# Patient Record
Sex: Male | Born: 1950 | Race: Black or African American | Hispanic: No | Marital: Married | Smoking: Never smoker
Health system: Southern US, Community
[De-identification: ages and names within clinical notes are randomized; demographics above are authoritative.]

## PROBLEM LIST (undated history)

## (undated) DIAGNOSIS — K219 Gastro-esophageal reflux disease without esophagitis: Secondary | ICD-10-CM

## (undated) DIAGNOSIS — D649 Anemia, unspecified: Secondary | ICD-10-CM

## (undated) DIAGNOSIS — I471 Supraventricular tachycardia, unspecified: Secondary | ICD-10-CM

## (undated) DIAGNOSIS — R7303 Prediabetes: Secondary | ICD-10-CM

## (undated) DIAGNOSIS — K311 Adult hypertrophic pyloric stenosis: Secondary | ICD-10-CM

## (undated) DIAGNOSIS — C801 Malignant (primary) neoplasm, unspecified: Secondary | ICD-10-CM

## (undated) HISTORY — PX: COLONOSCOPY: SHX174

---

## 1980-11-14 HISTORY — PX: OTHER SURGICAL HISTORY: SHX169

## 2002-01-12 ENCOUNTER — Encounter: Payer: Self-pay | Admitting: Emergency Medicine

## 2002-01-12 ENCOUNTER — Emergency Department (HOSPITAL_COMMUNITY): Admission: EM | Admit: 2002-01-12 | Discharge: 2002-01-12 | Payer: Self-pay | Admitting: Emergency Medicine

## 2002-06-05 ENCOUNTER — Ambulatory Visit (HOSPITAL_COMMUNITY): Admission: RE | Admit: 2002-06-05 | Discharge: 2002-06-05 | Payer: Self-pay | Admitting: General Surgery

## 2006-10-04 ENCOUNTER — Emergency Department (HOSPITAL_COMMUNITY): Admission: EM | Admit: 2006-10-04 | Discharge: 2006-10-04 | Payer: Self-pay | Admitting: Emergency Medicine

## 2007-04-16 ENCOUNTER — Ambulatory Visit (HOSPITAL_COMMUNITY): Admission: RE | Admit: 2007-04-16 | Discharge: 2007-04-16 | Payer: Self-pay | Admitting: Family Medicine

## 2011-04-01 NOTE — H&P (Signed)
[facility]  Patient:    [name], [name] Visit Number: [id number] MRN: [id number]          Service Type: Attending:  Elpidio Anis, M.D. Dictated by:   Elpidio Anis, M.D.                           History and Physical  HISTORY OF PRESENT ILLNESS:  A 60 year old male returning for screening colonoscopy.  He had no GI symptoms except nocturnal indigestion.  There is no nausea or vomiting.  He has not had any constipation.  PAST MEDICAL HISTORY:  Negative except for BPH.  REVIEW OF SYSTEMS:  Completely normal except for obesity.  FAMILY HISTORY:  Positive for prostate cancer.  CHRONIC MEDICATIONS:  None.  ALLERGIES:  None.  PHYSICAL EXAMINATION:  GENERAL:  He is an obese male in no acute distress.  VITAL SIGNS:  Blood pressure 128/84, pulse 80, respirations 18.  Weight 315 pounds.  HEENT:  Unremarkable.  NECK:  Supple without JVD or bruit.  CHEST:  Clear to auscultation.  BREASTS:  Mild bilateral gynecomastia.  ABDOMEN:  Obese, soft, nontender, no masses.  RECTAL:  Normal.  Stool guaiac negative.  EXTREMITIES:  No cyanosis, clubbing, or edema.  NEUROLOGIC:  No motor, sensory, or cerebellar deficits.  IMPRESSION:  Need for screening colonoscopy. Dictated by:   Elpidio Anis, M.D. Attending:  Elpidio Anis, M.D. DD:  06/04/02 TD:  06/04/02 Job: [id number]

## 2011-09-06 ENCOUNTER — Other Ambulatory Visit: Payer: Self-pay

## 2011-09-06 ENCOUNTER — Telehealth: Payer: Self-pay

## 2011-09-06 DIAGNOSIS — Z139 Encounter for screening, unspecified: Secondary | ICD-10-CM

## 2011-09-06 NOTE — Telephone Encounter (Signed)
Gastroenterology Pre-Procedure Form  Request Date: 09/06/2011      Requesting Physician: Dr. Mirna Mires      PATIENT INFORMATION:  [name] is a 60 y.o., male (DOB=July 28, 1951).  PROCEDURE: Procedure(s) requested: colonoscopy Procedure Reason: screening for colon cancer  PATIENT REVIEW QUESTIONS: The patient reports the following:   1. Diabetes Melitis: no 2. Joint replacements in the past 12 months: no 3. Major health problems in the past 3 months: no 4. Has an artificial valve or MVP:no 5. Has been advised in past to take antibiotics in advance of a procedure like teeth cleaning: no}    MEDICATIONS & ALLERGIES:    Patient reports the following regarding taking any blood thinners:   Plavix? no Aspirin?yes  Coumadin?  no  Patient confirms/reports the following medications:  Current Outpatient Prescriptions  Medication Sig Dispense Refill  . acetaminophen (TYLENOL) 325 MG tablet Take 650 mg by mouth every 6 (six) hours as needed. As needed, usually not over once a week for joint pain       . aspirin 81 MG tablet Take 81 mg by mouth daily.        [name] Kitchen ibuprofen (ADVIL,MOTRIN) 200 MG tablet Take 200 mg by mouth every 6 (six) hours as needed. As needed, usually not over once a week for joint pain         Patient confirms/reports the following allergies:  No Known Allergies  Patient is appropriate to schedule for requested procedure(s): yes  AUTHORIZATION INFORMATION Primary Insurance:   ID #:   Group #:  Pre-Cert / Auth required:  Pre-Cert / Auth #:   Secondary Insurance:   ID #:  Group #:  Pre-Cert / Auth required: Pre-Cert / Auth #:   No orders of the defined types were placed in this encounter.    SCHEDULE INFORMATION: Procedure has been scheduled as follows:  Date: 10/11/2011      Time: 10:30 AM  Location: [facility] Short Stay  This Gastroenterology Pre-Precedure Form is being routed to the following provider(s) for review: R. Roetta Sessions,  MD

## 2011-09-06 NOTE — Telephone Encounter (Signed)
Ok for colonoscopy.  ?

## 2011-09-07 NOTE — Telephone Encounter (Signed)
Rx and instructions mailed to pt.  

## 2011-09-26 ENCOUNTER — Telehealth: Payer: Self-pay

## 2011-09-26 NOTE — Telephone Encounter (Signed)
Tried to call pt to update triage prior to colonoscopy on 10/11/2011. Tried the [phone] and the [phone]. Neither numbers are working. Mailing a letter to call and update triage.

## 2011-10-03 ENCOUNTER — Telehealth: Payer: Self-pay

## 2011-10-03 NOTE — Telephone Encounter (Signed)
OK to proceed with colonoscopy.

## 2011-10-03 NOTE — Telephone Encounter (Signed)
Called pt to update triage. No new medical problems, and no new medications. He had not received the Rx and instructions in the mail. Faxed Rx and prescriptions to [facility].

## 2011-10-10 ENCOUNTER — Encounter (HOSPITAL_COMMUNITY): Payer: Self-pay | Admitting: Pharmacy Technician

## 2011-10-10 MED ORDER — SODIUM CHLORIDE 0.45 % IV SOLN
Freq: Once | INTRAVENOUS | Status: AC
  Administered 2011-10-11: 10:00:00 via INTRAVENOUS

## 2011-10-11 ENCOUNTER — Ambulatory Visit (HOSPITAL_COMMUNITY)
Admission: RE | Admit: 2011-10-11 | Discharge: 2011-10-11 | Disposition: A | Discharge status: Home / Self Care | Payer: BC Managed Care – PPO | Source: Ambulatory Visit | Attending: Internal Medicine | Admitting: Internal Medicine

## 2011-10-11 ENCOUNTER — Other Ambulatory Visit: Payer: Self-pay | Admitting: Internal Medicine

## 2011-10-11 ENCOUNTER — Encounter (HOSPITAL_COMMUNITY): Payer: Self-pay | Admitting: *Deleted

## 2011-10-11 ENCOUNTER — Encounter (HOSPITAL_COMMUNITY)
Admission: RE | Disposition: A | Discharge status: Home / Self Care | Payer: Self-pay | Source: Ambulatory Visit | Attending: Internal Medicine

## 2011-10-11 DIAGNOSIS — D126 Benign neoplasm of colon, unspecified: Secondary | ICD-10-CM

## 2011-10-11 DIAGNOSIS — K5730 Diverticulosis of large intestine without perforation or abscess without bleeding: Secondary | ICD-10-CM

## 2011-10-11 DIAGNOSIS — Z7982 Long term (current) use of aspirin: Secondary | ICD-10-CM | POA: Insufficient documentation

## 2011-10-11 DIAGNOSIS — Z1211 Encounter for screening for malignant neoplasm of colon: Secondary | ICD-10-CM | POA: Insufficient documentation

## 2011-10-11 DIAGNOSIS — Z139 Encounter for screening, unspecified: Secondary | ICD-10-CM

## 2011-10-11 HISTORY — PX: COLONOSCOPY: SHX5424

## 2011-10-11 SURGERY — COLONOSCOPY
Anesthesia: Moderate Sedation

## 2011-10-11 MED ORDER — MIDAZOLAM HCL 5 MG/5ML IJ SOLN
INTRAMUSCULAR | Status: DC | PRN
  Administered 2011-10-11 (×2): 2 mg via INTRAVENOUS
  Administered 2011-10-11: 1 mg via INTRAVENOUS

## 2011-10-11 MED ORDER — MEPERIDINE HCL 100 MG/ML IJ SOLN
INTRAMUSCULAR | Status: AC
  Filled 2011-10-11: qty 2

## 2011-10-11 MED ORDER — MEPERIDINE HCL 100 MG/ML IJ SOLN
INTRAMUSCULAR | Status: DC | PRN
  Administered 2011-10-11: 25 mg via INTRAVENOUS
  Administered 2011-10-11: 50 mg via INTRAVENOUS

## 2011-10-11 MED ORDER — STERILE WATER FOR IRRIGATION IR SOLN
Status: DC | PRN
  Administered 2011-10-11: 11:00:00

## 2011-10-11 MED ORDER — MIDAZOLAM HCL 5 MG/5ML IJ SOLN
INTRAMUSCULAR | Status: AC
  Filled 2011-10-11: qty 10

## 2011-10-11 NOTE — H&P (Signed)
  Primary Care Physician:  No primary provider on file. Primary Gastroenterologist:  Dr.   Pre-Procedure History & Physical: HPI:  [name] is a 60 y.o. male is here for a screening colonoscopy. Reported negative colonoscopy 10 years ago. No family history of colon cancer or colon polyps. No lower GI tract symptoms.  History reviewed. No pertinent past medical history.  Past Surgical History  Procedure Date  . Testicle removed 1982  . Colonoscopy     Prior to Admission medications   Medication Sig Start Date End Date Taking? Authorizing Provider  acetaminophen (TYLENOL) 325 MG tablet Take 650 mg by mouth every 6 (six) hours as needed. As needed, usually not over once a week for joint pain    Yes Historical Provider, MD  aspirin EC 81 MG tablet Take 81 mg by mouth daily.     Yes Historical Provider, MD  ibuprofen (ADVIL,MOTRIN) 200 MG tablet Take 200 mg by mouth every 6 (six) hours as needed. As needed, usually not over once a week for joint pain    Yes Historical Provider, MD  Multiple Vitamins-Minerals (MULTIVITAMINS THER. W/MINERALS) TABS Take 1 tablet by mouth daily.     Yes Historical Provider, MD    Allergies as of 09/06/2011  . (No Known Allergies)    Family History  Problem Relation Age of Onset  . Colon cancer Neg Hx     History   Social History  . Marital Status: Married    Spouse Name: N/A    Number of Children: N/A  . Years of Education: N/A   Occupational History  . Not on file.   Social History Main Topics  . Smoking status: Never Smoker   . Smokeless tobacco: Not on file  . Alcohol Use: 2.4 oz/week    2 Glasses of wine, 2 Cans of beer per week  . Drug Use: No  . Sexually Active:    Other Topics Concern  . Not on file   Social History Narrative  . No narrative on file    Review of Systems: See HPI, otherwise negative ROS  Physical Exam: BP 135/75  Pulse 76  Temp(Src) 98.2 F (36.8 C) (Oral)  Resp 18  Ht 6' (1.829 m)  Wt 300 lb  (136.079 kg)  BMI 40.69 kg/m2  SpO2 98% General:   Alert,  Well-developed, well-nourished, pleasant and cooperative in NAD Mouth:  No deformity or lesions, dentition normal. Neck:  Supple; no masses or thyromegaly. Lungs:  Clear throughout to auscultation.   No wheezes, crackles, or rhonchi. No acute distress. Heart:  Regular rate and rhythm; no murmurs, clicks, rubs,  or gallops. Abdomen:  Obese. Positive bowel sounds. Soft and nontender without appreciable mass or organomegaly. Impression/Plan: [name] is now here to undergo a screening colonoscopy.   Average risk examination.  Risks, benefits, limitations, imponderables and alternatives regarding colonoscopy have been reviewed with the patient. Questions have been answered. All parties agreeable.

## 2011-10-20 ENCOUNTER — Encounter (HOSPITAL_COMMUNITY): Payer: Self-pay | Admitting: Internal Medicine

## 2016-03-01 ENCOUNTER — Ambulatory Visit (HOSPITAL_COMMUNITY)
Admission: RE | Admit: 2016-03-01 | Discharge: 2016-03-01 | Disposition: A | Discharge status: Home / Self Care | Payer: BLUE CROSS/BLUE SHIELD | Source: Ambulatory Visit | Attending: Family Medicine | Admitting: Family Medicine

## 2016-03-01 ENCOUNTER — Other Ambulatory Visit (HOSPITAL_COMMUNITY): Payer: Self-pay | Admitting: Family Medicine

## 2016-03-01 DIAGNOSIS — R059 Cough, unspecified: Secondary | ICD-10-CM

## 2016-03-01 DIAGNOSIS — J188 Other pneumonia, unspecified organism: Secondary | ICD-10-CM

## 2016-03-01 DIAGNOSIS — Z029 Encounter for administrative examinations, unspecified: Secondary | ICD-10-CM | POA: Insufficient documentation

## 2016-03-01 DIAGNOSIS — R05 Cough: Secondary | ICD-10-CM

## 2016-12-19 ENCOUNTER — Ambulatory Visit: Payer: BLUE CROSS/BLUE SHIELD | Admitting: Gastroenterology

## 2016-12-26 ENCOUNTER — Other Ambulatory Visit: Payer: Self-pay | Admitting: Gastroenterology

## 2016-12-26 ENCOUNTER — Ambulatory Visit
Admission: RE | Admit: 2016-12-26 | Discharge: 2016-12-26 | Disposition: A | Discharge status: Home / Self Care | Payer: BLUE CROSS/BLUE SHIELD | Source: Ambulatory Visit | Attending: Gastroenterology | Admitting: Gastroenterology

## 2016-12-26 DIAGNOSIS — R1013 Epigastric pain: Secondary | ICD-10-CM

## 2016-12-26 DIAGNOSIS — R634 Abnormal weight loss: Secondary | ICD-10-CM

## 2016-12-26 MED ORDER — IOPAMIDOL (ISOVUE-300) INJECTION 61%
125.0000 mL | Freq: Once | INTRAVENOUS | Status: AC | PRN
  Administered 2016-12-26: 125 mL via INTRAVENOUS

## 2016-12-27 ENCOUNTER — Other Ambulatory Visit: Payer: Self-pay | Admitting: Gastroenterology

## 2016-12-28 ENCOUNTER — Encounter (HOSPITAL_COMMUNITY): Payer: Self-pay | Admitting: *Deleted

## 2016-12-30 ENCOUNTER — Ambulatory Visit (HOSPITAL_COMMUNITY)
Admission: RE | Admit: 2016-12-30 | Discharge: 2016-12-30 | Disposition: A | Discharge status: Home / Self Care | Payer: BLUE CROSS/BLUE SHIELD | Source: Ambulatory Visit | Attending: Gastroenterology | Admitting: Gastroenterology

## 2016-12-30 ENCOUNTER — Encounter (HOSPITAL_COMMUNITY): Payer: Self-pay

## 2016-12-30 ENCOUNTER — Encounter (HOSPITAL_COMMUNITY)
Admission: RE | Disposition: A | Discharge status: Home / Self Care | Payer: Self-pay | Source: Ambulatory Visit | Attending: Gastroenterology

## 2016-12-30 ENCOUNTER — Ambulatory Visit (HOSPITAL_COMMUNITY): Payer: BLUE CROSS/BLUE SHIELD | Admitting: Anesthesiology

## 2016-12-30 DIAGNOSIS — Z6839 Body mass index (BMI) 39.0-39.9, adult: Secondary | ICD-10-CM | POA: Diagnosis not present

## 2016-12-30 DIAGNOSIS — E119 Type 2 diabetes mellitus without complications: Secondary | ICD-10-CM | POA: Diagnosis not present

## 2016-12-30 DIAGNOSIS — C164 Malignant neoplasm of pylorus: Secondary | ICD-10-CM | POA: Diagnosis not present

## 2016-12-30 DIAGNOSIS — Z79899 Other long term (current) drug therapy: Secondary | ICD-10-CM | POA: Insufficient documentation

## 2016-12-30 DIAGNOSIS — Z7982 Long term (current) use of aspirin: Secondary | ICD-10-CM | POA: Insufficient documentation

## 2016-12-30 DIAGNOSIS — K219 Gastro-esophageal reflux disease without esophagitis: Secondary | ICD-10-CM | POA: Diagnosis not present

## 2016-12-30 DIAGNOSIS — E669 Obesity, unspecified: Secondary | ICD-10-CM | POA: Insufficient documentation

## 2016-12-30 DIAGNOSIS — K311 Adult hypertrophic pyloric stenosis: Secondary | ICD-10-CM | POA: Insufficient documentation

## 2016-12-30 DIAGNOSIS — K259 Gastric ulcer, unspecified as acute or chronic, without hemorrhage or perforation: Secondary | ICD-10-CM | POA: Insufficient documentation

## 2016-12-30 HISTORY — PX: ESOPHAGOGASTRODUODENOSCOPY: SHX5428

## 2016-12-30 LAB — GLUCOSE, CAPILLARY: Glucose-Capillary: 85 mg/dL (ref 65–99)

## 2016-12-30 SURGERY — EGD (ESOPHAGOGASTRODUODENOSCOPY)
Anesthesia: General

## 2016-12-30 MED ORDER — FENTANYL CITRATE (PF) 100 MCG/2ML IJ SOLN
INTRAMUSCULAR | Status: DC | PRN
  Administered 2016-12-30 (×2): 50 ug via INTRAVENOUS

## 2016-12-30 MED ORDER — FENTANYL CITRATE (PF) 100 MCG/2ML IJ SOLN
INTRAMUSCULAR | Status: AC
  Filled 2016-12-30: qty 2

## 2016-12-30 MED ORDER — ONDANSETRON HCL 4 MG/2ML IJ SOLN
INTRAMUSCULAR | Status: DC | PRN
  Administered 2016-12-30: 4 mg via INTRAVENOUS

## 2016-12-30 MED ORDER — PROPOFOL 10 MG/ML IV BOLUS
INTRAVENOUS | Status: DC | PRN
  Administered 2016-12-30: 180 mg via INTRAVENOUS

## 2016-12-30 MED ORDER — PROPOFOL 10 MG/ML IV BOLUS
INTRAVENOUS | Status: AC
  Filled 2016-12-30: qty 20

## 2016-12-30 MED ORDER — SUCCINYLCHOLINE CHLORIDE 200 MG/10ML IV SOSY
PREFILLED_SYRINGE | INTRAVENOUS | Status: DC | PRN
  Administered 2016-12-30: 160 mg via INTRAVENOUS

## 2016-12-30 MED ORDER — LACTATED RINGERS IV SOLN
INTRAVENOUS | Status: DC | PRN
  Administered 2016-12-30: 12:00:00 via INTRAVENOUS

## 2016-12-30 MED ORDER — MIDAZOLAM HCL 2 MG/2ML IJ SOLN
INTRAMUSCULAR | Status: AC
  Filled 2016-12-30: qty 2

## 2016-12-30 MED ORDER — LIDOCAINE 2% (20 MG/ML) 5 ML SYRINGE
INTRAMUSCULAR | Status: DC | PRN
  Administered 2016-12-30: 50 mg via INTRAVENOUS

## 2016-12-30 MED ORDER — MIDAZOLAM HCL 5 MG/5ML IJ SOLN
INTRAMUSCULAR | Status: DC | PRN
  Administered 2016-12-30: 2 mg via INTRAVENOUS

## 2016-12-30 NOTE — Discharge Instructions (Signed)

## 2016-12-30 NOTE — Anesthesia Preprocedure Evaluation (Signed)
Anesthesia Evaluation  Patient identified by MRN, date of birth, ID band Patient awake    Reviewed: Allergy & Precautions, NPO status , Patient's Chart, lab work & pertinent test results  Airway Mallampati: II  TM Distance: >3 FB Neck ROM: Full    Dental  (+) Teeth Intact, Dental Advisory Given   Pulmonary neg pulmonary ROS,    Pulmonary exam normal breath sounds clear to auscultation       Cardiovascular Exercise Tolerance: Good negative cardio ROS Normal cardiovascular exam Rhythm:Regular Rate:Normal     Neuro/Psych negative neurological ROS  negative psych ROS   GI/Hepatic Neg liver ROS, PUD, GERD  Medicated,Gastric outlet obstruction   Endo/Other  diabetes, Type 2Obesity   Renal/GU negative Renal ROS     Musculoskeletal negative musculoskeletal ROS (+)   Abdominal   Peds  Hematology negative hematology ROS (+)   Anesthesia Other Findings Day of surgery medications reviewed with the patient.  Reproductive/Obstetrics                             Anesthesia Physical Anesthesia Plan  ASA: II  Anesthesia Plan: General   Post-op Pain Management:    Induction: Intravenous  Airway Management Planned: Oral ETT  Additional Equipment:   Intra-op Plan:   Post-operative Plan: Extubation in OR  Informed Consent: I have reviewed the patients History and Physical, chart, labs and discussed the procedure including the risks, benefits and alternatives for the proposed anesthesia with the patient or authorized representative who has indicated his/her understanding and acceptance.   Dental advisory given  Plan Discussed with: CRNA and Anesthesiologist  Anesthesia Plan Comments: (Risks/benefits of general anesthesia discussed with patient including risk of damage to teeth, lips, gum, and tongue, nausea/vomiting, allergic reactions to medications, and the possibility of heart attack, stroke  and death.  All patient questions answered.  Patient wishes to proceed.)        Anesthesia Quick Evaluation

## 2016-12-30 NOTE — H&P (Signed)
  [name] HPI: This is a 66 year old male with who has a gastric outlet obstruction.  An EGD was performed earlier this week in the [place] setting and a full stomach was encountered.  A CT scan confirmed the suspicion of a GOO, but the etiology of the GOO was unknown, i.e., malignant versus benign.  History reviewed. No pertinent past medical history.  Past Surgical History:  Procedure Laterality Date  . COLONOSCOPY    . COLONOSCOPY  10/11/2011   Procedure: COLONOSCOPY;  Surgeon: Daneil Dolin, MD;  Location: [facility];  Service: Endoscopy;  Laterality: N/A;  10:30 AM  . testicle removed  1982    Family History  Problem Relation Age of Onset  . Colon cancer Neg Hx     Social History:  reports that he has never smoked. He has never used smokeless tobacco. He reports that he drinks about 2.4 oz of alcohol per week . He reports that he does not use drugs.  Allergies: No Known Allergies  Medications: Scheduled: Continuous:  Results for orders placed or performed during the hospital encounter of 12/30/16 (from the past 24 hour(s))  Glucose, capillary     Status: None   Collection Time: 12/30/16 11:51 AM  Result Value Ref Range   Glucose-Capillary 85 65 - 99 mg/dL     No results found.  ROS:  As stated above in the HPI otherwise negative.  Blood pressure (!) 151/69, pulse 81, temperature 97.5 F (36.4 C), temperature source Oral, resp. rate 16, height 6\' 1"  (1.854 m), weight 136.1 kg (300 lb), SpO2 100 %.    PE: Gen: NAD, Alert and Oriented HEENT:  [place]/AT, EOMI Neck: Supple, no LAD Lungs: CTA Bilaterally CV: RRR without M/G/R ABM: Soft, NTND, +BS Ext: No C/C/E  Assessment/Plan: 1) GOO - EGD with General anesthesia.  Aiman Noe D 12/30/2016, 12:30 PM

## 2016-12-30 NOTE — Anesthesia Procedure Notes (Signed)
Procedure Name: Intubation Date/Time: 12/30/2016 12:30 PM Performed by: Anne Fu Pre-anesthesia Checklist: Patient identified, Emergency Drugs available, Suction available, Patient being monitored and Timeout performed Patient Re-evaluated:Patient Re-evaluated prior to inductionOxygen Delivery Method: Circle system utilized Preoxygenation: Pre-oxygenation with 100% oxygen Intubation Type: Cricoid Pressure applied, Rapid sequence and IV induction Laryngoscope Size: Mac and 4 Grade View: Grade II Tube type: Oral Tube size: 7.5 mm Number of attempts: 1 Airway Equipment and Method: Bougie stylet Placement Confirmation: ETT inserted through vocal cords under direct vision,  positive ETCO2,  CO2 detector and breath sounds checked- equal and bilateral Secured at: 21 cm Tube secured with: Tape Dental Injury: Teeth and Oropharynx as per pre-operative assessment

## 2016-12-30 NOTE — Op Note (Signed)
[facility] Patient Name: [name] Procedure Date: 12/30/2016 MRN: [id number] Attending MD: Carol Ada , MD Date of Birth: 07-23-1951 CSN: [id number] Age: 66 Admit Type: Outpatient Procedure:                Upper GI endoscopy Indications:              Pyloric stenosis Providers:                Carol Ada, MD, Laverta Baltimore RN, RN, Hilma Favors, RN, Elspeth Cho Tech., Technician Referring MD:              Medicines:                Propofol per Anesthesia Complications:            No immediate complications. Estimated Blood Loss:     Estimated blood loss was minimal. Procedure:                Pre-Anesthesia Assessment:                           - Prior to the procedure, a History and Physical                            was performed, and patient medications and                            allergies were reviewed. The patient's tolerance of                            previous anesthesia was also reviewed. The risks                            and benefits of the procedure and the sedation                            options and risks were discussed with the patient.                            All questions were answered, and informed consent                            was obtained. Prior Anticoagulants: The patient has                            taken no previous anticoagulant or antiplatelet                            agents. ASA Grade Assessment: II - A patient with                            mild systemic disease. After reviewing the risks  and benefits, the patient was deemed in                            satisfactory condition to undergo the procedure.                           - Sedation was administered by an anesthesia                            professional. Deep sedation was attained.                           After obtaining informed consent, the endoscope was                            passed  under direct vision. Throughout the                            procedure, the patient's blood pressure, pulse, and                            oxygen saturations were monitored continuously. The                            was introduced through the mouth, and advanced to                            the duodenal bulb. The upper GI endoscopy was                            technically difficult and complex. The patient                            tolerated the procedure well. Scope In: Scope Out: Findings:      The esophagus was normal.      A large amount of food (residue) was found in the gastric body.      A malignant-appearing, intrinsic severe stenosis was found at the       pylorus. This was traversed. Biopsies were taken with a cold forceps for       histology.      The examined duodenum was normal.      At the pylorus there is a high suspicion of a malignant mass. It can       still be severe ulcer disease as there was ulceration noted in the       pyloric channel. The pyloric channel was only traversed with the       pediatric endoscope. The stenosis was severe, but passage of the       endoscope did open up the pylorus. Impression:               - Normal esophagus.                           - A large amount of food (residue) in the stomach.                           -  Gastric stenosis was found at the pylorus.                            Biopsied.                           - Normal examined duodenum. Moderate Sedation:      N/A- Per Anesthesia Care Recommendation:           - Patient has a contact number available for                            emergencies. The signs and symptoms of potential                            delayed complications were discussed with the                            patient. Return to normal activities tomorrow.                            Written discharge instructions were provided to the                            patient.                           -  Clear liquid diet.                           - Continue present medications.                           - Await pathology results.                           - I will discuss with the patient about admission                            to the hospital with an inpatient surgical                            consultation. Procedure Code(s):        --- Professional ---                           [phone], Esophagogastroduodenoscopy, flexible,                            transoral; with biopsy, single or multiple Diagnosis Code(s):        --- Professional ---                           K31.1, Adult hypertrophic pyloric stenosis CPT copyright 2016 [employer]. All rights reserved. The codes documented in this report are preliminary and upon coder review may  be revised to meet current compliance requirements. Carol Ada, MD Carol Ada, MD 12/30/2016 12:54:30  PM This report has been signed electronically. Number of Addenda: 0

## 2016-12-30 NOTE — Transfer of Care (Signed)
Immediate Anesthesia Transfer of Care Note  Patient: [name]  Procedure(s) Performed: Procedure(s): ESOPHAGOGASTRODUODENOSCOPY (EGD) (N/A)  Patient Location: PACU  Anesthesia Type:General  Level of Consciousness:  sedated, patient cooperative and responds to stimulation  Airway & Oxygen Therapy:Patient Spontanous Breathing and Patient connected to face mask oxgen  Post-op Assessment:  Report given to PACU RN and Post -op Vital signs reviewed and stable  Post vital signs:  Reviewed and stable  Last Vitals:  Vitals:   12/30/16 1202  BP: (!) 151/69  Pulse: 81  Resp: 16  Temp: [id number]    Complications: No apparent anesthesia complications

## 2017-01-01 NOTE — Anesthesia Postprocedure Evaluation (Signed)
Anesthesia Post Note  Patient: [name]  Procedure(s) Performed: Procedure(s) (LRB): ESOPHAGOGASTRODUODENOSCOPY (EGD) (N/A)  Patient location during evaluation: Endoscopy Anesthesia Type: General Level of consciousness: awake and alert Pain management: pain level controlled Vital Signs Assessment: post-procedure vital signs reviewed and stable Respiratory status: spontaneous breathing, nonlabored ventilation, respiratory function stable and patient connected to nasal cannula oxygen Cardiovascular status: blood pressure returned to baseline and stable Postop Assessment: no signs of nausea or vomiting Anesthetic complications: no       Last Vitals:  Vitals:   12/30/16 1202 12/30/16 1302  BP: (!) 151/69 (!) 133/57  Pulse: 81 (!) 58  Resp: 16 10  Temp: 36.4 C 36.6 C    Last Pain:  Vitals:   12/30/16 1340  TempSrc: Oral                 Catalina Gravel

## 2017-01-02 ENCOUNTER — Encounter (HOSPITAL_COMMUNITY): Payer: Self-pay | Admitting: Gastroenterology

## 2017-01-04 ENCOUNTER — Other Ambulatory Visit: Payer: Self-pay | Admitting: General Surgery

## 2017-01-06 ENCOUNTER — Telehealth: Payer: Self-pay | Admitting: Hematology

## 2017-01-06 NOTE — Telephone Encounter (Signed)
Appt has been scheduled for the pt to see Dr. Irene Limbo on 2/26 at 830am. Pt aware to arrive 30 minutes early. Demographics verified. Pt agreed to the appt date and time.

## 2017-01-09 ENCOUNTER — Encounter (HOSPITAL_COMMUNITY)
Admission: RE | Admit: 2017-01-09 | Discharge: 2017-01-09 | Disposition: A | Discharge status: Home / Self Care | Payer: BLUE CROSS/BLUE SHIELD | Source: Ambulatory Visit | Attending: General Surgery | Admitting: General Surgery

## 2017-01-09 ENCOUNTER — Ambulatory Visit (HOSPITAL_BASED_OUTPATIENT_CLINIC_OR_DEPARTMENT_OTHER): Payer: BLUE CROSS/BLUE SHIELD | Admitting: Hematology

## 2017-01-09 ENCOUNTER — Encounter: Payer: Self-pay | Admitting: Hematology

## 2017-01-09 ENCOUNTER — Encounter (HOSPITAL_COMMUNITY): Payer: Self-pay

## 2017-01-09 VITALS — BP 131/60 | HR 79 | Temp 98.8°F | Resp 18 | Ht 70.0 in | Wt 270.0 lb

## 2017-01-09 DIAGNOSIS — E440 Moderate protein-calorie malnutrition: Secondary | ICD-10-CM

## 2017-01-09 DIAGNOSIS — C169 Malignant neoplasm of stomach, unspecified: Secondary | ICD-10-CM | POA: Diagnosis not present

## 2017-01-09 DIAGNOSIS — E669 Obesity, unspecified: Secondary | ICD-10-CM

## 2017-01-09 HISTORY — DX: Anemia, unspecified: D64.9

## 2017-01-09 HISTORY — DX: Gastro-esophageal reflux disease without esophagitis: K21.9

## 2017-01-09 HISTORY — DX: Prediabetes: R73.03

## 2017-01-09 LAB — CBC WITH DIFFERENTIAL/PLATELET
Basophils Absolute: 0 10*3/uL (ref 0.0–0.1)
Basophils Relative: 0 %
Eosinophils Absolute: 0.1 10*3/uL (ref 0.0–0.7)
Eosinophils Relative: 1 %
HCT: 37.5 % — ABNORMAL LOW (ref 39.0–52.0)
Hemoglobin: 11.9 g/dL — ABNORMAL LOW (ref 13.0–17.0)
Lymphocytes Relative: 21 %
Lymphs Abs: 1.3 10*3/uL (ref 0.7–4.0)
MCH: 25.5 pg — ABNORMAL LOW (ref 26.0–34.0)
MCHC: 31.7 g/dL (ref 30.0–36.0)
MCV: 80.3 fL (ref 78.0–100.0)
Monocytes Absolute: 0.5 10*3/uL (ref 0.1–1.0)
Monocytes Relative: 8 %
Neutro Abs: 4.3 10*3/uL (ref 1.7–7.7)
Neutrophils Relative %: 70 %
Platelets: 221 10*3/uL (ref 150–400)
RBC: 4.67 MIL/uL (ref 4.22–5.81)
RDW: 15.4 % (ref 11.5–15.5)
WBC: 6.1 10*3/uL (ref 4.0–10.5)

## 2017-01-09 LAB — URINALYSIS, ROUTINE W REFLEX MICROSCOPIC
Bacteria, UA: NONE SEEN
Glucose, UA: NEGATIVE mg/dL
Hgb urine dipstick: NEGATIVE
Ketones, ur: 80 mg/dL — AB
Leukocytes, UA: NEGATIVE
Nitrite: NEGATIVE
Protein, ur: 30 mg/dL — AB
Specific Gravity, Urine: 1.035 — ABNORMAL HIGH (ref 1.005–1.030)
pH: 5 (ref 5.0–8.0)

## 2017-01-09 LAB — COMPREHENSIVE METABOLIC PANEL
ALT: 34 U/L (ref 17–63)
AST: 23 U/L (ref 15–41)
Albumin: 3.8 g/dL (ref 3.5–5.0)
Alkaline Phosphatase: 52 U/L (ref 38–126)
Anion gap: 11 (ref 5–15)
BUN: 12 mg/dL (ref 6–20)
CO2: 24 mmol/L (ref 22–32)
Calcium: 10 mg/dL (ref 8.9–10.3)
Chloride: 102 mmol/L (ref 101–111)
Creatinine, Ser: 1.11 mg/dL (ref 0.61–1.24)
GFR calc Af Amer: >60 mL/min (ref >60)
GFR calc non Af Amer: >60 mL/min (ref >60)
Glucose, Bld: 111 mg/dL — ABNORMAL HIGH (ref 65–99)
Potassium: 3.8 mmol/L (ref 3.5–5.1)
Sodium: 137 mmol/L (ref 135–145)
Total Bilirubin: 1 mg/dL (ref 0.3–1.2)
Total Protein: 7.4 g/dL (ref 6.5–8.1)

## 2017-01-09 LAB — TYPE AND SCREEN
ABO/RH(D): A POS
Antibody Screen: NEGATIVE

## 2017-01-09 LAB — GLUCOSE, CAPILLARY: Glucose-Capillary: 106 mg/dL — ABNORMAL HIGH (ref 65–99)

## 2017-01-09 LAB — PROTIME-INR
INR: 1.16
Prothrombin Time: 14.9 seconds (ref 11.4–15.2)

## 2017-01-09 MED ORDER — DEXTROSE 5 % IV SOLN
3.0000 g | INTRAVENOUS | Status: AC
  Administered 2017-01-10: 3 g via INTRAVENOUS
  Filled 2017-01-09: qty 3000

## 2017-01-09 NOTE — Progress Notes (Signed)
[name] Kitchen    HEMATOLOGY/ONCOLOGY CONSULTATION NOTE  Date of Service: 01/09/2017  Patient Care Team: [name], MD as PCP - General (Internal Medicine) Stark Klein (Surgery) Elisha Headland (Gastroenterology)  CHIEF COMPLAINTS/PURPOSE OF CONSULTATION:  Newly diagnosed gastric adenocarcinoma with partial gastric outlet obstruction.  HISTORY OF PRESENTING ILLNESS:   [name] is a wonderful 66 y.o. male who has been referred to [name] by Dr .[name], MD  for evaluation and management of newly diagnosed gastric adenocarcinoma.  Patient is a history of obesity, shingles, prediabetes but no other significant chronic medical issues who presented with early satiety initially noted around Thanksgiving in 2017. He notes that he developed progressive dyspeptic symptoms, acid reflux and abdominal fullness after meals. He was initially treated with Zantac without any benefits and then treated empirically for Helicobacter pylori infection by his primary care physician. He lost about 60 pounds over the last 2-3 months. Since his symptoms persisted he was eventually referred to gastroenterology and had a CT of the chest abdomen pelvis on 12/26/2016 which showed prominent distention of the stomach with food debris. Mild annular wall thickening in the gastric antrum. No significant perigastric lymphadenopathy noted.  Patient had an EGD on 12/30/2016 which showed malignant appearing intrinsic severe stenosis at the gastric pylorus. This was transversed with a pediatric endoscope. Stenosis was noted to be severe. Biopsy was taken of the ulceration at the pylorus. Biopsy was consistent with adenocarcinoma of the stomach along with marked acute and chronic inflammation. Warthin Starry stain was negative for Helicobacter pylori.  Patient has been scheduled for a distal gastrectomy with feeding tube placement by Dr. Jeris Penta tomorrow on 01/10/2017.  Notes that he is continuing to lose weight at this  time. Abdominal distention was somewhat better after the procedure for a day or 2 but is, in fact again it at has intermittent hiccups.  MEDICAL HISTORY:   1) Obesity .Body mass index is 38.74 kg/m. 2) h/o Shingles 3 yrs ago on chest 3) Borderline diabetes - was previously on metformin   SURGICAL HISTORY: Past Surgical History:  Procedure Laterality Date  . COLONOSCOPY    . COLONOSCOPY  10/11/2011   Procedure: COLONOSCOPY;  Surgeon: Daneil Dolin, MD;  [facility];  Service: Endoscopy;  Laterality: N/A;  10:30 AM  . ESOPHAGOGASTRODUODENOSCOPY N/A 12/30/2016   Procedure: ESOPHAGOGASTRODUODENOSCOPY (EGD);  Surgeon: Carol Ada, MD;  Location: [facility];  Service: Endoscopy;  Laterality: N/A;  . testicle removed  1982    SOCIAL HISTORY: Social History   Social History  . Marital status: Married    Spouse name: N/A  . Number of children: N/A  . Years of education: N/A   Occupational History  . Not on file.   Social History Main Topics  . Smoking status: Never Smoker  . Smokeless tobacco: Never Used  . Alcohol use 2.4 oz/week    2 Glasses of wine, 2 Cans of beer per week  . Drug use: No  . Sexual activity: Not on file   Other Topics Concern  . Not on file   Social History Narrative  . 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 No radiation exposure Previously has lived in [place], [place] and [place].  FAMILY HISTORY: Family History  Problem Relation Age of Onset  . Colon cancer Neg Hx   No family history of blood disorders. Father had prostate cancer and died at  age 32 years.  ALLERGIES:  has No Known Allergies.  MEDICATIONS:  Current Outpatient Prescriptions  Medication Sig Dispense Refill  . ferrous sulfate 325 (65 FE) MG tablet Take 325 mg by mouth daily with breakfast.    . Multiple Vitamins-Minerals (MULTIVITAMINS THER.  W/MINERALS) TABS Take 1 tablet by mouth daily.      . ranitidine (ZANTAC) 150 MG tablet Take 150 mg by mouth daily as needed for heartburn.     No current facility-administered medications for this visit.     REVIEW OF SYSTEMS:    10 Point review of Systems was done is negative except as noted above.  PHYSICAL EXAMINATION: ECOG PERFORMANCE STATUS: 1 - Symptomatic but completely ambulatory  . Vitals:   01/09/17 0854  BP: 131/60  Pulse: 79  Resp: 18  Temp: 98.8 F (37.1 C)   Filed Weights   01/09/17 0854  Weight: 270 lb (122.5 kg)   .Body mass index is 38.74 kg/m.  GENERAL:alert, in no acute distress and comfortable SKIN: skin color, texture, turgor are normal, no rashes or significant lesions EYES: normal, conjunctiva are pink and non-injected, sclera clear OROPHARYNX:no exudate, no erythema and lips, buccal mucosa, and tongue normal  NECK: supple, no JVD, thyroid normal size, non-tender, without nodularity LYMPH:  no palpable lymphadenopathy in the cervical, axillary or inguinal LUNGS: clear to auscultation with normal respiratory effort HEART: regular rate & rhythm,  no murmurs and no lower extremity edema ABDOMEN: abdomen obese, not overtly distended, non-tender, normoactive bowel sounds  Musculoskeletal: no cyanosis of digits and no clubbing  PSYCH: alert & oriented x 3 with fluent speech NEURO: no focal motor/sensory deficits  LABORATORY DATA:  I have reviewed the data as listed    EGD: 12/30/2016 - Normal esophagus. - A large amount of food (residue) in the stomach. - Gastric stenosis was found at the pylorus. Biopsied. - Normal examined duodenum.  RADIOGRAPHIC STUDIES: I have personally reviewed the radiological images as listed and agreed with the findings in the report. Ct Abdomen Pelvis W Contrast  Result Date: 12/26/2016 CLINICAL DATA:  66 year old male with epigastric abdominal pain and abnormal weight loss. Reported incomplete colonoscopy earlier  today. Reported upper endoscopy 11/25/2016. EXAM: CT ABDOMEN AND PELVIS WITH CONTRAST TECHNIQUE: Multidetector CT imaging of the abdomen and pelvis was performed using the standard protocol following bolus administration of intravenous contrast. CONTRAST:  178mL ISOVUE-300 IOPAMIDOL (ISOVUE-300) INJECTION 61% COMPARISON:  None. FINDINGS: Lower chest: No significant pulmonary nodules or acute consolidative airspace disease. Coronary atherosclerosis. Hepatobiliary: Normal liver with no liver mass. Normal gallbladder with no radiopaque cholelithiasis. No biliary ductal dilatation. Pancreas: Normal, with no mass or duct dilation. Spleen: Normal size. No mass. Adrenals/Urinary Tract: Normal adrenals. No hydronephrosis. Simple appearing 1.0 cm renal cyst in the anterior interpolar right kidney. Normal bladder. Stomach/Bowel: There is prominent distention of the stomach with food debris. There is mild annular wall thickening in the gastric antrum (series 3/ image 39). Normal caliber small bowel with no small bowel wall thickening. Normal appendix. Minimal sigmoid diverticulosis. Fluid levels throughout the nondistended large bowel. No large bowel wall thickening or pericolonic fat stranding. Vascular/Lymphatic: Normal caliber abdominal aorta. Patent portal, splenic, hepatic and renal veins. No pathologically enlarged lymph nodes in the abdomen or pelvis. Reproductive: Mildly enlarged prostate. Other: No pneumoperitoneum, ascites or focal fluid collection. Partially visualized postsurgical changes from right orchiectomy. Musculoskeletal: No aggressive appearing focal osseous lesions. Moderate thoracolumbar spondylosis. IMPRESSION: 1. Prominent distention of the stomach with food debris. Mild annular wall  thickening in the gastric antrum, which is highly nonspecific and could be artifactual due to gastric peristalsis, with gastritis or gastric neoplasm not excluded. Recommend correlation with the reported recent upper  endoscopy. 2. Nonspecific fluid levels throughout the nondistended large bowel, potentially related to the reported colonoscopy performed earlier today. No free air. No findings to suggest small or large bowel obstruction or acute small or large bowel inflammation. 3. Coronary atherosclerosis. 4. Mildly enlarged prostate. Electronically Signed   By: Ilona Sorrel M.D.   On: 12/26/2016 15:01    ASSESSMENT & PLAN:   65 year old African-American male with  1) Newly diagnosed Gastric Adenocarcinoma  Presented with severe pyloric stenosis with ulceration. 60 pound weight loss. Patient still has symptomatic pyloric stenosis related to his cancer. Initial CT abdomen and pelvis do not show any local regional lymphadenopathy. No endoscopic ultrasound staging done at this point.  #2 moderate protein calorie malnutrition patient has lost about 60 pounds in the last 2-3 months. Plan -Given the patient's symptomatic pyloric stenosis he has been scheduled for urgent surgery with Dr. Jeris Penta on 01/10/2017 . Likely distal gastrectomy with feeding tube placement. Surgery might also involve local lymph node staging. -Dietitian referral for nutritional optimization after surgery. -Baseline labs including CBC, CMP, ferritin, iron profile, B12 have been requested. Patient prefers to have his preop labs tomorrow and not have his baseline labs in clinic today. -Will need a CT of the chest to complete staging. -Further recommendations for adjuvant chemotherapy plus or minus radiation based on pathology results after surgery/surgical staging.  #3 pre-diabetes previously was on metformin. Blood sugar stable after having lost 60 pounds of weight.  #4 suspected sleep apnea. Has not had a sleep study. Wife notes significant snoring which is important with weight loss.  Will have to be careful with excessive sedation.  #5 obesity - has lost about 60 pounds since his symptoms started in November 2017.  Return to  care with Dr. Irene Limbo with labs in 2-3 weeks after surgery to discuss role for adjuvant treatments.  All of the patients questions were answered with apparent satisfaction. The patient knows to call the clinic with any problems, questions or concerns.  I spent 50 minutes counseling the patient face to face. The total time spent in the appointment was 60 minutes and more than 50% was on counseling and direct patient cares.    Sullivan Lone MD [place] AAHIVMS [facility] Hematology/Oncology Physician [facility]  (Office):       [phone] (Work cell):  [phone] (Fax):           [phone]  01/09/2017 9:06 AM

## 2017-01-09 NOTE — Progress Notes (Signed)
EKG requested from Dr. [name] Osei-Bonsu  Pt.'s PCP

## 2017-01-09 NOTE — Pre-Procedure Instructions (Signed)
[name]  01/09/2017      [id number] - [place], [place] [id number] [place] [address] Phone: [phone] Fax: [phone]    Your procedure is scheduled on 01-10-2017   Tuesday   Report to [facility] Admitting at 5:30 A.M.   Call this number if you have problems the morning of surgery:  [phone]   Remember:  Do not eat food or drink liquids after midnight.   Take these medicines the morning of surgery with A SIP OF WATER ranitidine(Zantac ) if needed         DRINK 1 BOOST DRINK 2 HOURS PRIOR TO ARRIVAL                 STOP ASPIRIN,ANTIINFLAMATORIES (IBUPROFEN,ALEVE,MOTRIN,ADVIL,GOODY'S POWDERS),HERBAL SUPPLEMENTS,FISH OIL,AND VITAMINS 5-7 DAYS PRIOR TO SURGERY   Do not wear jewelry,   Do not wear lotions, powders, or perfumes, or deoderant.  Do not shave 48 hours prior to surgery.  Men may shave face and neck.   Do not bring valuables to the hospital.  [facility] is not responsible for any belongings or valuables.  Contacts, dentures or bridgework may not be worn into surgery.  Leave your suitcase in the car.  After surgery it may be brought to your room.  For patients admitted to the hospital, discharge time will be determined by your treatment team.  Patients discharged the day of surgery will not be allowed to drive home.        Special Instructions: [name] - Preparing for Surgery  Before surgery, you can play an important role.  Because skin is not sterile, your skin needs to be as free of germs as possible.  You can reduce the number of germs on you skin by washing with CHG (chlorahexidine gluconate) soap before surgery.  CHG is an antiseptic cleaner which kills germs and bonds with the skin to continue killing germs even after washing.  Please DO NOT use if you have an allergy to CHG or antibacterial soaps.  If your skin becomes reddened/irritated stop using the CHG and inform  your nurse when you arrive at Short Stay.  Do not shave (including legs and underarms) for at least 48 hours prior to the first CHG shower.  You may shave your face.  Please follow these instructions carefully:   1.  Shower with CHG Soap the night before surgery and the   morning of Surgery.  2.  If you choose to wash your hair, wash your hair first as usual with your normal shampoo.  3.  After you shampoo, rinse your hair and body thoroughly to remove the  Shampoo.  4.  Use CHG as you would any other liquid soap.  You can apply chg directly  to the skin and wash gently with scrungie or a clean washcloth.  5.  Apply the CHG Soap to your body ONLY FROM THE NECK DOWN.   Do not use on open wounds or open sores.  Avoid contact with your eyes,  ears, mouth and genitals (private parts).  Wash genitals (private parts) with your normal soap.  6.  Wash thoroughly, paying special attention to the area where your surgery will be performed.  7.  Thoroughly rinse your body with warm water from the neck down.  8.  DO NOT shower/wash with your normal soap after using and rinsing o  the CHG Soap.  9.  Pat yourself dry  with a clean towel.            10.  Wear clean pajamas.            11.  Place clean sheets on your bed the night of your first shower and do not sleep with pets.  Day of Surgery  Do not apply any lotions/deodorants the morning of surgery.  Please wear clean clothes to the hospital/surgery center.      [name] Kitchen

## 2017-01-10 ENCOUNTER — Encounter (HOSPITAL_COMMUNITY): Payer: Self-pay | Admitting: *Deleted

## 2017-01-10 ENCOUNTER — Inpatient Hospital Stay (HOSPITAL_COMMUNITY): Payer: BLUE CROSS/BLUE SHIELD | Admitting: Certified Registered Nurse Anesthetist

## 2017-01-10 ENCOUNTER — Inpatient Hospital Stay (HOSPITAL_COMMUNITY)
Admission: RE | Admit: 2017-01-10 | Discharge: 2017-01-27 | DRG: 326 | Disposition: A | Discharge status: Home / Self Care | Payer: BLUE CROSS/BLUE SHIELD | Source: Ambulatory Visit | Attending: General Surgery | Admitting: General Surgery

## 2017-01-10 ENCOUNTER — Encounter (HOSPITAL_COMMUNITY)
Admission: RE | Disposition: A | Discharge status: Home / Self Care | Payer: Self-pay | Source: Ambulatory Visit | Attending: General Surgery

## 2017-01-10 DIAGNOSIS — R7303 Prediabetes: Secondary | ICD-10-CM | POA: Diagnosis present

## 2017-01-10 DIAGNOSIS — C163 Malignant neoplasm of pyloric antrum: Secondary | ICD-10-CM | POA: Diagnosis not present

## 2017-01-10 DIAGNOSIS — G8918 Other acute postprocedural pain: Secondary | ICD-10-CM | POA: Diagnosis not present

## 2017-01-10 DIAGNOSIS — R066 Hiccough: Secondary | ICD-10-CM | POA: Diagnosis not present

## 2017-01-10 DIAGNOSIS — Z681 Body mass index (BMI) 19 or less, adult: Secondary | ICD-10-CM | POA: Diagnosis not present

## 2017-01-10 DIAGNOSIS — K311 Adult hypertrophic pyloric stenosis: Secondary | ICD-10-CM | POA: Diagnosis not present

## 2017-01-10 DIAGNOSIS — K219 Gastro-esophageal reflux disease without esophagitis: Secondary | ICD-10-CM | POA: Diagnosis not present

## 2017-01-10 DIAGNOSIS — D630 Anemia in neoplastic disease: Secondary | ICD-10-CM | POA: Diagnosis not present

## 2017-01-10 DIAGNOSIS — Z8042 Family history of malignant neoplasm of prostate: Secondary | ICD-10-CM | POA: Diagnosis not present

## 2017-01-10 DIAGNOSIS — K8010 Calculus of gallbladder with chronic cholecystitis without obstruction: Secondary | ICD-10-CM | POA: Diagnosis not present

## 2017-01-10 DIAGNOSIS — Z4659 Encounter for fitting and adjustment of other gastrointestinal appliance and device: Secondary | ICD-10-CM

## 2017-01-10 DIAGNOSIS — C772 Secondary and unspecified malignant neoplasm of intra-abdominal lymph nodes: Secondary | ICD-10-CM | POA: Diagnosis present

## 2017-01-10 DIAGNOSIS — R9431 Abnormal electrocardiogram [ECG] [EKG]: Secondary | ICD-10-CM | POA: Diagnosis not present

## 2017-01-10 DIAGNOSIS — R778 Other specified abnormalities of plasma proteins: Secondary | ICD-10-CM

## 2017-01-10 DIAGNOSIS — I472 Ventricular tachycardia: Secondary | ICD-10-CM | POA: Diagnosis not present

## 2017-01-10 DIAGNOSIS — R7989 Other specified abnormal findings of blood chemistry: Secondary | ICD-10-CM

## 2017-01-10 DIAGNOSIS — E669 Obesity, unspecified: Secondary | ICD-10-CM | POA: Diagnosis not present

## 2017-01-10 DIAGNOSIS — Z23 Encounter for immunization: Secondary | ICD-10-CM

## 2017-01-10 DIAGNOSIS — Z789 Other specified health status: Secondary | ICD-10-CM

## 2017-01-10 DIAGNOSIS — D62 Acute posthemorrhagic anemia: Secondary | ICD-10-CM | POA: Diagnosis not present

## 2017-01-10 DIAGNOSIS — I4729 Other ventricular tachycardia: Secondary | ICD-10-CM

## 2017-01-10 DIAGNOSIS — E440 Moderate protein-calorie malnutrition: Secondary | ICD-10-CM | POA: Diagnosis not present

## 2017-01-10 DIAGNOSIS — C160 Malignant neoplasm of cardia: Secondary | ICD-10-CM | POA: Diagnosis present

## 2017-01-10 DIAGNOSIS — C7889 Secondary malignant neoplasm of other digestive organs: Secondary | ICD-10-CM | POA: Diagnosis not present

## 2017-01-10 DIAGNOSIS — E43 Unspecified severe protein-calorie malnutrition: Secondary | ICD-10-CM | POA: Insufficient documentation

## 2017-01-10 DIAGNOSIS — R748 Abnormal levels of other serum enzymes: Secondary | ICD-10-CM | POA: Diagnosis not present

## 2017-01-10 DIAGNOSIS — R112 Nausea with vomiting, unspecified: Secondary | ICD-10-CM

## 2017-01-10 HISTORY — PX: GASTRECTOMY: SHX58

## 2017-01-10 HISTORY — PX: CHOLECYSTECTOMY: SHX55

## 2017-01-10 LAB — MRSA PCR SCREENING: MRSA by PCR: NEGATIVE

## 2017-01-10 LAB — ABO/RH
ABO/RH(D): A POS
Weak D: POSITIVE

## 2017-01-10 SURGERY — GASTRECTOMY, TOTAL
Anesthesia: General | Site: Abdomen

## 2017-01-10 MED ORDER — NALBUPHINE HCL 10 MG/ML IJ SOLN
5.0000 mg | INTRAMUSCULAR | Status: DC | PRN
  Filled 2017-01-10: qty 0.5

## 2017-01-10 MED ORDER — DIPHENHYDRAMINE HCL 25 MG PO CAPS
25.0000 mg | ORAL_CAPSULE | ORAL | Status: DC | PRN
  Filled 2017-01-10: qty 1

## 2017-01-10 MED ORDER — HYDRALAZINE HCL 20 MG/ML IJ SOLN: 10.0000 mg | INTRAMUSCULAR | Status: DC | PRN

## 2017-01-10 MED ORDER — MIDAZOLAM HCL 2 MG/2ML IJ SOLN
INTRAMUSCULAR | Status: AC
  Filled 2017-01-10: qty 2

## 2017-01-10 MED ORDER — LACTATED RINGERS IV SOLN
INTRAVENOUS | Status: DC | PRN
  Administered 2017-01-10 (×2): via INTRAVENOUS

## 2017-01-10 MED ORDER — ONDANSETRON 4 MG PO TBDP
4.0000 mg | ORAL_TABLET | Freq: Four times a day (QID) | ORAL | Status: DC | PRN
  Filled 2017-01-10: qty 1

## 2017-01-10 MED ORDER — DIPHENHYDRAMINE HCL 50 MG/ML IJ SOLN
12.5000 mg | INTRAMUSCULAR | Status: DC | PRN
  Filled 2017-01-10: qty 0.25

## 2017-01-10 MED ORDER — FENTANYL CITRATE (PF) 100 MCG/2ML IJ SOLN: 25.0000 ug | INTRAMUSCULAR | Status: DC | PRN

## 2017-01-10 MED ORDER — KETOROLAC TROMETHAMINE 30 MG/ML IJ SOLN
30.0000 mg | Freq: Four times a day (QID) | INTRAMUSCULAR | Status: AC | PRN
  Filled 2017-01-10: qty 1

## 2017-01-10 MED ORDER — KCL IN DEXTROSE-NACL 20-5-0.45 MEQ/L-%-% IV SOLN
INTRAVENOUS | Status: DC
  Administered 2017-01-10 – 2017-01-20 (×15): via INTRAVENOUS
  Filled 2017-01-10 (×25): qty 1000

## 2017-01-10 MED ORDER — ROCURONIUM BROMIDE 100 MG/10ML IV SOLN
INTRAVENOUS | Status: DC | PRN
  Administered 2017-01-10: 10 mg via INTRAVENOUS
  Administered 2017-01-10: 50 mg via INTRAVENOUS
  Administered 2017-01-10: 20 mg via INTRAVENOUS

## 2017-01-10 MED ORDER — NALOXONE HCL 2 MG/2ML IJ SOSY
1.0000 ug/kg/h | PREFILLED_SYRINGE | INTRAVENOUS | Status: DC | PRN
  Filled 2017-01-10: qty 2

## 2017-01-10 MED ORDER — OXYCODONE HCL 5 MG PO TABS: 5.0000 mg | ORAL_TABLET | Freq: Once | ORAL | Status: DC | PRN

## 2017-01-10 MED ORDER — CHLORHEXIDINE GLUCONATE 0.12 % MT SOLN
15.0000 mL | Freq: Two times a day (BID) | OROMUCOSAL | Status: DC
  Administered 2017-01-10 – 2017-01-23 (×26): 15 mL via OROMUCOSAL
  Filled 2017-01-10 (×27): qty 15

## 2017-01-10 MED ORDER — FENTANYL CITRATE (PF) 100 MCG/2ML IJ SOLN
INTRAMUSCULAR | Status: DC | PRN
  Administered 2017-01-10: 100 ug via INTRAVENOUS
  Administered 2017-01-10 (×2): 50 ug via INTRAVENOUS

## 2017-01-10 MED ORDER — PANTOPRAZOLE SODIUM 40 MG IV SOLR
40.0000 mg | Freq: Every day | INTRAVENOUS | Status: DC
  Administered 2017-01-10 – 2017-01-22 (×13): 40 mg via INTRAVENOUS
  Filled 2017-01-10 (×13): qty 40

## 2017-01-10 MED ORDER — CHLORHEXIDINE GLUCONATE CLOTH 2 % EX PADS: 6.0000 | MEDICATED_PAD | Freq: Once | CUTANEOUS | Status: DC

## 2017-01-10 MED ORDER — HYDROMORPHONE 1 MG/ML IV SOLN
INTRAVENOUS | Status: DC
  Administered 2017-01-10: 11:00:00 via INTRAVENOUS
  Administered 2017-01-10: 3 mg via INTRAVENOUS
  Administered 2017-01-10: 5.7 mg via INTRAVENOUS
  Administered 2017-01-11: 1.8 mg via INTRAVENOUS
  Administered 2017-01-11: 2.1 mg via INTRAVENOUS
  Administered 2017-01-11 (×3): 0.9 mg via INTRAVENOUS
  Administered 2017-01-11: 2.1 mg via INTRAVENOUS
  Administered 2017-01-12 (×2): 0.6 mg via INTRAVENOUS
  Administered 2017-01-12: 0.3 mg via INTRAVENOUS
  Administered 2017-01-12: 0 mg via INTRAVENOUS
  Administered 2017-01-12: 1.2 mg via INTRAVENOUS
  Administered 2017-01-13: 0.3 mg via INTRAVENOUS
  Administered 2017-01-13 (×2): 0 mg via INTRAVENOUS
  Administered 2017-01-13: 0.3 mg via INTRAVENOUS
  Administered 2017-01-13 (×2): 0.6 mg via INTRAVENOUS
  Administered 2017-01-14: 0.9 mg via INTRAVENOUS
  Administered 2017-01-14: 0 mg via INTRAVENOUS
  Administered 2017-01-14 (×2): 0.6 mg via INTRAVENOUS
  Administered 2017-01-14: 0 mg via INTRAVENOUS
  Administered 2017-01-14: 0.6 mg via INTRAVENOUS
  Administered 2017-01-15 (×2): 0.3 mg via INTRAVENOUS
  Administered 2017-01-15: 0.6 mg via INTRAVENOUS
  Administered 2017-01-15 – 2017-01-16 (×2): 0 mg via INTRAVENOUS
  Administered 2017-01-16: 0.9 mg via INTRAVENOUS
  Administered 2017-01-16 – 2017-01-17 (×5): 0.3 mg via INTRAVENOUS
  Administered 2017-01-17: 0.6 mg via INTRAVENOUS
  Administered 2017-01-17: 0 mg via INTRAVENOUS
  Administered 2017-01-18: 0.3 mg via INTRAVENOUS
  Administered 2017-01-18: 0 mg via INTRAVENOUS
  Administered 2017-01-18 – 2017-01-19 (×3): 0.3 mg via INTRAVENOUS
  Administered 2017-01-19: 0 mg via INTRAVENOUS
  Administered 2017-01-19: 0.3 mg via INTRAVENOUS
  Filled 2017-01-10: qty 25

## 2017-01-10 MED ORDER — SUGAMMADEX SODIUM 200 MG/2ML IV SOLN
INTRAVENOUS | Status: DC | PRN
  Administered 2017-01-10: 200 mg via INTRAVENOUS

## 2017-01-10 MED ORDER — ROPIVACAINE HCL 2 MG/ML IJ SOLN
10.0000 mL/h | INTRAMUSCULAR | Status: AC
  Administered 2017-01-10 – 2017-01-12 (×3): 10 mL/h via EPIDURAL
  Filled 2017-01-10 (×4): qty 200

## 2017-01-10 MED ORDER — LIDOCAINE HCL (CARDIAC) 20 MG/ML IV SOLN
INTRAVENOUS | Status: DC | PRN
  Administered 2017-01-10: 60 mg via INTRAVENOUS

## 2017-01-10 MED ORDER — SCOPOLAMINE 1 MG/3DAYS TD PT72
1.0000 | MEDICATED_PATCH | Freq: Once | TRANSDERMAL | Status: AC
  Administered 2017-01-18: 1.5 mg via TRANSDERMAL
  Filled 2017-01-10 (×2): qty 1

## 2017-01-10 MED ORDER — SODIUM CHLORIDE 0.9% FLUSH: 9.0000 mL | INTRAVENOUS | Status: DC | PRN

## 2017-01-10 MED ORDER — HYDROMORPHONE HCL 1 MG/ML IJ SOLN: 0.5000 mg | INTRAMUSCULAR | Status: DC | PRN

## 2017-01-10 MED ORDER — ONDANSETRON HCL 4 MG/2ML IJ SOLN: 4.0000 mg | Freq: Once | INTRAMUSCULAR | Status: DC | PRN

## 2017-01-10 MED ORDER — ROCURONIUM BROMIDE 50 MG/5ML IV SOSY
PREFILLED_SYRINGE | INTRAVENOUS | Status: AC
  Filled 2017-01-10: qty 10

## 2017-01-10 MED ORDER — 0.9 % SODIUM CHLORIDE (POUR BTL) OPTIME
TOPICAL | Status: DC | PRN
  Administered 2017-01-10: 2000 mL

## 2017-01-10 MED ORDER — FENTANYL CITRATE (PF) 100 MCG/2ML IJ SOLN
25.0000 ug | INTRAMUSCULAR | Status: DC | PRN
  Administered 2017-01-10 (×2): 50 ug via INTRAVENOUS

## 2017-01-10 MED ORDER — STERILE WATER FOR IRRIGATION IR SOLN
Status: DC | PRN
  Administered 2017-01-10: 1000 mL

## 2017-01-10 MED ORDER — SUCCINYLCHOLINE CHLORIDE 200 MG/10ML IV SOSY
PREFILLED_SYRINGE | INTRAVENOUS | Status: AC
  Filled 2017-01-10: qty 10

## 2017-01-10 MED ORDER — OXYCODONE HCL 5 MG/5ML PO SOLN: 5.0000 mg | Freq: Once | ORAL | Status: DC | PRN

## 2017-01-10 MED ORDER — CEFAZOLIN SODIUM-DEXTROSE 2-4 GM/100ML-% IV SOLN
2.0000 g | Freq: Three times a day (TID) | INTRAVENOUS | Status: AC
  Administered 2017-01-10: 2 g via INTRAVENOUS
  Filled 2017-01-10: qty 100

## 2017-01-10 MED ORDER — PHENYLEPHRINE 40 MCG/ML (10ML) SYRINGE FOR IV PUSH (FOR BLOOD PRESSURE SUPPORT)
PREFILLED_SYRINGE | INTRAVENOUS | Status: AC
  Filled 2017-01-10: qty 10

## 2017-01-10 MED ORDER — ONDANSETRON HCL 4 MG/2ML IJ SOLN
4.0000 mg | Freq: Four times a day (QID) | INTRAMUSCULAR | Status: DC | PRN
  Filled 2017-01-10 (×2): qty 2

## 2017-01-10 MED ORDER — DIPHENHYDRAMINE HCL 50 MG/ML IJ SOLN: 12.5000 mg | Freq: Four times a day (QID) | INTRAMUSCULAR | Status: DC | PRN

## 2017-01-10 MED ORDER — SODIUM CHLORIDE 0.9% FLUSH
3.0000 mL | INTRAVENOUS | Status: DC | PRN
Start: 2017-01-10 — End: 2017-01-27

## 2017-01-10 MED ORDER — ONDANSETRON HCL 4 MG/2ML IJ SOLN
INTRAMUSCULAR | Status: AC
  Filled 2017-01-10: qty 2

## 2017-01-10 MED ORDER — NALBUPHINE HCL 10 MG/ML IJ SOLN: 5.0000 mg | Freq: Once | INTRAMUSCULAR | Status: DC | PRN

## 2017-01-10 MED ORDER — SUCCINYLCHOLINE CHLORIDE 20 MG/ML IJ SOLN
INTRAMUSCULAR | Status: DC | PRN
  Administered 2017-01-10: 160 mg via INTRAVENOUS

## 2017-01-10 MED ORDER — FENTANYL CITRATE (PF) 100 MCG/2ML IJ SOLN
INTRAMUSCULAR | Status: AC
  Filled 2017-01-10: qty 2

## 2017-01-10 MED ORDER — ROCURONIUM BROMIDE 50 MG/5ML IV SOSY
PREFILLED_SYRINGE | INTRAVENOUS | Status: AC
  Filled 2017-01-10: qty 5

## 2017-01-10 MED ORDER — DIPHENHYDRAMINE HCL 12.5 MG/5ML PO ELIX: 12.5000 mg | ORAL_SOLUTION | Freq: Four times a day (QID) | ORAL | Status: DC | PRN

## 2017-01-10 MED ORDER — LIDOCAINE 2% (20 MG/ML) 5 ML SYRINGE
INTRAMUSCULAR | Status: AC
  Filled 2017-01-10: qty 5

## 2017-01-10 MED ORDER — HYDROMORPHONE 1 MG/ML IV SOLN
INTRAVENOUS | Status: AC
  Filled 2017-01-10: qty 25

## 2017-01-10 MED ORDER — EVICEL 2 ML EX KIT
PACK | CUTANEOUS | Status: DC | PRN
  Administered 2017-01-10: 2 mL

## 2017-01-10 MED ORDER — NALOXONE HCL 0.4 MG/ML IJ SOLN
0.4000 mg | INTRAMUSCULAR | Status: DC | PRN
  Filled 2017-01-10: qty 1

## 2017-01-10 MED ORDER — PHENYLEPHRINE HCL 10 MG/ML IJ SOLN
INTRAMUSCULAR | Status: DC | PRN
  Administered 2017-01-10 (×2): 80 ug via INTRAVENOUS

## 2017-01-10 MED ORDER — CEFAZOLIN SODIUM-DEXTROSE 2-4 GM/100ML-% IV SOLN
INTRAVENOUS | Status: AC
  Filled 2017-01-10: qty 100

## 2017-01-10 MED ORDER — ORAL CARE MOUTH RINSE
15.0000 mL | Freq: Two times a day (BID) | OROMUCOSAL | Status: DC
  Administered 2017-01-11 – 2017-01-22 (×12): 15 mL via OROMUCOSAL

## 2017-01-10 MED ORDER — BUPIVACAINE HCL (PF) 0.25 % IJ SOLN
INTRAMUSCULAR | Status: DC | PRN
  Administered 2017-01-10: 30 mL

## 2017-01-10 MED ORDER — KCL IN DEXTROSE-NACL 20-5-0.45 MEQ/L-%-% IV SOLN
INTRAVENOUS | Status: AC
  Filled 2017-01-10: qty 1000

## 2017-01-10 MED ORDER — FENTANYL CITRATE (PF) 100 MCG/2ML IJ SOLN
INTRAMUSCULAR | Status: AC
  Filled 2017-01-10: qty 4

## 2017-01-10 MED ORDER — PROPOFOL 10 MG/ML IV BOLUS
INTRAVENOUS | Status: DC | PRN
  Administered 2017-01-10: 180 mg via INTRAVENOUS

## 2017-01-10 MED ORDER — ONDANSETRON HCL 4 MG/2ML IJ SOLN
4.0000 mg | Freq: Four times a day (QID) | INTRAMUSCULAR | Status: DC | PRN
  Administered 2017-01-12 – 2017-01-26 (×4): 4 mg via INTRAVENOUS
  Filled 2017-01-10 (×3): qty 2

## 2017-01-10 MED ORDER — EVICEL 2 ML EX KIT
PACK | CUTANEOUS | Status: AC
  Filled 2017-01-10: qty 1

## 2017-01-10 MED ORDER — ONDANSETRON HCL 4 MG/2ML IJ SOLN
4.0000 mg | Freq: Three times a day (TID) | INTRAMUSCULAR | Status: DC | PRN
  Filled 2017-01-10: qty 2

## 2017-01-10 MED ORDER — BUPIVACAINE HCL (PF) 0.25 % IJ SOLN
INTRAMUSCULAR | Status: AC
  Filled 2017-01-10: qty 30

## 2017-01-10 MED ORDER — EVICEL 5 ML EX KIT
PACK | CUTANEOUS | Status: AC
  Filled 2017-01-10: qty 1

## 2017-01-10 MED ORDER — ONDANSETRON HCL 4 MG/2ML IJ SOLN
INTRAMUSCULAR | Status: DC | PRN
  Administered 2017-01-10: 4 mg via INTRAVENOUS

## 2017-01-10 MED ORDER — SUGAMMADEX SODIUM 200 MG/2ML IV SOLN
INTRAVENOUS | Status: AC
  Filled 2017-01-10: qty 2

## 2017-01-10 MED ORDER — PROPOFOL 10 MG/ML IV BOLUS
INTRAVENOUS | Status: AC
  Filled 2017-01-10: qty 40

## 2017-01-10 MED ORDER — NALOXONE HCL 0.4 MG/ML IJ SOLN: 0.4000 mg | INTRAMUSCULAR | Status: DC | PRN

## 2017-01-10 MED ORDER — MEPERIDINE HCL 25 MG/ML IJ SOLN: 6.2500 mg | INTRAMUSCULAR | Status: DC | PRN

## 2017-01-10 SURGICAL SUPPLY — 68 items
BIOPATCH RED 1 DISK 7.0 (GAUZE/BANDAGES/DRESSINGS) ×2 IMPLANT
BLADE CLIPPER SURG (BLADE) IMPLANT
CANISTER SUCT 3000ML PPV (MISCELLANEOUS) ×4 IMPLANT
CATH ROBINSON RED A/P 20FR (CATHETERS) ×2 IMPLANT
CHLORAPREP W/TINT 26ML (MISCELLANEOUS) ×2 IMPLANT
CLIP LIGATING HEM O LOK PURPLE (MISCELLANEOUS) ×2 IMPLANT
CLIP LIGATING HEMO O LOK GREEN (MISCELLANEOUS) ×2 IMPLANT
CLIP LIGATING HEMOLOK MED (MISCELLANEOUS) ×2 IMPLANT
CLIP TI LARGE 6 (CLIP) ×2 IMPLANT
CLIP TI MEDIUM 24 (CLIP) ×2 IMPLANT
COVER SURGICAL LIGHT HANDLE (MISCELLANEOUS) ×2 IMPLANT
DRAIN CHANNEL 19F RND (DRAIN) ×2 IMPLANT
DRAIN PENROSE 1/2X36 STERILE (WOUND CARE) IMPLANT
DRAPE LAPAROSCOPIC ABDOMINAL (DRAPES) ×2 IMPLANT
DRAPE UTILITY XL STRL (DRAPES) ×4 IMPLANT
DRAPE WARM FLUID 44X44 (DRAPE) ×2 IMPLANT
DRSG COVADERM 4X10 (GAUZE/BANDAGES/DRESSINGS) ×2 IMPLANT
DRSG COVADERM 4X8 (GAUZE/BANDAGES/DRESSINGS) IMPLANT
DRSG TEGADERM 2-3/8X2-3/4 SM (GAUZE/BANDAGES/DRESSINGS) ×2 IMPLANT
ELECT BLADE 4.0 EZ CLEAN MEGAD (MISCELLANEOUS) ×2
ELECT BLADE 6.5 EXT (BLADE) ×2 IMPLANT
ELECT CAUTERY BLADE 6.4 (BLADE) ×2 IMPLANT
ELECT REM PT RETURN 9FT ADLT (ELECTROSURGICAL) ×2
ELECTRODE BLDE 4.0 EZ CLN MEGD (MISCELLANEOUS) ×1 IMPLANT
ELECTRODE REM PT RTRN 9FT ADLT (ELECTROSURGICAL) ×1 IMPLANT
GAUZE SPONGE 4X4 12PLY STRL (GAUZE/BANDAGES/DRESSINGS) ×2 IMPLANT
GLOVE BIO SURGEON STRL SZ 6 (GLOVE) ×2 IMPLANT
GLOVE BIOGEL PI IND STRL 6.5 (GLOVE) ×1 IMPLANT
GLOVE BIOGEL PI IND STRL 8.5 (GLOVE) ×1 IMPLANT
GLOVE BIOGEL PI INDICATOR 6.5 (GLOVE) ×1
GLOVE BIOGEL PI INDICATOR 8.5 (GLOVE) ×1
GLOVE INDICATOR 6.5 STRL GRN (GLOVE) ×2 IMPLANT
GLOVE INDICATOR 7.0 STRL GRN (GLOVE) ×2 IMPLANT
GLOVE SURG SS PI 6.5 STRL IVOR (GLOVE) ×2 IMPLANT
GLOVE SURG SS PI 7.0 STRL IVOR (GLOVE) ×2 IMPLANT
GOWN STRL REUS W/ TWL LRG LVL3 (GOWN DISPOSABLE) ×2 IMPLANT
GOWN STRL REUS W/TWL 2XL LVL3 (GOWN DISPOSABLE) ×4 IMPLANT
GOWN STRL REUS W/TWL LRG LVL3 (GOWN DISPOSABLE) ×2
KIT BASIN OR (CUSTOM PROCEDURE TRAY) ×2 IMPLANT
KIT ROOM TURNOVER OR (KITS) ×2 IMPLANT
NEEDLE 25GX 5/8IN NON SAFETY (NEEDLE) ×2 IMPLANT
NS IRRIG 1000ML POUR BTL (IV SOLUTION) ×4 IMPLANT
PACK GENERAL/GYN (CUSTOM PROCEDURE TRAY) ×2 IMPLANT
PAD ARMBOARD 7.5X6 YLW CONV (MISCELLANEOUS) ×4 IMPLANT
RELOAD PROXIMATE 75MM GREEN (ENDOMECHANICALS) ×6 IMPLANT
SHEARS FOC LG CVD HARMONIC 17C (MISCELLANEOUS) IMPLANT
SPECIMEN JAR X LARGE (MISCELLANEOUS) ×2 IMPLANT
SPONGE GAUZE 4X4 12PLY STER LF (GAUZE/BANDAGES/DRESSINGS) ×2 IMPLANT
SPONGE LAP 18X18 X RAY DECT (DISPOSABLE) IMPLANT
STAPLER ECHELON FLEX (STAPLE) IMPLANT
STAPLER GUN LINEAR PROX 60 (STAPLE) ×2 IMPLANT
STAPLER PROXIMATE 75MM BLUE (STAPLE) ×2 IMPLANT
STAPLER VISISTAT 35W (STAPLE) ×4 IMPLANT
SUT ETHILON 2 0 FS 18 (SUTURE) ×6 IMPLANT
SUT PDS AB 1 TP1 96 (SUTURE) ×4 IMPLANT
SUT PDS AB 3-0 SH 27 (SUTURE) ×6 IMPLANT
SUT PDS II 0 TP-1 LOOPED 60 (SUTURE) IMPLANT
SUT SILK 2 0 SH CR/8 (SUTURE) ×4 IMPLANT
SUT SILK 2 0 TIES 10X30 (SUTURE) ×2 IMPLANT
SUT SILK 3 0 SH CR/8 (SUTURE) ×2 IMPLANT
SUT SILK 3 0 TIES 10X30 (SUTURE) ×2 IMPLANT
SYRINGE 10CC LL (SYRINGE) ×4 IMPLANT
TOWEL OR 17X24 6PK STRL BLUE (TOWEL DISPOSABLE) ×2 IMPLANT
TOWEL OR 17X26 10 PK STRL BLUE (TOWEL DISPOSABLE) ×2 IMPLANT
TRAY FOLEY CATH 14FRSI W/METER (CATHETERS) ×2 IMPLANT
TUNNELER SHEATH ON-Q 16GX12 DP (PAIN MANAGEMENT) ×2 IMPLANT
WATER STERILE IRR 1000ML POUR (IV SOLUTION) IMPLANT
YANKAUER SUCT BULB TIP NO VENT (SUCTIONS) ×2 IMPLANT

## 2017-01-10 NOTE — Anesthesia Postprocedure Evaluation (Signed)
Anesthesia Post Note  Patient: [name]  Procedure(s) Performed: Procedure(s) (LRB): DISTAL GASTRECTOMY, JEJUNUM  FEEDING TUBE PLACEMENT (N/A) CHOLECYSTECTOMY (N/A)  Patient location during evaluation: PACU Anesthesia Type: General Level of consciousness: awake, awake and alert and oriented Pain management: pain level controlled Vital Signs Assessment: post-procedure vital signs reviewed and stable Respiratory status: spontaneous breathing, nonlabored ventilation, respiratory function stable and patient connected to nasal cannula oxygen Cardiovascular status: blood pressure returned to baseline Anesthetic complications: no       Last Vitals:  Vitals:   01/10/17 1307 01/10/17 1337  BP: (!) 143/70 (!) 141/70  Pulse: 83 82  Resp: 13 12  Temp:      Last Pain:  Vitals:   01/10/17 1307  TempSrc:   PainSc: Asleep                 Stephenia Vogan COKER

## 2017-01-10 NOTE — Anesthesia Procedure Notes (Signed)
Epidural Patient location during procedure: OR Start time: 01/10/2017 10:20 AM End time: 01/10/2017 10:25 AM  Staffing Anesthesiologist: Linna Caprice, Sua Spadafora Performed: anesthesiologist   Preanesthetic Checklist Completed: patient identified, site marked, surgical consent, pre-op evaluation, timeout performed, IV checked, risks and benefits discussed and monitors and equipment checked  Epidural Patient position: left lateral decubitus Prep: ChloraPrep Patient monitoring: heart rate, cardiac monitor, continuous pulse ox and blood pressure Approach: midline Location: L2-L3 Injection technique: LOR air  Needle:  Needle type: Tuohy  Needle gauge: 18 G Needle length: 9 cm Needle insertion depth: 7 cm Catheter at skin depth: 13 cm  Additional Notes Loss of resistance with air. Catheter threaded 5 cm into epidural space.Reason for block:at surgeon's request and post-op pain management

## 2017-01-10 NOTE — Transfer of Care (Cosign Needed)
Immediate Anesthesia Transfer of Care Note  Patient: [name]  Procedure(s) Performed: Procedure(s): DISTAL GASTRECTOMY, JEJUNUM  FEEDING TUBE PLACEMENT (N/A) CHOLECYSTECTOMY (N/A)  Patient Location: PACU  Anesthesia Type:GA combined with regional for post-op pain  Level of Consciousness: awake and alert   Airway & Oxygen Therapy: Patient Spontanous Breathing and Patient connected to face mask oxygen  Post-op Assessment: Report given to RN and Post -op Vital signs reviewed and stable  Post vital signs: Reviewed and stable  Last Vitals:  Vitals:   01/10/17 0631 01/10/17 1038  BP: 137/81 (!) 141/66  Pulse: 77 71  Resp: 18 14  Temp: 36.9 C     Last Pain:  Vitals:   01/10/17 0631  TempSrc: Oral  PainSc:       Patients Stated Pain Goal: 1 ([id number])  Complications: No apparent anesthesia complications

## 2017-01-10 NOTE — Anesthesia Procedure Notes (Signed)
Procedure Name: Intubation Date/Time: 01/10/2017 7:45 AM Performed by: Linna Caprice, DAVID Pre-anesthesia Checklist: Patient identified, Emergency Drugs available, Suction available, Patient being monitored and Timeout performed Patient Re-evaluated:Patient Re-evaluated prior to inductionOxygen Delivery Method: Circle system utilized Preoxygenation: Pre-oxygenation with 100% oxygen Intubation Type: IV induction and Cricoid Pressure applied Laryngoscope Size: Mac and 4 Grade View: Grade I Tube type: Oral Number of attempts: 1 Airway Equipment and Method: Stylet Placement Confirmation: ETT inserted through vocal cords under direct vision,  positive ETCO2 and breath sounds checked- equal and bilateral Secured at: 24 cm Tube secured with: Tape Dental Injury: Teeth and Oropharynx as per pre-operative assessment  Difficulty Due To: Difficulty was unanticipated Comments: Intubation performed by Carver Fila, SRNA

## 2017-01-10 NOTE — H&P (Signed)
[name] is an 66 y.o. male.   Chief Complaint: gastric cancer HPI: Pt is a 66 yo M who presented to the hospital with gastric outlet obstruction and anemia.  EGD was performed and he has a near obstructing antral cancer.  He presents for surgery.    Past Medical History:  Diagnosis Date  . Anemia   . GERD (gastroesophageal reflux disease)   . Pre-diabetes     Past Surgical History:  Procedure Laterality Date  . COLONOSCOPY    . COLONOSCOPY  10/11/2011   Procedure: COLONOSCOPY;  Surgeon: Daneil Dolin, MD;  [facility];  Service: Endoscopy;  Laterality: N/A;  10:30 AM  . ESOPHAGOGASTRODUODENOSCOPY N/A 12/30/2016   Procedure: ESOPHAGOGASTRODUODENOSCOPY (EGD);  Surgeon: Steven Ada, MD;  Location: [facility];  Service: Endoscopy;  Laterality: N/A;  . testicle removed  1982    Family History  Problem Relation Age of Onset  . Colon cancer Neg Hx    Social History:  reports that he has never smoked. He has never used smokeless tobacco. He reports that he drinks about 2.4 oz of alcohol per week . He reports that he does not use drugs.  Allergies:  Allergies  Allergen Reactions  . No Known Allergies     Medications Prior to Admission  Medication Sig Dispense Refill  . ferrous sulfate 325 (65 FE) MG tablet Take 325 mg by mouth daily with breakfast.    . Multiple Vitamins-Minerals (MULTIVITAMINS THER. W/MINERALS) TABS Take 1 tablet by mouth daily.      . ranitidine (ZANTAC) 150 MG tablet Take 150 mg by mouth daily as needed for heartburn.      Results for orders placed or performed during the hospital encounter of 01/09/17 (from the past 48 hour(s))  ABO/Rh     Status: None   Collection Time: 01/09/17  1:05 PM  Result Value Ref Range   ABO/RH(D) A POS   Glucose, capillary     Status: Abnormal   Collection Time: 01/09/17  1:08 PM  Result Value Ref Range   Glucose-Capillary 106 (H) 65 - 99 mg/dL   Comment 1 Notify RN    Comment 2 Document in Chart   CBC  WITH DIFFERENTIAL     Status: Abnormal   Collection Time: 01/09/17  1:56 PM  Result Value Ref Range   WBC 6.1 4.0 - 10.5 K/uL   RBC 4.67 4.22 - 5.81 MIL/uL   Hemoglobin 11.9 (L) 13.0 - 17.0 g/dL   HCT 37.5 (L) 39.0 - 52.0 %   MCV 80.3 78.0 - 100.0 fL   MCH 25.5 (L) 26.0 - 34.0 pg   MCHC 31.7 30.0 - 36.0 g/dL   RDW 15.4 11.5 - 15.5 %   Platelets 221 150 - 400 K/uL   Neutrophils Relative % 70 %   Neutro Abs 4.3 1.7 - 7.7 K/uL   Lymphocytes Relative 21 %   Lymphs Abs 1.3 0.7 - 4.0 K/uL   Monocytes Relative 8 %   Monocytes Absolute 0.5 0.1 - 1.0 K/uL   Eosinophils Relative 1 %   Eosinophils Absolute 0.1 0.0 - 0.7 K/uL   Basophils Relative 0 %   Basophils Absolute 0.0 0.0 - 0.1 K/uL  Comprehensive metabolic panel     Status: Abnormal   Collection Time: 01/09/17  1:56 PM  Result Value Ref Range   Sodium 137 135 - 145 mmol/L   Potassium 3.8 3.5 - 5.1 mmol/L   Chloride 102 101 - 111 mmol/L  CO2 24 22 - 32 mmol/L   Glucose, Bld 111 (H) 65 - 99 mg/dL   BUN 12 6 - 20 mg/dL   Creatinine, Ser 1.11 0.61 - 1.24 mg/dL   Calcium 10.0 8.9 - 10.3 mg/dL   Total Protein 7.4 6.5 - 8.1 g/dL   Albumin 3.8 3.5 - 5.0 g/dL   AST 23 15 - 41 U/L   ALT 34 17 - 63 U/L   Alkaline Phosphatase 52 38 - 126 U/L   Total Bilirubin 1.0 0.3 - 1.2 mg/dL   GFR calc non Af Amer >60 >60 mL/min   GFR calc Af Amer >60 >60 mL/min    Comment: (NOTE) The eGFR has been calculated using the CKD EPI equation. This calculation has not been validated in all clinical situations. eGFR's persistently <60 mL/min signify possible Chronic Kidney Disease.    Anion gap 11 5 - 15  Protime-INR     Status: None   Collection Time: 01/09/17  1:56 PM  Result Value Ref Range   Prothrombin Time 14.9 11.4 - 15.2 seconds   INR 1.16   Type and screen     Status: None   Collection Time: 01/09/17  1:56 PM  Result Value Ref Range   ABO/RH(D) A POS    Antibody Screen NEG    Sample Expiration 01/23/2017    Extend sample reason NO  TRANSFUSIONS OR PREGNANCY IN THE PAST 3 MONTHS   Urinalysis, Routine w reflex microscopic     Status: Abnormal   Collection Time: 01/09/17  1:57 PM  Result Value Ref Range   Color, Urine AMBER (A) YELLOW    Comment: BIOCHEMICALS MAY BE AFFECTED BY COLOR   APPearance HAZY (A) CLEAR   Specific Gravity, Urine 1.035 (H) 1.005 - 1.030   pH 5.0 5.0 - 8.0   Glucose, UA NEGATIVE NEGATIVE mg/dL   Hgb urine dipstick NEGATIVE NEGATIVE   Bilirubin Urine SMALL (A) NEGATIVE   Ketones, ur 80 (A) NEGATIVE mg/dL   Protein, ur 30 (A) NEGATIVE mg/dL   Nitrite NEGATIVE NEGATIVE   Leukocytes, UA NEGATIVE NEGATIVE   RBC / HPF 0-5 0 - 5 RBC/hpf   WBC, UA 0-5 0 - 5 WBC/hpf   Bacteria, UA NONE SEEN NONE SEEN   Squamous Epithelial / LPF 0-5 (A) NONE SEEN   Mucous PRESENT    Hyaline Casts, UA PRESENT    No results found.  Review of Systems  All other systems reviewed and are negative.   Blood pressure 137/81, pulse 77, temperature 98.5 F (36.9 C), temperature source Oral, resp. rate 18, weight 121.6 kg (268 lb), SpO2 99 %. Physical Exam  Constitutional: He is oriented to person, place, and time. He appears well-developed and well-nourished. No distress.  HENT:  Head: Normocephalic and atraumatic.  Eyes: Conjunctivae are normal. Pupils are equal, round, and reactive to light. No scleral icterus.  Cardiovascular: Normal rate and regular rhythm.   Respiratory: Effort normal. No respiratory distress.  GI: Soft. He exhibits no distension. There is no tenderness.  Musculoskeletal: Normal range of motion.  Neurological: He is alert and oriented to person, place, and time.  Skin: Skin is warm and dry. No rash noted. He is not diaphoretic. No erythema. No pallor.  Psychiatric: He has a normal mood and affect. His behavior is normal. Judgment and thought content normal.     Assessment/Plan Distal gastric cancer.   Plan open distal gastrectomy vs bypass with feeding tube.  He has only been tolerating clear  liquids, so will need bypass even if metastatic disease is found.    Discussed risks/benefits extensively in clinic last week. Pt and family do not have additional questions.    Stark Klein, MD 01/10/2017, 7:35 AM

## 2017-01-10 NOTE — Anesthesia Preprocedure Evaluation (Signed)
Anesthesia Evaluation  Patient identified by MRN, date of birth, ID band Patient awake    Reviewed: Allergy & Precautions, NPO status , Patient's Chart, lab work & pertinent test results  Airway Mallampati: II  TM Distance: >3 FB Neck ROM: Full    Dental  (+) Teeth Intact, Dental Advisory Given   Pulmonary    breath sounds clear to auscultation       Cardiovascular  Rhythm:Regular Rate:Normal     Neuro/Psych    GI/Hepatic   Endo/Other    Renal/GU      Musculoskeletal   Abdominal (+) + obese,   Peds  Hematology   Anesthesia Other Findings   Reproductive/Obstetrics                             Anesthesia Physical Anesthesia Plan  ASA: III  Anesthesia Plan: General   Post-op Pain Management:    Induction: Intravenous  Airway Management Planned: Oral ETT  Additional Equipment:   Intra-op Plan:   Post-operative Plan: Extubation in OR  Informed Consent: I have reviewed the patients History and Physical, chart, labs and discussed the procedure including the risks, benefits and alternatives for the proposed anesthesia with the patient or authorized representative who has indicated his/her understanding and acceptance.   Dental advisory given  Plan Discussed with: CRNA and Anesthesiologist  Anesthesia Plan Comments:         Anesthesia Quick Evaluation  

## 2017-01-10 NOTE — Progress Notes (Signed)
NG tube placement verified with Dr. Barry Dienes and no x-ray is needed.

## 2017-01-10 NOTE — Op Note (Signed)
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    LOCAL MEDICATIONS USED:  BUPIVICAINE  and LIDOCAINE   SPECIMEN:  Source of Specimen:  Distal stomach, lesser sac nodule, gallbladder  DISPOSITION OF SPECIMEN:  PATHOLOGY  COUNTS:  YES  DICTATION: .Dragon Dictation  PLAN OF CARE: Admit to inpatient   PATIENT DISPOSITION:  PACU - hemodynamically stable.  FINDINGS:  No evidence of carcinomatosis, mass in distal stomach, lesser curve, several enlarged LN.    EBL: 50 mL  PROCEDURE:   The patient was identified in the holding area and was taken to the OR where he was placed supine on the operating room table.  General anesthesia was induced.  Foley catheter was placed.  Abdomen was prepped and draped in sterile fashion.  Timeout was performed according to the surgical safety checklist.  When all was correct, we continued.    A midline incision was made from the xiphoid to just below the umbilicus with a [id number] blade.  The bovie was used to divide the subcutaneous tissues.  The fascia was entered sharply and opened up the length of the incision.  There was no overt evidence of carcinomatosis.  The tumor in the antrum was clearly invading through the wall of the stomach.  The colon was taken down from the stomach.  The distal stomach was slightly fixed posteriorly.  The gallbladder was full of stones.       The Harmonic was used to take down the gastroepiploics and the edge of the lesser curve and greater curve were exposed.  Two green loads of the GIA 75 mm stapler were used to divide the stomach.  The harmonic was then used to dissect  superiorly and inferiorly.  The duodenal bulb was very involved with tumor.  The proximal duodenum was dissected with the harmonic scalpel as well.  The bulb was adherent to the anterior pancreas.  This was freed up with the harmonic.  The duodenal bulb was divided with the TA 60.   The staple line of the stomach was oversewn with 3-0 PDS suture.  A few small nodules were taken with the bovie as well.  The gallbladder was taken down with the cautery.  The overholdt was used to assist with dissection. The cystic duct was clipped with the locking purple clip.  The cystic artery was divided with the harmonic.    The gastrojejunostomy was then performed.  The jejunum was identified around 20 cm from the ligament of treitz.  A site for the [place] was identified.  This was antecolic and retrogastric.  The jejunum was secured to the posterior wall of the stomach with two 3-0 silk sutures.  The stomach and jejunum were opened, and the 75 mm stapler was used to create a stapled linear anastamosis. The NGT was advanced into the efferent limb of the jejunum, and the defect was closed with two 3-0 PDS sutures in connell fashion.  The NGT was secured.  Additional silk sutures were used as anti-tension sutures.    An appropriate site was identified for the J tube.  A pursestring suture was placed with a 2-0 silk.  The J tube was pulled through the abdominal  wall.  This was secured with a nylon suture.  The tube was then witzeled in the jejunum and pexed to the abdominal wall.    The abdomen was then irrigated.  A sponge count was performed.  The anterior pancreas did appear a little beat up. Several 2-0 silks were placed and evicel was used to reinforce.  A 19 fr blake drain was placed near here and omentum was placed over the top.    The fascia was then closed with two #1 looped PDS sutures.  The skin was irrigated and closed with staples.    The abdomen was then cleaned, dried, and dressed with dry sterile dressings.     Needle, sponge, and instrument counts were correct x 2.  The patient was allowed to emerge from anesthesia and taken to the PACU in stable condition.

## 2017-01-11 ENCOUNTER — Encounter (HOSPITAL_COMMUNITY): Payer: Self-pay | Admitting: General Surgery

## 2017-01-11 LAB — COMPREHENSIVE METABOLIC PANEL
ALT: 37 U/L (ref 17–63)
AST: 32 U/L (ref 15–41)
Albumin: 3 g/dL — ABNORMAL LOW (ref 3.5–5.0)
Alkaline Phosphatase: 45 U/L (ref 38–126)
Anion gap: 6 (ref 5–15)
BUN: 8 mg/dL (ref 6–20)
CO2: 29 mmol/L (ref 22–32)
Calcium: 8.5 mg/dL — ABNORMAL LOW (ref 8.9–10.3)
Chloride: 102 mmol/L (ref 101–111)
Creatinine, Ser: 0.99 mg/dL (ref 0.61–1.24)
GFR calc Af Amer: >60 mL/min (ref >60)
GFR calc non Af Amer: >60 mL/min (ref >60)
Glucose, Bld: 169 mg/dL — ABNORMAL HIGH (ref 65–99)
Potassium: 4.3 mmol/L (ref 3.5–5.1)
Sodium: 137 mmol/L (ref 135–145)
Total Bilirubin: 1 mg/dL (ref 0.3–1.2)
Total Protein: 6 g/dL — ABNORMAL LOW (ref 6.5–8.1)

## 2017-01-11 LAB — CBC
HCT: 35.1 % — ABNORMAL LOW (ref 39.0–52.0)
Hemoglobin: 11.1 g/dL — ABNORMAL LOW (ref 13.0–17.0)
MCH: 25.5 pg — ABNORMAL LOW (ref 26.0–34.0)
MCHC: 31.6 g/dL (ref 30.0–36.0)
MCV: 80.5 fL (ref 78.0–100.0)
Platelets: 177 10*3/uL (ref 150–400)
RBC: 4.36 MIL/uL (ref 4.22–5.81)
RDW: 15.4 % (ref 11.5–15.5)
WBC: 10.4 10*3/uL (ref 4.0–10.5)

## 2017-01-11 LAB — PROTIME-INR
INR: 1.28
Prothrombin Time: 16.1 seconds — ABNORMAL HIGH (ref 11.4–15.2)

## 2017-01-11 LAB — PHOSPHORUS: Phosphorus: 3.6 mg/dL (ref 2.5–4.6)

## 2017-01-11 LAB — MAGNESIUM: Magnesium: 1.7 mg/dL (ref 1.7–2.4)

## 2017-01-11 MED ORDER — PHENOL 1.4 % MT LIQD
1.0000 | OROMUCOSAL | Status: DC | PRN
  Filled 2017-01-11: qty 177

## 2017-01-11 MED ORDER — PNEUMOCOCCAL VAC POLYVALENT 25 MCG/0.5ML IJ INJ
0.5000 mL | INJECTION | INTRAMUSCULAR | Status: AC
  Administered 2017-01-14: 0.5 mL via INTRAMUSCULAR
  Filled 2017-01-11: qty 0.5

## 2017-01-11 NOTE — Care Management Note (Addendum)
Case Management Note  Patient Details  Name: [name] MRN: [id number] Date of Birth: 1951/06/16  Subjective/Objective:  S/p   Distal gastrectomy, jejunum feeding tube placement, cholecystectomy. NCM spoke with wife and patient at the bedside, [name] [id number]. Wife states they have two residences one in [place] and one in [place], the patient will be going to [place] at [employer] with her.  He has a 3 n1 and a rolling walker at home, and they will get a recliner from a family member.  Per pt eval no pt f/u needed. Wife states if he does need any HH services they would like [place] which is now [facility].  He has medication coverage and pcp is Dr. Emilee Hero- Bonsu.  NCM will cont to follow for dc needs.                    Action/Plan:   Expected Discharge Date:                  Expected Discharge Plan:     In-House Referral:     Discharge planning Services  CM Consult  Post Acute Care Choice:    Choice offered to:     DME Arranged:    DME Agency:     HH Arranged:    HH Agency:     Status of Service:  In process, will continue to follow  If discussed at Long Length of Stay Meetings, dates discussed:    Additional Comments:  Zenon Mayo, RN 01/11/2017, 7:36 AM

## 2017-01-11 NOTE — Progress Notes (Signed)
1 Day Post-Op  Subjective: Denies pain.  States that he knows epidural is working well because the bottle was out for around 30 min or so and he definitely felt the difference.  He denies n/v.    Objective: Vital signs in last 24 hours: Temp:  [97.8 F (36.6 C)-98.9 F (37.2 C)] 98.9 F (37.2 C) (02/28 2003) Pulse Rate:  [83-99] 99 (02/28 2003) Resp:  [12-19] 19 (02/28 2003) BP: (138-144)/(75-83) 142/77 (02/28 2003) SpO2:  [96 %-100 %] 96 % (02/28 2003)    Intake/Output from previous day: 02/27 0701 - 02/28 0700 In: 2330.3 [I.V.:2155; NG/GT:20] Out: 2075 [Urine:760; Emesis/NG output:100; Drains:165; Blood:150] Intake/Output this shift: Total I/O In: 20 [NG/GT:20] Out: 210 [Urine:150; Emesis/NG output:50; Drains:10]  General appearance: alert, cooperative and no distress Resp: breathing comfortably Cardio: regular rate and rhythm GI: soft, approp tender.  drain serosang.  J tube in place.   Extremities: extremities normal, atraumatic, no cyanosis or edema  Lab Results:   Recent Labs  01/09/17 1356 01/11/17 0240  WBC 6.1 10.4  HGB 11.9* 11.1*  HCT 37.5* 35.1*  PLT 221 177   BMET  Recent Labs  01/09/17 1356 01/11/17 0240  NA 137 137  K 3.8 4.3  CL 102 102  CO2 24 29  GLUCOSE 111* 169*  BUN 12 8  CREATININE 1.11 0.99  CALCIUM 10.0 8.5*   PT/INR  Recent Labs  01/09/17 1356 01/11/17 0240  LABPROT 14.9 16.1*  INR 1.16 1.28   ABG No results for input(s): PHART, HCO3 in the last 72 hours.  Invalid input(s): PCO2, PO2  Studies/Results: No results found.  Anti-infectives: Anti-infectives    Start     Dose/Rate Route Frequency Ordered Stop   01/10/17 1431  ceFAZolin (ANCEF) 2-4 GM/100ML-% IVPB    Comments:  Moring, Taylor   : cabinet override      01/10/17 1431 01/11/17 0244   01/10/17 1430  ceFAZolin (ANCEF) IVPB 2g/100 mL premix     2 g 200 mL/hr over 30 Minutes Intravenous Every 8 hours 01/10/17 1425 01/10/17 1502   01/10/17 0700  ceFAZolin  (ANCEF) 3 g in dextrose 5 % 50 mL IVPB     3 g 130 mL/hr over 30 Minutes Intravenous On call to O.R. 01/09/17 1210 01/10/17 0810      Assessment/Plan: s/p Procedure(s): DISTAL GASTRECTOMY, JEJUNUM  FEEDING TUBE PLACEMENT (N/A) CHOLECYSTECTOMY (N/A) PAS Continue foley due to epidural in place NPO/NGT  Nutrition consult Start trophic feeds.     LOS: 1 day    [facility] 01/11/2017

## 2017-01-11 NOTE — Progress Notes (Signed)
Anesthesiology Follow-up:  Awake and alert, good pain relief with epidural ropivacaine 0.2% at 10 cc/hr. Needed to use PCA when epidural bag ran out earlier this morning.  Denies LE numbness.  VS: T- 36.8 BP- 139/75 RR- 12 HR- 84 (SR) O2 sat 100% on 2 L [place].  Pain control satisfactory with epidural ropivacaine at 10 cc/hr. Will continue present management today.  Nursing staff urged to use caution and to assist him with weight bearing.  [name]

## 2017-01-11 NOTE — Addendum Note (Signed)
Addendum  created 01/11/17 [id number] by Roberts Gaudy, MD   Sign clinical note

## 2017-01-12 ENCOUNTER — Encounter (HOSPITAL_COMMUNITY): Payer: Self-pay | Admitting: Anesthesiology

## 2017-01-12 DIAGNOSIS — K311 Adult hypertrophic pyloric stenosis: Secondary | ICD-10-CM

## 2017-01-12 DIAGNOSIS — E43 Unspecified severe protein-calorie malnutrition: Secondary | ICD-10-CM | POA: Insufficient documentation

## 2017-01-12 HISTORY — DX: Adult hypertrophic pyloric stenosis: K31.1

## 2017-01-12 LAB — COMPREHENSIVE METABOLIC PANEL
ALT: 27 U/L (ref 17–63)
AST: 25 U/L (ref 15–41)
Albumin: 2.7 g/dL — ABNORMAL LOW (ref 3.5–5.0)
Alkaline Phosphatase: 60 U/L (ref 38–126)
Anion gap: 5 (ref 5–15)
BUN: 5 mg/dL — ABNORMAL LOW (ref 6–20)
CO2: 28 mmol/L (ref 22–32)
Calcium: 8.2 mg/dL — ABNORMAL LOW (ref 8.9–10.3)
Chloride: 104 mmol/L (ref 101–111)
Creatinine, Ser: 0.98 mg/dL (ref 0.61–1.24)
GFR calc Af Amer: >60 mL/min (ref >60)
GFR calc non Af Amer: >60 mL/min (ref >60)
Glucose, Bld: 153 mg/dL — ABNORMAL HIGH (ref 65–99)
Potassium: 4.5 mmol/L (ref 3.5–5.1)
Sodium: 137 mmol/L (ref 135–145)
Total Bilirubin: 1.1 mg/dL (ref 0.3–1.2)
Total Protein: 5.8 g/dL — ABNORMAL LOW (ref 6.5–8.1)

## 2017-01-12 LAB — CBC
HCT: 34.3 % — ABNORMAL LOW (ref 39.0–52.0)
Hemoglobin: 10.9 g/dL — ABNORMAL LOW (ref 13.0–17.0)
MCH: 25.6 pg — ABNORMAL LOW (ref 26.0–34.0)
MCHC: 31.8 g/dL (ref 30.0–36.0)
MCV: 80.5 fL (ref 78.0–100.0)
Platelets: 160 10*3/uL (ref 150–400)
RBC: 4.26 MIL/uL (ref 4.22–5.81)
RDW: 15.6 % — ABNORMAL HIGH (ref 11.5–15.5)
WBC: 12.2 10*3/uL — ABNORMAL HIGH (ref 4.0–10.5)

## 2017-01-12 LAB — MAGNESIUM
Magnesium: 1.7 mg/dL (ref 1.7–2.4)
Magnesium: 1.7 mg/dL (ref 1.7–2.4)

## 2017-01-12 LAB — GLUCOSE, CAPILLARY
Glucose-Capillary: 160 mg/dL — ABNORMAL HIGH (ref 65–99)
Glucose-Capillary: 172 mg/dL — ABNORMAL HIGH (ref 65–99)
Glucose-Capillary: 168 mg/dL — ABNORMAL HIGH (ref 65–99)

## 2017-01-12 LAB — PHOSPHORUS
Phosphorus: 1.5 mg/dL — ABNORMAL LOW (ref 2.5–4.6)
Phosphorus: 1.4 mg/dL — ABNORMAL LOW (ref 2.5–4.6)

## 2017-01-12 MED ORDER — JEVITY 1.2 CAL PO LIQD
1000.0000 mL | ORAL | Status: DC
  Filled 2017-01-12: qty 1000

## 2017-01-12 MED ORDER — JEVITY 1.2 CAL PO LIQD
1000.0000 mL | ORAL | Status: DC
  Administered 2017-01-12: 1000 mL
  Filled 2017-01-12 (×3): qty 1000

## 2017-01-12 MED ORDER — ROPIVACAINE HCL 2 MG/ML IJ SOLN
10.0000 mL/h | INTRAMUSCULAR | Status: AC
Start: 2017-01-12 — End: 2017-01-14
  Administered 2017-01-12: 10 mL/h via EPIDURAL
  Filled 2017-01-12 (×4): qty 200

## 2017-01-12 MED ORDER — PRO-STAT SUGAR FREE PO LIQD
60.0000 mL | Freq: Two times a day (BID) | ORAL | Status: DC
  Administered 2017-01-12 – 2017-01-25 (×22): 60 mL
  Filled 2017-01-12 (×24): qty 60

## 2017-01-12 NOTE — Progress Notes (Signed)
Initial Nutrition Assessment  DOCUMENTATION CODES:   Severe malnutrition in context of chronic illness  INTERVENTION:  - Initiate TF of Jevity 1.2 at 10 mL/hr (trophic rate per MD) via J-tube, when medically able advance rate by 10 mL q 4 hrs to a goal rate of 70 mL/hr (1680 mL per day) - Add 60 mL Pro-Stat BID - TF at goal rate + Pro-stat will provide 2416 calories, 153 grams protein, and 1361 mL free water daily  NUTRITION DIAGNOSIS:   Malnutrition related to chronic illness (gastric cancer) as evidenced by moderate depletions of muscle mass, percent weight loss (11% in 1 mo), energy intake < or equal to 75% for > or equal to 1 month, mild fluid accumulation.  GOAL:   Patient will meet greater than or equal to 90% of their needs  MONITOR:   I & O's, Weight trends, TF tolerance  REASON FOR ASSESSMENT:   Consult Enteral/tube feeding initiation and management  ASSESSMENT:   Pt with PMH of pre-diabetes, GERD and anemia, presented with a gastric outlet obstruction and gastric cancer. Pt had distal gastrectomy, cholecystectomy, placement of jejunostomy on 2/27   Discussed pt with RN about initiation of TF.  Pt's family at bedside, wife and sister-in-law, assisted in collecting pt history.  Pt reports recent weight loss of 60 lbs in 2-3 mo time period. Per chart review (MD note and weight history), pt has experienced significant recent weight loss. Per weight readings, in one month patient has lost 32 lbs (11% weight loss-significant for time frame)  Wt Readings from Last 10 Encounters:  01/12/17 268 lb 15.4 oz (122 kg)  01/09/17 268 lb 7 oz (121.8 kg)  01/09/17 270 lb (122.5 kg)  12/30/16 300 lb (136.1 kg)   Pt reports no appetite currently and a continuous recent decrease in appetite. PTA pt reports consuming only 1 Ensure and 1 smoothie per day over the past month.  Pt reports the last solid food he consumed was 1 mo ago. Pt has been NPO for the past 2 days.  Labs  reviewed; serum glucose (111-169) Medications reviewed; Protonix, Transderm-scop  Nutrition-Focused Physical exam completed. Findings are no fat depletion, mild to moderate muscle depletion, and mild edema.   Diet Order:  Diet NPO time specified  Skin:  Reviewed, no issues  Last BM:  No BM dated  Height:   Ht Readings from Last 1 Encounters:  01/10/17 5\' 11"  (1.803 m)    Weight:   Wt Readings from Last 1 Encounters:  01/12/17 268 lb 15.4 oz (122 kg)    Ideal Body Weight:  78 kg  BMI:  Body mass index is 37.51 kg/m.  Estimated Nutritional Needs:   Kcal:  2400-2600  Protein:  140-160 grams  Fluid:  >/= 2 L/d  EDUCATION NEEDS:   Education needs addressed  [employer] Ranger Dietetic Intern

## 2017-01-12 NOTE — Progress Notes (Addendum)
Patient states feeling nauseated and displaying severe hiccups.  States "I feel like I can taste this new liquid they started".  He is referring to the tube feeding started today through his J-tube.  Feeding stopped by RN.  Patient was administered PRN dose of zofran and scheduled protonix.  MD notified via text page.    2230:Patient states feeling relief from symptoms until he moved to get his urinal.  Symptoms subsided again at rest.

## 2017-01-12 NOTE — Progress Notes (Signed)
2 Days Post-Op  Subjective: Pain control remains good.  No n/v.    Objective: Vital signs in last 24 hours: Temp:  [97.8 F (36.6 C)-99.3 F (37.4 C)] 99.3 F (37.4 C) (03/01 0326) Pulse Rate:  [83-100] 91 (03/01 0326) Resp:  [13-19] 17 (03/01 0400) BP: (132-144)/(71-83) 137/71 (03/01 0326) SpO2:  [96 %-100 %] 96 % (03/01 0400) Weight:  [122 kg (268 lb 15.4 oz)] 122 kg (268 lb 15.4 oz) (03/01 0326)    Intake/Output from previous day: 02/28 0701 - 03/01 0700 In: 2770 [I.V.:2500; NG/GT:20] Out: 1145 [Urine:900; Emesis/NG output:200; Drains:45] Intake/Output this shift: No intake/output data recorded.  General appearance: alert, cooperative and no distress Resp: breathing comfortably Cardio: regular rate and rhythm GI: soft, approp tender.  drain serosang.  J tube in place.   Extremities: extremities normal, atraumatic, no cyanosis or edema  Lab Results:   Recent Labs  01/11/17 0240 01/12/17 0227  WBC 10.4 12.2*  HGB 11.1* 10.9*  HCT 35.1* 34.3*  PLT 177 160   BMET  Recent Labs  01/11/17 0240 01/12/17 0227  NA 137 137  K 4.3 4.5  CL 102 104  CO2 29 28  GLUCOSE 169* 153*  BUN 8 5*  CREATININE 0.99 0.98  CALCIUM 8.5* 8.2*   PT/INR  Recent Labs  01/09/17 1356 01/11/17 0240  LABPROT 14.9 16.1*  INR 1.16 1.28   ABG No results for input(s): PHART, HCO3 in the last 72 hours.  Invalid input(s): PCO2, PO2  Studies/Results: No results found.  Anti-infectives: Anti-infectives    Start     Dose/Rate Route Frequency Ordered Stop   01/10/17 1431  ceFAZolin (ANCEF) 2-4 GM/100ML-% IVPB    Comments:  Moring, Taylor   : cabinet override      01/10/17 1431 01/11/17 0244   01/10/17 1430  ceFAZolin (ANCEF) IVPB 2g/100 mL premix     2 g 200 mL/hr over 30 Minutes Intravenous Every 8 hours 01/10/17 1425 01/10/17 1502   01/10/17 0700  ceFAZolin (ANCEF) 3 g in dextrose 5 % 50 mL IVPB     3 g 130 mL/hr over 30 Minutes Intravenous On call to O.R. 01/09/17 1210  01/10/17 0810      Assessment/Plan: s/p Procedure(s): DISTAL GASTRECTOMY, JEJUNUM  FEEDING TUBE PLACEMENT (N/A) CHOLECYSTECTOMY (N/A) PAS D/c NGT. Start tube feeds.      LOS: 2 days    [facility] 01/12/2017

## 2017-01-12 NOTE — Anesthesia Post-op Follow-up Note (Signed)
  Anesthesia Pain Follow-up Note  Patient: [name]  Day #: 3  Date of Follow-up: 01/12/2017 Time: 2:03 PM  Last Vitals:  Vitals:   01/12/17 0800 01/12/17 1222  BP: 134/71 127/74  Pulse: 93 (!) 101  Resp: 16 17  Temp: 36.9 C 37.1 C    Level of Consciousness: alert  Pain: mild   Side Effects:None  Catheter Site Exam:clean, dry     Plan: Continue current therapy of postop epidural at surgeon's request  Rogena Deupree,JAMES TERRILL

## 2017-01-12 NOTE — Progress Notes (Signed)
Anesthesiology Follow-up:  Awake and alert, pain control satisfactory with epidural ropivacaine at 10 cc/hr. Foley removed this morning, voiding without difficulty. NG removed today.  VS: T- 37.1 BP- 134/75 HR- 94 RR- 17 O2 Sat- 98% on RA  Will continue epidural ropivacaine today and d/c in AM.  Roberts Gaudy

## 2017-01-12 NOTE — Evaluation (Signed)
Physical Therapy Evaluation Patient Details Name: [name] MRN: [id number] DOB: 30-Sep-1951 Today's Date: 01/12/2017   History of Present Illness  Pt is a 66 y/o male s/p distal gastrectomy and cholescystectomy secondary to gastric cancer. No pertinent PMH.  Clinical Impression  Pt presented supine in bed with HOB elevated, awake and willing to participate in therapy session. Prior to admission, pt reported that he was independent with all functional mobility and ADLs. Pt moving very well during evaluation with minimal pain (reported 3/10). All VSS throughout. Pt would continue to benefit from skilled physical therapy services at this time while admitted to address his limitations in order to improve his overall safety and independence with functional mobility to ensure a safe d/c home.      Follow Up Recommendations No PT follow up;Supervision/Assistance - 24 hour    Equipment Recommendations  None recommended by PT;Other (comment) (pt has all DME at home)    Recommendations for Other Services       Precautions / Restrictions Precautions Precautions: Fall Precaution Comments: epidural, abdominal drain, NG tube to wall suction Restrictions Weight Bearing Restrictions: No      Mobility  Bed Mobility Overal bed mobility: Needs Assistance Bed Mobility: Rolling;Sidelying to Sit Rolling: Min guard Sidelying to sit: Min assist       General bed mobility comments: pt required increased time, use of bed rails, VC'ing for sequencing and min A at trunk to achieve sitting EOB  Transfers Overall transfer level: Needs assistance Equipment used: Rolling walker (2 wheeled) Transfers: Sit to/from Stand Sit to Stand: Min assist         General transfer comment: increased time, good technique, min A for stability with transition  Ambulation/Gait Ambulation/Gait assistance: Min guard Ambulation Distance (Feet): 300 Feet Assistive device: Rolling walker (2 wheeled) Gait  Pattern/deviations: Step-through pattern Gait velocity: decreased Gait velocity interpretation: Below normal speed for age/gender General Gait Details: gait stable with RW, min guard for safety  Stairs            Wheelchair Mobility    Modified Rankin (Stroke Patients Only)       Balance Overall balance assessment: Needs assistance Sitting-balance support: Feet supported Sitting balance-Leahy Scale: Good     Standing balance support: During functional activity Standing balance-Leahy Scale: Fair                               Pertinent Vitals/Pain Pain Assessment: 0-10 Pain Score: 3  Pain Location: abdomen Pain Descriptors / Indicators: Sore Pain Intervention(s): Monitored during session;Repositioned    Home Living Family/patient expects to be discharged to:: Private residence Living Arrangements: Spouse/significant other Available Help at Discharge: Family;Available 24 hours/day Type of Home: House Home Access: Stairs to enter Entrance Stairs-Rails: Right;Left;Can reach both Entrance Stairs-Number of Steps: 3 Home Layout: One level Home Equipment: Walker - 2 wheels;Cane - single point;Shower seat      Prior Function Level of Independence: Independent               Hand Dominance        Extremity/Trunk Assessment   Upper Extremity Assessment Upper Extremity Assessment: Overall WFL for tasks assessed    Lower Extremity Assessment Lower Extremity Assessment: Overall WFL for tasks assessed       Communication   Communication: No difficulties  Cognition Arousal/Alertness: Awake/alert Behavior During Therapy: WFL for tasks assessed/performed Overall Cognitive Status: Within Functional Limits for tasks assessed  General Comments      Exercises     Assessment/Plan    PT Assessment Patient needs continued PT services  PT Problem List Decreased balance;Decreased mobility;Decreased  coordination;Decreased knowledge of use of DME;Decreased safety awareness;Pain       PT Treatment Interventions DME instruction;Gait training;Stair training;Functional mobility training;Therapeutic activities;Therapeutic exercise;Balance training;Neuromuscular re-education;Patient/family education    PT Goals (Current goals can be found in the Care Plan section)  Acute Rehab PT Goals Patient Stated Goal: return home PT Goal Formulation: With patient/family Time For Goal Achievement: 01/26/17 Potential to Achieve Goals: Good    Frequency Min 3X/week   Barriers to discharge        Co-evaluation               End of Session Equipment Utilized During Treatment: Gait belt Activity Tolerance: Patient tolerated treatment well Patient left: in chair;with call bell/phone within reach;with family/visitor present Nurse Communication: Mobility status PT Visit Diagnosis: Other abnormalities of gait and mobility (R26.89);Pain Pain - part of body:  (abdomen)         Time: [id number] PT Time Calculation (min) (ACUTE ONLY): 27 min   Charges:   PT Evaluation $PT Eval Moderate Complexity: 1 Procedure PT Treatments $Gait Training: 8-22 mins   PT G CodesClearnce Sorrel Thurza Kwiecinski 01/12/2017, 10:23 AM Sherie Don, PT, DPT [phone]

## 2017-01-13 ENCOUNTER — Other Ambulatory Visit: Payer: Self-pay | Admitting: General Surgery

## 2017-01-13 ENCOUNTER — Inpatient Hospital Stay (HOSPITAL_COMMUNITY): Payer: BLUE CROSS/BLUE SHIELD

## 2017-01-13 LAB — CBC
HCT: 31.5 % — ABNORMAL LOW (ref 39.0–52.0)
Hemoglobin: 10.1 g/dL — ABNORMAL LOW (ref 13.0–17.0)
MCH: 25.6 pg — ABNORMAL LOW (ref 26.0–34.0)
MCHC: 32.1 g/dL (ref 30.0–36.0)
MCV: 79.7 fL (ref 78.0–100.0)
Platelets: 242 10*3/uL (ref 150–400)
RBC: 3.95 MIL/uL — ABNORMAL LOW (ref 4.22–5.81)
RDW: 15.4 % (ref 11.5–15.5)
WBC: 13 10*3/uL — ABNORMAL HIGH (ref 4.0–10.5)

## 2017-01-13 LAB — PHOSPHORUS
Phosphorus: 1.3 mg/dL — ABNORMAL LOW (ref 2.5–4.6)
Phosphorus: 2 mg/dL — ABNORMAL LOW (ref 2.5–4.6)

## 2017-01-13 LAB — COMPREHENSIVE METABOLIC PANEL
ALT: 23 U/L (ref 17–63)
AST: 28 U/L (ref 15–41)
Albumin: 2.6 g/dL — ABNORMAL LOW (ref 3.5–5.0)
Alkaline Phosphatase: 62 U/L (ref 38–126)
Anion gap: 10 (ref 5–15)
BUN: 15 mg/dL (ref 6–20)
CO2: 24 mmol/L (ref 22–32)
Calcium: 8.5 mg/dL — ABNORMAL LOW (ref 8.9–10.3)
Chloride: 103 mmol/L (ref 101–111)
Creatinine, Ser: 1.06 mg/dL (ref 0.61–1.24)
GFR calc Af Amer: >60 mL/min (ref >60)
GFR calc non Af Amer: >60 mL/min (ref >60)
Glucose, Bld: 208 mg/dL — ABNORMAL HIGH (ref 65–99)
Potassium: 3.9 mmol/L (ref 3.5–5.1)
Sodium: 137 mmol/L (ref 135–145)
Total Bilirubin: 1 mg/dL (ref 0.3–1.2)
Total Protein: 6.1 g/dL — ABNORMAL LOW (ref 6.5–8.1)

## 2017-01-13 LAB — MAGNESIUM
Magnesium: 1.8 mg/dL (ref 1.7–2.4)
Magnesium: 1.7 mg/dL (ref 1.7–2.4)

## 2017-01-13 LAB — GLUCOSE, CAPILLARY
Glucose-Capillary: 182 mg/dL — ABNORMAL HIGH (ref 65–99)
Glucose-Capillary: 143 mg/dL — ABNORMAL HIGH (ref 65–99)
Glucose-Capillary: 131 mg/dL — ABNORMAL HIGH (ref 65–99)

## 2017-01-13 MED ORDER — SODIUM CHLORIDE 0.9 % IV SOLN
25.0000 mg | Freq: Four times a day (QID) | INTRAVENOUS | Status: DC | PRN
  Administered 2017-01-13 – 2017-01-25 (×8): 25 mg via INTRAVENOUS
  Filled 2017-01-13 (×18): qty 1

## 2017-01-13 NOTE — Progress Notes (Signed)
Upon entering room, patient stated to RN that he had "thrown up".  RN observed a large amount of what appeared to be projectile vomit that was dark red/brown in color.  The patient made the comment that he "threw up blueberries and I haven't had those in days".  Upon further observation, the "blueberries" looked to be clumps of blood.  Patient was cleaned up and linens changed.  Patient agreed that he "feels better now that his stomach isn't full" and that he felt "less distended" than he did earlier this shift.  He also states concern that we started the tube feedings too soon and he was worried something like this would happen. Patient denies pain, SOB, and further nausea.  He is otherwise comfortable.  MD notified of this event. No new orders at this time.  Will continue to monitor.

## 2017-01-13 NOTE — Progress Notes (Signed)
3 Days Post-Op  Subjective: Had a bad night.  Threw up a lot of material and got hiccups.    Objective: Vital signs in last 24 hours: Temp:  [97.8 F (36.6 C)-99.5 F (37.5 C)] 99.5 F (37.5 C) (03/02 1240) Pulse Rate:  [94-125] 114 (03/02 1240) Resp:  [15-28] 15 (03/02 1240) BP: (100-134)/(62-75) 125/62 (03/02 1240) SpO2:  [96 %-100 %] 98 % (03/02 1240) Weight:  [124.9 kg (275 lb 5.7 oz)] 124.9 kg (275 lb 5.7 oz) (03/02 0309)    Intake/Output from previous day: 03/01 0701 - 03/02 0700 In: 2761.7 [I.V.:2400; NG/GT:61.8] Out: [id number] [Urine:1050; Emesis/NG output:100; Drains:95] Intake/Output this shift: Total I/O In: 666.7 [I.V.:600; Other:41.7; IV Piggyback:25] Out: 105 [Emesis/NG output:100; Drains:5]  General appearance: alert, cooperative, looks uncomfortable Resp: unlabored breathing Cardio: regular rate and rhythm GI: soft, approp tender.  drain serosang.  J tube in place.   Extremities: extremities normal, atraumatic, no cyanosis or edema  Lab Results:   Recent Labs  01/12/17 0227 01/13/17 0422  WBC 12.2* 13.0*  HGB 10.9* 10.1*  HCT 34.3* 31.5*  PLT 160 242   BMET  Recent Labs  01/12/17 0227 01/13/17 0422  NA 137 137  K 4.5 3.9  CL 104 103  CO2 28 24  GLUCOSE 153* 208*  BUN 5* 15  CREATININE 0.98 1.06  CALCIUM 8.2* 8.5*   PT/INR  Recent Labs  01/11/17 0240  LABPROT 16.1*  INR 1.28   ABG No results for input(s): PHART, HCO3 in the last 72 hours.  Invalid input(s): PCO2, PO2  Studies/Results: No results found.  Anti-infectives: Anti-infectives    Start     Dose/Rate Route Frequency Ordered Stop   01/10/17 1431  ceFAZolin (ANCEF) 2-4 GM/100ML-% IVPB    Comments:  Moring, Taylor   : cabinet override      01/10/17 1431 01/11/17 0244   01/10/17 1430  ceFAZolin (ANCEF) IVPB 2g/100 mL premix     2 g 200 mL/hr over 30 Minutes Intravenous Every 8 hours 01/10/17 1425 01/10/17 1502   01/10/17 0700  ceFAZolin (ANCEF) 3 g in dextrose 5 % 50 mL  IVPB     3 g 130 mL/hr over 30 Minutes Intravenous On call to O.R. 01/09/17 1210 01/10/17 0810      Assessment/Plan: s/p Procedure(s): DISTAL GASTRECTOMY, JEJUNUM  FEEDING TUBE PLACEMENT (N/A) CHOLECYSTECTOMY (N/A) Did not tolerate tube feeds. Replace NGT Thorazine for hiccups.       LOS: 3 days    [facility] 01/13/2017

## 2017-01-13 NOTE — Progress Notes (Signed)
Nutrition Follow-up  DOCUMENTATION CODES:   Severe malnutrition in context of chronic illness  INTERVENTION:  - Resume tube feeding of Jevity 1.2 at 10 mL/hr when medically appropriate - Recommend advance by 10 mL q 4-8 hrs to goal rate of 70 mL/hr (1680 mL per day) as tolerated with Pro-stat 60 mL BID - Providing 2416 calories, 153 grams protein, and 1361 mL free water daily  NUTRITION DIAGNOSIS:   Malnutrition related to chronic illness as evidenced by moderate depletions of muscle mass, percent weight loss, energy intake < or equal to 75% for > or equal to 1 month, mild fluid accumulation.  Ongoing  GOAL:   Patient will meet greater than or equal to 90% of their needs  Unmet  MONITOR:   I & O's, Weight trends, TF tolerance  ASSESSMENT:   Pt with PMH of pre-diabetes, GERD and anemia, presented with a gastric outlet obstruction and gastric cancer. Pt had distal gastrectomy, cholecystectomy, placement of jejunostomy on 2/27  Pt NGT was removed 3/1 around 1700. Pt experienced emesis early this morning (3/2). Per RN report projectile vomit had a dark red/brown coloring, and seemed to contain clumps of blood. Pt expressed concern for continuing tube feeding because he thought he was tasting the tube feeding. Tube feeding was put on hold.  RD discussed pt with MD and RN. NGT was replaced this morning to aid in suction of stomach contents. Per telephone discussion with MD distal stomach, pylorus, and part of the duodenum were removed during surgery. Plan to resume tube feeding tomorrow 3/3 and evaluate pt's tolerance.   RD to continue to monitor and evaluate nutritional needs as necessary.   Labs reviewed; CBG [id number]), Phosphorus (1.3) Medications reviewed  Diet Order:  Diet NPO time specified  Skin:  Reviewed, no issues  Last BM:  3/2  Height:   Ht Readings from Last 1 Encounters:  01/10/17 5\' 11"  (1.803 m)    Weight:   Wt Readings from Last 1 Encounters:   01/13/17 275 lb 5.7 oz (124.9 kg)    Ideal Body Weight:  78 kg  BMI:  Body mass index is 38.4 kg/m.  Estimated Nutritional Needs:   Kcal:  2400-2600  Protein:  140-160 grams  Fluid:  >/= 2 L/d  EDUCATION NEEDS:   Education needs addressed  Parks Ranger Dietetic Intern

## 2017-01-13 NOTE — Progress Notes (Signed)
PT Cancellation Note  Patient Details Name: [name] MRN: [id number] DOB: 1951-01-26   Cancelled Treatment:    Reason Eval/Treat Not Completed: Fatigue/lethargy limiting ability to participate. Pt has had multiple procedures today and is finally able to rest. Requests PT hold off this afternoon. Will recheck as time allows.    Scheryl Marten PT, DPT  [phone]  01/13/2017, 2:51 PM

## 2017-01-13 NOTE — Progress Notes (Signed)
   01/13/17 1006  Vitals  BP 100/64  MAP (mmHg) 75  Pulse Rate (!) 125  ECG Heart Rate (!) 123  Resp (!) 24  paged dr Barry Dienes regarding pt HR jumped 125-135's while laying in bed, c/o small amount of pain, pca dose administered, no n/v, no hiccups at this time. New orders rec'd to get 12lead EKG, reinsert NG tube to int. LWS

## 2017-01-13 NOTE — Care Management Note (Addendum)
Case Management Note  Patient Details  Name: Derold Ramirezgarcia MRN: [id number] Date of Birth: 12-16-1950  Subjective/Objective:    S/p   Distal gastrectomy, jejunum feeding tube placement, cholecystectomy. NCM spoke with wife and patient at the bedside, Arval Mcguiness [id number]. Wife states they have two residences one in [place] and one in [place], the patient will be going to [place] at [employer] with her.  He has a 3 n1 and a rolling walker at home, and they will get a recliner from a family member.  Per pt eval no pt f/u needed. Wife states if he does need any HH services they would like [place] which is now [facility].  He has medication coverage and pcp is Dr. Emilee Hero- Bonsu.  NCM will cont to follow for dc needs.     3/2 [place], BSN - NG tube was taken out, yesterday 3/1, tube feeds started via j tube , patient began vomiting, tube feeds stopped, did not tolerate tube feeds per MD will put ng tube back in. NCM will cont to follow for dc needs.   3/5 [place], BSN -  POD 6 npo, NG tube, J tube feeds at 10 no advancing.  Has dilaudid pca ivf's. NCM will cont to follow for dc needs.                              Action/Plan:   Expected Discharge Date:                  Expected Discharge Plan:  [place]  In-House Referral:     Discharge planning Services  CM Consult  Post Acute Care Choice:    Choice offered to:     DME Arranged:    DME Agency:     HH Arranged:    [place] Agency:     Status of Service:  In process, will continue to follow  If discussed at Long Length of Stay Meetings, dates discussed:    Additional Comments:  Zenon Mayo, RN 01/13/2017, 12:53 PM

## 2017-01-13 NOTE — Progress Notes (Signed)
Anesthesiology Note:  Epidural catheter removed. Site OK tip intact.  Roberts Gaudy

## 2017-01-13 NOTE — Addendum Note (Signed)
Addendum  created 01/13/17 1140 by Roberts Gaudy, MD   Sign clinical note

## 2017-01-13 NOTE — Progress Notes (Signed)
PT Cancellation Note  Patient Details Name: [name] MRN: [id number] DOB: 05-19-1951   Cancelled Treatment:    Reason Eval/Treat Not Completed: Fatigue/lethargy limiting ability to participate. Pt was up late and vomiting last night. Pt reports that he is not feeling well or up to walking at this time, but may be up to mobility later this afternoon. PT will check back as time allows.    Scheryl Marten PT, DPT  [phone]  01/13/2017, 10:18 AM

## 2017-01-14 LAB — GLUCOSE, CAPILLARY
Glucose-Capillary: 113 mg/dL — ABNORMAL HIGH (ref 65–99)
Glucose-Capillary: 124 mg/dL — ABNORMAL HIGH (ref 65–99)
Glucose-Capillary: 127 mg/dL — ABNORMAL HIGH (ref 65–99)
Glucose-Capillary: 135 mg/dL — ABNORMAL HIGH (ref 65–99)
Glucose-Capillary: 135 mg/dL — ABNORMAL HIGH (ref 65–99)
Glucose-Capillary: 140 mg/dL — ABNORMAL HIGH (ref 65–99)
Glucose-Capillary: 154 mg/dL — ABNORMAL HIGH (ref 65–99)

## 2017-01-14 LAB — COMPREHENSIVE METABOLIC PANEL
ALT: 17 U/L (ref 17–63)
AST: 17 U/L (ref 15–41)
Albumin: 2.2 g/dL — ABNORMAL LOW (ref 3.5–5.0)
Alkaline Phosphatase: 55 U/L (ref 38–126)
Anion gap: 6 (ref 5–15)
BUN: 12 mg/dL (ref 6–20)
CO2: 26 mmol/L (ref 22–32)
Calcium: 8.2 mg/dL — ABNORMAL LOW (ref 8.9–10.3)
Chloride: 106 mmol/L (ref 101–111)
Creatinine, Ser: 0.9 mg/dL (ref 0.61–1.24)
GFR calc Af Amer: >60 mL/min (ref >60)
GFR calc non Af Amer: >60 mL/min (ref >60)
Glucose, Bld: 140 mg/dL — ABNORMAL HIGH (ref 65–99)
Potassium: 4 mmol/L (ref 3.5–5.1)
Sodium: 138 mmol/L (ref 135–145)
Total Bilirubin: 0.5 mg/dL (ref 0.3–1.2)
Total Protein: 5.2 g/dL — ABNORMAL LOW (ref 6.5–8.1)

## 2017-01-14 LAB — CBC
HCT: 25.9 % — ABNORMAL LOW (ref 39.0–52.0)
Hemoglobin: 8.1 g/dL — ABNORMAL LOW (ref 13.0–17.0)
MCH: 24.8 pg — ABNORMAL LOW (ref 26.0–34.0)
MCHC: 31.3 g/dL (ref 30.0–36.0)
MCV: 79.2 fL (ref 78.0–100.0)
Platelets: 216 10*3/uL (ref 150–400)
RBC: 3.27 MIL/uL — ABNORMAL LOW (ref 4.22–5.81)
RDW: 15.3 % (ref 11.5–15.5)
WBC: 7.2 10*3/uL (ref 4.0–10.5)

## 2017-01-14 NOTE — Progress Notes (Signed)
General Surgery [facility]  Assessment & Plan:  POD#4 - status post distal gastrectomy for carcinoma, cholecystectomy  NPO, NG decompression, IV hydration  Rx pain, nausea  Encouraged OOB, ambulation  Complains of right shoulder pain - will order K-pad        [name], MD, [facility]       Office: [phone]    Subjective: Patient more comfortable today, nausea and hiccups resolved.  Wife at bedside.  Objective: Vital signs in last 24 hours: Temp:  [97.9 F (36.6 C)-99.5 F (37.5 C)] 98.9 F (37.2 C) (03/03 0722) Pulse Rate:  [98-125] 98 (03/03 0722) Resp:  [12-27] 19 (03/03 0722) BP: (100-128)/(61-73) 122/68 (03/03 0722) SpO2:  [98 %-100 %] 98 % (03/03 0722) Weight:  [123.1 kg (271 lb 6.2 oz)] 123.1 kg (271 lb 6.2 oz) (03/03 0439)    Intake/Output from previous day: 03/02 0701 - 03/03 0700 In: 2501.7 [I.V.:2400; IV Piggyback:25] Out: 1410 [Urine:650; Emesis/NG output:750; Drains:10] Intake/Output this shift: Total I/O In: -  Out: 5 [Drains:5]  Physical Exam: HEENT - sclerae clear, mucous membranes moist Neck - soft Chest - clear bilaterally Cor - RRR Abdomen - soft, obese; midline wound dry and intact; JP RUQ with thin serous; J-tube LUQ capped; quiet to auscultation Neuro - alert & oriented, no focal deficits  Lab Results:   Recent Labs  01/13/17 0422 01/14/17 0346  WBC 13.0* 7.2  HGB 10.1* 8.1*  HCT 31.5* 25.9*  PLT 242 216   BMET  Recent Labs  01/13/17 0422 01/14/17 0346  NA 137 138  K 3.9 4.0  CL 103 106  CO2 24 26  GLUCOSE 208* 140*  BUN 15 12  CREATININE 1.06 0.90  CALCIUM 8.5* 8.2*   PT/INR No results for input(s): LABPROT, INR in the last 72 hours. Comprehensive Metabolic Panel:    Component Value Date/Time   NA 138 01/14/2017 0346   NA 137 01/13/2017 0422   K 4.0 01/14/2017 0346   K 3.9 01/13/2017 0422   CL 106 01/14/2017 0346   CL 103 01/13/2017 0422   CO2  26 01/14/2017 0346   CO2 24 01/13/2017 0422   BUN 12 01/14/2017 0346   BUN 15 01/13/2017 0422   CREATININE 0.90 01/14/2017 0346   CREATININE 1.06 01/13/2017 0422   GLUCOSE 140 (H) 01/14/2017 0346   GLUCOSE 208 (H) 01/13/2017 0422   CALCIUM 8.2 (L) 01/14/2017 0346   CALCIUM 8.5 (L) 01/13/2017 0422   AST 17 01/14/2017 0346   AST 28 01/13/2017 0422   ALT 17 01/14/2017 0346   ALT 23 01/13/2017 0422   ALKPHOS 55 01/14/2017 0346   ALKPHOS 62 01/13/2017 0422   BILITOT 0.5 01/14/2017 0346   BILITOT 1.0 01/13/2017 0422   PROT 5.2 (L) 01/14/2017 0346   PROT 6.1 (L) 01/13/2017 0422   ALBUMIN 2.2 (L) 01/14/2017 0346   ALBUMIN 2.6 (L) 01/13/2017 0422    Studies/Results: Dg Abd Portable 1v  Result Date: 01/13/2017 CLINICAL DATA:  NG tube placement, status post distal gastrectomy and jejunal feeding tube placement 01/10/2017, status post cholecystectomy EXAM: PORTABLE ABDOMEN - 1 VIEW COMPARISON:  12/26/2016 FINDINGS: There is normal small bowel gas pattern. There is NG feeding tube coiled within proximal stomach with tip in mid stomach. Skin staples are noted in right abdomen adjacent to midline. There is a probable drain in right abdomen. A catheter is noted in left mid abdomen probable percutaneous  jejunal tube. IMPRESSION: There is NG feeding tube coiled within proximal stomach with tip in mid stomach. Skin staples are noted in right abdomen adjacent to midline. There is a probable drain in right abdomen. A catheter is noted in left mid abdomen probable percutaneous jejunal tube. Electronically Signed   By: Lahoma Crocker M.D.   On: 01/13/2017 13:40      Angels M 01/14/2017  Patient ID: [name], male   DOB: [birth year], 66 y.o.   MRN: [id number]

## 2017-01-14 NOTE — Progress Notes (Signed)
Physical Therapy Treatment Patient Details Name: [name] MRN: [id number] DOB: March 06, 1951 Today's Date: 01/14/2017    History of Present Illness Pt is a 66 y/o male s/p distal gastrectomy and cholescystectomy secondary to gastric cancer. No pertinent PMH.    PT Comments    Pt requesting to return to bed after sitting up in recliner chair for approximately one hour. Pt's RN reported that he ambulated hallway of unit earlier today with assistance for IV pole. Pt agreeable to ambulate again later today. All VSS throughout. PT will continue to follow acutely to ensure a safe d/c home.   Follow Up Recommendations  No PT follow up;Supervision - Intermittent     Equipment Recommendations  None recommended by PT    Recommendations for Other Services       Precautions / Restrictions Precautions Precautions: Fall Precaution Comments: abdominal drain, NG tube to wall suction Restrictions Weight Bearing Restrictions: No    Mobility  Bed Mobility Overal bed mobility: Needs Assistance Bed Mobility: Sit to Supine       Sit to supine: Min assist   General bed mobility comments: increased time, min A to move bilateral UEs on to bed  Transfers Overall transfer level: Needs assistance Equipment used: Rolling walker (2 wheeled) Transfers: Sit to/from [facility] Sit to Stand: Min guard Stand pivot transfers: Min guard       General transfer comment: increased time, good technique, min guard for safety and line management  Ambulation/Gait             General Gait Details: deferred at this time secondary to fatigue. pt's RN reported that he ambulated the hallway of the unit early today and plans to ambulate again later today.   Stairs            Wheelchair Mobility    Modified Rankin (Stroke Patients Only)       Balance Overall balance assessment: Needs assistance Sitting-balance support: Feet supported Sitting balance-Leahy Scale: Good     Standing balance support: During functional activity Standing balance-Leahy Scale: Fair                      Cognition Arousal/Alertness: Awake/alert Behavior During Therapy: WFL for tasks assessed/performed Overall Cognitive Status: Within Functional Limits for tasks assessed                      Exercises      General Comments        Pertinent Vitals/Pain Pain Assessment: 0-10 Pain Score: 4  Pain Location: throat Pain Descriptors / Indicators: Sore Pain Intervention(s): Monitored during session;Repositioned    Home Living                      Prior Function            PT Goals (current goals can now be found in the care plan section) Acute Rehab PT Goals Patient Stated Goal: return home PT Goal Formulation: With patient/family Time For Goal Achievement: 01/26/17 Potential to Achieve Goals: Good Progress towards PT goals: Progressing toward goals    Frequency    Min 3X/week      PT Plan Current plan remains appropriate    Co-evaluation             End of Session   Activity Tolerance: Patient tolerated treatment well Patient left: in bed;with call bell/phone within reach;with family/visitor present Nurse Communication: Mobility status;Other (comment) (JP drain disconnected  during transfer) PT Visit Diagnosis: Other abnormalities of gait and mobility (R26.89);Pain Pain - part of body:  (abdomen and throat)     Time: [id number] PT Time Calculation (min) (ACUTE ONLY): 18 min  Charges:  $Therapeutic Activity: 8-22 mins                    G CodesClearnce Sorrel Quetzally Callas 12-Feb-2017, 3:18 PM Sherie Don, PT, DPT [phone]

## 2017-01-15 LAB — COMPREHENSIVE METABOLIC PANEL
ALT: 23 U/L (ref 17–63)
AST: 24 U/L (ref 15–41)
Albumin: 2.2 g/dL — ABNORMAL LOW (ref 3.5–5.0)
Alkaline Phosphatase: 68 U/L (ref 38–126)
Anion gap: 5 (ref 5–15)
BUN: 8 mg/dL (ref 6–20)
CO2: 25 mmol/L (ref 22–32)
Calcium: 8.3 mg/dL — ABNORMAL LOW (ref 8.9–10.3)
Chloride: 106 mmol/L (ref 101–111)
Creatinine, Ser: 0.82 mg/dL (ref 0.61–1.24)
GFR calc Af Amer: >60 mL/min (ref >60)
GFR calc non Af Amer: >60 mL/min (ref >60)
Glucose, Bld: 132 mg/dL — ABNORMAL HIGH (ref 65–99)
Potassium: 3.7 mmol/L (ref 3.5–5.1)
Sodium: 136 mmol/L (ref 135–145)
Total Bilirubin: 0.6 mg/dL (ref 0.3–1.2)
Total Protein: 5.4 g/dL — ABNORMAL LOW (ref 6.5–8.1)

## 2017-01-15 LAB — CBC
HCT: 24.6 % — ABNORMAL LOW (ref 39.0–52.0)
Hemoglobin: 7.8 g/dL — ABNORMAL LOW (ref 13.0–17.0)
MCH: 24.8 pg — ABNORMAL LOW (ref 26.0–34.0)
MCHC: 31.7 g/dL (ref 30.0–36.0)
MCV: 78.3 fL (ref 78.0–100.0)
Platelets: 227 10*3/uL (ref 150–400)
RBC: 3.14 MIL/uL — ABNORMAL LOW (ref 4.22–5.81)
RDW: 15.2 % (ref 11.5–15.5)
WBC: 6.2 10*3/uL (ref 4.0–10.5)

## 2017-01-15 LAB — GLUCOSE, CAPILLARY
Glucose-Capillary: 118 mg/dL — ABNORMAL HIGH (ref 65–99)
Glucose-Capillary: 133 mg/dL — ABNORMAL HIGH (ref 65–99)
Glucose-Capillary: 121 mg/dL — ABNORMAL HIGH (ref 65–99)
Glucose-Capillary: 122 mg/dL — ABNORMAL HIGH (ref 65–99)
Glucose-Capillary: 117 mg/dL — ABNORMAL HIGH (ref 65–99)
Glucose-Capillary: 126 mg/dL — ABNORMAL HIGH (ref 65–99)

## 2017-01-15 MED ORDER — LORAZEPAM 2 MG/ML IJ SOLN
1.0000 mg | Freq: Every evening | INTRAMUSCULAR | Status: DC | PRN
  Administered 2017-01-15 – 2017-01-20 (×4): 1 mg via INTRAVENOUS
  Filled 2017-01-15 (×4): qty 1

## 2017-01-15 NOTE — Progress Notes (Signed)
General Surgery [facility]  Assessment & Plan:  POD#5 - status post distal gastrectomy for carcinoma, cholecystectomy             NPO, NG decompression, IV hydration             Rx pain, nausea             Encouraged OOB, ambulation  Patient requests sleeper  Allow ice chips        [name], MD, [facility]       Office: [phone]    Subjective: Patient in bed, comfortable, likes K-pad.  No flatus or BM yet.  Objective: Vital signs in last 24 hours: Temp:  [97.4 F (36.3 C)-98.6 F (37 C)] 98.6 F (37 C) (03/04 0711) Pulse Rate:  [91-96] 96 (03/04 0711) Resp:  [12-20] 17 (03/04 0711) BP: (123-136)/(63-71) 134/69 (03/04 0711) SpO2:  [98 %-100 %] 99 % (03/04 0711) Weight:  [124.3 kg (274 lb 0.5 oz)] 124.3 kg (274 lb 0.5 oz) (03/04 0425)    Intake/Output from previous day: 03/03 0701 - 03/04 0700 In: 2550 [I.V.:2550] Out: 1345 [Urine:675; Emesis/NG output:450; Drains:220] Intake/Output this shift: Total I/O In: -  Out: 50 [Emesis/NG output:50]  Physical Exam: HEENT - sclerae clear, mucous membranes moist Neck - soft Chest - clear bilaterally Cor - RRR Abdomen - soft, obese; midline wound dry and intact; J-tube plugged; drain with small serous output Neuro - alert & oriented, no focal deficits  Lab Results:   Recent Labs  01/14/17 0346 01/15/17 0222  WBC 7.2 6.2  HGB 8.1* 7.8*  HCT 25.9* 24.6*  PLT 216 227   BMET  Recent Labs  01/14/17 0346 01/15/17 0222  NA 138 136  K 4.0 3.7  CL 106 106  CO2 26 25  GLUCOSE 140* 132*  BUN 12 8  CREATININE 0.90 0.82  CALCIUM 8.2* 8.3*   PT/INR No results for input(s): LABPROT, INR in the last 72 hours. Comprehensive Metabolic Panel:    Component Value Date/Time   NA 136 01/15/2017 0222   NA 138 01/14/2017 0346   K 3.7 01/15/2017 0222   K 4.0 01/14/2017 0346   CL 106 01/15/2017 0222   CL 106 01/14/2017 0346   CO2 25 01/15/2017 0222   CO2  26 01/14/2017 0346   BUN 8 01/15/2017 0222   BUN 12 01/14/2017 0346   CREATININE 0.82 01/15/2017 0222   CREATININE 0.90 01/14/2017 0346   GLUCOSE 132 (H) 01/15/2017 0222   GLUCOSE 140 (H) 01/14/2017 0346   CALCIUM 8.3 (L) 01/15/2017 0222   CALCIUM 8.2 (L) 01/14/2017 0346   AST 24 01/15/2017 0222   AST 17 01/14/2017 0346   ALT 23 01/15/2017 0222   ALT 17 01/14/2017 0346   ALKPHOS 68 01/15/2017 0222   ALKPHOS 55 01/14/2017 0346   BILITOT 0.6 01/15/2017 0222   BILITOT 0.5 01/14/2017 0346   PROT 5.4 (L) 01/15/2017 0222   PROT 5.2 (L) 01/14/2017 0346   ALBUMIN 2.2 (L) 01/15/2017 0222   ALBUMIN 2.2 (L) 01/14/2017 0346    Studies/Results: Dg Abd Portable 1v  Result Date: 01/13/2017 CLINICAL DATA:  NG tube placement, status post distal gastrectomy and jejunal feeding tube placement 01/10/2017, status post cholecystectomy EXAM: PORTABLE ABDOMEN - 1 VIEW COMPARISON:  12/26/2016 FINDINGS: There is normal small bowel gas pattern. There is NG feeding tube coiled within proximal stomach with tip in mid stomach. Skin staples  are noted in right abdomen adjacent to midline. There is a probable drain in right abdomen. A catheter is noted in left mid abdomen probable percutaneous jejunal tube. IMPRESSION: There is NG feeding tube coiled within proximal stomach with tip in mid stomach. Skin staples are noted in right abdomen adjacent to midline. There is a probable drain in right abdomen. A catheter is noted in left mid abdomen probable percutaneous jejunal tube. Electronically Signed   By: Lahoma Crocker M.D.   On: 01/13/2017 13:40      Uehling M 01/15/2017  Patient ID: [name], male   DOB: 1951/02/02, 66 y.o.   MRN: [id number]

## 2017-01-16 LAB — GLUCOSE, CAPILLARY
Glucose-Capillary: 134 mg/dL — ABNORMAL HIGH (ref 65–99)
Glucose-Capillary: 135 mg/dL — ABNORMAL HIGH (ref 65–99)
Glucose-Capillary: 140 mg/dL — ABNORMAL HIGH (ref 65–99)
Glucose-Capillary: 141 mg/dL — ABNORMAL HIGH (ref 65–99)
Glucose-Capillary: 154 mg/dL — ABNORMAL HIGH (ref 65–99)

## 2017-01-16 MED ORDER — HYDROMORPHONE HCL 2 MG/ML IJ SOLN: 0.5000 mg | INTRAMUSCULAR | Status: DC | PRN

## 2017-01-16 NOTE — Progress Notes (Signed)
Received pt via wheelchair from [address]. Pt is A&O x4, a soon as he came to the room, pt wants to get washed up  in the bathroom assisted by the NT.  Denies pain, mid abd incision with honeycomb dressing dry and intact. J-tube with cont tube feeding on at 43ml/hr. NGT intact, connected to LIS. JP drain to RUQ intact. Pt with limited movement to right shoulder due to arthritis, k-pad provided. Kept call light w/in reach.

## 2017-01-16 NOTE — Progress Notes (Signed)
Nutrition Follow-up  DOCUMENTATION CODES:   Severe malnutrition in context of chronic illness  INTERVENTION:  - Recommend advance TF, as tolerated, by 10 mL q 4-8 hrs to goal rate of 70 mL/hr (1680 mL per day) with Pro-stat 60 mL BID - Providing 2416calories, 153grams protein, and 1316mL free water daily  NUTRITION DIAGNOSIS:   Malnutrition related to chronic illness as evidenced by moderate depletions of muscle mass, percent weight loss, energy intake < or equal to 75% for > or equal to 1 month, mild fluid accumulation.  Ongoing  GOAL:   Patient will meet greater than or equal to 90% of their needs  Unmet  MONITOR:   I & O's, Weight trends, TF tolerance  REASON FOR ASSESSMENT:   Consult Enteral/tube feeding initiation and management  ASSESSMENT:   Pt with PMH of pre-diabetes, GERD and anemia, presented with a gastric outlet obstruction and gastric cancer. Pt had distal gastrectomy, cholecystectomy, placement of jejunostomy on 2/27  Spoke with RN, she states pt experienced N/V this weekend so they put the feeding on hold until today. They administered TF at 10 mL and Pro-Stat this morning around 9 AM and pt has tolerated thus far. Per RN report, pt has a soft abdomen and no distention currently. Per RN report pt has not had a bowel movement since 3/2.  Spoke with pt, he reports tolerating the TF reporting no N/V currently. Pt also reports no distended stomach and no feelings of satiety at this time. Pt reports a slow increase in energy. Pt does not like the ice chips he has been given stating "they taste like chlorine".  Diet Order:  Diet NPO time specified  Skin:  Reviewed, no issues  Last BM:  3/2  Height:   Ht Readings from Last 1 Encounters:  01/10/17 5\' 11"  (1.803 m)    Weight:   Wt Readings from Last 1 Encounters:  01/16/17 266 lb 15.6 oz (121.1 kg)    Ideal Body Weight:  78 kg  BMI:  Body mass index is 37.24 kg/m.  Estimated Nutritional Needs:    Kcal:  2400-2600  Protein:  140-160 grams  Fluid:  >/= 2 L/d  EDUCATION NEEDS:   Education needs addressed  Parks Ranger Dietetic Intern

## 2017-01-16 NOTE — Progress Notes (Signed)
  General Surgery [facility]  Assessment & Plan:  POD#6 - status post distal gastrectomy for carcinoma, cholecystectomy             NPO, NG decompression, IV hydration             Rx pain, nausea             Encouraged OOB, ambulation  Restart tube feeds while NGT in place.    Allow ice chips  Check drain for amylase Transfer to floor       Stark Klein, MD, [facility]       Office: [phone]    Subjective: Patient doing better.  No n/v with NGT in place.  1300 mL bilious output.    Objective: Vital signs in last 24 hours: Temp:  [98.1 F (36.7 C)-99.3 F (37.4 C)] 99.3 F (37.4 C) (03/05 1202) Pulse Rate:  [88-100] 100 (03/05 1202) Resp:  [13-19] 13 (03/05 1202) BP: (128-137)/(54-86) 135/70 (03/05 1202) SpO2:  [98 %-100 %] 98 % (03/05 1202) Weight:  [121.1 kg (266 lb 15.6 oz)] 121.1 kg (266 lb 15.6 oz) (03/05 0333)    Intake/Output from previous day: 03/04 0701 - 03/05 0700 In: 140 [NG/GT:140] Out: 2150 [Urine:800; Emesis/NG output:1350] Intake/Output this shift: Total I/O In: -  Out: 470 [Urine:450; Drains:20]  Physical Exam: HEENT - sclerae clear, mucous membranes moist Neck - soft Chest -breathing comfortably Abdomen - soft, obese; midline wound dry and intact; J-tube plugged; drain with murky output Neuro - alert & oriented, no focal deficits  Lab Results:   Recent Labs  01/14/17 0346 01/15/17 0222  WBC 7.2 6.2  HGB 8.1* 7.8*  HCT 25.9* 24.6*  PLT 216 227   BMET  Recent Labs  01/14/17 0346 01/15/17 0222  NA 138 136  K 4.0 3.7  CL 106 106  CO2 26 25  GLUCOSE 140* 132*  BUN 12 8  CREATININE 0.90 0.82  CALCIUM 8.2* 8.3*   PT/INR No results for input(s): LABPROT, INR in the last 72 hours. Comprehensive Metabolic Panel:    Component Value Date/Time   NA 136 01/15/2017 0222   NA 138 01/14/2017 0346   K 3.7 01/15/2017 0222   K 4.0 01/14/2017 0346   CL 106 01/15/2017 0222   CL  106 01/14/2017 0346   CO2 25 01/15/2017 0222   CO2 26 01/14/2017 0346   BUN 8 01/15/2017 0222   BUN 12 01/14/2017 0346   CREATININE 0.82 01/15/2017 0222   CREATININE 0.90 01/14/2017 0346   GLUCOSE 132 (H) 01/15/2017 0222   GLUCOSE 140 (H) 01/14/2017 0346   CALCIUM 8.3 (L) 01/15/2017 0222   CALCIUM 8.2 (L) 01/14/2017 0346   AST 24 01/15/2017 0222   AST 17 01/14/2017 0346   ALT 23 01/15/2017 0222   ALT 17 01/14/2017 0346   ALKPHOS 68 01/15/2017 0222   ALKPHOS 55 01/14/2017 0346   BILITOT 0.6 01/15/2017 0222   BILITOT 0.5 01/14/2017 0346   PROT 5.4 (L) 01/15/2017 0222   PROT 5.2 (L) 01/14/2017 0346   ALBUMIN 2.2 (L) 01/15/2017 0222   ALBUMIN 2.2 (L) 01/14/2017 0346    Studies/Results: No results found.    [place] 01/16/2017  Patient ID: [name], male   DOB: 16-Aug-1951, 66 y.o.   MRN: [id number]

## 2017-01-17 LAB — COMPREHENSIVE METABOLIC PANEL
ALT: 79 U/L — ABNORMAL HIGH (ref 17–63)
AST: 58 U/L — ABNORMAL HIGH (ref 15–41)
Albumin: 2.4 g/dL — ABNORMAL LOW (ref 3.5–5.0)
Alkaline Phosphatase: 95 U/L (ref 38–126)
Anion gap: 9 (ref 5–15)
BUN: 10 mg/dL (ref 6–20)
CO2: 24 mmol/L (ref 22–32)
Calcium: 8.7 mg/dL — ABNORMAL LOW (ref 8.9–10.3)
Chloride: 104 mmol/L (ref 101–111)
Creatinine, Ser: 0.94 mg/dL (ref 0.61–1.24)
GFR calc Af Amer: >60 mL/min (ref >60)
GFR calc non Af Amer: >60 mL/min (ref >60)
Glucose, Bld: 160 mg/dL — ABNORMAL HIGH (ref 65–99)
Potassium: 3.5 mmol/L (ref 3.5–5.1)
Sodium: 137 mmol/L (ref 135–145)
Total Bilirubin: 1.4 mg/dL — ABNORMAL HIGH (ref 0.3–1.2)
Total Protein: 6 g/dL — ABNORMAL LOW (ref 6.5–8.1)

## 2017-01-17 LAB — GLUCOSE, CAPILLARY
Glucose-Capillary: 144 mg/dL — ABNORMAL HIGH (ref 65–99)
Glucose-Capillary: 144 mg/dL — ABNORMAL HIGH (ref 65–99)
Glucose-Capillary: 157 mg/dL — ABNORMAL HIGH (ref 65–99)
Glucose-Capillary: 137 mg/dL — ABNORMAL HIGH (ref 65–99)
Glucose-Capillary: 136 mg/dL — ABNORMAL HIGH (ref 65–99)

## 2017-01-17 LAB — CBC
HCT: 26.3 % — ABNORMAL LOW (ref 39.0–52.0)
Hemoglobin: 8.3 g/dL — ABNORMAL LOW (ref 13.0–17.0)
MCH: 24.2 pg — ABNORMAL LOW (ref 26.0–34.0)
MCHC: 31.6 g/dL (ref 30.0–36.0)
MCV: 76.7 fL — ABNORMAL LOW (ref 78.0–100.0)
Platelets: 250 10*3/uL (ref 150–400)
RBC: 3.43 MIL/uL — ABNORMAL LOW (ref 4.22–5.81)
RDW: 15.1 % (ref 11.5–15.5)
WBC: 10.1 10*3/uL (ref 4.0–10.5)

## 2017-01-17 LAB — LIPASE, BLOOD: Lipase: 114 U/L — ABNORMAL HIGH (ref 11–51)

## 2017-01-17 MED ORDER — JEVITY 1.2 CAL PO LIQD
1000.0000 mL | ORAL | Status: DC
  Administered 2017-01-20: 04:00:00
  Filled 2017-01-17 (×4): qty 1000

## 2017-01-17 MED ORDER — PIPERACILLIN-TAZOBACTAM 3.375 G IVPB
3.3750 g | Freq: Three times a day (TID) | INTRAVENOUS | Status: DC
  Administered 2017-01-17 – 2017-01-24 (×23): 3.375 g via INTRAVENOUS
  Filled 2017-01-17 (×25): qty 50

## 2017-01-17 NOTE — Progress Notes (Signed)
Pharmacy Antibiotic Note  [name] is a 66 y.o. male admitted on 01/10/2017 with GOO from gastric cancer.  Pharmacy has been consulted for Zosyn dosing for pus coming out of JP tube.  Patient's renal function remains stable.  Plan: Zosyn 3.375gm IV Q8H, 4 hr infusion Pharmacy will sign off as dosage adjustment is likely unnecessary.  Thank you for the consult!   Height: 5\' 11"  (180.3 cm) Weight: 263 lb 14.3 oz (119.7 kg) IBW/kg (Calculated) : 75.3  Temp (24hrs), Avg:98.2 F (36.8 C), Min:97.4 F (36.3 C), Max:99.3 F (37.4 C)   Recent Labs Lab 01/12/17 0227 01/13/17 0422 01/14/17 0346 01/15/17 0222 01/17/17 0732  WBC 12.2* 13.0* 7.2 6.2 10.1  CREATININE 0.98 1.06 0.90 0.82 0.94    Estimated Creatinine Clearance: 101.8 mL/min (by C-G formula based on SCr of 0.94 mg/dL).    Allergies  Allergen Reactions  . No Known Allergies     Zosyn 3/6 >>  2/27 MRSA PCR - negative   Nikkia Devoss D. Mina Marble, PharmD, BCPS Pager:  [phone] 01/17/2017, 9:55 AM

## 2017-01-17 NOTE — Progress Notes (Signed)
Physical Therapy Treatment Patient Details Name: [name] MRN: [id number] DOB: [birth year] Today's Date: 01/17/2017    History of Present Illness Pt is a 66 y/o male s/p distal gastrectomy and cholescystectomy secondary to gastric cancer. No pertinent PMH.    PT Comments    Pt admitted with above diagnosis. Pt currently with functional limitations due to the deficits listed below (see PT Problem List). Pt was able  To ambulate in hallway.  Did well overall.  No LOB with RW with good safety.  Progressing well.   Pt will benefit from skilled PT to increase their independence and safety with mobility to allow discharge to the venue listed below.     Follow Up Recommendations  No PT follow up;Supervision - Intermittent     Equipment Recommendations  None recommended by PT    Recommendations for Other Services       Precautions / Restrictions Precautions Precautions: Fall Precaution Comments: abdominal drain, NG tube to wall suction Restrictions Weight Bearing Restrictions: No    Mobility  Bed Mobility Overal bed mobility: Needs Assistance Bed Mobility: Sit to Supine       Sit to supine: Min assist   General bed mobility comments: increased time, min A to move bilateral UEs on to bed  Transfers Overall transfer level: Needs assistance Equipment used: Rolling walker (2 wheeled) Transfers: Sit to/from [facility] Sit to Stand: Min guard Stand pivot transfers: Min guard       General transfer comment: increased time, good technique, min guard for safety and line management  Ambulation/Gait Ambulation/Gait assistance: Min guard Ambulation Distance (Feet): 350 Feet Assistive device: Rolling walker (2 wheeled) Gait Pattern/deviations: Step-through pattern Gait velocity: decreased Gait velocity interpretation: Below normal speed for age/gender General Gait Details: Pt ambulated entire hallway with good safety with RW.  Pt wanted to bathe upon return  to room therefore went into bathroom and stood at sink and did full bath standing at sink with min guard assist with good balance.    Stairs            Wheelchair Mobility    Modified Rankin (Stroke Patients Only)       Balance Overall balance assessment: Needs assistance Sitting-balance support: Feet supported;No upper extremity supported Sitting balance-Leahy Scale: Good     Standing balance support: During functional activity;Bilateral upper extremity supported Standing balance-Leahy Scale: Fair Standing balance comment: can stand statically without assist at sink as he stood 15 min to bathe.No LOB                    Cognition Arousal/Alertness: Awake/alert Behavior During Therapy: WFL for tasks assessed/performed Overall Cognitive Status: Within Functional Limits for tasks assessed                      Exercises      General Comments        Pertinent Vitals/Pain Pain Assessment: 0-10 Pain Score: 6  Pain Location: abdomen Pain Descriptors / Indicators: Sore Pain Intervention(s): Limited activity within patient's tolerance;Monitored during session;Premedicated before session;Repositioned    Home Living                      Prior Function            PT Goals (current goals can now be found in the care plan section) Progress towards PT goals: Progressing toward goals    Frequency    Min 3X/week  PT Plan Current plan remains appropriate    Co-evaluation             End of Session Equipment Utilized During Treatment: Gait belt Activity Tolerance: Patient tolerated treatment well Patient left: in bed;with call bell/phone within reach;with family/visitor present Nurse Communication: Mobility status;Other (comment) (NG tube disconnected from walk suction during walk) PT Visit Diagnosis: Other abnormalities of gait and mobility (R26.89);Pain Pain - part of body:  (abdomen and throat)  Notified nurse that NG tube  needs to be retaped as it was loose    Time: [id number] PT Time Calculation (min) (ACUTE ONLY): 46 min  Charges:  $Gait Training: 23-37 mins $Self Care/Home Management: 8-22                    G Codes:       [place] February 06, 2017, 11:53 AM [facility] [phone] (pager)

## 2017-01-17 NOTE — Discharge Instructions (Addendum)
[facility]  [address], [place], [place] [address]                         Phone: [phone]   Care of a Feeding Tube People who have trouble swallowing or cannot take food or medicine by mouth are sometimes given feeding tubes. A feeding tube can go into the nose and down to the stomach or through the skin in the abdomen and into the stomach or small bowel. Some of the names of these feeding tubes are gastrostomy tubes, PEG lines, nasogastric tubes, and gastrojejunostomy tubes. Supplies needed to care for the tube site:  Clean gloves.  Clean wash cloth, gauze pads, or soft paper towel.  Cotton swabs.  Skin barrier ointment or cream.  Soap and water.  Pre-cut foam pads or gauze (that go around the tube).  Tube tape. Tube site care 1. Have all supplies ready and available. 2. Wash hands well. 3. Put on clean gloves. 4. Remove the soiled foam pad or gauze, if present, that is found under the tube stabilizer. Change the foam pad or gauze daily or when soiled or moist. 5. Check the skin around the tube site for redness, rash, swelling, drainage, or extra tissue growth. If you notice any of these, call your caregiver. 6. Moisten gauze and cotton swabs with water and soap. 7. Wipe the area closest to the tube (right near the stoma) with cotton swabs. Wipe the surrounding skin with moistened gauze. Rinse with water. 8. Dry the skin and stoma site with a dry gauze pad or soft paper towel. Do not use antibiotic ointments at the tube site. 9. If the skin is red, apply a skin barrier cream or ointment (such as petroleum jelly) in a circular motion, using a cotton swab. The cream or ointment will provide a moisture barrier for the skin and helps with wound healing. 10. Apply a new pre-cut foam pad or gauze around the tube. Secure it with tape around the edges. If no drainage is present, foam pads or gauze may be left off. 11. Use tape or an anchoring device to fasten  the feeding tube to the skin for comfort or as directed. Rotate where you tape the tube to avoid skin damage from the adhesive. 12. Position the person in a semi-upright position (30-45 degree angle). 13. Throw away used supplies. 14. Remove gloves. 15. Wash hands. Supplies needed to flush a feeding tube:  Clean gloves.  60 mL syringe (that connects to the feeding tube).  Towel.  Water. Flushing a feeding tube 1. Have all supplies ready and available. 2. Wash hands well. 3. Put on clean gloves. 4. Draw up 30 mL of water in the syringe. 5. Kink the feeding tube while disconnecting it from the feeding-bag tubing or while removing the plug at the end of the tube. Kinking closes the tube and prevents secretions in the tube from spilling out. 6. Insert the tip of the syringe into the end of the feeding tube. Release the kink. Slowly inject the water. 7. If unable to inject the water, the person with the feeding tube should lay on his or her left side. The tip of the tube may be against the stomach wall, blocking fluid flow. Changing positions may move the tip away from the stomach wall. After repositioning, try injecting the water again.  Do not use a syringe smaller than 60 mL to flush the tubing.  Do  not use excessive force to overcome resistance because this could cause the tube to rupture. 8. After injecting the water, remove the syringe. 9. Always flush before giving the first medicine, between medicines, and after the final medicine before starting a feeding. This prevents medicines from clogging the tube.  Do not mix medicines with formula or with other medicines before giving medicines.  Thoroughly flush medicines through the tube so they do not mix with formula. 10. Throw away used supplies. 11. Remove gloves. 12. Wash hands. This information is not intended to replace advice given to you by your health care provider. Make sure you discuss any questions you have with your  health care provider. Document Released: 10/31/2005 Document Revised: 04/13/2016 Document Reviewed: 06/14/2012 Elsevier Interactive Patient Education  2017 [facility], [place] [phone]  ABDOMINAL SURGERY: POST OP INSTRUCTIONS  Always review your discharge instruction sheet given to you by the facility where your surgery was performed.  IF YOU HAVE DISABILITY OR FAMILY LEAVE FORMS, YOU MUST BRING THEM TO THE OFFICE FOR PROCESSING.  PLEASE DO NOT GIVE THEM TO YOUR DOCTOR.  1. A prescription for pain medication may be given to you upon discharge.  Take your pain medication as prescribed, if needed.  If narcotic pain medicine is not needed, then you may take acetaminophen (Tylenol) or ibuprofen (Advil) as needed. 2. Take your usually prescribed medications unless otherwise directed. 3. If you need a refill on your pain medication, please contact your pharmacy. They will contact our office to request authorization.  Prescriptions will not be filled after 5pm or on week-ends. 4. You should follow a light diet the first few days after arrival home, such as soup and crackers, pudding, etc.unless your doctor has advised otherwise. A high-fiber, low fat diet can be resumed as tolerated.   Be sure to include lots of fluids daily. Most patients will experience some swelling and bruising on the chest and neck area.  Ice packs will help.  Swelling and bruising can take several days to resolve 5. Most patients will experience some swelling and bruising in the area of the incision. Ice pack will help. Swelling and bruising can take several days to resolve..  6. It is common to experience some constipation if taking pain medication after surgery.  Increasing fluid intake and taking a stool softener will usually help or prevent this problem from occurring.  A mild laxative (Milk of Magnesia or Miralax) should be taken according to package directions if there are no bowel  movements after 48 hours. 7.  You may have steri-strips (small skin tapes) in place directly over the incision.  These strips should be left on the skin for 10-14 days.  If your surgeon used skin glue on the incision, you may shower in 48 hours.  The glue will flake off over the next 2-3 weeks.  Any sutures or staples will be removed at the office during your follow-up visit. You may find that a light gauze bandage over your incision may keep your staples from being rubbed or pulled. You may shower and replace the bandage daily. 8. ACTIVITIES:  You may resume regular (light) daily activities beginning the next day--such as daily self-care, walking, climbing stairs--gradually increasing activities as tolerated.  You may have sexual intercourse when it is comfortable.  Refrain from any heavy lifting or straining until approved by your doctor. a. You may drive when you no longer are taking prescription pain  medication, you can comfortably wear a seatbelt, and you can safely maneuver your car and apply brakes b. Return to Work: __________8 weeks if applicable_________________________ 74. You should see your doctor in the office for a follow-up appointment approximately two weeks after your surgery.  Make sure that you call for this appointment within a day or two after you arrive home to insure a convenient appointment time. OTHER INSTRUCTIONS:  _____________________________________________________________ _____________________________________________________________  WHEN TO CALL YOUR DOCTOR: 1. Fever over 101.0 2. Inability to urinate 3. Nausea and/or vomiting 4. Extreme swelling or bruising 5. Continued bleeding from incision. 6. Increased pain, redness, or drainage from the incision. 7. Difficulty swallowing or breathing 8. Muscle cramping or spasms. 9. Numbness or tingling in hands or feet or around lips.  The clinic staff is available to answer your questions during regular business hours.   Please dont hesitate to call and ask to speak to one of the nurses if you have concerns.  For further questions, please visit [email or web address]

## 2017-01-17 NOTE — Care Management Note (Addendum)
Case Management Note  Patient Details  Name: [name] MRN: [id number] Date of Birth: 17-Oct-1951  Subjective/Objective:                    Action/Plan: Spoke with patient and his wife  [facility] cell [place], [place], [place]  [facility] will deliver tube feeding to hospital room before discharge. After initial delivery supplies will be shipped to [place] address.  Left Mary with [facility] message regarding providing [facility], awaiting call back. Mary returned call , unfortunately [facility] cannot accept referral . List provided to patient and wife Butch Penny. They would like [facility]. Call placed to [facility] with [place] awaiting call back.   [facility] has accepted referral.   Expected Discharge Date:                  Expected Discharge Plan:  [place]  In-House Referral:     Discharge planning Services  CM Consult  Post Acute Care Choice:   [facility]  Choice offered to:  Patient, Spouse  DME Arranged:  Tube feeding pump, Tube feeding DME Agency:  [place]:  RN [facility] Agency: [place]  Status of Service:  In process, will continue to follow  If discussed at Long Length of Stay Meetings, dates discussed:    Additional Comments:  Marilu Favre, RN 01/17/2017, 1:09 PM

## 2017-01-17 NOTE — Progress Notes (Signed)
  General Surgery [facility]  Assessment & Plan:  POD#7 -   status post distal gastrectomy for carcinoma, cholecystectomy.  Margins negative, many LN positive.               NPO, NG decompression, IV hydration             Rx pain, nausea             Encouraged OOB, ambulation  Advance tube feeds to goal while NGT in place and now that bowel function has returned.    Allow ice chips  Check drain for lipase        Stark Klein, MD, [facility]       Office: [phone]    Subjective: Pain OK.  C/o itching and being hot from gown.    Objective: Vital signs in last 24 hours: Temp:  [97.4 F (36.3 C)-99.3 F (37.4 C)] 97.4 F (36.3 C) (03/06 0408) Pulse Rate:  [89-112] 89 (03/06 0408) Resp:  [13-19] 19 (03/06 0408) BP: (113-135)/(64-70) 122/68 (03/06 0408) SpO2:  [96 %-99 %] 96 % (03/06 0839) Weight:  [119.7 kg (263 lb 14.3 oz)-122.3 kg (269 lb 10 oz)] 119.7 kg (263 lb 14.3 oz) (03/06 0407)    Intake/Output from previous day: 03/05 0701 - 03/06 0700 In: 4980.6 [I.V.:4850.6; NG/GT:130] Out: 2325 [Urine:1380; Emesis/NG output:900; Drains:45] Intake/Output this shift: No intake/output data recorded.  Physical Exam: HEENT - sclerae clear, mucous membranes moist Neck - soft Chest -breathing comfortably Abdomen - soft, obese; midline wound dry and intact; J-tube plugged; drain with murky output and pus around exit site. Neuro - alert & oriented, no focal deficits  Lab Results:   Recent Labs  01/15/17 0222 01/17/17 0732  WBC 6.2 10.1  HGB 7.8* 8.3*  HCT 24.6* 26.3*  PLT 227 250   BMET  Recent Labs  01/15/17 0222 01/17/17 0732  NA 136 137  K 3.7 3.5  CL 106 104  CO2 25 24  GLUCOSE 132* 160*  BUN 8 10  CREATININE 0.82 0.94  CALCIUM 8.3* 8.7*   PT/INR No results for input(s): LABPROT, INR in the last 72 hours. Comprehensive Metabolic Panel:    Component Value Date/Time   NA 137 01/17/2017 0732   NA  136 01/15/2017 0222   K 3.5 01/17/2017 0732   K 3.7 01/15/2017 0222   CL 104 01/17/2017 0732   CL 106 01/15/2017 0222   CO2 24 01/17/2017 0732   CO2 25 01/15/2017 0222   BUN 10 01/17/2017 0732   BUN 8 01/15/2017 0222   CREATININE 0.94 01/17/2017 0732   CREATININE 0.82 01/15/2017 0222   GLUCOSE 160 (H) 01/17/2017 0732   GLUCOSE 132 (H) 01/15/2017 0222   CALCIUM 8.7 (L) 01/17/2017 0732   CALCIUM 8.3 (L) 01/15/2017 0222   AST 58 (H) 01/17/2017 0732   AST 24 01/15/2017 0222   ALT 79 (H) 01/17/2017 0732   ALT 23 01/15/2017 0222   ALKPHOS 95 01/17/2017 0732   ALKPHOS 68 01/15/2017 0222   BILITOT 1.4 (H) 01/17/2017 0732   BILITOT 0.6 01/15/2017 0222   PROT 6.0 (L) 01/17/2017 0732   PROT 5.4 (L) 01/15/2017 0222   ALBUMIN 2.4 (L) 01/17/2017 0732   ALBUMIN 2.2 (L) 01/15/2017 0222    Studies/Results: No results found.    [place] 01/17/2017  Patient ID: [name], male   DOB: 12/04/1950, 66 y.o.   MRN: [id number]

## 2017-01-18 ENCOUNTER — Telehealth: Payer: Self-pay | Admitting: *Deleted

## 2017-01-18 ENCOUNTER — Other Ambulatory Visit: Payer: Self-pay | Admitting: *Deleted

## 2017-01-18 DIAGNOSIS — C169 Malignant neoplasm of stomach, unspecified: Secondary | ICD-10-CM

## 2017-01-18 LAB — BASIC METABOLIC PANEL
Anion gap: 9 (ref 5–15)
BUN: 14 mg/dL (ref 6–20)
CO2: 24 mmol/L (ref 22–32)
Calcium: 8.6 mg/dL — ABNORMAL LOW (ref 8.9–10.3)
Chloride: 102 mmol/L (ref 101–111)
Creatinine, Ser: 1.06 mg/dL (ref 0.61–1.24)
GFR calc Af Amer: >60 mL/min (ref >60)
GFR calc non Af Amer: >60 mL/min (ref >60)
Glucose, Bld: 204 mg/dL — ABNORMAL HIGH (ref 65–99)
Potassium: 4 mmol/L (ref 3.5–5.1)
Sodium: 135 mmol/L (ref 135–145)

## 2017-01-18 LAB — CBC
HCT: 27.8 % — ABNORMAL LOW (ref 39.0–52.0)
Hemoglobin: 8.8 g/dL — ABNORMAL LOW (ref 13.0–17.0)
MCH: 24.5 pg — ABNORMAL LOW (ref 26.0–34.0)
MCHC: 31.7 g/dL (ref 30.0–36.0)
MCV: 77.4 fL — ABNORMAL LOW (ref 78.0–100.0)
Platelets: 290 10*3/uL (ref 150–400)
RBC: 3.59 MIL/uL — ABNORMAL LOW (ref 4.22–5.81)
RDW: 15.5 % (ref 11.5–15.5)
WBC: 13.9 10*3/uL — ABNORMAL HIGH (ref 4.0–10.5)

## 2017-01-18 LAB — GLUCOSE, CAPILLARY
Glucose-Capillary: 162 mg/dL — ABNORMAL HIGH (ref 65–99)
Glucose-Capillary: 163 mg/dL — ABNORMAL HIGH (ref 65–99)
Glucose-Capillary: 164 mg/dL — ABNORMAL HIGH (ref 65–99)
Glucose-Capillary: 181 mg/dL — ABNORMAL HIGH (ref 65–99)
Glucose-Capillary: 182 mg/dL — ABNORMAL HIGH (ref 65–99)
Glucose-Capillary: 220 mg/dL — ABNORMAL HIGH (ref 65–99)

## 2017-01-18 NOTE — Telephone Encounter (Signed)
Per staff message, RN called and requested pathology testing.

## 2017-01-18 NOTE — Progress Notes (Signed)
General Surgery [facility]  Assessment & Plan:  POD#8 -   status post distal gastrectomy for carcinoma, cholecystectomy.  Margins negative, many LN positive.               NPO, NG decompression, IV hydration  Hold tube feeds at 30 ml/hr since having hiccups again and some nausea.                           Encouraged OOB, ambulation.  Doing well with PT.  Allow ice chips  Check drain for amyase- pending  Zosyn for purulent drain output.       Stark Klein, MD, [facility]       Office: [phone]    Subjective: Soreness improved.  Recurrent hiccups.      Objective: Vital signs in last 24 hours: Temp:  [97.7 F (36.5 C)-100.1 F (37.8 C)] 100.1 F (37.8 C) (03/07 1423) Pulse Rate:  [100-119] 100 (03/07 1423) Resp:  [16-21] 20 (03/07 1423) BP: (116-138)/(64-83) 126/83 (03/07 1423) SpO2:  [97 %-100 %] 100 % (03/07 1423) Weight:  [119.8 kg (264 lb 1.8 oz)] 119.8 kg (264 lb 1.8 oz) (03/07 0522) Last BM Date: 01/17/17  Intake/Output from previous day: 03/06 0701 - 03/07 0700 In: 500 [I.V.:500] Out: 1658 [Urine:550; Emesis/NG output:1100; Drains:8] Intake/Output this shift: Total I/O In: 473.5 [NG/GT:473.5] Out: 405 [Emesis/NG output:400; Drains:5]  Physical Exam: HEENT - sclerae clear, mucous membranes moist Neck - soft Chest -breathing comfortably Abdomen - soft, obese; midline wound dry and intact; j tube in place.  Still small amt purulent drainage in tube.   Neuro - alert & oriented, no focal deficits  Lab Results:   Recent Labs  01/17/17 0732 01/18/17 0506  WBC 10.1 13.9*  HGB 8.3* 8.8*  HCT 26.3* 27.8*  PLT 250 290   BMET  Recent Labs  01/17/17 0732 01/18/17 0506  NA 137 135  K 3.5 4.0  CL 104 102  CO2 24 24  GLUCOSE 160* 204*  BUN 10 14  CREATININE 0.94 1.06  CALCIUM 8.7* 8.6*   PT/INR No results for input(s): LABPROT, INR in the last 72 hours. Comprehensive Metabolic Panel:     Component Value Date/Time   NA 135 01/18/2017 0506   NA 137 01/17/2017 0732   K 4.0 01/18/2017 0506   K 3.5 01/17/2017 0732   CL 102 01/18/2017 0506   CL 104 01/17/2017 0732   CO2 24 01/18/2017 0506   CO2 24 01/17/2017 0732   BUN 14 01/18/2017 0506   BUN 10 01/17/2017 0732   CREATININE 1.06 01/18/2017 0506   CREATININE 0.94 01/17/2017 0732   GLUCOSE 204 (H) 01/18/2017 0506   GLUCOSE 160 (H) 01/17/2017 0732   CALCIUM 8.6 (L) 01/18/2017 0506   CALCIUM 8.7 (L) 01/17/2017 0732   AST 58 (H) 01/17/2017 0732   AST 24 01/15/2017 0222   ALT 79 (H) 01/17/2017 0732   ALT 23 01/15/2017 0222   ALKPHOS 95 01/17/2017 0732   ALKPHOS 68 01/15/2017 0222   BILITOT 1.4 (H) 01/17/2017 0732   BILITOT 0.6 01/15/2017 0222   PROT 6.0 (L) 01/17/2017 0732   PROT 5.4 (L) 01/15/2017 0222   ALBUMIN 2.4 (L) 01/17/2017 0732   ALBUMIN 2.2 (L) 01/15/2017 0222    Studies/Results: No results found.    [name] 01/18/2017  Patient ID: [name], male   DOB: 1951-02-01, 66 y.o.  MRN: [id number]

## 2017-01-18 NOTE — Consult Note (Signed)
...    HEMATOLOGY/ONCOLOGY CONSULTATION NOTE  Date of Service: 01/19/2017 Patient Care Team: [name], MD as PCP - General (Internal Medicine)  CHIEF COMPLAINTS/PURPOSE OF CONSULTATION:  Newly diagnosed pT4a, pN3b, pM1 Gastric Carcinoma  HISTORY OF PRESENTING ILLNESS:   [name] is a wonderful 66 y.o. male who has been referred to [name] by Dr Stark Klein for evaluation and management of Newly diagnosed pT4a, pN3b, pM1 Gastric Adenocarcinoma.  Patient was previously seen in clinic on 01/09/2017 with a diagnosis of gastric adenocarcinoma in the gastric pylorus causing significant intrinsic stenosis . Patient had presented to primary care physician with dyspeptic symptoms, partial gastric outlet obstruction and weight loss of 60 pounds. Patient has had a distal gastrectomy with jejunal feeding tube placement by Dr. Barry Dienes on 01/10/2017 and is currently recovering postoperatively. Still has an NG tube in place. Is getting tube feeding through his jejunal feeding tube. Notes that he is having good bowel movements.   He notes that he is starting to ambulate independently.  Pathology showed  " INVASIVE MODERATELY TO POORLY DIFFERENTIATED ADENOCARCINOMA, SPANNING 4 CM IN GREATEST DIMENSION. TUMOR INVADES THROUGH STOMACH WALL TO INVOLVE SEROSAL SURFACE.  LYMPH/VASCULAR INVASION IS IDENTIFIED.  MARGINS ARE NEGATIVE.- FIFTEEN OF THIRTY NINE LYMPH NODES POSITIVE FOR METASTATIC ADENOCARCINOMA (15/39). EXTRACAPSULAR EXTENSION IS IDENTIFIED. 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  I discussed the pathology results with the patient and answered his questions.  ECHO was done today since anthracycline chemotherapy might need to be considered. This showe dnl EF of 60-65%.   MEDICAL HISTORY:  Past Medical History:  Diagnosis Date   Anemia    GERD (gastroesophageal reflux disease)    Pre-diabetes      SURGICAL HISTORY: Past Surgical History:  Procedure Laterality Date   CHOLECYSTECTOMY N/A 01/10/2017   Procedure: CHOLECYSTECTOMY;  Surgeon: Stark Klein, MD;  Location: [facility];  Service: General;  Laterality: N/A;   COLONOSCOPY     COLONOSCOPY  10/11/2011   Procedure: COLONOSCOPY;  Surgeon: Daneil Dolin, MD;  [facility];  Service: Endoscopy;  Laterality: N/A;  10:30 AM   ESOPHAGOGASTRODUODENOSCOPY N/A 12/30/2016   Procedure: ESOPHAGOGASTRODUODENOSCOPY (EGD);  Surgeon: Carol Ada, MD;  Location: [facility];  Service: Endoscopy;  Laterality: N/A;   GASTRECTOMY N/A 01/10/2017   Procedure: DISTAL GASTRECTOMY, JEJUNUM  FEEDING TUBE PLACEMENT;  Surgeon: Stark Klein, MD;  Location: [facility];  Service: General;  Laterality: N/A;   testicle removed  1982    SOCIAL HISTORY: Social History   Social History   Marital status: Married    Spouse name: N/A   Number of children: N/A   Years of education: N/A   Occupational History   Not on file.   Social History Main Topics   Smoking status: Never Smoker   Smokeless tobacco: Never Used   Alcohol use 2.4 oz/week    2 Glasses of wine, 2 Cans of beer per week   Drug use: No   Sexual activity: Not on file   Other Topics Concern   Not on file   Social History Narrative   No narrative on file    FAMILY HISTORY: Family History  Problem Relation Age of Onset   Colon cancer Neg Hx     ALLERGIES:  is allergic to no known allergies.  MEDICATIONS:  Current Facility-Administered Medications  Medication Dose Route Frequency Provider Last Rate Last Dose   chlorhexidine (PERIDEX) 0.12 % solution 15 mL  15  mL Mouth Rinse BID Stark Klein, MD   15 mL at 01/20/17 0950   chlorproMAZINE (THORAZINE) 25 mg in sodium chloride 0.9 % 25 mL IVPB  25 mg Intravenous Q6H PRN Stark Klein, MD   25 mg at 01/20/17 0218   [START ON 01/21/2017] dextrose 5 % and 0.45 % NaCl with KCl 20 mEq/L infusion   Intravenous Continuous  Stark Klein, MD       diphenhydrAMINE (BENADRYL) injection 12.5 mg  12.5 mg Intravenous Q4H PRN Roberts Gaudy, MD       Or   diphenhydrAMINE (BENADRYL) capsule 25 mg  25 mg Oral Q4H PRN Roberts Gaudy, MD       feeding supplement (JEVITY 1.2 CAL) liquid 1,000 mL  1,000 mL Per Tube Continuous Stark Klein, MD 50 mL/hr at 01/20/17 1000 1,000 mL at 01/20/17 1000   feeding supplement (PRO-STAT SUGAR FREE 64) liquid 60 mL  60 mL Per Tube BID Stark Klein, MD   60 mL at 01/20/17 0950   hydrALAZINE (APRESOLINE) injection 10 mg  10 mg Intravenous Q2H PRN Stark Klein, MD       HYDROmorphone (DILAUDID) injection 0.5-2 mg  0.5-2 mg Intravenous Q2H PRN Stark Klein, MD   2 mg at 01/20/17 0404   LORazepam (ATIVAN) injection 1 mg  1 mg Intravenous QHS PRN Armandina Gemma, MD   1 mg at 01/19/17 2237   MEDLINE mouth rinse  15 mL Mouth Rinse q12n4p Stark Klein, MD   15 mL at 01/19/17 1112   nalbuphine (NUBAIN) injection 5 mg  5 mg Intravenous Q4H PRN Roberts Gaudy, MD       Or   nalbuphine (NUBAIN) injection 5 mg  5 mg Subcutaneous Q4H PRN Roberts Gaudy, MD       naloxone [facility]) 2 mg in dextrose 5 % 250 mL infusion  1-4 mcg/kg/hr Intravenous Continuous PRN Roberts Gaudy, MD       ondansetron [facility]) injection 4 mg  4 mg Intravenous Q8H PRN Roberts Gaudy, MD       ondansetron (ZOFRAN-ODT) disintegrating tablet 4 mg  4 mg Oral Q6H PRN Stark Klein, MD       Or   ondansetron (ZOFRAN) injection 4 mg  4 mg Intravenous Q6H PRN Stark Klein, MD   4 mg at 01/13/17 0917   pantoprazole (PROTONIX) injection 40 mg  40 mg Intravenous QHS Stark Klein, MD   40 mg at 01/19/17 2237   phenol (CHLORASEPTIC) mouth spray 1 spray  1 spray Mouth/Throat PRN Stark Klein, MD       piperacillin-tazobactam (ZOSYN) IVPB 3.375 g  3.375 g Intravenous Q8H Thuy D Dang, RPH   3.375 g at 01/20/17 1225   scopolamine (TRANSDERM-SCOP) 1 MG/3DAYS 1.5 mg  1 patch Transdermal Once Roberts Gaudy, MD   1.5 mg at 01/18/17 1058   sodium  chloride 0.9 % 1,000 mL with potassium chloride 80 mEq infusion   Intravenous Once Stark Klein, MD       sodium chloride flush (NS) 0.9 % injection 3 mL  3 mL Intravenous PRN Roberts Gaudy, MD        REVIEW OF SYSTEMS:    10 Point review of Systems was done is negative except as noted above.  PHYSICAL EXAMINATION: ECOG PERFORMANCE STATUS: 1 - Symptomatic but completely ambulatory  . Vitals:   01/18/17 1013 01/18/17 1200  BP: 127/64   Pulse: (!) 108   Resp: 19 19  Temp: 98.9 F (37.2 C)    Filed Weights  01/16/17 1628 01/17/17 0407 01/18/17 0522  Weight: 269 lb 10 oz (122.3 kg) 263 lb 14.3 oz (119.7 kg) 264 lb 1.8 oz (119.8 kg)   .Body mass index is 36.84 kg/m.  GENERAL:alert, in no acute distress and comfortable SKIN: skin color, texture, turgor are normal, no rashes or significant lesions EYES: normal, conjunctiva are pink and non-injected, sclera clear OROPHARYNX:NGT in situ  NECK: supple, no JVD LYMPH:  no palpable lymphadenopathy in the cervical, axillary or inguinal LUNGS: clear to auscultation with normal respiratory effort HEART: regular rate & rhythm,  no murmurs and trace lower extremity edema ABDOMEN: abdomen soft, non-tender, normoactive bowel sounds  Musculoskeletal: no cyanosis of digits and no clubbing  PSYCH: alert & oriented x 3 with fluent speech NEURO: no focal motor/sensory deficits  LABORATORY DATA:  I have reviewed the data as listed  . CBC Latest Ref Rng & Units 01/18/2017 01/17/2017 01/15/2017  WBC 4.0 - 10.5 K/uL 13.9(H) 10.1 6.2  Hemoglobin 13.0 - 17.0 g/dL 8.8(L) 8.3(L) 7.8(L)  Hematocrit 39.0 - 52.0 % 27.8(L) 26.3(L) 24.6(L)  Platelets 150 - 400 K/uL 290 250 227    . CMP Latest Ref Rng & Units 01/18/2017 01/17/2017 01/15/2017  Glucose 65 - 99 mg/dL 204(H) 160(H) 132(H)  BUN 6 - 20 mg/dL 14 10 8   Creatinine 0.61 - 1.24 mg/dL 1.06 0.94 0.82  Sodium 135 - 145 mmol/L 135 137 136  Potassium 3.5 - 5.1 mmol/L 4.0 3.5 3.7  Chloride 101 - 111 mmol/L  102 104 106  CO2 22 - 32 mmol/L 24 24 25   Calcium 8.9 - 10.3 mg/dL 8.6(L) 8.7(L) 8.3(L)  Total Protein 6.5 - 8.1 g/dL - 6.0(L) 5.4(L)  Total Bilirubin 0.3 - 1.2 mg/dL - 1.4(H) 0.6  Alkaline Phos 38 - 126 U/L - 95 68  AST 15 - 41 U/L - 58(H) 24  ALT 17 - 63 U/L - 79(H) 23   Patient: [name] Collected: 01/10/2017 Client: [facility] Accession: [id number] Received: 01/10/2017 Stark Klein DOB: 1951/05/26 Age: 88 Gender: M Reported: 01/12/2017 [address] Patient Ph: [phone] MRN #: [id number] [place], [place] [address] Visit #: [id number].[id number] Chart #: Phone:  Fax: CC: Sullivan Lone, MD [name], MD Carol Ada REPORT OF SURGICAL PATHOLOGY FINAL DIAGNOSIS Diagnosis 1. Stomach, resection for tumor, distal/antrum - INVASIVE MODERATELY TO POORLY DIFFERENTIATED ADENOCARCINOMA, SPANNING 4 CM IN GREATEST DIMENSION. - TUMOR INVADES THROUGH STOMACH WALL TO INVOLVE SEROSAL SURFACE. - LYMPH/VASCULAR INVASION IS IDENTIFIED. - MARGINS ARE NEGATIVE. - FIFTEEN OF THIRTY NINE LYMPH NODES POSITIVE FOR METASTATIC ADENOCARCINOMA (15/39). - EXTRACAPSULAR EXTENSION IS IDENTIFIED. 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 - SEE ONCOLOGY TEMPLATE. 2. Soft tissue, biopsy, Lesser,Sac nodule - ONE LYMPH NODE POSITIVE FOR METASTATIC ADENOCARCINOMA (1/1). - AN ADDITIONAL BENIGN LYMPH NODE WITH NO TUMOR SEEN (0/1). 3. Gallbladder - MILD CHRONIC CHOLECYSTITIS. - CHOLELITHIASIS. - NO TUMOR SEEN. Microscopic Comment 1. STOMACH: Specimen: Distal stomach and gallbladder. Procedure: Distal gastrectomy and cholecystectomy. Tumor Site: Distal stomach. Tumor Size: 4 cm. Histologic Type: Adenocarcinoma. Histologic Grade: Intermediate to high grade (G2 to G3: Moderately to poorly differentiated). Microscopic Extent of Tumor: Tumor invades through the entire stomach wall to involve serosal  surface. Margins (select all that apply) All margins uninvolved by carcinoma. All margins uninvolved by carcinoma: Distance of carcinoma from closest margin: 0.5 cm. Specify margin: Distal margin. 1 of 3 FINAL for [facility] [place] [phone]) Microscopic Comment(continued) Proximal Margin: Negative. Distal Margin: Negative. Omental (Radial)  Margins: Negative. Treatment Effect: Not applicable. Lymph-Vascular Invasion: Yes, present. Perineural Invasion: Not identified. Additional findings: No. Ancillary testing: Can be performed upon request. Lymph nodes: number examined 41; number positive: 16. Pathologic Staging: pT4a, pN3b, pM1. Comments: As there is a fragment of pancreatic tissue within the stomach resection containing metastatic tumor underneath its capsule, this is consistent with pM1 stage disease. (RH:kh 01-12-17) Willeen Niece MD Pathologist, Electronic Signature (Case signed 01/12/2017)   RADIOGRAPHIC STUDIES: I have personally reviewed the radiological images as listed and agreed with the findings in the report. Ct Abdomen Pelvis W Contrast  Result Date: 12/26/2016 CLINICAL DATA:  66 year old male with epigastric abdominal pain and abnormal weight loss. Reported incomplete colonoscopy earlier today. Reported upper endoscopy 11/25/2016. EXAM: CT ABDOMEN AND PELVIS WITH CONTRAST TECHNIQUE: Multidetector CT imaging of the abdomen and pelvis was performed using the standard protocol following bolus administration of intravenous contrast. CONTRAST:  11m ISOVUE-300 IOPAMIDOL (ISOVUE-300) INJECTION 61% COMPARISON:  None. FINDINGS: Lower chest: No significant pulmonary nodules or acute consolidative airspace disease. Coronary atherosclerosis. Hepatobiliary: Normal liver with no liver mass. Normal gallbladder with no radiopaque cholelithiasis. No biliary ductal dilatation. Pancreas: Normal, with no mass or duct dilation. Spleen: Normal size. No mass. Adrenals/Urinary Tract: Normal  adrenals. No hydronephrosis. Simple appearing 1.0 cm renal cyst in the anterior interpolar right kidney. Normal bladder. Stomach/Bowel: There is prominent distention of the stomach with food debris. There is mild annular wall thickening in the gastric antrum (series 3/ image 39). Normal caliber small bowel with no small bowel wall thickening. Normal appendix. Minimal sigmoid diverticulosis. Fluid levels throughout the nondistended large bowel. No large bowel wall thickening or pericolonic fat stranding. Vascular/Lymphatic: Normal caliber abdominal aorta. Patent portal, splenic, hepatic and renal veins. No pathologically enlarged lymph nodes in the abdomen or pelvis. Reproductive: Mildly enlarged prostate. Other: No pneumoperitoneum, ascites or focal fluid collection. Partially visualized postsurgical changes from right orchiectomy. Musculoskeletal: No aggressive appearing focal osseous lesions. Moderate thoracolumbar spondylosis. IMPRESSION: 1. Prominent distention of the stomach with food debris. Mild annular wall thickening in the gastric antrum, which is highly nonspecific and could be artifactual due to gastric peristalsis, with gastritis or gastric neoplasm not excluded. Recommend correlation with the reported recent upper endoscopy. 2. Nonspecific fluid levels throughout the nondistended large bowel, potentially related to the reported colonoscopy performed earlier today. No free air. No findings to suggest small or large bowel obstruction or acute small or large bowel inflammation. 3. Coronary atherosclerosis. 4. Mildly enlarged prostate. Electronically Signed   By: JIlona SorrelM.D.   On: 12/26/2016 15:01   Dg Abd Portable 1v  Result Date: 01/13/2017 CLINICAL DATA:  NG tube placement, status post distal gastrectomy and jejunal feeding tube placement 01/10/2017, status post cholecystectomy EXAM: PORTABLE ABDOMEN - 1 VIEW COMPARISON:  12/26/2016 FINDINGS: There is normal small bowel gas pattern. There is NG  feeding tube coiled within proximal stomach with tip in mid stomach. Skin staples are noted in right abdomen adjacent to midline. There is a probable drain in right abdomen. A catheter is noted in left mid abdomen probable percutaneous jejunal tube. IMPRESSION: There is NG feeding tube coiled within proximal stomach with tip in mid stomach. Skin staples are noted in right abdomen adjacent to midline. There is a probable drain in right abdomen. A catheter is noted in left mid abdomen probable percutaneous jejunal tube. Electronically Signed   By: LLahoma CrockerM.D.   On: 01/13/2017 13:40    ECHO 01/20/2017     *[facility]*                         [place],  [id number]                            [phone]  ------------------------------------------------------------------- Transthoracic Echocardiography  Patient:    Jewett, Mcgann MR #:       [id number] Study Date: 01/20/2017 Gender:     M Age:        49 Height:     180.3 cm Weight:     120.9 kg BSA:        2.51 m^2 Pt. Status: Room:       [address]    Stark Klein [id number]  [place], [place]  PERFORMING   [facility], Inpatient  SONOGRAPHER  Mikki Santee  ORDERING     [place], [place] Kishore  REFERRING    [place], [place] Kishore  cc:  ------------------------------------------------------------------- LV EF: 60% -   65%  ------------------------------------------------------------------- History:   PMH:  To assess LV function prior to potentially cardiotoxic chemotherapy.  ------------------------------------------------------------------- Study Conclusions  - Left ventricle: The cavity size was normal. There was mild   concentric hypertrophy. Systolic function was normal. The   estimated ejection fraction was in the range of 60% to 65%. Wall   motion was normal; there were no regional wall motion    abnormalities. Left ventricular diastolic function parameters   were normal. - Aortic valve: Transvalvular velocity was within the normal range.   There was no stenosis. There was no regurgitation. - Mitral valve: Transvalvular velocity was within the normal range.   There was no evidence for stenosis. There was no regurgitation. - Right ventricle: The cavity size was normal. Wall thickness was   normal. Systolic function was normal. - Atrial septum: No defect or patent foramen ovale was identified   by color flow Doppler. - Tricuspid valve: There was no regurgitation.   ASSESSMENT & PLAN:   66 year old African-American male with  1) Newly diagnosed pT4a, pN3b, pM1 invasive moderately-poorly Gastric Adenocarcinoma . LVI + margins neg 15/39 LN +ve, Extracapsular invasion +, some involvement of pancreatic capsule there pM1) Presented with severe pyloric stenosis with ulceration. 60 pound weight loss. S/p distal gastrectomy with jejunal feeding tube placement. Initial CT abdomen and pelvis do not show any local regional lymphadenopathy.  #2 moderate protein calorie malnutrition patient has lost about 60 pounds in the last 2-3 months. Currently started on jejunal tube feeding.  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 -we have requested Her2 status on the gastric tumor. -he is significant risk of metastatic disease as well as local recurrence. -we will followup with him in clinic in 2 weeks with labs. -hewill need baseline scan prior to adjuvant therapy to check for any other residual disease -will need adjuvant doublet chemotherapy +/- herceptin (based on her2 status) in about 3-4 weeks post surgery once abdominal incision have healed  #4 suspected sleep apnea. Has not had a sleep  study. Wife notes significant snoring which is  important with weight loss.  Will have to be careful with excessive sedation.  #5 obesity - has lost about 60 pounds since his symptoms started in November 2017.  F/u with Dr Irene Limbo in 2 weeks post discharge (We will setup this followup)  All of the patients questions were answered with apparent satisfaction. The patient knows to call the clinic with any problems, questions or concerns.  I spent 50 minutes counseling the patient face to face. The total time spent in the appointment was 60 minutes and more than 50% was on counseling and direct patient cares.    Sullivan Lone MD [place] AAHIV[facility] Hematology/Oncology Physician [facility]  (Office):       [phone] (Work cell):  [phone] (Fax):           [phone]

## 2017-01-18 NOTE — Telephone Encounter (Signed)
-----  Message from Brunetta Genera, MD sent at 01/18/2017  2:29 PM EST ----- Regarding: pathology-Her2 neu status Darden Dates, Could you please call pathology and request Her2 neu status testing and Microsatellite instability/MMR status testing on the patient recent surgical sample showing gastric adenocarcinoma.  Thanks, GK

## 2017-01-19 DIAGNOSIS — E440 Moderate protein-calorie malnutrition: Secondary | ICD-10-CM

## 2017-01-19 DIAGNOSIS — D630 Anemia in neoplastic disease: Secondary | ICD-10-CM

## 2017-01-19 DIAGNOSIS — K311 Adult hypertrophic pyloric stenosis: Secondary | ICD-10-CM

## 2017-01-19 DIAGNOSIS — E669 Obesity, unspecified: Secondary | ICD-10-CM

## 2017-01-19 LAB — GLUCOSE, CAPILLARY
Glucose-Capillary: 132 mg/dL — ABNORMAL HIGH (ref 65–99)
Glucose-Capillary: 147 mg/dL — ABNORMAL HIGH (ref 65–99)
Glucose-Capillary: 163 mg/dL — ABNORMAL HIGH (ref 65–99)
Glucose-Capillary: 166 mg/dL — ABNORMAL HIGH (ref 65–99)
Glucose-Capillary: 172 mg/dL — ABNORMAL HIGH (ref 65–99)

## 2017-01-19 LAB — BASIC METABOLIC PANEL
Anion gap: 8 (ref 5–15)
BUN: 12 mg/dL (ref 6–20)
CO2: 24 mmol/L (ref 22–32)
Calcium: 8.3 mg/dL — ABNORMAL LOW (ref 8.9–10.3)
Chloride: 104 mmol/L (ref 101–111)
Creatinine, Ser: 0.89 mg/dL (ref 0.61–1.24)
GFR calc Af Amer: >60 mL/min (ref >60)
GFR calc non Af Amer: >60 mL/min (ref >60)
Glucose, Bld: 162 mg/dL — ABNORMAL HIGH (ref 65–99)
Potassium: 3.6 mmol/L (ref 3.5–5.1)
Sodium: 136 mmol/L (ref 135–145)

## 2017-01-19 LAB — CBC
HCT: 25.8 % — ABNORMAL LOW (ref 39.0–52.0)
Hemoglobin: 8.2 g/dL — ABNORMAL LOW (ref 13.0–17.0)
MCH: 24.6 pg — ABNORMAL LOW (ref 26.0–34.0)
MCHC: 31.8 g/dL (ref 30.0–36.0)
MCV: 77.5 fL — ABNORMAL LOW (ref 78.0–100.0)
Platelets: 240 10*3/uL (ref 150–400)
RBC: 3.33 MIL/uL — ABNORMAL LOW (ref 4.22–5.81)
RDW: 15.5 % (ref 11.5–15.5)
WBC: 12 10*3/uL — ABNORMAL HIGH (ref 4.0–10.5)

## 2017-01-19 MED ORDER — HYDROMORPHONE HCL 2 MG/ML IJ SOLN
0.5000 mg | INTRAMUSCULAR | Status: DC | PRN
  Administered 2017-01-20: 2 mg via INTRAVENOUS
  Administered 2017-01-21: 0.5 mg via INTRAVENOUS
  Administered 2017-01-24 – 2017-01-25 (×4): 1 mg via INTRAVENOUS
  Filled 2017-01-19 (×7): qty 1

## 2017-01-19 NOTE — Progress Notes (Signed)
Physical Therapy Treatment Patient Details Name: [name] MRN: [id number] DOB: 1951/03/17 Today's Date: 01/19/2017    History of Present Illness Pt is a 66 y/o male s/p distal gastrectomy and cholescystectomy secondary to gastric cancer. No pertinent PMH.    PT Comments    Pt presents with increased need to use bathroom. Assisted pt to and from bathroom with min guard for majority of mobility to bathroom and on and off bed. Pt continues to benefit from acute PT services to address stair negotiation prior to discharge home.     Follow Up Recommendations  No PT follow up;Supervision - Intermittent     Equipment Recommendations  None recommended by PT    Recommendations for Other Services       Precautions / Restrictions Precautions Precautions: Fall Precaution Comments: abdominal drain, NG tube to wall suction Restrictions Weight Bearing Restrictions: No    Mobility  Bed Mobility Overal bed mobility: Needs Assistance Bed Mobility: Supine to Sit;Sit to Supine     Supine to sit: Min guard Sit to supine: Min assist   General bed mobility comments: increased time, min A to move bilateral UEs on to bed  Transfers Overall transfer level: Needs assistance Equipment used: Rolling walker (2 wheeled) Transfers: Sit to/from [facility] Sit to Stand: Min guard         General transfer comment: Min guard for safety from EOB and on and off commode  Ambulation/Gait Ambulation/Gait assistance: Min guard Ambulation Distance (Feet): 30 Feet Assistive device: Rolling walker (2 wheeled) Gait Pattern/deviations: Step-through pattern Gait velocity: decreased Gait velocity interpretation: Below normal speed for age/gender General Gait Details: Pt performed gait x short distance to bathroom and back. Was fatigued following using bathroom.    Stairs            Wheelchair Mobility    Modified Rankin (Stroke Patients Only)       Balance                                    Cognition Arousal/Alertness: Awake/alert Behavior During Therapy: WFL for tasks assessed/performed Overall Cognitive Status: Within Functional Limits for tasks assessed                      Exercises      General Comments        Pertinent Vitals/Pain Pain Assessment: 0-10 Pain Score: 1  Pain Location: abdomen Pain Descriptors / Indicators: Sore Pain Intervention(s): Monitored during session;Repositioned    Home Living                      Prior Function            PT Goals (current goals can now be found in the care plan section) Acute Rehab PT Goals Patient Stated Goal: return home Progress towards PT goals: Progressing toward goals    Frequency    Min 3X/week      PT Plan Current plan remains appropriate    Co-evaluation             End of Session Equipment Utilized During Treatment: Gait belt Activity Tolerance: Patient tolerated treatment well Patient left: in bed;in CPM Nurse Communication: Mobility status PT Visit Diagnosis: Other abnormalities of gait and mobility (R26.89);Pain Pain - part of body:  (abdomen)     Time: 7408-1448 PT Time Calculation (min) (ACUTE ONLY): 21 min  Charges:  $Gait Training: 8-22 mins                    G Codes:       Scheryl Marten PT, DPT  [phone]  01/19/2017, 3:56 PM

## 2017-01-19 NOTE — Progress Notes (Signed)
CCMD notified this RN pt had 11 beats of SVT. Pt resting on bed and stated "I had been coughing." denies CP. VSS. Will continue to monitor pt.

## 2017-01-19 NOTE — Progress Notes (Signed)
General Surgery [facility]  Assessment & Plan:  POD#9 -   status post distal gastrectomy for carcinoma, cholecystectomy.  Margins negative, many LN positive.               Will try clamping NGT.    Tube feeds at 40 ml/hr.                        Encouraged OOB, ambulation.  Doing well with PT.  Allow ice chips  Check drain for amyase- pending  Zosyn for purulent drain output.       Stark Klein, MD, [facility]       Office: [phone]    Subjective: No nausea.  NGT output has no evidence of tube feeds.  Incision without erythema.  Drain remains purulent.  Low grade fever.        Objective: Vital signs in last 24 hours: Temp:  [98.1 F (36.7 C)-100.6 F (38.1 C)] 98.2 F (36.8 C) (03/08 0538) Pulse Rate:  [100-117] 108 (03/08 0757) Resp:  [10-22] 10 (03/08 1259) BP: (110-126)/(54-83) 119/54 (03/08 0538) SpO2:  [95 %-100 %] 95 % (03/08 1259) Weight:  [112.9 kg (248 lb 14.4 oz)] 112.9 kg (248 lb 14.4 oz) (03/08 0538) Last BM Date: 01/18/17  Intake/Output from previous day: 03/07 0701 - 03/08 0700 In: 2218.5 [I.V.:1050; GE/XB:2841.3; IV Piggyback:50] Out: 2440 [Urine:300; Emesis/NG output:1500; Drains:10] Intake/Output this shift: No intake/output data recorded.  Physical Exam: HEENT - sclerae clear, mucous membranes moist Neck - soft Chest -breathing comfortably Abdomen - soft, obese; midline wound dry and intact; j tube in place.  Still small amt purulent drainage in tube.   Neuro - alert & oriented, no focal deficits  Lab Results:   Recent Labs  01/18/17 0506 01/19/17 0403  WBC 13.9* 12.0*  HGB 8.8* 8.2*  HCT 27.8* 25.8*  PLT 290 240   BMET  Recent Labs  01/18/17 0506 01/19/17 0403  NA 135 136  K 4.0 3.6  CL 102 104  CO2 24 24  GLUCOSE 204* 162*  BUN 14 12  CREATININE 1.06 0.89  CALCIUM 8.6* 8.3*   PT/INR No results for input(s): LABPROT, INR in the last 72 hours. Comprehensive  Metabolic Panel:    Component Value Date/Time   NA 136 01/19/2017 0403   NA 135 01/18/2017 0506   K 3.6 01/19/2017 0403   K 4.0 01/18/2017 0506   CL 104 01/19/2017 0403   CL 102 01/18/2017 0506   CO2 24 01/19/2017 0403   CO2 24 01/18/2017 0506   BUN 12 01/19/2017 0403   BUN 14 01/18/2017 0506   CREATININE 0.89 01/19/2017 0403   CREATININE 1.06 01/18/2017 0506   GLUCOSE 162 (H) 01/19/2017 0403   GLUCOSE 204 (H) 01/18/2017 0506   CALCIUM 8.3 (L) 01/19/2017 0403   CALCIUM 8.6 (L) 01/18/2017 0506   AST 58 (H) 01/17/2017 0732   AST 24 01/15/2017 0222   ALT 79 (H) 01/17/2017 0732   ALT 23 01/15/2017 0222   ALKPHOS 95 01/17/2017 0732   ALKPHOS 68 01/15/2017 0222   BILITOT 1.4 (H) 01/17/2017 0732   BILITOT 0.6 01/15/2017 0222   PROT 6.0 (L) 01/17/2017 0732   PROT 5.4 (L) 01/15/2017 0222   ALBUMIN 2.4 (L) 01/17/2017 0732   ALBUMIN 2.2 (L) 01/15/2017 0222    Studies/Results: No results found.    [name] 01/19/2017  Patient ID: [name],  male   DOB: 14-Dec-1950, 66 y.o.   MRN: [id number]

## 2017-01-19 NOTE — Progress Notes (Signed)
Nutrition Follow-up  DOCUMENTATION CODES:   Severe malnutrition in context of chronic illness  INTERVENTION:   -Continue Jevity 1.2 @ 30 ml/hr via j-tube and recommend increase by 10 ml every 12 hours to goal rate of 70 ml/hr.   Continue 60 ml Prostat BID.    Tube feeding regimen at goal rate provides 2416 kcal (100% of needs), 153 grams of protein, and 1361 ml of H2O.   If no IVFS, recommend 175 ml free water flush every 4 hours.   NUTRITION DIAGNOSIS:   Malnutrition related to chronic illness as evidenced by moderate depletions of muscle mass, percent weight loss, energy intake < or equal to 75% for > or equal to 1 month, mild fluid accumulation.  Ongoing  GOAL:   Patient will meet greater than or equal to 90% of their needs  Progressing  MONITOR:   I & O's, Weight trends, TF tolerance  REASON FOR ASSESSMENT:   Consult Enteral/tube feeding initiation and management  ASSESSMENT:   Pt with PMH of pre-diabetes, GERD and anemia, presented with a gastric outlet obstruction and gastric cancer. Pt had distal gastrectomy, cholecystectomy, placement of jejunostomy on 2/27  Spoke with pt at bedside, who was just transferred to recliner chair prior to visit. Pt overall reports feeling better, smiling and joking with this RD during this visit.   Pt with NGT connected to low, intermittent suction. Noted approximately 1300 ml output over the past 24 hours per doc flowsheets.   Jevity 1.2 is currently infusing via j-tube at 30 ml/hr. Pt also receiving 60 ml Prostat BID. Regimen currently providing 1264 kcals, 100 grams protein, and 581 ml fluid daily (meeting 52% of estimated kcal needs and 71% of estimated protein needs).   Per discussion with RN, pt, and pt wife, pt is tolerating TF well currently, however, TF rate was decreased yesterday (01/18/17) due to nausea and hiccupping. All confirm no hiccupping, nausea, vomiting, or abdominal pain since decrease in rate. RN reports plan to  increase to goal rate very slowly to promote tolerance.  Pt wife, RN, and RNCM confirm plan to d/c home with TF. Pt wife reports that she will administer feedings at home- supplies have already been delivered to room. RN reports she will also assist with TF education.   Labs reviewed: CBGS: 162-220.  Diet Order:  Diet NPO time specified  Skin:  Reviewed, no issues  Last BM:  01/18/17  Height:   Ht Readings from Last 1 Encounters:  01/16/17 5\' 11"  (1.803 m)    Weight:   Wt Readings from Last 1 Encounters:  01/19/17 248 lb 14.4 oz (112.9 kg)    Ideal Body Weight:  78 kg  BMI:  Body mass index is 34.71 kg/m.  Estimated Nutritional Needs:   Kcal:  2400-2600  Protein:  140-160 grams  Fluid:  >/= 2 L/d  EDUCATION NEEDS:   Education needs addressed  [name] A. Jimmye Norman, RD, LDN, CDE Pager: [phone] After hours Pager: [phone]

## 2017-01-20 ENCOUNTER — Inpatient Hospital Stay (HOSPITAL_COMMUNITY): Payer: BLUE CROSS/BLUE SHIELD

## 2017-01-20 DIAGNOSIS — I472 Ventricular tachycardia: Secondary | ICD-10-CM

## 2017-01-20 LAB — GLUCOSE, CAPILLARY
Glucose-Capillary: 141 mg/dL — ABNORMAL HIGH (ref 65–99)
Glucose-Capillary: 142 mg/dL — ABNORMAL HIGH (ref 65–99)
Glucose-Capillary: 166 mg/dL — ABNORMAL HIGH (ref 65–99)
Glucose-Capillary: 168 mg/dL — ABNORMAL HIGH (ref 65–99)
Glucose-Capillary: 171 mg/dL — ABNORMAL HIGH (ref 65–99)
Glucose-Capillary: 179 mg/dL — ABNORMAL HIGH (ref 65–99)

## 2017-01-20 LAB — BASIC METABOLIC PANEL
Anion gap: 6 (ref 5–15)
BUN: 12 mg/dL (ref 6–20)
CO2: 25 mmol/L (ref 22–32)
Calcium: 8.2 mg/dL — ABNORMAL LOW (ref 8.9–10.3)
Chloride: 106 mmol/L (ref 101–111)
Creatinine, Ser: 0.89 mg/dL (ref 0.61–1.24)
GFR calc Af Amer: >60 mL/min (ref >60)
GFR calc non Af Amer: >60 mL/min (ref >60)
Glucose, Bld: 181 mg/dL — ABNORMAL HIGH (ref 65–99)
Potassium: 3.3 mmol/L — ABNORMAL LOW (ref 3.5–5.1)
Sodium: 137 mmol/L (ref 135–145)

## 2017-01-20 LAB — CBC
HCT: 23.7 % — ABNORMAL LOW (ref 39.0–52.0)
Hemoglobin: 7.3 g/dL — ABNORMAL LOW (ref 13.0–17.0)
MCH: 23.9 pg — ABNORMAL LOW (ref 26.0–34.0)
MCHC: 30.8 g/dL (ref 30.0–36.0)
MCV: 77.5 fL — ABNORMAL LOW (ref 78.0–100.0)
Platelets: 278 10*3/uL (ref 150–400)
RBC: 3.06 MIL/uL — ABNORMAL LOW (ref 4.22–5.81)
RDW: 15.6 % — ABNORMAL HIGH (ref 11.5–15.5)
WBC: 10.8 10*3/uL — ABNORMAL HIGH (ref 4.0–10.5)

## 2017-01-20 LAB — ECHOCARDIOGRAM COMPLETE
Height: 71 in
Weight: 4264.58 oz

## 2017-01-20 MED ORDER — SODIUM CHLORIDE 0.9 % IV SOLN
Freq: Once | INTRAVENOUS | Status: AC
  Administered 2017-01-20: via INTRAVENOUS
  Filled 2017-01-20: qty 1000

## 2017-01-20 MED ORDER — JEVITY 1.2 CAL PO LIQD
1000.0000 mL | ORAL | Status: DC
  Administered 2017-01-21: 1000 mL
  Filled 2017-01-20 (×4): qty 1000

## 2017-01-20 MED ORDER — KCL IN DEXTROSE-NACL 20-5-0.45 MEQ/L-%-% IV SOLN
INTRAVENOUS | Status: DC
  Administered 2017-01-21 – 2017-01-25 (×6): via INTRAVENOUS
  Filled 2017-01-20 (×6): qty 1000

## 2017-01-20 NOTE — Progress Notes (Signed)
PT Cancellation Note  Patient Details Name: [name] MRN: [id number] DOB: [birth year]   Cancelled Treatment:    Reason Eval/Treat Not Completed: Other (comment) (family memeber declined) Pt finishing bath and feeling fatigued. Family member asked if PT could return later. PT will check on pt later as time allows.    Salina April, PTA Pager: [phone]   01/20/2017, 1:39 PM

## 2017-01-20 NOTE — Progress Notes (Signed)
PT Cancellation Note  Patient Details Name: [name] MRN: [id number] DOB: 1951-07-15   Cancelled Treatment:    Reason Eval/Treat Not Completed: Patient at procedure or test/unavailable PT will continue to follow acutely.    Salina April, PTA Pager: [phone]   01/20/2017, 2:58 PM

## 2017-01-20 NOTE — Progress Notes (Signed)
  Echocardiogram 2D Echocardiogram has been performed.  Jennette Dubin 01/20/2017, 3:08 PM

## 2017-01-20 NOTE — Progress Notes (Signed)
General Surgery [facility]  Assessment & Plan:  POD#10 -   status post distal gastrectomy for carcinoma, cholecystectomy.  Margins negative, many LN positive.              NGT clamped.  Residuals still at 400 mL bilious output this AM after being clamped for 8 hours.  Tube feeds at 50 ml/hr.                        Encouraged OOB, ambulation.  Doing well with PT.  Allow ice chips  Check drain for amyase- pending  Clamp NGT again today.   Zosyn for purulent drain output.       Stark Klein, MD, [facility]       Office: [phone]    Subjective: No nausea or pain overnight. Having flatus and BMs.          Objective: Vital signs in last 24 hours: Temp:  [97.5 F (36.4 C)-100.1 F (37.8 C)] 97.8 F (36.6 C) (03/09 0536) Pulse Rate:  [104-110] 110 (03/09 0536) Resp:  [10-19] 18 (03/09 0536) BP: (116-120)/(53-60) 116/60 (03/09 0536) SpO2:  [95 %-100 %] 95 % (03/09 0536) Weight:  [120.9 kg (266 lb 8.6 oz)] 120.9 kg (266 lb 8.6 oz) (03/09 0536) Last BM Date: 01/19/17  Intake/Output from previous day: 03/08 0701 - 03/09 0700 In: 2105 [I.V.:2080; IV Piggyback:25] Out: 508 [Emesis/NG output:500; Drains:8] Intake/Output this shift: No intake/output data recorded.  Physical Exam: HEENT - sclerae clear, mucous membranes moist Neck - soft Chest -breathing comfortably Abdomen - soft, obese; midline wound dry and intact; j tube in place.  Still small amt purulent drainage in tube.   Neuro - alert & oriented, no focal deficits  Lab Results:   Recent Labs  01/18/17 0506 01/19/17 0403  WBC 13.9* 12.0*  HGB 8.8* 8.2*  HCT 27.8* 25.8*  PLT 290 240   BMET  Recent Labs  01/18/17 0506 01/19/17 0403  NA 135 136  K 4.0 3.6  CL 102 104  CO2 24 24  GLUCOSE 204* 162*  BUN 14 12  CREATININE 1.06 0.89  CALCIUM 8.6* 8.3*   PT/INR No results for input(s): LABPROT, INR in the last 72 hours. Comprehensive Metabolic  Panel:    Component Value Date/Time   NA 136 01/19/2017 0403   NA 135 01/18/2017 0506   K 3.6 01/19/2017 0403   K 4.0 01/18/2017 0506   CL 104 01/19/2017 0403   CL 102 01/18/2017 0506   CO2 24 01/19/2017 0403   CO2 24 01/18/2017 0506   BUN 12 01/19/2017 0403   BUN 14 01/18/2017 0506   CREATININE 0.89 01/19/2017 0403   CREATININE 1.06 01/18/2017 0506   GLUCOSE 162 (H) 01/19/2017 0403   GLUCOSE 204 (H) 01/18/2017 0506   CALCIUM 8.3 (L) 01/19/2017 0403   CALCIUM 8.6 (L) 01/18/2017 0506   AST 58 (H) 01/17/2017 0732   AST 24 01/15/2017 0222   ALT 79 (H) 01/17/2017 0732   ALT 23 01/15/2017 0222   ALKPHOS 95 01/17/2017 0732   ALKPHOS 68 01/15/2017 0222   BILITOT 1.4 (H) 01/17/2017 0732   BILITOT 0.6 01/15/2017 0222   PROT 6.0 (L) 01/17/2017 0732   PROT 5.4 (L) 01/15/2017 0222   ALBUMIN 2.4 (L) 01/17/2017 0732   ALBUMIN 2.2 (L) 01/15/2017 0222    Studies/Results: No results found.    [facility] 01/20/2017  Patient  ID: [name], male   DOB: 1951/02/12, 66 y.o.   MRN: [id number]

## 2017-01-20 NOTE — Progress Notes (Signed)
Hgb 7.3, K+ 3.3 Dr Barry Dienes notified, with new orders.

## 2017-01-21 ENCOUNTER — Encounter (HOSPITAL_COMMUNITY): Payer: Self-pay | Admitting: *Deleted

## 2017-01-21 DIAGNOSIS — R778 Other specified abnormalities of plasma proteins: Secondary | ICD-10-CM

## 2017-01-21 DIAGNOSIS — R7989 Other specified abnormal findings of blood chemistry: Secondary | ICD-10-CM

## 2017-01-21 DIAGNOSIS — I4729 Other ventricular tachycardia: Secondary | ICD-10-CM

## 2017-01-21 DIAGNOSIS — I472 Ventricular tachycardia: Secondary | ICD-10-CM

## 2017-01-21 DIAGNOSIS — R748 Abnormal levels of other serum enzymes: Secondary | ICD-10-CM

## 2017-01-21 DIAGNOSIS — C163 Malignant neoplasm of pyloric antrum: Principal | ICD-10-CM

## 2017-01-21 LAB — GLUCOSE, CAPILLARY
Glucose-Capillary: 136 mg/dL — ABNORMAL HIGH (ref 65–99)
Glucose-Capillary: 138 mg/dL — ABNORMAL HIGH (ref 65–99)
Glucose-Capillary: 144 mg/dL — ABNORMAL HIGH (ref 65–99)
Glucose-Capillary: 150 mg/dL — ABNORMAL HIGH (ref 65–99)
Glucose-Capillary: 202 mg/dL — ABNORMAL HIGH (ref 65–99)
Glucose-Capillary: 244 mg/dL — ABNORMAL HIGH (ref 65–99)

## 2017-01-21 LAB — CBC
HCT: 24 % — ABNORMAL LOW (ref 39.0–52.0)
Hemoglobin: 7.7 g/dL — ABNORMAL LOW (ref 13.0–17.0)
MCH: 24.9 pg — ABNORMAL LOW (ref 26.0–34.0)
MCHC: 32.1 g/dL (ref 30.0–36.0)
MCV: 77.7 fL — ABNORMAL LOW (ref 78.0–100.0)
Platelets: 305 10*3/uL (ref 150–400)
RBC: 3.09 MIL/uL — ABNORMAL LOW (ref 4.22–5.81)
RDW: 16 % — ABNORMAL HIGH (ref 11.5–15.5)
WBC: 9.6 10*3/uL (ref 4.0–10.5)

## 2017-01-21 LAB — BASIC METABOLIC PANEL
Anion gap: 10 (ref 5–15)
BUN: 11 mg/dL (ref 6–20)
CO2: 23 mmol/L (ref 22–32)
Calcium: 8 mg/dL — ABNORMAL LOW (ref 8.9–10.3)
Chloride: 105 mmol/L (ref 101–111)
Creatinine, Ser: 0.85 mg/dL (ref 0.61–1.24)
GFR calc Af Amer: >60 mL/min (ref >60)
GFR calc non Af Amer: >60 mL/min (ref >60)
Glucose, Bld: 152 mg/dL — ABNORMAL HIGH (ref 65–99)
Potassium: 4.1 mmol/L (ref 3.5–5.1)
Sodium: 138 mmol/L (ref 135–145)

## 2017-01-21 LAB — TROPONIN I
Troponin I: 0.16 ng/mL (ref <0.03)
Troponin I: 0.03 ng/mL (ref <0.03)
Troponin I: <0.03 ng/mL (ref <0.03)

## 2017-01-21 MED ORDER — METOPROLOL TARTRATE 5 MG/5ML IV SOLN
2.5000 mg | Freq: Four times a day (QID) | INTRAVENOUS | Status: DC
  Administered 2017-01-21 – 2017-01-22 (×3): 2.5 mg via INTRAVENOUS
  Filled 2017-01-21 (×3): qty 5

## 2017-01-21 MED ORDER — JEVITY 1.2 CAL PO LIQD
1000.0000 mL | ORAL | Status: DC
  Administered 2017-01-21: 1000 mL
  Filled 2017-01-21 (×4): qty 1000

## 2017-01-21 NOTE — Progress Notes (Signed)
Patient had 12 beat run of asymptomatic V.Tach. I reviewed the rhythm strip and agree that it was a relatively wide complex tachycardia at rate 150 consistent with V. Tach.  Patient denies recent episodes of dizziness or lightheadedness.  Denies chest pain or shortness of breath. Denies prior cardiac history Says he can run up 2 flights of stairs normally Had cardiac workup 3 years ago as a "baseline".  And was told it was normal Electrolytes this morning normal.  Potassium 4.1.  Plan: EKG and troponin Continue telemetry Consider cardiology consult    Edsel Petrin. Dalbert Batman, M.D., [facility] General and Minimally invasive Surgery Breast and Colorectal Surgery Office:   [phone] Pager:   [phone]

## 2017-01-21 NOTE — Progress Notes (Signed)
Cardiology in seeing pt.  Wife at bedside.

## 2017-01-21 NOTE — Consult Note (Signed)
Requesting provider: Dr. Fanny Skates Consulting cardiologist: Dr. 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 He underwent distal gastrectomy with jejunal feeding tube placement by Dr. Barry Dienes on February 27, still has NG tube in place with diet advancing slowly. He was noted to have an asymptomatic run of NSVT by telemetry, reviewed by Dr. Dalbert Batman with follow-up ECGs and cardiac enzymes obtained. [name] states that at baseline he has had no exertional chest pain, no palpitations or syncope. Reports undergoing a reassuring cardiac workup about 3 years ago. Echocardiogram done yesterday revealed LVEF 60-65% without wall motion abnormalities.  ECG shows sinus rhythm with diffuse nonspecific ST-T wave abnormalities. Initial troponin I is elevated at 0.16.  Allergies  Allergen Reactions  . No Known Allergies     Medications Scheduled Medications: . chlorhexidine  15 mL Mouth Rinse BID  . feeding supplement (PRO-STAT SUGAR FREE 64)  60 mL Per Tube BID  . mouth rinse  15 mL Mouth Rinse q12n4p  . pantoprazole (PROTONIX) IV  40 mg Intravenous QHS  . piperacillin-tazobactam (ZOSYN)  IV  3.375 g Intravenous Q8H    Infusions: . dextrose 5 % and 0.45 % NaCl with KCl 20 mEq/L 100 mL/hr at 01/21/17 0514  . feeding supplement (JEVITY 1.2 CAL)    . naLOXone [facility]) adult infusion for PRURITIS      PRN Medications: chlorproMAZINE (THORAZINE) IV, diphenhydrAMINE **OR** diphenhydrAMINE, hydrALAZINE, HYDROmorphone (DILAUDID) injection, LORazepam, nalbuphine **OR** nalbuphine, naLOXone (NARCAN) adult infusion for PRURITIS, ondansetron (ZOFRAN) IV, ondansetron **OR** ondansetron (ZOFRAN) IV, phenol, [DISCONTINUED] naloxone **AND** sodium chloride  flush   Past Medical History:  Diagnosis Date  . Anemia   . GERD (gastroesophageal reflux disease)   . Pre-diabetes     Past Surgical History:  Procedure Laterality Date  . CHOLECYSTECTOMY N/A 01/10/2017   Procedure: CHOLECYSTECTOMY;  Surgeon: Stark Klein, MD;  Location: [place];  Service: General;  Laterality: N/A;  . COLONOSCOPY    . COLONOSCOPY  10/11/2011   Procedure: COLONOSCOPY;  Surgeon: Daneil Dolin, MD;  [facility];  Service: Endoscopy;  Laterality: N/A;  10:30 AM  . ESOPHAGOGASTRODUODENOSCOPY N/A 12/30/2016   Procedure: ESOPHAGOGASTRODUODENOSCOPY (EGD);  Surgeon: Carol Ada, MD;  Location: [facility];  Service: Endoscopy;  Laterality: N/A;  . GASTRECTOMY N/A 01/10/2017   Procedure: DISTAL GASTRECTOMY, JEJUNUM  FEEDING TUBE PLACEMENT;  Surgeon: Stark Klein, MD;  Location: [place];  Service: General;  Laterality: N/A;  . testicle removed  1982    Family History  Problem Relation Age of Onset  . Prostate cancer Father   . Colon cancer Neg Hx     Social History [name] reports that he has never smoked. He has never used smokeless tobacco. [name] reports that he drinks about 2.4 oz of alcohol per week .  Review of Systems Complete review of systems negative except as otherwise outlined in the clinical summary.  Physical Examination Blood pressure 127/67, pulse (!) 101, temperature 97.5 F (36.4 C), temperature source Oral, resp. rate 18, height 5\' 11"  (1.803 m), weight 264 lb 15.9 oz (120.2 kg), SpO2 99 %.  Intake/Output Summary (Last 24 hours) at 01/21/17 1305 Last data filed at 01/21/17 0653  Gross per 24 hour  Intake          2778.34 ml  Output             1451 ml  Net          1327.34 ml   Telemetry: Currently sinus rhythm. Burst of NSVT noted. Has had rare PVCs otherwise. Personally reviewed.  Gen.: Obese male no distress. NG tube in place. HEENT: Conjunctiva and lids normal, oropharynx clear. Neck: Supple, no elevated JVP or  carotid bruits, no thyromegaly. Lungs: Clear to auscultation, nonlabored breathing at rest. Cardiac: Regular rate and rhythm, no S3 or significant systolic murmur, no pericardial rub. Abdomen: Soft, nontender, bowel sounds present. Extremities: No pitting edema, distal pulses 2+. Skin: Warm and dry. Musculoskeletal: No kyphosis. Neuropsychiatric: Alert and oriented x3, affect grossly appropriate.   Lab Results  Basic Metabolic Panel:  Recent Labs Lab 01/17/17 0732 01/18/17 0506 01/19/17 0403 01/20/17 0926 01/21/17 0433  NA 137 135 136 137 138  K 3.5 4.0 3.6 3.3* 4.1  CL 104 102 104 106 105  CO2 24 24 24 25 23   GLUCOSE 160* 204* 162* 181* 152*  BUN 10 14 12 12 11   CREATININE 0.94 1.06 0.89 0.89 0.85  CALCIUM 8.7* 8.6* 8.3* 8.2* 8.0*    Liver Function Tests:  Recent Labs Lab 01/15/17 0222 01/17/17 0732  AST 24 58*  ALT 23 79*  ALKPHOS 68 95  BILITOT 0.6 1.4*  PROT 5.4* 6.0*  ALBUMIN 2.2* 2.4*    CBC:  Recent Labs Lab 01/17/17 0732 01/18/17 0506 01/19/17 0403 01/20/17 0926 01/21/17 0433  WBC 10.1 13.9* 12.0* 10.8* 9.6  HGB 8.3* 8.8* 8.2* 7.3* 7.7*  HCT 26.3* 27.8* 25.8* 23.7* 24.0*  MCV 76.7* 77.4* 77.5* 77.5* 77.7*  PLT 250 290 240 278 305    Cardiac Enzymes:  Recent Labs Lab 01/21/17 1042  TROPONINI 0.16*    ECG Tracing from 01/21/2017 shows sinus rhythm with diffuse nonspecific ST-T wave abnormalities. Personally reviewed.  Imaging  Abdominal films 01/13/2017: FINDINGS: There is normal small bowel gas pattern. There is NG feeding tube coiled within proximal stomach with tip in mid stomach. Skin staples are noted in right abdomen adjacent to midline. There is a probable drain in right abdomen. A catheter is noted in left mid abdomen probable percutaneous jejunal tube.  IMPRESSION: There is NG feeding tube coiled within proximal stomach with tip in mid stomach. Skin staples are noted in right abdomen adjacent to midline. There is a  probable drain in right abdomen. A catheter is noted in left mid abdomen probable percutaneous jejunal tube.  Echocardiogram 01/20/2017: Study Conclusions  - Left ventricle: The cavity size was normal. There was mild   concentric hypertrophy. Systolic function was normal. The   estimated ejection fraction was in the range of 60% to 65%. Wall   motion was normal; there were no regional wall motion   abnormalities. Left ventricular diastolic function parameters   were normal. - Aortic valve: Transvalvular velocity was within the normal range.   There was no stenosis. There was no regurgitation. - Mitral valve: Transvalvular velocity was within the normal range.   There was no evidence for stenosis. There was no regurgitation. - Right ventricle: The cavity size was normal. Wall thickness was   normal. Systolic function was normal. - Atrial septum: No defect or patent foramen ovale was identified   by color flow Doppler. - Tricuspid valve: There was no regurgitation.  Impression  1. Episode of NSVT noted by telemetry, asymptomatic. Rhythm has been sinus and sinus tachycardia otherwise. Not reporting any chest pain  during hospitalization or with exertion at baseline. Reports reassuring cardiac workup approximately 3 years ago. His ECG is however abnormal with overall nonspecific diffuse ST-T wave abnormalities. Echocardiogram revealed normal LVEF at 60-65% yesterday without wall motion abnormalities. Initial troponin I is elevated at 0.16.  2. Mild elevation in troponin I as outlined. Need to assess trend to better characterize. Could be demand ischemia in the setting of physiologic stressors and recent surgery, otherwise exclude ACS.  3. 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 He underwent distal gastrectomy with jejunal feeding tube placement by Dr. Barry Dienes on February 27. Diet advancing slowly.  4.  Postoperative anemia.  5. History of prediabetes.  Recommendations  Discussed with patient and wife. At this point would trend a full set of cardiac markers to better characterize etiology and follow-up ECG in the morning. Continue telemetry monitoring. Also adding low-dose IV Lopressor for now,  not taking any oral medications as yet. Our service will continue to follow with you.  Satira Sark, M.D., F.A.C.C.

## 2017-01-21 NOTE — Progress Notes (Signed)
CRITICAL VALUE ALERT  Critical value received:  Triponin 0.16  Date of notification:  01/21/17   Time of notification:  7408  Critical value read back:Yes.     Nurse who received alert:  Charlies Silvers, RN   MD notified (1st page):  Domenic Polite, MD  Time of first page:  1245  Time MD responded: 1245

## 2017-01-21 NOTE — Progress Notes (Signed)
Dr. Dalbert Batman on floor to assess pt. Will obtain EKG, troponin.

## 2017-01-21 NOTE — Progress Notes (Signed)
Updated wife on plan of care.  

## 2017-01-21 NOTE — Progress Notes (Signed)
200 ml of NGT residual this AM

## 2017-01-21 NOTE — Progress Notes (Signed)
Pt had 12 beats of VTach this am, texted to Will, PA for CCS. Pt without any complaints at present.

## 2017-01-21 NOTE — Progress Notes (Signed)
11 Days Post-Op  Subjective: Doing a little better.  No nausea. NG residual 200 mL this morning which is less. Passing flatus and having stools.  Feels a little hungry.  Objective: Vital signs in last 24 hours: Temp:  [97.7 F (36.5 C)-98.4 F (36.9 C)] 98.2 F (36.8 C) (03/10 0501) Pulse Rate:  [104-107] 104 (03/10 0501) Resp:  [17] 17 (03/10 0501) BP: (121-126)/(56-66) 124/60 (03/10 0501) SpO2:  [99 %-100 %] 100 % (03/10 0501) Weight:  [120.2 kg (264 lb 15.9 oz)] 120.2 kg (264 lb 15.9 oz) (03/10 0501) Last BM Date: 01/20/17  Intake/Output from previous day: 03/09 0701 - 03/10 0700 In: 2778.3 [P.O.:30; I.V.:841.7; NG/GT:1356.7; IV Piggyback:150] Out: 7782 [Urine:950; Emesis/NG output:880; Drains:20; Stool:1] Intake/Output this shift: No intake/output data recorded.   Physical Exam: HEENT - sclerae clear, mucous membranes moist Neck - soft Chest -clear to auscultation bilaterally Abdomen - soft, obese; not distended.  Active bowel sounds.  Midline wound clean.   j tube in place.  Still small amt purulent drainage in tube.   Neuro - alert & oriented, no focal deficits   Lab Results:  Results for orders placed or performed during the hospital encounter of 01/10/17 (from the past 24 hour(s))  CBC     Status: Abnormal   Collection Time: 01/20/17  9:26 AM  Result Value Ref Range   WBC 10.8 (H) 4.0 - 10.5 K/uL   RBC 3.06 (L) 4.22 - 5.81 MIL/uL   Hemoglobin 7.3 (L) 13.0 - 17.0 g/dL   HCT 23.7 (L) 39.0 - 52.0 %   MCV 77.5 (L) 78.0 - 100.0 fL   MCH 23.9 (L) 26.0 - 34.0 pg   MCHC 30.8 30.0 - 36.0 g/dL   RDW 15.6 (H) 11.5 - 15.5 %   Platelets 278 150 - 400 K/uL  Basic metabolic panel     Status: Abnormal   Collection Time: 01/20/17  9:26 AM  Result Value Ref Range   Sodium 137 135 - 145 mmol/L   Potassium 3.3 (L) 3.5 - 5.1 mmol/L   Chloride 106 101 - 111 mmol/L   CO2 25 22 - 32 mmol/L   Glucose, Bld 181 (H) 65 - 99 mg/dL   BUN 12 6 - 20 mg/dL   Creatinine, Ser 0.89 0.61  - 1.24 mg/dL   Calcium 8.2 (L) 8.9 - 10.3 mg/dL   GFR calc non Af Amer >60 >60 mL/min   GFR calc Af Amer >60 >60 mL/min   Anion gap 6 5 - 15  Glucose, capillary     Status: Abnormal   Collection Time: 01/20/17 12:36 PM  Result Value Ref Range   Glucose-Capillary 166 (H) 65 - 99 mg/dL  Glucose, capillary     Status: Abnormal   Collection Time: 01/20/17  5:38 PM  Result Value Ref Range   Glucose-Capillary 168 (H) 65 - 99 mg/dL  Glucose, capillary     Status: Abnormal   Collection Time: 01/20/17  8:07 PM  Result Value Ref Range   Glucose-Capillary 171 (H) 65 - 99 mg/dL  Glucose, capillary     Status: Abnormal   Collection Time: 01/21/17 12:13 AM  Result Value Ref Range   Glucose-Capillary 136 (H) 65 - 99 mg/dL  Glucose, capillary     Status: Abnormal   Collection Time: 01/21/17  4:20 AM  Result Value Ref Range   Glucose-Capillary 138 (H) 65 - 99 mg/dL  CBC     Status: Abnormal   Collection Time: 01/21/17  4:33 AM  Result Value Ref Range   WBC 9.6 4.0 - 10.5 K/uL   RBC 3.09 (L) 4.22 - 5.81 MIL/uL   Hemoglobin 7.7 (L) 13.0 - 17.0 g/dL   HCT 24.0 (L) 39.0 - 52.0 %   MCV 77.7 (L) 78.0 - 100.0 fL   MCH 24.9 (L) 26.0 - 34.0 pg   MCHC 32.1 30.0 - 36.0 g/dL   RDW 16.0 (H) 11.5 - 15.5 %   Platelets 305 150 - 400 K/uL  Basic metabolic panel     Status: Abnormal   Collection Time: 01/21/17  4:33 AM  Result Value Ref Range   Sodium 138 135 - 145 mmol/L   Potassium 4.1 3.5 - 5.1 mmol/L   Chloride 105 101 - 111 mmol/L   CO2 23 22 - 32 mmol/L   Glucose, Bld 152 (H) 65 - 99 mg/dL   BUN 11 6 - 20 mg/dL   Creatinine, Ser 0.85 0.61 - 1.24 mg/dL   Calcium 8.0 (L) 8.9 - 10.3 mg/dL   GFR calc non Af Amer >60 >60 mL/min   GFR calc Af Amer >60 >60 mL/min   Anion gap 10 5 - 15  Glucose, capillary     Status: Abnormal   Collection Time: 01/21/17  7:50 AM  Result Value Ref Range   Glucose-Capillary 150 (H) 65 - 99 mg/dL     Studies/Results: No results found.  . chlorhexidine  15 mL  Mouth Rinse BID  . feeding supplement (PRO-STAT SUGAR FREE 64)  60 mL Per Tube BID  . mouth rinse  15 mL Mouth Rinse q12n4p  . pantoprazole (PROTONIX) IV  40 mg Intravenous QHS  . piperacillin-tazobactam (ZOSYN)  IV  3.375 g Intravenous Q8H  . scopolamine  1 patch Transdermal Once     Assessment/Plan: s/p Procedure(s): DISTAL GASTRECTOMY, JEJUNUM  FEEDING TUBE PLACEMENT CHOLECYSTECTOMY   POD #11.  Distal gastrectomy for carcinoma, Bilroth 2 anastomosis, cholecystectomy, simple feeding jejunostomy tube. Margins negative, positive lymph nodes. Continue NG clamping, but since residuals are down start clear liquid diet Increased tube feeds to 60 mL per hour.  Goal rate is 70. Continue mobilization with PT Drain amylase is still pending after 48 hours  @PROBHOSP @  LOS: 11 days    Kensi Karr M 01/21/2017  . .prob

## 2017-01-22 LAB — GLUCOSE, CAPILLARY
Glucose-Capillary: 118 mg/dL — ABNORMAL HIGH (ref 65–99)
Glucose-Capillary: 149 mg/dL — ABNORMAL HIGH (ref 65–99)
Glucose-Capillary: 151 mg/dL — ABNORMAL HIGH (ref 65–99)
Glucose-Capillary: 153 mg/dL — ABNORMAL HIGH (ref 65–99)
Glucose-Capillary: 165 mg/dL — ABNORMAL HIGH (ref 65–99)
Glucose-Capillary: 172 mg/dL — ABNORMAL HIGH (ref 65–99)

## 2017-01-22 LAB — TROPONIN I: Troponin I: <0.03 ng/mL (ref <0.03)

## 2017-01-22 LAB — BASIC METABOLIC PANEL
Anion gap: 11 (ref 5–15)
BUN: 10 mg/dL (ref 6–20)
CO2: 24 mmol/L (ref 22–32)
Calcium: 8.3 mg/dL — ABNORMAL LOW (ref 8.9–10.3)
Chloride: 103 mmol/L (ref 101–111)
Creatinine, Ser: 0.9 mg/dL (ref 0.61–1.24)
GFR calc Af Amer: >60 mL/min (ref >60)
GFR calc non Af Amer: >60 mL/min (ref >60)
Glucose, Bld: 123 mg/dL — ABNORMAL HIGH (ref 65–99)
Potassium: 3.9 mmol/L (ref 3.5–5.1)
Sodium: 138 mmol/L (ref 135–145)

## 2017-01-22 LAB — CBC
HCT: 24.8 % — ABNORMAL LOW (ref 39.0–52.0)
Hemoglobin: 7.7 g/dL — ABNORMAL LOW (ref 13.0–17.0)
MCH: 24 pg — ABNORMAL LOW (ref 26.0–34.0)
MCHC: 31 g/dL (ref 30.0–36.0)
MCV: 77.3 fL — ABNORMAL LOW (ref 78.0–100.0)
Platelets: 426 10*3/uL — ABNORMAL HIGH (ref 150–400)
RBC: 3.21 MIL/uL — ABNORMAL LOW (ref 4.22–5.81)
RDW: 15.7 % — ABNORMAL HIGH (ref 11.5–15.5)
WBC: 13.9 10*3/uL — ABNORMAL HIGH (ref 4.0–10.5)

## 2017-01-22 MED ORDER — METOPROLOL TARTRATE 5 MG/5ML IV SOLN
5.0000 mg | Freq: Four times a day (QID) | INTRAVENOUS | Status: DC
  Administered 2017-01-22 – 2017-01-23 (×4): 5 mg via INTRAVENOUS
  Filled 2017-01-22 (×5): qty 5

## 2017-01-22 MED ORDER — JEVITY 1.2 CAL PO LIQD
1000.0000 mL | ORAL | Status: DC
  Administered 2017-01-22 – 2017-01-27 (×4): 1000 mL
  Filled 2017-01-22 (×11): qty 1000

## 2017-01-22 NOTE — Progress Notes (Signed)
PT Cancellation Note  Patient Details Name: [name] MRN: [id number] DOB: 03/14/1951   Cancelled Treatment:    Reason Eval/Treat Not Completed: Other (comment) (Pt concerned about missing the MD this am and wants his wife present for treatment.  Will continue efforts if time permits.  )   [name] 01/22/2017, 9:50 AM Governor Rooks, PTA pager [phone]

## 2017-01-22 NOTE — Progress Notes (Signed)
12 Days Post-Op  Subjective: Doing better.  Having stools.  Tolerating tube feedings at 60 cc/hr..  Tolerating NG clamping without nausea or vomiting.  Tolerating oral clear liquid diet.  No further cardiac events.  No chest pain or shortness of breath. [place] cardiology consultation and interventions.  Objective: Vital signs in last 24 hours: Temp:  [97.5 F (36.4 C)-99.1 F (37.3 C)] 98.2 F (36.8 C) (03/11 0421) Pulse Rate:  [88-111] 111 (03/11 0421) Resp:  [17-18] 18 (03/11 0421) BP: (123-135)/(64-71) 128/71 (03/11 0421) SpO2:  [97 %-99 %] 97 % (03/10 2214) Weight:  [122.4 kg (269 lb 13.5 oz)] 122.4 kg (269 lb 13.5 oz) (03/11 0421) Last BM Date: 01/21/17  Intake/Output from previous day: 03/10 0701 - 03/11 0700 In: 41 [P.O.:60] Out: 255 [Urine:250; Drains:5] Intake/Output this shift: Total I/O In: -  Out: 250 [Urine:250]   Physical Exam: HEENT - sclerae clear, mucous membranes moist Neck - soft Chest -clear to auscultation bilaterally Abdomen - soft, obese; not distended.  Active bowel sounds.  Midline wound clean.   j tube in place. Still small amt purulent drainage in tube.  Neuro - alert & oriented, no focal deficits    Lab Results:  Results for orders placed or performed during the hospital encounter of 01/10/17 (from the past 24 hour(s))  Troponin I     Status: Abnormal   Collection Time: 01/21/17 10:42 AM  Result Value Ref Range   Troponin I 0.16 (HH) <0.03 ng/mL  Glucose, capillary     Status: Abnormal   Collection Time: 01/21/17 11:25 AM  Result Value Ref Range   Glucose-Capillary 202 (H) 65 - 99 mg/dL  Troponin I (q 6hr x 3)     Status: Abnormal   Collection Time: 01/21/17  3:10 PM  Result Value Ref Range   Troponin I 0.03 (HH) <0.03 ng/mL  Glucose, capillary     Status: Abnormal   Collection Time: 01/21/17  4:09 PM  Result Value Ref Range   Glucose-Capillary 244 (H) 65 - 99 mg/dL  Troponin I (q 6hr x 3)     Status: None   Collection Time:  01/21/17  7:00 PM  Result Value Ref Range   Troponin I <0.03 <0.03 ng/mL  Glucose, capillary     Status: Abnormal   Collection Time: 01/21/17  8:08 PM  Result Value Ref Range   Glucose-Capillary 144 (H) 65 - 99 mg/dL  Glucose, capillary     Status: Abnormal   Collection Time: 01/22/17 12:17 AM  Result Value Ref Range   Glucose-Capillary 118 (H) 65 - 99 mg/dL  Troponin I (q 6hr x 3)     Status: None   Collection Time: 01/22/17  1:32 AM  Result Value Ref Range   Troponin I <0.03 <0.03 ng/mL  CBC     Status: Abnormal   Collection Time: 01/22/17  1:32 AM  Result Value Ref Range   WBC 13.9 (H) 4.0 - 10.5 K/uL   RBC 3.21 (L) 4.22 - 5.81 MIL/uL   Hemoglobin 7.7 (L) 13.0 - 17.0 g/dL   HCT 24.8 (L) 39.0 - 52.0 %   MCV 77.3 (L) 78.0 - 100.0 fL   MCH 24.0 (L) 26.0 - 34.0 pg   MCHC 31.0 30.0 - 36.0 g/dL   RDW 15.7 (H) 11.5 - 15.5 %   Platelets 426 (H) 150 - 400 K/uL  Basic metabolic panel     Status: Abnormal   Collection Time: 01/22/17  1:32 AM  Result Value Ref  Range   Sodium 138 135 - 145 mmol/L   Potassium 3.9 3.5 - 5.1 mmol/L   Chloride 103 101 - 111 mmol/L   CO2 24 22 - 32 mmol/L   Glucose, Bld 123 (H) 65 - 99 mg/dL   BUN 10 6 - 20 mg/dL   Creatinine, Ser 0.90 0.61 - 1.24 mg/dL   Calcium 8.3 (L) 8.9 - 10.3 mg/dL   GFR calc non Af Amer >60 >60 mL/min   GFR calc Af Amer >60 >60 mL/min   Anion gap 11 5 - 15  Glucose, capillary     Status: Abnormal   Collection Time: 01/22/17  4:20 AM  Result Value Ref Range   Glucose-Capillary 172 (H) 65 - 99 mg/dL  Glucose, capillary     Status: Abnormal   Collection Time: 01/22/17  7:57 AM  Result Value Ref Range   Glucose-Capillary 149 (H) 65 - 99 mg/dL     Studies/Results: No results found.  . chlorhexidine  15 mL Mouth Rinse BID  . feeding supplement (PRO-STAT SUGAR FREE 64)  60 mL Per Tube BID  . mouth rinse  15 mL Mouth Rinse q12n4p  . metoprolol  2.5 mg Intravenous Q6H  . pantoprazole (PROTONIX) IV  40 mg Intravenous QHS  .  piperacillin-tazobactam (ZOSYN)  IV  3.375 g Intravenous Q8H     Assessment/Plan: s/p Procedure(s): DISTAL GASTRECTOMY, JEJUNUM  FEEDING TUBE PLACEMENT CHOLECYSTECTOMY   POD #12.  Distal gastrectomy for carcinoma, Bilroth 2 anastomosis, cholecystectomy, simple feeding jejunostomy tube. Margins negative, positive lymph nodes. Discontinue NG tube Full liquid diet Increased tube feeds to 70 mL/h, which is goal rate Continue mobilization with PT Drain amylase is still pending after 72 hours  12 beat run of V. tach yesterday.  Nonsustained.  Asymptomatic. Mild elevation of troponin Trending troponins Cardiology involved and greatly appreciated  Acute blood loss anemia.  Hemoglobin 7.7.  Tolerating well.    @PROBHOSP @  LOS: 12 days    Dea Bitting M 01/22/2017  . .prob

## 2017-01-22 NOTE — Progress Notes (Signed)
Progress Note  Patient Name: [name] Date of Encounter: 01/22/2017  Consulting Cardiologist: Dr. Satira Sark  Subjective   No chest pain or shortness of breath. Tolerating PO's so far. Had NGT taken out today.  Inpatient Medications    Scheduled Meds: . chlorhexidine  15 mL Mouth Rinse BID  . feeding supplement (PRO-STAT SUGAR FREE 64)  60 mL Per Tube BID  . mouth rinse  15 mL Mouth Rinse q12n4p  . metoprolol  2.5 mg Intravenous Q6H  . pantoprazole (PROTONIX) IV  40 mg Intravenous QHS  . piperacillin-tazobactam (ZOSYN)  IV  3.375 g Intravenous Q8H   Continuous Infusions: . dextrose 5 % and 0.45 % NaCl with KCl 20 mEq/L 50 mL/hr at 01/22/17 0846  . feeding supplement (JEVITY 1.2 CAL)    . naLOXone (NARCAN) adult infusion for PRURITIS     PRN Meds: chlorproMAZINE (THORAZINE) IV, diphenhydrAMINE **OR** diphenhydrAMINE, hydrALAZINE, HYDROmorphone (DILAUDID) injection, LORazepam, nalbuphine **OR** nalbuphine, naLOXone (NARCAN) adult infusion for PRURITIS, ondansetron (ZOFRAN) IV, ondansetron **OR** ondansetron (ZOFRAN) IV, phenol, [DISCONTINUED] naloxone **AND** sodium chloride flush   Vital Signs    Vitals:   01/21/17 1029 01/21/17 1343 01/21/17 2214 01/22/17 0421  BP: 127/67 123/67 135/64 128/71  Pulse: (!) 101 88 (!) 101 (!) 111  Resp: 18 17 17 18   Temp: 97.5 F (36.4 C) 99.1 F (37.3 C) 99 F (37.2 C) 98.2 F (36.8 C)  TempSrc: Oral Oral Oral Oral  SpO2: 99% 99% 97%   Weight:    269 lb 13.5 oz (122.4 kg)  Height:        Intake/Output Summary (Last 24 hours) at 01/22/17 0918 Last data filed at 01/22/17 0720  Gross per 24 hour  Intake               60 ml  Output              505 ml  Net             -445 ml   Filed Weights   01/20/17 0536 01/21/17 0501 01/22/17 0421  Weight: 266 lb 8.6 oz (120.9 kg) 264 lb 15.9 oz (120.2 kg) 269 lb 13.5 oz (122.4 kg)    Telemetry    Sinus rhythm and sinus tachycardia, rare PVC. Personally reviewed.  ECG      Tracing from 01/21/2017 shows sinus rhythm with ST-T wave abnormalities, rule out anterolateral ischemia. Personally reviewed.  Physical Exam   GEN: No acute distress.   Neck: No JVD. Cardiac: RRR, no murmur, rub, or gallop.  Respiratory: Nonlabored. Clear to auscultation bilaterally. GI: Soft, nontender, bowel sounds present. MS: No edema; No deformity.  Labs    Chemistry Recent Labs Lab 01/17/17 0732  01/20/17 0926 01/21/17 0433 01/22/17 0132  NA 137  < > 137 138 138  K 3.5  < > 3.3* 4.1 3.9  CL 104  < > 106 105 103  CO2 24  < > 25 23 24   GLUCOSE 160*  < > 181* 152* 123*  BUN 10  < > 12 11 10   CREATININE 0.94  < > 0.89 0.85 0.90  CALCIUM 8.7*  < > 8.2* 8.0* 8.3*  PROT 6.0*  --   --   --   --   ALBUMIN 2.4*  --   --   --   --   AST 58*  --   --   --   --   ALT 79*  --   --   --   --  ALKPHOS 95  --   --   --   --   BILITOT 1.4*  --   --   --   --   GFRNONAA >60  < > >60 >60 >60  GFRAA >60  < > >60 >60 >60  ANIONGAP 9  < > 6 10 11   < > = values in this interval not displayed.   Hematology Recent Labs Lab 01/20/17 0926 01/21/17 0433 01/22/17 0132  WBC 10.8* 9.6 13.9*  RBC 3.06* 3.09* 3.21*  HGB 7.3* 7.7* 7.7*  HCT 23.7* 24.0* 24.8*  MCV 77.5* 77.7* 77.3*  MCH 23.9* 24.9* 24.0*  MCHC 30.8 32.1 31.0  RDW 15.6* 16.0* 15.7*  PLT 278 305 426*    Cardiac Enzymes Recent Labs Lab 01/21/17 1042 01/21/17 1510 01/21/17 1900 01/22/17 0132  TROPONINI 0.16* 0.03* <0.03 <0.03   No results for input(s): TROPIPOC in the last 168 hours.   Radiology    No results found.  Cardiac Studies   Echocardiogram 01/20/2017: Study Conclusions  - Left ventricle: The cavity size was normal. There was mild   concentric hypertrophy. Systolic function was normal. The   estimated ejection fraction was in the range of 60% to 65%. Wall   motion was normal; there were no regional wall motion   abnormalities. Left ventricular diastolic function parameters   were normal. -  Aortic valve: Transvalvular velocity was within the normal range.   There was no stenosis. There was no regurgitation. - Mitral valve: Transvalvular velocity was within the normal range.   There was no evidence for stenosis. There was no regurgitation. - Right ventricle: The cavity size was normal. Wall thickness was   normal. Systolic function was normal. - Atrial septum: No defect or patent foramen ovale was identified   by color flow Doppler. - Tricuspid valve: There was no regurgitation.  Patient Profile     66 y.o. 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 Barry Dienes on February 27. Noted to have asymptomatic NSVT by telemetry, troponin I level 0.16 in the absence of chest pain and subsequently lower, not suggestive of definitive ACS. LVEF is 60-65%. ECG abnormal does not exclude ischemic changes.  Assessment & Plan    1. NSVT, Limited episode of asymptomatic. LVEF normal range.  2. Minor increase in troponin I in the absence of chest pain. ECG does not exclude ischemic changes however. Could be demand ischemia in the postoperative setting with elevated heart rate and anemia. Have placed him on beta blocker.  3. Postoperative anemia, hemoglobin 7.7 and stable.  Looks better today, NG tube out. For now will plan to continue IV Lopressor and then convert to an oral regimen tomorrow if still tolerating orals. Not clear that further ischemic testing is needed at this time, would continue to follow telemetry.  Signed, Rozann Lesches, MD  01/22/2017, 9:18 AM

## 2017-01-23 ENCOUNTER — Other Ambulatory Visit: Payer: Self-pay | Admitting: *Deleted

## 2017-01-23 ENCOUNTER — Other Ambulatory Visit: Payer: BLUE CROSS/BLUE SHIELD

## 2017-01-23 ENCOUNTER — Ambulatory Visit: Payer: BLUE CROSS/BLUE SHIELD | Admitting: Hematology

## 2017-01-23 DIAGNOSIS — C169 Malignant neoplasm of stomach, unspecified: Secondary | ICD-10-CM

## 2017-01-23 DIAGNOSIS — R9431 Abnormal electrocardiogram [ECG] [EKG]: Secondary | ICD-10-CM

## 2017-01-23 LAB — GLUCOSE, CAPILLARY
Glucose-Capillary: 126 mg/dL — ABNORMAL HIGH (ref 65–99)
Glucose-Capillary: 144 mg/dL — ABNORMAL HIGH (ref 65–99)
Glucose-Capillary: 149 mg/dL — ABNORMAL HIGH (ref 65–99)
Glucose-Capillary: 160 mg/dL — ABNORMAL HIGH (ref 65–99)
Glucose-Capillary: 165 mg/dL — ABNORMAL HIGH (ref 65–99)
Glucose-Capillary: 165 mg/dL — ABNORMAL HIGH (ref 65–99)

## 2017-01-23 MED ORDER — METOPROLOL TARTRATE 25 MG/10 ML ORAL SUSPENSION
25.0000 mg | Freq: Two times a day (BID) | ORAL | Status: DC
  Administered 2017-01-23: 25 mg via JEJUNOSTOMY
  Filled 2017-01-23 (×2): qty 10

## 2017-01-23 MED ORDER — METOPROLOL TARTRATE 5 MG/5ML IV SOLN
5.0000 mg | Freq: Four times a day (QID) | INTRAVENOUS | Status: DC
  Administered 2017-01-24 – 2017-01-25 (×4): 5 mg via INTRAVENOUS
  Filled 2017-01-23 (×4): qty 5

## 2017-01-23 MED ORDER — PANTOPRAZOLE SODIUM 40 MG PO PACK
40.0000 mg | PACK | Freq: Every day | ORAL | Status: DC
  Filled 2017-01-23: qty 20

## 2017-01-23 MED ORDER — PANTOPRAZOLE SODIUM 40 MG IV SOLR
40.0000 mg | Freq: Every day | INTRAVENOUS | Status: DC
  Administered 2017-01-23 – 2017-01-24 (×2): 40 mg via INTRAVENOUS
  Filled 2017-01-23 (×2): qty 40

## 2017-01-23 MED ORDER — METOPROLOL TARTRATE 25 MG PO TABS: 25.0000 mg | ORAL_TABLET | Freq: Two times a day (BID) | ORAL | Status: DC

## 2017-01-23 NOTE — Progress Notes (Signed)
Progress Note  Patient Name: [name] Date of Encounter: 01/23/2017  Primary Cardiologist: Einar Gip  Subjective   No cardiac complaints. Generally well, optimistic.  Inpatient Medications    Scheduled Meds: . chlorhexidine  15 mL Mouth Rinse BID  . feeding supplement (PRO-STAT SUGAR FREE 64)  60 mL Per Tube BID  . mouth rinse  15 mL Mouth Rinse q12n4p  . metoprolol tartrate  25 mg Per J Tube BID  . pantoprazole sodium  40 mg Per Tube QHS  . piperacillin-tazobactam (ZOSYN)  IV  3.375 g Intravenous Q8H   Continuous Infusions: . dextrose 5 % and 0.45 % NaCl with KCl 20 mEq/L 50 mL/hr at 01/23/17 1038  . feeding supplement (JEVITY 1.2 CAL) 1,000 mL (01/23/17 0832)  . naLOXone (NARCAN) adult infusion for PRURITIS     PRN Meds: chlorproMAZINE (THORAZINE) IV, diphenhydrAMINE **OR** diphenhydrAMINE, hydrALAZINE, HYDROmorphone (DILAUDID) injection, LORazepam, nalbuphine **OR** nalbuphine, naLOXone (NARCAN) adult infusion for PRURITIS, ondansetron (ZOFRAN) IV, ondansetron **OR** ondansetron (ZOFRAN) IV, phenol, [DISCONTINUED] naloxone **AND** sodium chloride flush   Vital Signs    Vitals:   01/22/17 0421 01/22/17 1300 01/23/17 0100 01/23/17 0428  BP: 128/71 (!) 113/59 123/64 (!) 120/59  Pulse: (!) 111 94 (!) 101 91  Resp: 18 18  18   Temp: 98.2 F (36.8 C) 98.7 F (37.1 C)  99.3 F (37.4 C)  TempSrc: Oral Oral  Oral  SpO2:  99%  99%  Weight: 122.4 kg (269 lb 13.5 oz)   122.4 kg (269 lb 13.5 oz)  Height:        Intake/Output Summary (Last 24 hours) at 01/23/17 1212 Last data filed at 01/23/17 0428  Gross per 24 hour  Intake                0 ml  Output              595 ml  Net             -595 ml   Filed Weights   01/21/17 0501 01/22/17 0421 01/23/17 0428  Weight: 120.2 kg (264 lb 15.9 oz) 122.4 kg (269 lb 13.5 oz) 122.4 kg (269 lb 13.5 oz)    Telemetry    NSR, no VT, very rare PVCs - Personally Reviewed  ECG    NSR, anterolateral St-T changes - Personally  Reviewed  Physical Exam  comfortable GEN: No acute distress.   Neck: No JVD Cardiac: RRR, no murmurs, rubs, or gallops.  Respiratory: Clear to auscultation bilaterally. GI: Soft, nontender, non-distended  MS: No edema; No deformity. Neuro:  Nonfocal  Psych: Normal affect   Labs    Chemistry Recent Labs Lab 01/17/17 0732  01/20/17 0926 01/21/17 0433 01/22/17 0132  NA 137  < > 137 138 138  K 3.5  < > 3.3* 4.1 3.9  CL 104  < > 106 105 103  CO2 24  < > 25 23 24   GLUCOSE 160*  < > 181* 152* 123*  BUN 10  < > 12 11 10   CREATININE 0.94  < > 0.89 0.85 0.90  CALCIUM 8.7*  < > 8.2* 8.0* 8.3*  PROT 6.0*  --   --   --   --   ALBUMIN 2.4*  --   --   --   --   AST 58*  --   --   --   --   ALT 79*  --   --   --   --   Arabella Merles  95  --   --   --   --   BILITOT 1.4*  --   --   --   --   GFRNONAA >60  < > >60 >60 >60  GFRAA >60  < > >60 >60 >60  ANIONGAP 9  < > 6 10 11   < > = values in this interval not displayed.   Hematology Recent Labs Lab 01/20/17 0926 01/21/17 0433 01/22/17 0132  WBC 10.8* 9.6 13.9*  RBC 3.06* 3.09* 3.21*  HGB 7.3* 7.7* 7.7*  HCT 23.7* 24.0* 24.8*  MCV 77.5* 77.7* 77.3*  MCH 23.9* 24.9* 24.0*  MCHC 30.8 32.1 31.0  RDW 15.6* 16.0* 15.7*  PLT 278 305 426*    Cardiac Enzymes Recent Labs Lab 01/21/17 1042 01/21/17 1510 01/21/17 1900 01/22/17 0132  TROPONINI 0.16* 0.03* <0.03 <0.03   No results for input(s): TROPIPOC in the last 168 hours.   BNPNo results for input(s): BNP, PROBNP in the last 168 hours.   DDimer No results for input(s): DDIMER in the last 168 hours.   Radiology    No results found.  Cardiac Studies   Echo 01/20/2017:  - Left ventricle: The cavity size was normal. There was mild concentric hypertrophy. Systolic function was normal. The estimated ejection fraction was in the range of 60% to 65%. Wall motion was normal; there were no regional wall motion abnormalities. Left ventricular diastolic function parameters were  normal. - Aortic valve: Transvalvular velocity was within the normal range. There was no stenosis. There was no regurgitation. - Mitral valve: Transvalvular velocity was within the normal range. There was no evidence for stenosis. There was no regurgitation. - Right ventricle: The cavity size was normal. Wall thickness was normal. Systolic function was normal. - Atrial septum: No defect or patent foramen ovale was identified by color flow Doppler. - Tricuspid valve: There was no regurgitation. Patient Profile     66 y.o. male with NSVT at presentation with gastric outlet obstruction, now resolved on low dose beta blocker  Assessment & Plan    1. VT:  Electrolytes were normal and echo shows no structural abnormalities. ECG is abnormal at baseline and there is (very mild) coronary calcification, but he reports no angina and he had a "normal nuclear test: in Dr. Irven Shelling office within the last 2 years. Will switch from IV to PO beta blockers, but no additional cardiac workup or treatment appears to be necessary at this time. Will arrange outpatient f/u with Dr. Einar Gip at [place].  Signed, Sanda Klein, MD  01/23/2017, 12:12 PM

## 2017-01-23 NOTE — Progress Notes (Signed)
PT Cancellation Note  Patient Details Name: [name] MRN: [id number] DOB: November 04, 1951   Cancelled Treatment:    Reason Eval/Treat Not Completed: Fatigue/lethargy limiting ability to participate.  Pt declining PT and any mobility at this time stating he is tired and just not feeling well.  Will try another time.     [place], [place] 01/23/2017, 9:04 AM

## 2017-01-23 NOTE — Progress Notes (Signed)
13 Days Post-Op  Subjective: Pt doing better.  Tolerating J tube feeds.  They did not get advanced to goal yesterday.  Tolerating small amounts of full liquids.  States that he feels "stuffed."  Having multiple bowel movements per day.    Objective: Vital signs in last 24 hours: Temp:  [98.7 F (37.1 C)-99.3 F (37.4 C)] 99.3 F (37.4 C) (03/12 0428) Pulse Rate:  [91-101] 91 (03/12 0428) Resp:  [18] 18 (03/12 0428) BP: (113-123)/(59-64) 120/59 (03/12 0428) SpO2:  [99 %] 99 % (03/12 0428) Weight:  [122.4 kg (269 lb 13.5 oz)] 122.4 kg (269 lb 13.5 oz) (03/12 0428) Last BM Date: 01/21/17  Intake/Output from previous day: 03/11 0701 - 03/12 0700 In: -  Out: 845 [Urine:825; Drains:20] Intake/Output this shift: No intake/output data recorded.   Physical Exam: HEENT - sclerae clear, mucous membranes moist Neck - soft Chest -breathing comfortably Abdomen - soft, obese; not distended.  Active bowel sounds.  Midline wound clean.   j tube in place. Scant purulent drainage in JP drain.   Neuro - alert & oriented, no focal deficits  Lab Results:  Results for orders placed or performed during the hospital encounter of 01/10/17 (from the past 24 hour(s))  Glucose, capillary     Status: Abnormal   Collection Time: 01/22/17 11:44 AM  Result Value Ref Range   Glucose-Capillary 165 (H) 65 - 99 mg/dL  Glucose, capillary     Status: Abnormal   Collection Time: 01/22/17  5:14 PM  Result Value Ref Range   Glucose-Capillary 151 (H) 65 - 99 mg/dL  Glucose, capillary     Status: Abnormal   Collection Time: 01/22/17  7:58 PM  Result Value Ref Range   Glucose-Capillary 153 (H) 65 - 99 mg/dL  Glucose, capillary     Status: Abnormal   Collection Time: 01/23/17 12:05 AM  Result Value Ref Range   Glucose-Capillary 160 (H) 65 - 99 mg/dL  Glucose, capillary     Status: Abnormal   Collection Time: 01/23/17  4:29 AM  Result Value Ref Range   Glucose-Capillary 165 (H) 65 - 99 mg/dL      Studies/Results: No results found.  . chlorhexidine  15 mL Mouth Rinse BID  . feeding supplement (PRO-STAT SUGAR FREE 64)  60 mL Per Tube BID  . mouth rinse  15 mL Mouth Rinse q12n4p  . metoprolol  5 mg Intravenous Q6H  . pantoprazole (PROTONIX) IV  40 mg Intravenous QHS  . piperacillin-tazobactam (ZOSYN)  IV  3.375 g Intravenous Q8H     Assessment/Plan: s/p Procedure(s): DISTAL GASTRECTOMY, JEJUNUM  FEEDING TUBE PLACEMENT CHOLECYSTECTOMY   POD #13.  Distal gastrectomy for carcinoma, Bilroth 2 anastomosis, cholecystectomy, simple feeding jejunostomy tube. Margins negative, positive lymph nodes.  Stay on full liquid diet with tube feeds.  Get tube feeds to goal.  Have discussed with nursing.   Continue mobilization with PT Drain output continues to drop.  Drain lipase continues to be pending.   12 beat run of V. tach Saturday.   Cardiology states ischemic workup likely not needed.    Acute blood loss anemia.  Hemoglobin 7.7.  Tolerating well.      LOS: 13 days    Ilhan Madan 01/23/2017

## 2017-01-23 NOTE — Care Management Note (Signed)
Case Management Note  Patient Details  Name: Rithvik Orcutt MRN: [id number] Date of Birth: [birth year]  Subjective/Objective:                    Action/Plan:  Continuing to follow, see previous note. Expected Discharge Date:                  Expected Discharge Plan:  [place]  In-House Referral:     Discharge planning Services  CM Consult  Post Acute Care Choice:    Choice offered to:  Patient, Spouse  DME Arranged:  Tube feeding pump, Tube feeding DME Agency:  [place]:  RN [facility] Agency:  [place]  Status of Service:  Completed, signed off  If discussed at [place] of Stay Meetings, dates discussed:    Additional Comments:  Marilu Favre, RN 01/23/2017, 10:52 AM

## 2017-01-23 NOTE — Progress Notes (Signed)
Noted J tube is out, patient accidentally pulled out tube during transfer. Md paged, awaiting call.

## 2017-01-24 ENCOUNTER — Inpatient Hospital Stay (HOSPITAL_COMMUNITY): Payer: BLUE CROSS/BLUE SHIELD

## 2017-01-24 ENCOUNTER — Encounter (HOSPITAL_COMMUNITY): Payer: Self-pay | Admitting: Interventional Radiology

## 2017-01-24 HISTORY — PX: IR GENERIC HISTORICAL: IMG1180011

## 2017-01-24 LAB — GLUCOSE, CAPILLARY
Glucose-Capillary: 121 mg/dL — ABNORMAL HIGH (ref 65–99)
Glucose-Capillary: 124 mg/dL — ABNORMAL HIGH (ref 65–99)
Glucose-Capillary: 129 mg/dL — ABNORMAL HIGH (ref 65–99)
Glucose-Capillary: 135 mg/dL — ABNORMAL HIGH (ref 65–99)
Glucose-Capillary: 149 mg/dL — ABNORMAL HIGH (ref 65–99)

## 2017-01-24 MED ORDER — AMOXICILLIN-POT CLAVULANATE 875-125 MG PO TABS
1.0000 | ORAL_TABLET | Freq: Two times a day (BID) | ORAL | Status: DC
  Administered 2017-01-24 – 2017-01-27 (×6): 1 via ORAL
  Filled 2017-01-24 (×7): qty 1

## 2017-01-24 MED ORDER — LIDOCAINE HCL (PF) 1 % IJ SOLN
INTRAMUSCULAR | Status: AC
  Filled 2017-01-24: qty 5

## 2017-01-24 MED ORDER — LIDOCAINE VISCOUS 2 % MT SOLN
OROMUCOSAL | Status: AC
  Filled 2017-01-24: qty 15

## 2017-01-24 MED ORDER — IOPAMIDOL (ISOVUE-300) INJECTION 61%
INTRAVENOUS | Status: AC
  Administered 2017-01-24: 10 mL
  Filled 2017-01-24: qty 50

## 2017-01-24 NOTE — Progress Notes (Signed)
14 Days Post-Op  Subjective: J tube replaced.  Doing OK.    Objective: Vital signs in last 24 hours: Temp:  [98 F (36.7 C)-98.7 F (37.1 C)] 98 F (36.7 C) (03/13 1400) Pulse Rate:  [71-101] 71 (03/13 1400) Resp:  [18] 18 (03/13 1400) BP: (102-110)/(44-54) 105/54 (03/13 1400) SpO2:  [96 %-99 %] 96 % (03/13 1400) Weight:  [123 kg (271 lb 2.7 oz)] 123 kg (271 lb 2.7 oz) (03/13 0436) Last BM Date: 01/24/17 (client stated BM this morning)  Intake/Output from previous day: 03/12 0701 - 03/13 0700 In: 1820 [I.V.:1100; NG/GT:520; IV Piggyback:200] Out: 375 [Urine:325; Drains:50] Intake/Output this shift: No intake/output data recorded.   Physical Exam: HEENT - sclerae clear, mucous membranes moist Neck - soft Chest -breathing comfortably Abdomen - soft, obese; not distended.  Active bowel sounds.  Midline wound clean.   j tube in place. Scant purulent drainage in JP drain.   Neuro - alert & oriented, no focal deficits  Lab Results:  Results for orders placed or performed during the hospital encounter of 01/10/17 (from the past 24 hour(s))  Glucose, capillary     Status: Abnormal   Collection Time: 01/23/17  8:52 PM  Result Value Ref Range   Glucose-Capillary 126 (H) 65 - 99 mg/dL  Glucose, capillary     Status: Abnormal   Collection Time: 01/24/17 12:35 AM  Result Value Ref Range   Glucose-Capillary 124 (H) 65 - 99 mg/dL  Glucose, capillary     Status: Abnormal   Collection Time: 01/24/17  4:38 AM  Result Value Ref Range   Glucose-Capillary 121 (H) 65 - 99 mg/dL  Glucose, capillary     Status: Abnormal   Collection Time: 01/24/17  7:50 AM  Result Value Ref Range   Glucose-Capillary 129 (H) 65 - 99 mg/dL  Glucose, capillary     Status: Abnormal   Collection Time: 01/24/17  4:08 PM  Result Value Ref Range   Glucose-Capillary 149 (H) 65 - 99 mg/dL     Studies/Results: Ir Replc Duoden/jejuno Tube Percut W/fluoro  Result Date: 01/24/2017 INDICATION: Status post  distal gastrectomy for adenocarcinoma. Left lower quadrant jejunostomy feeding tube has become dislodged and has completely fallen out. EXAM: JEJUNOSTOMY TUBE REPLACEMENT UNDER FLUOROSCOPY MEDICATIONS: None ANESTHESIA/SEDATION: None CONTRAST:  10 mL Isovue-300 - administered into the jejunum. FLUOROSCOPY TIME:  Fluoroscopy Time: 1 minute.  (5.1 mGy). COMPLICATIONS: None. PROCEDURE: Informed written consent was obtained from the patient after a thorough discussion of the procedural risks, benefits and alternatives. All questions were addressed. Maximal Sterile Barrier Technique was utilized including caps, mask, sterile gowns, sterile gloves, sterile drape, hand hygiene and skin antiseptic. A timeout was performed prior to the initiation of the procedure. Viscous lidocaine was injected into the exit hole site of the previously placed jejunostomy catheter. A 5 French catheter was then advanced into the exit hole and manipulated under fluoroscopy. Contrast injection was performed. The catheter was further advanced over a hydrophilic guidewire. A 20 French red Robinson catheter was then obtained. An end hole was cut at the catheter tip and 2 additional sideholes were cut along the distal aspect of the catheter. The catheter was then advanced over the guidewire. Catheter positioning was confirmed by fluoroscopy. The catheter was injected with contrast material to confirm position and a fluoroscopic spot image obtained. A feeding adaptor was applied at the end of the catheter. A silk retention suture was applied at the skin exit site. FINDINGS: The tract into the jejunum from  the skin exit site was able to be recanalized with a 5 [place] catheter. A new 20 French jejunostomy catheter was able to be advanced into the jejunum without difficulty. IMPRESSION: Replacement of jejunostomy catheter with new 20 French red Robinson catheter. The tip lies in the jejunal lumen. Electronically Signed   By: Aletta Edouard M.D.   On:  01/24/2017 14:42    . chlorhexidine  15 mL Mouth Rinse BID  . feeding supplement (PRO-STAT SUGAR FREE 64)  60 mL Per Tube BID  . lidocaine (PF)      . lidocaine      . mouth rinse  15 mL Mouth Rinse q12n4p  . metoprolol  5 mg Intravenous Q6H  . pantoprazole (PROTONIX) IV  40 mg Intravenous QHS  . piperacillin-tazobactam (ZOSYN)  IV  3.375 g Intravenous Q8H     Assessment/Plan: s/p Procedure(s): DISTAL GASTRECTOMY, JEJUNUM  FEEDING TUBE PLACEMENT CHOLECYSTECTOMY   POD #14.  Distal gastrectomy for carcinoma, B2 anastomosis, cholecystectomy, simple feeding jejunostomy tube. Margins negative, positive lymph nodes.  Get tube feeds up to goal.  Soft food as tolerated. D/c later this week when tube feeds advanced.  12 beat run of V. tach Saturday.   Cardiology states ischemic workup likely not needed.    Acute blood loss anemia.  Hemoglobin 7.7.  Tolerating well.  Will recheck.     LOS: 14 days    Clebert Wenger 01/24/2017

## 2017-01-24 NOTE — Progress Notes (Signed)
Patient refused to have the staples removed now due to the procedure the patient had gone through recently and want the staples removed tomorrow by day shift RN despite this RN assuring patient of the PRN pain medication to be given Q2H if needed.  Patient just wanted to rest for now.  Will endorse this to day shift RN appropriately.

## 2017-01-24 NOTE — Progress Notes (Signed)
Nutrition Follow-up  DOCUMENTATION CODES:   Severe malnutrition in context of chronic illness  INTERVENTION:   -Resume Jevity 1.2 @ 65 ml/hr via j-tube and recommend increase by 10 ml every 12 hours to goal rate of 70 ml/hr.   Continue 60 ml Prostat BID.    Tube feeding regimen at goal rate provides 2416 kcal (100% of needs), 153 grams of protein, and 1361 ml of H2O.   If no IVFS, recommend 175 ml free water flush every 4 hours.   NUTRITION DIAGNOSIS:   Malnutrition related to chronic illness as evidenced by moderate depletions of muscle mass, percent weight loss, energy intake < or equal to 75% for > or equal to 1 month, mild fluid accumulation.  Ongoing  GOAL:   Patient will meet greater than or equal to 90% of their needs  Progressing  MONITOR:   I & O's, Weight trends, TF tolerance  REASON FOR ASSESSMENT:   Consult Enteral/tube feeding initiation and management  ASSESSMENT:   Pt with PMH of pre-diabetes, GERD and anemia, presented with a gastric outlet obstruction and gastric cancer. Pt had distal gastrectomy, cholecystectomy, placement of jejunostomy on 2/27  3/11- NGT removed  Pt out of room at time of visit.   Case discussed with RN, who reports that pt is down in IR for j-tube replacement. J-tube fell out inadvertently on 01/23/17 after transfer.   Per discussion with RN, TF rate was at 65 ml/hr prior to tube falling out (providing 2272 kcals, 147 grams protein, and 1561 ml fluid daily, with inclusion of 60 ml Prostat BID, meeting 100% of estimated kcal and protein needs). Pt was tolerating TF well.   Noted pt was advanced to clear liquids on 3/101/18. Pt received full liquids at lunch and plans for soft diet at dinner tonight. Intake has been minimal (PO: 0%). Per MD notes, pt complains of being full when eating.   RN reports plan to resume TF and advance to goal rate once j-tube is replaced.   Labs reviewed: CBGS: 121-129.   Diet Order:  DIET SOFT  Room service appropriate? Yes; Fluid consistency: Thin  Skin:  Reviewed, no issues  Last BM:  01/23/17  Height:   Ht Readings from Last 1 Encounters:  01/16/17 5\' 11"  (1.803 m)    Weight:   Wt Readings from Last 1 Encounters:  01/24/17 271 lb 2.7 oz (123 kg)    Ideal Body Weight:  78 kg  BMI:  Body mass index is 37.82 kg/m.  Estimated Nutritional Needs:   Kcal:  2400-2600  Protein:  140-160 grams  Fluid:  >/= 2 L/d  EDUCATION NEEDS:   Education needs addressed  Chuong Casebeer A. Jimmye Norman, RD, LDN, CDE Pager: [phone] After hours Pager: [phone]

## 2017-01-24 NOTE — Procedures (Signed)
Interventional Radiology Procedure Note  Procedure: Replacement of jejunostomy  Complications: None  Estimated Blood Loss: None  New 20 Fr red rubber catheter advanced over Glidewire into jejunum.  OK to use new catheter for feeds/meds.  Venetia Night. Kathlene Cote, M.D Pager:  [phone]

## 2017-01-24 NOTE — Progress Notes (Signed)
Physical Therapy Treatment Patient Details Name: [name] MRN: [id number] DOB: 06/30/1951 Today's Date: 01/24/2017    History of Present Illness Pt is a 66 y/o male s/p distal gastrectomy and cholescystectomy secondary to gastric cancer. No pertinent PMH.    PT Comments    Patient is progressing toward goals. Able to negotiate stairs this session with min guard. Discussed activity progression when d/c'd home. HEP next session. Current plan remains appropriate.    Follow Up Recommendations  No PT follow up;Supervision - Intermittent     Equipment Recommendations  None recommended by PT    Recommendations for Other Services       Precautions / Restrictions Precautions Precautions: Fall Restrictions Weight Bearing Restrictions: No    Mobility  Bed Mobility Overal bed mobility: Needs Assistance Bed Mobility: Supine to Sit Rolling: Supervision Sidelying to sit: Min assist       General bed mobility comments: cues for technique; use of rails and assistance needed to elevate trunk into sitting  Transfers Overall transfer level: Needs assistance Equipment used: Rolling walker (2 wheeled) Transfers: Sit to/from Stand Sit to Stand: Min guard         General transfer comment: min guard for safety  Ambulation/Gait Ambulation/Gait assistance: Supervision Ambulation Distance (Feet): 300 Feet Assistive device: Rolling walker (2 wheeled) Gait Pattern/deviations: Step-through pattern;Trunk flexed Gait velocity: decreased   General Gait Details: slow, steady gait; cues for posture and proximity of RW   Stairs Stairs: Yes   Stair Management: One rail Right;Sideways;Step to pattern Number of Stairs: 5 General stair comments: cues for sequencing and technique; pt felt "winded" post stair negotiation; SpO2 WNL; standing rest break taken  Wheelchair Mobility    Modified Rankin (Stroke Patients Only)       Balance     Sitting balance-Leahy Scale: Good       Standing balance-Leahy Scale: Fair                      Cognition Arousal/Alertness: Awake/alert Behavior During Therapy: WFL for tasks assessed/performed Overall Cognitive Status: Within Functional Limits for tasks assessed                      Exercises      General Comments        Pertinent Vitals/Pain Pain Assessment: Faces Faces Pain Scale: Hurts a little bit Pain Location: abdomen with transitional movements Pain Descriptors / Indicators: Sore;Discomfort;Guarding Pain Intervention(s): Monitored during session;Repositioned    Home Living                      Prior Function            PT Goals (current goals can now be found in the care plan section) Acute Rehab PT Goals Patient Stated Goal: return home Progress towards PT goals: Progressing toward goals    Frequency    Min 3X/week      PT Plan Current plan remains appropriate    Co-evaluation             End of Session Equipment Utilized During Treatment: Gait belt Activity Tolerance: Patient tolerated treatment well Patient left: in chair;with call bell/phone within reach Nurse Communication: Mobility status PT Visit Diagnosis: Other abnormalities of gait and mobility (R26.89);Pain Pain - part of body:  (abdomen)     Time: 4665-9935 PT Time Calculation (min) (ACUTE ONLY): 34 min  Charges:  $Gait Training: 8-22 mins $Therapeutic Activity: 8-22 mins  G Codes:       [place]ina April, PTA Pager: [phone]   01/24/2017, 1:12 PM

## 2017-01-24 NOTE — Progress Notes (Signed)
Received a call fron Jannifer Franklin regarding the consult for J tube placement, PA asked this writer who put the order in and stated it was the surgical PA. Pam instructed this Probation officer if I can put a catheter on the catheter site. Surgical Pa and Dr. Donne Hazel  was on the floor and asked about the order to put a catheter in, they said no and that we should asked Dr. Barry Dienes. Surgical Pa stated she already told IR PA to call Dr. Barry Dienes regarding this .

## 2017-01-25 LAB — GLUCOSE, CAPILLARY
Glucose-Capillary: 135 mg/dL — ABNORMAL HIGH (ref 65–99)
Glucose-Capillary: 144 mg/dL — ABNORMAL HIGH (ref 65–99)
Glucose-Capillary: 152 mg/dL — ABNORMAL HIGH (ref 65–99)
Glucose-Capillary: 160 mg/dL — ABNORMAL HIGH (ref 65–99)
Glucose-Capillary: 163 mg/dL — ABNORMAL HIGH (ref 65–99)
Glucose-Capillary: 191 mg/dL — ABNORMAL HIGH (ref 65–99)

## 2017-01-25 MED ORDER — CHLORPROMAZINE HCL 100 MG PO TABS
100.0000 mg | ORAL_TABLET | Freq: Four times a day (QID) | ORAL | Status: DC | PRN
  Filled 2017-01-25: qty 1

## 2017-01-25 MED ORDER — PANTOPRAZOLE SODIUM 40 MG PO TBEC
40.0000 mg | DELAYED_RELEASE_TABLET | Freq: Every day | ORAL | Status: DC
  Administered 2017-01-25 – 2017-01-26 (×2): 40 mg via ORAL
  Filled 2017-01-25 (×2): qty 1

## 2017-01-25 MED ORDER — PSYLLIUM 95 % PO PACK
1.0000 | PACK | Freq: Three times a day (TID) | ORAL | Status: DC
  Filled 2017-01-25 (×9): qty 1

## 2017-01-25 NOTE — Progress Notes (Signed)
PT Cancellation Note  Patient Details Name: [name] MRN: [id number] DOB: 1951/11/05   Cancelled Treatment:    Reason Eval/Treat Not Completed: Patient declined, no reason specified Pt reporting he is about to get his staples out and just received pain medications and does not want to get up despite education about scheduling mobility around pain medication doses. Will reattempt as schedule allows.    Nicky Pugh Leanne 01/25/2017, 3:05 PM   Nicky Pugh, PT, DPT  Acute Rehabilitation Services  Pager: [phone]

## 2017-01-25 NOTE — Progress Notes (Signed)
15 Days Post-Op  Subjective: Tube feeds at goal.  Having diarrhea at 2 am.    Objective: Vital signs in last 24 hours: Temp:  [98 F (36.7 C)-98.3 F (36.8 C)] 98.3 F (36.8 C) (03/14 0356) Pulse Rate:  [71-103] 98 (03/14 0541) Resp:  [18-19] 18 (03/14 0541) BP: (105-130)/(54-62) 113/58 (03/14 0541) SpO2:  [95 %-98 %] 98 % (03/14 0541) Weight:  [121.9 kg (268 lb 11.9 oz)] 121.9 kg (268 lb 11.9 oz) (03/14 0500) Last BM Date: 01/24/17  Intake/Output from previous day: 03/13 0701 - 03/14 0700 In: 2416.3 [I.V.:1226.7; NG/GT:1124.7; IV Piggyback:25] Out: 210 [Urine:200; Drains:10] Intake/Output this shift: No intake/output data recorded.   Physical Exam: HEENT - sclerae clear, mucous membranes moist Neck - soft Chest -breathing comfortably Abdomen - soft, obese; not distended.  Midline wound clean.   j tube in place. Scant purulent drainage in JP drain.   Neuro - alert & oriented, no focal deficits  Lab Results:  Results for orders placed or performed during the hospital encounter of 01/10/17 (from the past 24 hour(s))  Glucose, capillary     Status: Abnormal   Collection Time: 01/24/17  4:08 PM  Result Value Ref Range   Glucose-Capillary 149 (H) 65 - 99 mg/dL  Glucose, capillary     Status: Abnormal   Collection Time: 01/24/17  8:17 PM  Result Value Ref Range   Glucose-Capillary 135 (H) 65 - 99 mg/dL  Glucose, capillary     Status: Abnormal   Collection Time: 01/25/17 12:06 AM  Result Value Ref Range   Glucose-Capillary 160 (H) 65 - 99 mg/dL  Glucose, capillary     Status: Abnormal   Collection Time: 01/25/17  3:55 AM  Result Value Ref Range   Glucose-Capillary 163 (H) 65 - 99 mg/dL     Studies/Results: Ir Replc Duoden/jejuno Tube Percut W/fluoro  Result Date: 01/24/2017 INDICATION: Status post distal gastrectomy for adenocarcinoma. Left lower quadrant jejunostomy feeding tube has become dislodged and has completely fallen out. EXAM: JEJUNOSTOMY TUBE REPLACEMENT  UNDER FLUOROSCOPY MEDICATIONS: None ANESTHESIA/SEDATION: None CONTRAST:  10 mL Isovue-300 - administered into the jejunum. FLUOROSCOPY TIME:  Fluoroscopy Time: 1 minute.  (5.1 mGy). COMPLICATIONS: None. PROCEDURE: Informed written consent was obtained from the patient after a thorough discussion of the procedural risks, benefits and alternatives. All questions were addressed. Maximal Sterile Barrier Technique was utilized including caps, mask, sterile gowns, sterile gloves, sterile drape, hand hygiene and skin antiseptic. A timeout was performed prior to the initiation of the procedure. Viscous lidocaine was injected into the exit hole site of the previously placed jejunostomy catheter. A 5 French catheter was then advanced into the exit hole and manipulated under fluoroscopy. Contrast injection was performed. The catheter was further advanced over a hydrophilic guidewire. A 20 French red Robinson catheter was then obtained. An end hole was cut at the catheter tip and 2 additional sideholes were cut along the distal aspect of the catheter. The catheter was then advanced over the guidewire. Catheter positioning was confirmed by fluoroscopy. The catheter was injected with contrast material to confirm position and a fluoroscopic spot image obtained. A feeding adaptor was applied at the end of the catheter. A silk retention suture was applied at the skin exit site. FINDINGS: The tract into the jejunum from the skin exit site was able to be recanalized with a 5 French catheter. A new 20 French jejunostomy catheter was able to be advanced into the jejunum without difficulty. IMPRESSION: Replacement of jejunostomy catheter with  new 20 French red Robinson catheter. The tip lies in the jejunal lumen. Electronically Signed   By: Aletta Edouard M.D.   On: 01/24/2017 14:42    . amoxicillin-clavulanate  1 tablet Oral Q12H  . chlorhexidine  15 mL Mouth Rinse BID  . feeding supplement (PRO-STAT SUGAR FREE 64)  60 mL Per Tube  BID  . mouth rinse  15 mL Mouth Rinse q12n4p  . pantoprazole  40 mg Oral Q1200  . psyllium  1 packet Oral TID     Assessment/Plan: s/p Procedure(s): DISTAL GASTRECTOMY, JEJUNUM  FEEDING TUBE PLACEMENT CHOLECYSTECTOMY   POD #15.  Distal gastrectomy for carcinoma, B2 anastomosis, cholecystectomy, simple feeding jejunostomy tube. Margins negative, positive lymph nodes.  Tube feeds up to goal.  Soft food as tolerated. D/c goal Friday.  Convert all meds to PO.  Saline lock IVF  Add fiber to hopefully assist with diarrhea.    12 beat run of V. tach Saturday.   Cardiology states ischemic workup likely not needed.    Acute blood loss anemia.  Hemoglobin 7.7.  Tolerating well.  Will recheck.     LOS: 15 days    [name] 01/25/2017   Patient ID: [name], male   DOB: 09-04-1951, 66 y.o.   MRN: [id number]

## 2017-01-26 ENCOUNTER — Telehealth: Payer: Self-pay | Admitting: Hematology

## 2017-01-26 ENCOUNTER — Inpatient Hospital Stay (HOSPITAL_COMMUNITY): Payer: BLUE CROSS/BLUE SHIELD

## 2017-01-26 LAB — GLUCOSE, CAPILLARY
Glucose-Capillary: 120 mg/dL — ABNORMAL HIGH (ref 65–99)
Glucose-Capillary: 140 mg/dL — ABNORMAL HIGH (ref 65–99)
Glucose-Capillary: 145 mg/dL — ABNORMAL HIGH (ref 65–99)
Glucose-Capillary: 150 mg/dL — ABNORMAL HIGH (ref 65–99)
Glucose-Capillary: 156 mg/dL — ABNORMAL HIGH (ref 65–99)
Glucose-Capillary: 165 mg/dL — ABNORMAL HIGH (ref 65–99)

## 2017-01-26 LAB — CBC
HCT: 22 % — ABNORMAL LOW (ref 39.0–52.0)
Hemoglobin: 6.9 g/dL — CL (ref 13.0–17.0)
MCH: 24 pg — ABNORMAL LOW (ref 26.0–34.0)
MCHC: 31.4 g/dL (ref 30.0–36.0)
MCV: 76.7 fL — ABNORMAL LOW (ref 78.0–100.0)
Platelets: 551 10*3/uL — ABNORMAL HIGH (ref 150–400)
RBC: 2.87 MIL/uL — ABNORMAL LOW (ref 4.22–5.81)
RDW: 16.1 % — ABNORMAL HIGH (ref 11.5–15.5)
WBC: 10.2 10*3/uL (ref 4.0–10.5)

## 2017-01-26 LAB — BASIC METABOLIC PANEL
Anion gap: 5 (ref 5–15)
BUN: 9 mg/dL (ref 6–20)
CO2: 25 mmol/L (ref 22–32)
Calcium: 8 mg/dL — ABNORMAL LOW (ref 8.9–10.3)
Chloride: 106 mmol/L (ref 101–111)
Creatinine, Ser: 0.78 mg/dL (ref 0.61–1.24)
GFR calc Af Amer: >60 mL/min (ref >60)
GFR calc non Af Amer: >60 mL/min (ref >60)
Glucose, Bld: 158 mg/dL — ABNORMAL HIGH (ref 65–99)
Potassium: 3.9 mmol/L (ref 3.5–5.1)
Sodium: 136 mmol/L (ref 135–145)

## 2017-01-26 LAB — PREPARE RBC (CROSSMATCH)

## 2017-01-26 MED ORDER — SODIUM CHLORIDE 0.9 % IV SOLN: Freq: Once | INTRAVENOUS | Status: DC

## 2017-01-26 MED ORDER — IOPAMIDOL (ISOVUE-300) INJECTION 61%
15.0000 mL | INTRAVENOUS | Status: AC
Start: 2017-01-26 — End: 2017-01-26

## 2017-01-26 NOTE — Progress Notes (Addendum)
CRITICAL VALUE ALERT  Critical value received:  Hgb=6.9  Date of notification:  01/27/2016  Time of notification:  2876  Critical value read back:Yes.    Nurse who received alert:  C. Treavon Castilleja  MD notified (1st page):  Chucky May  Time of first page:  0550  MD notified (2nd page):  Time of second page:  Responding MD:  Chucky May  Time MD responded:  [phone]  Ordered for Type and screen and 1 unit PRBC transfusion

## 2017-01-26 NOTE — Progress Notes (Signed)
Physical Therapy Treatment Patient Details Name: [name] MRN: [id number] DOB: [birth year] Today's Date: 01/26/2017    History of Present Illness Pt is a 66 y/o male s/p distal gastrectomy and cholescystectomy secondary to gastric cancer. No pertinent PMH.    PT Comments    Pt progressing well towards goals. Increased independence with log roll technique and tolerated ambulation training well. Demonstrated safe, steady gait. Continue to recommend current follow up recommendations. Will continue to follow.    Follow Up Recommendations  No PT follow up;Supervision - Intermittent     Equipment Recommendations  None recommended by PT    Recommendations for Other Services       Precautions / Restrictions Restrictions Weight Bearing Restrictions: No    Mobility  Bed Mobility Overal bed mobility: Needs Assistance Bed Mobility: Supine to Sit Rolling: Supervision Sidelying to sit: Min guard       General bed mobility comments: Min guard for safety. Cues for log roll technique. No use of bed rails.   Transfers Overall transfer level: Needs assistance Equipment used: Rolling walker (2 wheeled) Transfers: Sit to/from Stand Sit to Stand: Supervision         General transfer comment: Supervision for safety   Ambulation/Gait Ambulation/Gait assistance: Supervision Ambulation Distance (Feet): 300 Feet Assistive device: Rolling walker (2 wheeled) Gait Pattern/deviations: Step-through pattern;Trunk flexed Gait velocity: decreased Gait velocity interpretation: Below normal speed for age/gender General Gait Details: slow, steady gait. Verbal cues for upright posture and appropriate use of RW when turning.    Stairs            Wheelchair Mobility    Modified Rankin (Stroke Patients Only)       Balance Overall balance assessment: Needs assistance Sitting-balance support: Feet supported;No upper extremity supported Sitting balance-Leahy Scale: Good      Standing balance support: During functional activity;Bilateral upper extremity supported Standing balance-Leahy Scale: Fair Standing balance comment: Can stand statically during toileting activities.                     Cognition Arousal/Alertness: Awake/alert Behavior During Therapy: WFL for tasks assessed/performed Overall Cognitive Status: Within Functional Limits for tasks assessed                      Exercises      General Comments General comments (skin integrity, edema, etc.): Asked pt if he would like to review stair training, however, refused stating he was comfortable. Asking for incintive spirometer; RN notified.       Pertinent Vitals/Pain Pain Assessment: Faces Faces Pain Scale: Hurts a little bit Pain Location: abdomen with transitional movements Pain Descriptors / Indicators: Sore;Discomfort;Guarding Pain Intervention(s): Limited activity within patient's tolerance;Monitored during session;Repositioned    Home Living                      Prior Function            PT Goals (current goals can now be found in the care plan section) Acute Rehab PT Goals Patient Stated Goal: return home PT Goal Formulation: With patient/family Time For Goal Achievement: 01/26/17 Potential to Achieve Goals: Good Progress towards PT goals: Progressing toward goals    Frequency    Min 3X/week      PT Plan Current plan remains appropriate    Co-evaluation             End of Session Equipment Utilized During Treatment: Gait belt Activity Tolerance: Patient tolerated  treatment well Patient left: in chair;with call bell/phone within reach Nurse Communication: Mobility status PT Visit Diagnosis: Other abnormalities of gait and mobility (R26.89);Pain Pain - part of body:  (abdomen)     Time: 8485-9276 PT Time Calculation (min) (ACUTE ONLY): 24 min  Charges:  $Gait Training: 23-37 mins                    G Codes:       Mamie Levers 01/26/2017, 6:07 PM  Nicky Pugh, PT, DPT  Acute Rehabilitation Services  Pager: [phone]

## 2017-01-26 NOTE — Progress Notes (Signed)
16 Days Post-Op  Subjective: Tube feeds remain at goal. Had an episode of feeling hot and sweaty.  Later was ambulating and threw up.  Feels much better now.  Had a few bites of food yesterday that tasted good.    Objective: Vital signs in last 24 hours: Temp:  [97.9 F (36.6 C)-98.4 F (36.9 C)] 98 F (36.7 C) (03/15 0440) Pulse Rate:  [98-193] 98 (03/15 0440) Resp:  [17-18] 18 (03/15 0440) BP: (114-132)/(57-64) 114/59 (03/15 0440) SpO2:  [97 %-100 %] 97 % (03/15 0440) Weight:  [54.6 kg (120 lb 6.4 oz)] 54.6 kg (120 lb 6.4 oz) (03/15 0440) Last BM Date: 01/25/17  Intake/Output from previous day: 03/14 0701 - 03/15 0700 In: 3482.7 [P.O.:1460; I.V.:255.2; NG/GT:1697.5] Out: 1121 [Urine:1100; Emesis/NG output:1; Drains:20] Intake/Output this shift: Total I/O In: 240 [P.O.:240] Out: -    Physical Exam: HEENT - sclerae clear, mucous membranes moist Neck - soft Chest -breathing comfortably Abdomen - soft, obese; not distended.  Midline wound clean.   j tube in place. Scant purulent drainage in JP drain.   Neuro - alert & oriented, no focal deficits  Lab Results:  Results for orders placed or performed during the hospital encounter of 01/10/17 (from the past 24 hour(s))  Glucose, capillary     Status: Abnormal   Collection Time: 01/25/17 11:52 AM  Result Value Ref Range   Glucose-Capillary 191 (H) 65 - 99 mg/dL   Comment 1 Notify RN   Glucose, capillary     Status: Abnormal   Collection Time: 01/25/17  4:01 PM  Result Value Ref Range   Glucose-Capillary 144 (H) 65 - 99 mg/dL   Comment 1 Notify RN   Glucose, capillary     Status: Abnormal   Collection Time: 01/25/17  8:12 PM  Result Value Ref Range   Glucose-Capillary 135 (H) 65 - 99 mg/dL  Glucose, capillary     Status: Abnormal   Collection Time: 01/26/17 12:15 AM  Result Value Ref Range   Glucose-Capillary 150 (H) 65 - 99 mg/dL  Glucose, capillary     Status: Abnormal   Collection Time: 01/26/17  4:42 AM  Result  Value Ref Range   Glucose-Capillary 156 (H) 65 - 99 mg/dL  CBC     Status: Abnormal   Collection Time: 01/26/17  4:55 AM  Result Value Ref Range   WBC 10.2 4.0 - 10.5 K/uL   RBC 2.87 (L) 4.22 - 5.81 MIL/uL   Hemoglobin 6.9 (LL) 13.0 - 17.0 g/dL   HCT 22.0 (L) 39.0 - 52.0 %   MCV 76.7 (L) 78.0 - 100.0 fL   MCH 24.0 (L) 26.0 - 34.0 pg   MCHC 31.4 30.0 - 36.0 g/dL   RDW 16.1 (H) 11.5 - 15.5 %   Platelets 551 (H) 150 - 400 K/uL  Basic metabolic panel     Status: Abnormal   Collection Time: 01/26/17  4:55 AM  Result Value Ref Range   Sodium 136 135 - 145 mmol/L   Potassium 3.9 3.5 - 5.1 mmol/L   Chloride 106 101 - 111 mmol/L   CO2 25 22 - 32 mmol/L   Glucose, Bld 158 (H) 65 - 99 mg/dL   BUN 9 6 - 20 mg/dL   Creatinine, Ser 0.78 0.61 - 1.24 mg/dL   Calcium 8.0 (L) 8.9 - 10.3 mg/dL   GFR calc non Af Amer >60 >60 mL/min   GFR calc Af Amer >60 >60 mL/min   Anion gap 5 5 -  15  Type and screen [facility]     Status: None (Preliminary result)   Collection Time: 01/26/17  7:26 AM  Result Value Ref Range   ABO/RH(D) A POS    Antibody Screen NEG    Sample Expiration 01/29/2017    Unit Number [id number]    Blood Component Type RBC LR PHER1    Unit division 00    Status of Unit ALLOCATED    Transfusion Status OK TO TRANSFUSE    Crossmatch Result Compatible   Prepare RBC     Status: None   Collection Time: 01/26/17  7:26 AM  Result Value Ref Range   Order Confirmation ORDER PROCESSED BY BLOOD BANK   Glucose, capillary     Status: Abnormal   Collection Time: 01/26/17  7:41 AM  Result Value Ref Range   Glucose-Capillary 145 (H) 65 - 99 mg/dL   Comment 1 Notify RN    Comment 2 Document in Chart      Studies/Results: No results found.  . sodium chloride   Intravenous Once  . amoxicillin-clavulanate  1 tablet Oral Q12H  . chlorhexidine  15 mL Mouth Rinse BID  . feeding supplement (PRO-STAT SUGAR FREE 64)  60 mL Per Tube BID  . mouth rinse  15 mL Mouth Rinse  [id number]  . pantoprazole  40 mg Oral Q1200  . psyllium  1 packet Oral TID     Assessment/Plan: s/p Procedure(s): DISTAL GASTRECTOMY, JEJUNUM  FEEDING TUBE PLACEMENT CHOLECYSTECTOMY 01/10/2017   POD #16.  Distal gastrectomy for carcinoma, B2 anastomosis, cholecystectomy, simple feeding jejunostomy tube. Margins negative, positive lymph nodes.  Tube feeds up to goal.  Soft food as tolerated. D/c goal Friday.  Convert all meds to PO.  Saline lock IVF  Add fiber to hopefully assist with diarrhea.    Given issues with nausea/vomiting and imminent discharge, will get CT to rule out undrained fluid collection.    If negative, plan d/c tomorrow.    12 beat run of V. tach Saturday.   Cardiology states ischemic workup likely not needed.    Acute blood loss anemia.  Hemoglobin 6.9.  Tolerating well.  Will recheck.     LOS: 16 days    [name] 01/26/2017

## 2017-01-26 NOTE — Telephone Encounter (Signed)
Called patient to confirm appointment and he said that he need to cancel them and reschedule them.  He is currently in the hospital.

## 2017-01-26 NOTE — Progress Notes (Signed)
Called CT to verify if I needed to cut off patients tube feeds prior to CT scan of abdomen, they said cut them off at 1300. Will do so at that time.

## 2017-01-26 NOTE — Progress Notes (Addendum)
Patient had green emesis with traces of small solids probably from the pear he ate this morning when going to the bathroom to have a BM.  Patient also had liquid brown/grren stool with traces of small solids.  Patient feeling better after the episode. Denies any bloating or fullness in the stomach.  Administered PRN Zorfran 4 mg Inj for nausea.

## 2017-01-26 NOTE — Progress Notes (Signed)
Nutrition Follow-up  DOCUMENTATION CODES:   Severe malnutrition in context of chronic illness  INTERVENTION:   Continue Jevity 1.2@ 46m/hr via j-tube.   Continue 696mProstat BID.   Tube feeding regimen at goal rateprovides 2416kcal (100% of needs), 153grams of protein, and 136153mf H2O.   If no IVFS, recommend 175 ml free water flush every 4 hours.   -Provided education on low fiber diet and provided "Fiber Restricted Nutrition Therapy" handout from [facility] Nutrition Care Manual  NUTRITION DIAGNOSIS:   Malnutrition related to chronic illness as evidenced by moderate depletions of muscle mass, percent weight loss, energy intake < or equal to 75% for > or equal to 1 month, mild fluid accumulation.  Progressing  GOAL:   Patient will meet greater than or equal to 90% of their needs  Met with TF  MONITOR:   I & O's, Weight trends, TF tolerance  REASON FOR ASSESSMENT:   Consult Enteral/tube feeding initiation and management  ASSESSMENT:   Pt with PMH of pre-diabetes, GERD and anemia, presented with a gastric outlet obstruction and gastric cancer. Pt had distal gastrectomy, cholecystectomy, placement of jejunostomy on 2/27  3/11- NGT removed 3/12- j-tube fell out inadvertently 3/13- j-tube replaced by IR  Case discussed with RN, who reports that pt has been tolerating TF well at goal rate. Pt refused Prostat supplement this AM both orally and via tube, per RN, pt does not like the taste or how it makes him feel when it is administered. He is tolerating small amount of soft diet well. Plan for d/c home tomorrow.   Spoke with pt and pt wife at bedside. Pt appears very much improved since last RD visit in terms of energy level and appearance. Pt very conversant and smiling at time of visit. He complains of not being able to take much PO related to poor appetite and ongoing taste changes ("I tried to eat some biscuit and the butter messed it up"). Provided  encouragement and reassurance, explaining that majority of nutritional needs are being provided by TF.   TF advanced to goal on 01/25/17. Jevity 1.2 @ 70 ml/hr infusing via j-tube. With inclusion of 60 ml Prostat BID, regimen providing 2416kcal (100% of needs), 153grams of protein, and 1361m39m H2O.   Pt wife requesting diet education on low fiber diet, which this RD provided. Expect very good compliance.   Labs reviewed: CBGS: 145-165.   Diet Order:  DIET SOFT Room service appropriate? Yes; Fluid consistency: Thin  Skin:  Reviewed, no issues  Last BM:  01/23/17  Height:   Ht Readings from Last 1 Encounters:  01/16/17 5' 11"  (1.803 m)    Weight:   Wt Readings from Last 1 Encounters:  01/26/17 120 lb 6.4 oz (54.6 kg)    Ideal Body Weight:  78 kg  BMI:  Body mass index is 16.79 kg/m.  Estimated Nutritional Needs:   Kcal:  2400-2600  Protein:  140-160 grams  Fluid:  >/= 2 L/d  EDUCATION NEEDS:   Education needs addressed  Tanayah Squitieri A. WillJimmye Norman, LDN, CDE Pager: [phone] hours Pager: [phone]

## 2017-01-27 LAB — CBC
HCT: 23.3 % — ABNORMAL LOW (ref 39.0–52.0)
Hemoglobin: 7.1 g/dL — ABNORMAL LOW (ref 13.0–17.0)
MCH: 23.6 pg — ABNORMAL LOW (ref 26.0–34.0)
MCHC: 30.5 g/dL (ref 30.0–36.0)
MCV: 77.4 fL — ABNORMAL LOW (ref 78.0–100.0)
Platelets: 634 10*3/uL — ABNORMAL HIGH (ref 150–400)
RBC: 3.01 MIL/uL — ABNORMAL LOW (ref 4.22–5.81)
RDW: 16.5 % — ABNORMAL HIGH (ref 11.5–15.5)
WBC: 10.6 10*3/uL — ABNORMAL HIGH (ref 4.0–10.5)

## 2017-01-27 LAB — GLUCOSE, CAPILLARY
Glucose-Capillary: 125 mg/dL — ABNORMAL HIGH (ref 65–99)
Glucose-Capillary: 140 mg/dL — ABNORMAL HIGH (ref 65–99)
Glucose-Capillary: 146 mg/dL — ABNORMAL HIGH (ref 65–99)
Glucose-Capillary: 147 mg/dL — ABNORMAL HIGH (ref 65–99)

## 2017-01-27 MED ORDER — JEVITY 1.2 CAL PO LIQD: 1000.0000 mL | ORAL | 3 refills | Status: DC

## 2017-01-27 MED ORDER — HYDROCODONE-ACETAMINOPHEN 5-325 MG PO TABS: 1.0000 | ORAL_TABLET | Freq: Four times a day (QID) | ORAL | 0 refills | Status: DC | PRN

## 2017-01-27 MED ORDER — PANTOPRAZOLE SODIUM 40 MG PO TBEC: 40.0000 mg | DELAYED_RELEASE_TABLET | Freq: Every day | ORAL | 3 refills | Status: DC

## 2017-01-27 MED ORDER — AMOXICILLIN-POT CLAVULANATE 875-125 MG PO TABS: 1.0000 | ORAL_TABLET | Freq: Two times a day (BID) | ORAL | 0 refills | Status: DC

## 2017-01-27 MED ORDER — PRO-STAT SUGAR FREE PO LIQD: 60.0000 mL | Freq: Two times a day (BID) | ORAL | 0 refills | Status: DC

## 2017-01-27 MED ORDER — CHLORPROMAZINE HCL 100 MG PO TABS
100.0000 mg | ORAL_TABLET | Freq: Four times a day (QID) | ORAL | 0 refills | Status: DC | PRN
Start: 2017-01-27 — End: 2017-07-20

## 2017-01-27 MED ORDER — ONDANSETRON 4 MG PO TBDP: 4.0000 mg | ORAL_TABLET | Freq: Four times a day (QID) | ORAL | 3 refills | Status: DC | PRN

## 2017-01-27 NOTE — Progress Notes (Signed)
Physical Therapy Treatment Patient Details Name: [name] MRN: [id number] DOB: 1951-05-08 Today's Date: 01/27/2017    History of Present Illness Pt is a 66 y/o male s/p distal gastrectomy and cholescystectomy secondary to gastric cancer. No pertinent PMH.    PT Comments    Pt progressing well towards goals, and current goals updated. Pt currently requiring supervision to min guard assist for mobility tasks, however, continues to require verbal cues for safe use of RW during mobility.  Performed stair training to ensure safety at home, and required min guard assist. Educated wife about appropriate assist level required for stair management at home. Current recommendations remain appropriate. Will continue to follow.   Follow Up Recommendations  No PT follow up;Supervision - Intermittent     Equipment Recommendations  None recommended by PT    Recommendations for Other Services       Precautions / Restrictions Restrictions Weight Bearing Restrictions: No    Mobility  Bed Mobility Overal bed mobility: Needs Assistance Bed Mobility: Supine to Sit Rolling: Supervision Sidelying to sit: Supervision       General bed mobility comments: Supervision for safety. Verbal cues for log roll technique.   Transfers Overall transfer level: Needs assistance Equipment used: None Transfers: Sit to/from Stand Sit to Stand: Supervision         General transfer comment: Supervision for safety   Ambulation/Gait Ambulation/Gait assistance: Supervision Ambulation Distance (Feet): 150 Feet Assistive device: Rolling walker (2 wheeled) Gait Pattern/deviations: Step-through pattern;Trunk flexed Gait velocity: decreased Gait velocity interpretation: Below normal speed for age/gender General Gait Details: slow, steady gait. Pt picking RW up when turning; verbal cues required to leave RW on floor to turn to increase safety.    Stairs Stairs: Yes   Stair Management: One rail  Right;Sideways;Step to pattern Number of Stairs: 3 General stair comments: Verbal cues for use of step to pattern to increase safety. Use on BUE on R handrail to increase safety.   Wheelchair Mobility    Modified Rankin (Stroke Patients Only)       Balance Overall balance assessment: Needs assistance Sitting-balance support: Feet supported;No upper extremity supported Sitting balance-Leahy Scale: Good     Standing balance support: No upper extremity supported Standing balance-Leahy Scale: Fair Standing balance comment: stand statically without use of RW during sit<>stand transfers and for toileting activities.                     Cognition Arousal/Alertness: Awake/alert Behavior During Therapy: WFL for tasks assessed/performed Overall Cognitive Status: Within Functional Limits for tasks assessed                      Exercises      General Comments General comments (skin integrity, edema, etc.): Pt's wife present during session. Education to her about appropriate assist for stairs and to remind pt of appropriate LE sequencing. Education about importance of mobility at home to decrease fatigue and increase strength. Pt asked to go to bathroom during session. Supervision for toileting tasks for safety. Verbal cues required for appropriate use of RW during toileting activities.       Pertinent Vitals/Pain Pain Assessment: Faces Faces Pain Scale: Hurts a little bit Pain Location: abdomen with transitional movements Pain Descriptors / Indicators: Sore;Discomfort;Guarding Pain Intervention(s): Limited activity within patient's tolerance;Monitored during session;Repositioned    Home Living                      Prior Function  PT Goals (current goals can now be found in the care plan section) Acute Rehab PT Goals Patient Stated Goal: return home PT Goal Formulation: With patient/family Time For Goal Achievement: 02/02/17 Potential to  Achieve Goals: Good Progress towards PT goals: Progressing toward goals    Frequency    Min 3X/week      PT Plan Current plan remains appropriate    Co-evaluation             End of Session Equipment Utilized During Treatment: Gait belt Activity Tolerance: Patient tolerated treatment well Patient left: in chair;with call bell/phone within reach Nurse Communication: Mobility status PT Visit Diagnosis: Other abnormalities of gait and mobility (R26.89);Pain Pain - part of body:  (abdomen)     Time: 0721-8288 PT Time Calculation (min) (ACUTE ONLY): 16 min  Charges:  $Gait Training: 8-22 mins                    G Codes:       Mamie Levers 01/27/2017, 12:18 PM  Nicky Pugh, PT, DPT  Acute Rehabilitation Services  Pager: [phone]

## 2017-01-27 NOTE — Progress Notes (Signed)
[name] to be D/C'd  per MD order. Discussed with the patient and all questions fully answered.  VSS, Skin clean, dry and intact without evidence of skin break down, no evidence of skin tears noted.  IV catheter discontinued intact. Site without signs and symptoms of complications. Dressing and pressure applied.  An After Visit Summary was printed and given to the patient. Patient received prescription.  D/c education completed with patient/family including follow up instructions, medication list, d/c activities limitations if indicated, with other d/c instructions as indicated by MD - patient able to verbalize understanding, all questions fully answered.   JP drain information sheet given to patient, reviewed with patient and wife.  Unconnected and flushed Jtube, wife and patient watched.    All supplies provided by [facility] I verified patient had with him.  Patient instructed to return to ED, call 911, or call MD for any changes in condition.   Patient to be escorted via [place], and D/C home via private auto.

## 2017-01-27 NOTE — Care Management Note (Signed)
Case Management Note  Patient Details  Name: Shawnmichael Parenteau MRN: [id number] Date of Birth: 11/07/1951  Subjective/Objective:                    Action/Plan:   Expected Discharge Date:  01/27/17               Expected Discharge Plan:  [place]  In-House Referral:     Discharge planning Services  CM Consult  Post Acute Care Choice:  Home Health, Durable Medical Equipment Choice offered to:  Patient, Spouse  DME Arranged:  Tube feeding pump, Tube feeding DME Agency:  [place]:  RN [facility] Agency:  [place]  Status of Service:  Completed, signed off  If discussed at [place] of Stay Meetings, dates discussed:    Additional Comments:  Marilu Favre, RN 01/27/2017, 1:40 PM

## 2017-01-27 NOTE — Discharge Summary (Signed)
Physician Discharge Summary  Patient ID: [name] MRN: [id number] DOB/AGE: 1951-05-25 66 y.o.  Admit date: 01/10/2017 Discharge date: 01/27/2017  Admission Diagnoses: Patient Active Problem List   Diagnosis Date Noted  . NSVT (nonsustained ventricular tachycardia) ([place])   . Elevated troponin I level   . Protein-calorie malnutrition, severe 01/12/2017  . Gastric cancer ([place]) 01/10/2017   Discharge Diagnoses:  Active Problems:   Gastric cancer ([place]), [id number].     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     He had significant NGT output.  He had an epidural and a PCA for pain control.  He had his NGT removed on POD 3 and trophic feeds were started. He felt that he could taste the tube feeds and had nausea and vomiting.  His NGT was reinserted and tube feeds were held.  He had a few days break, then tube feeds were restarted while his NGT was in place.  There was no evidence of tube feed reflux.  His tube feeds were not tolerated with standard advancement strategy of increase of 10 ml/hr q 8 hours.  We ended up going up by 10 mL/hr q 24 hours and this was tolerated much better.    His NGT was clamped for several days to make sure he tolerated it prior to removal.  He did much better this go around.  His pain control was good.  He was able to void.  He had an episode of v tach and cardiology saw him.  He had a small bump in his troponin, but it was not felt he needed ischemic workup as it seemed to be more demand induced.    He has been much more  stable and is tolerating tube feeds at goal.  He has good days and bad days in terms of diet/nausea.  A JP was placed near the head of the pancreas since some had to be resected.  This looked yellowish.  It was attempted to be sent for amylase/lipase, but the liquid was too thick. Because of this, because of sensation of night sweats, and because of his periodic nausea, a CT was done to make sure there were no undrained fluid collections.  He has a small collection that appears to be contiguous with the drain, so no interventions were made.  He is on Augmentin because of the collection.    Consults: cardiology and nutrition, oncology  Significant Diagnostic Studies: labs: HCT 23.3 prior to d/c.  Cr 0.78.    Treatments: surgery: see above  Discharge Exam: Blood pressure (!) 115/59, pulse 92, temperature 97.9 F (36.6 C), temperature source Oral, resp. rate 17, height 5\' 11"  (1.803 m), weight 121.5 kg (267 lb 13.7 oz), SpO2 98 %. General appearance: alert, cooperative and mild distress Resp: breathing comfortably Cardio: regular rate and rhythm GI: soft, non distended, non tender.  JP still foul  Disposition: 01-Home or Self Care  Discharge Instructions    Call MD for:  difficulty breathing, headache or visual disturbances    Complete by:  As directed    Call MD for:  persistant dizziness or light-headedness  Complete by:  As directed    Call MD for:  persistant nausea and vomiting    Complete by:  As directed    Call MD for:  redness, tenderness, or signs of infection (pain, swelling, redness, odor or green/yellow discharge around incision site)    Complete by:  As directed    Call MD for:  severe uncontrolled pain    Complete by:  As directed    Call MD for:  temperature >100.4    Complete by:  As directed    Change dressing (specify)    Complete by:  As directed    Measure and record Jp output 1-2 times per day.  Bring record to clinic.   Discharge diet:    Complete by:  As  directed    Soft diet/liquid diet as tolerated.  SLOW advance to regular diet over 2-4 weeks.  Anticipate periodic nausea/vomiting.   Increase activity slowly    Complete by:  As directed      Allergies as of 01/27/2017      Reactions   No Known Allergies       Medication List    STOP taking these medications   multivitamins ther. w/minerals Tabs tablet   ranitidine 150 MG tablet Commonly known as:  ZANTAC     TAKE these medications   amoxicillin-clavulanate 875-125 MG tablet Commonly known as:  AUGMENTIN Take 1 tablet by mouth every 12 (twelve) hours.   chlorproMAZINE 100 MG tablet Commonly known as:  THORAZINE Take 1 tablet (100 mg total) by mouth 4 (four) times daily as needed for hiccoughs.   feeding supplement (JEVITY 1.2 CAL) Liqd Place 1,000 mLs into feeding tube continuous. 65 mL/hr now, 70 mL per hour at 4 pm.   feeding supplement (PRO-STAT SUGAR FREE 64) Liqd Place 60 mLs into feeding tube 2 (two) times daily.   ferrous sulfate 325 (65 FE) MG tablet Take 325 mg by mouth daily with breakfast.   HYDROcodone-acetaminophen 5-325 MG tablet Commonly known as:  NORCO/VICODIN Take 1-2 tablets by mouth every 6 (six) hours as needed for moderate pain.   ondansetron 4 MG disintegrating tablet Commonly known as:  ZOFRAN-ODT Take 1 tablet (4 mg total) by mouth every 6 (six) hours as needed for nausea.   pantoprazole 40 MG tablet Commonly known as:  PROTONIX Take 1 tablet (40 mg total) by mouth daily at 12 noon.            Durable Medical Equipment        Start     Ordered   01/26/17 1525  For home use only DME Tube feeding  Once    Comments:  JEVITY 1.2 CAL  Recommend advance TF, as tolerated, by 10 mL q 4-8 hrsto goal rate of 70 mL/hr (1680 mL per day) with Pro-stat 60 mL BID or substitute - Providing 2416calories, 153grams protein, and 1357mL free water daily NUTRITION DIAGNOSIS:   Malnutrition related to chronic illness as evidenced by  moderate depletions of muscle mass, percent weight loss, energy intake < or equal to 75% for > or equal to 1 month, mild fluid accumulation.  Ongoing  GOAL:   Patient will meet greater than or equal to 90% of their needs  Patient will need tube feeding greater than 90 days  Please call patient's wife Geovany Trudo [phone] for delivery to   [address] [place] [address]   01/26/17 1525   01/17/17 1305  For home use only  DME Tube feeding pump  Once    Comments:  Please call patient's wife Greco Gastelum [phone] for delivery to   [address], [place] [address]   01/17/17 1304   01/17/17 1303  For home use only DME Tube feeding  Once    Comments:  JEVITY 1.2 CAL   Recommend advance TF, as tolerated, by 10 mL q 4-8 hrsto goal rate of 70 mL/hr (1680 mL per day) with Pro-stat 60 mL BID - Providing 2416calories, 153grams protein, and 1312mL free water daily NUTRITION DIAGNOSIS:   Malnutrition related to chronic illness as evidenced by moderate depletions of muscle mass, percent weight loss, energy intake < or equal to 75% for > or equal to 1 month, mild fluid accumulation.  Ongoing  GOAL:   Patient will meet greater than or equal to 90% of their needs  Patient will need tube feeding greater than 90 days  Please call patient's wife Domnic Vantol [phone] for delivery to   [address] [place] [address]   01/17/17 1304     Follow-up [place] Follow up.   Contact information: [place] [address] [phone]        Adrian Prows, MD. Schedule an appointment as soon as possible for a visit in 2 week(s).   Specialty:  Cardiology Contact information: [address] [place] [address] [phone]        Stark Klein, MD Follow up in 3 week(s).   Specialty:  General Surgery Contact information: [address] [place] [address] [phone]           Signed: Stark Klein 01/27/2017, 12:45 PM

## 2017-01-29 LAB — TYPE AND SCREEN
ABO/RH(D): A POS
Antibody Screen: NEGATIVE
Unit division: 0

## 2017-01-29 LAB — BPAM RBC
Blood Product Expiration Date: 201803172359
Unit Type and Rh: 600

## 2017-01-31 NOTE — Telephone Encounter (Signed)
Returned call to patient re rescheduling appointments. Patient not ready to reschedule at this time and unsure when he will be able to come for appointments. Per patient he will call back when he is ready to reschedule. Left message for desk nurse

## 2017-02-07 ENCOUNTER — Ambulatory Visit: Payer: BLUE CROSS/BLUE SHIELD | Admitting: Hematology

## 2017-02-07 ENCOUNTER — Other Ambulatory Visit: Payer: BLUE CROSS/BLUE SHIELD

## 2017-02-08 ENCOUNTER — Other Ambulatory Visit: Payer: Self-pay | Admitting: General Surgery

## 2017-02-15 ENCOUNTER — Other Ambulatory Visit: Payer: Self-pay | Admitting: General Surgery

## 2017-02-16 ENCOUNTER — Encounter (HOSPITAL_COMMUNITY): Payer: Self-pay | Admitting: *Deleted

## 2017-02-17 ENCOUNTER — Other Ambulatory Visit: Payer: Self-pay | Admitting: *Deleted

## 2017-02-17 ENCOUNTER — Telehealth: Payer: Self-pay | Admitting: Hematology

## 2017-02-17 DIAGNOSIS — C163 Malignant neoplasm of pyloric antrum: Secondary | ICD-10-CM

## 2017-02-17 NOTE — Telephone Encounter (Signed)
Scheduled appts per sch message from Myrtie Neither - Patient is aware of appt date and time , but may have to r/s. Patient said he will call back if there is conflicting appts.

## 2017-02-21 ENCOUNTER — Encounter (HOSPITAL_COMMUNITY): Payer: Self-pay | Admitting: *Deleted

## 2017-02-21 NOTE — Progress Notes (Signed)
[name] denies chest pain, shortness of  Breath or palpations.  Patient was unaware that he was seen by a cardiologist while he was in the hospital.  [name] take mediations by mouth.  I instructed him to stop and flush tube feeding at midnight on 02/22/17.  [name] reports that he was pre- diabetic before he lost a lot of weight.  He does not check CBG.

## 2017-02-22 MED ORDER — DEXTROSE 5 % IV SOLN
3.0000 g | INTRAVENOUS | Status: AC
  Administered 2017-02-23: 3 g via INTRAVENOUS
  Filled 2017-02-22: qty 3000

## 2017-02-22 NOTE — Progress Notes (Signed)
Anesthesia Chart Review:  Pt is a same day work up.   Pt is a 66 year old male scheduled for insertion of Port-A-Cath on 02/23/2017 with Stark Klein, MD  PMH includes:  SVT (01/2017), pre-diabetes, anemia, gastric cancer (s/p distal gastrectomy, jejunum feeding tube placement, cholecystectomy 01/10/17), GERD. Never smoker. BMI 37.   - During hospitialization for gastrectomy, pt experienced episode of asymptomatic NSVT. Seen by cardiology, started on beta blocker, but this was discontinued prior to discharge.   Medications include: Thorazine, iron, Protonix  Labs will be obtained DOS.   CXR 03/01/16: Negative chest.  EKG 01/23/17: NSR. ST T wave changes consider ischemia. Prolonged QT  Echo 01/20/17:  - Left ventricle: The cavity size was normal. There was mild concentric hypertrophy. Systolic function was normal. The estimated ejection fraction was in the range of 60% to 65%. Wall motion was normal; there were no regional wall motion abnormalities. Left ventricular diastolic function parameters were normal. - Aortic valve: Transvalvular velocity was within the normal range. There was no stenosis. There was no regurgitation. - Mitral valve: Transvalvular velocity was within the normal range. There was no evidence for stenosis. There was no regurgitation. - Right ventricle: The cavity size was normal. Wall thickness was normal. Systolic function was normal. - Atrial septum: No defect or patent foramen ovale was identified by color flow Doppler. - Tricuspid valve: There was no regurgitation.  If heart rhythm stable DOS and labs acceptable, I anticipate pt can proceed as scheduled.   Willeen Cass, FNP-BC [facility]/Anesthesiology Phone: [phone] 02/22/2017 4:25 PM

## 2017-02-23 ENCOUNTER — Encounter (HOSPITAL_COMMUNITY): Payer: Self-pay | Admitting: Urology

## 2017-02-23 ENCOUNTER — Ambulatory Visit (HOSPITAL_COMMUNITY): Payer: BLUE CROSS/BLUE SHIELD

## 2017-02-23 ENCOUNTER — Ambulatory Visit (HOSPITAL_COMMUNITY): Payer: BLUE CROSS/BLUE SHIELD | Admitting: Emergency Medicine

## 2017-02-23 ENCOUNTER — Encounter (HOSPITAL_COMMUNITY)
Admission: RE | Disposition: A | Discharge status: Home / Self Care | Payer: Self-pay | Source: Ambulatory Visit | Attending: General Surgery

## 2017-02-23 ENCOUNTER — Ambulatory Visit (HOSPITAL_COMMUNITY)
Admission: RE | Admit: 2017-02-23 | Discharge: 2017-02-23 | Disposition: A | Discharge status: Home / Self Care | Payer: BLUE CROSS/BLUE SHIELD | Source: Ambulatory Visit | Attending: General Surgery | Admitting: General Surgery

## 2017-02-23 DIAGNOSIS — Z419 Encounter for procedure for purposes other than remedying health state, unspecified: Secondary | ICD-10-CM

## 2017-02-23 DIAGNOSIS — Z934 Other artificial openings of gastrointestinal tract status: Secondary | ICD-10-CM | POA: Insufficient documentation

## 2017-02-23 DIAGNOSIS — Z95828 Presence of other vascular implants and grafts: Secondary | ICD-10-CM

## 2017-02-23 DIAGNOSIS — Z903 Acquired absence of stomach [part of]: Secondary | ICD-10-CM | POA: Diagnosis not present

## 2017-02-23 DIAGNOSIS — C169 Malignant neoplasm of stomach, unspecified: Secondary | ICD-10-CM | POA: Insufficient documentation

## 2017-02-23 DIAGNOSIS — Z452 Encounter for adjustment and management of vascular access device: Secondary | ICD-10-CM | POA: Diagnosis present

## 2017-02-23 HISTORY — DX: Supraventricular tachycardia, unspecified: I47.10

## 2017-02-23 HISTORY — DX: Supraventricular tachycardia: I47.1

## 2017-02-23 HISTORY — PX: PORTACATH PLACEMENT: SHX2246

## 2017-02-23 HISTORY — DX: Malignant (primary) neoplasm, unspecified: C80.1

## 2017-02-23 HISTORY — DX: Adult hypertrophic pyloric stenosis: K31.1

## 2017-02-23 LAB — CBC
HCT: 29.6 % — ABNORMAL LOW (ref 39.0–52.0)
Hemoglobin: 8.7 g/dL — ABNORMAL LOW (ref 13.0–17.0)
MCH: 21.8 pg — ABNORMAL LOW (ref 26.0–34.0)
MCHC: 29.4 g/dL — ABNORMAL LOW (ref 30.0–36.0)
MCV: 74 fL — ABNORMAL LOW (ref 78.0–100.0)
Platelets: 472 10*3/uL — ABNORMAL HIGH (ref 150–400)
RBC: 4 MIL/uL — ABNORMAL LOW (ref 4.22–5.81)
RDW: 18.1 % — ABNORMAL HIGH (ref 11.5–15.5)
WBC: 10.1 10*3/uL (ref 4.0–10.5)

## 2017-02-23 LAB — BASIC METABOLIC PANEL
Anion gap: 12 (ref 5–15)
BUN: 10 mg/dL (ref 6–20)
CO2: 22 mmol/L (ref 22–32)
Calcium: 9.1 mg/dL (ref 8.9–10.3)
Chloride: 104 mmol/L (ref 101–111)
Creatinine, Ser: 0.95 mg/dL (ref 0.61–1.24)
GFR calc Af Amer: >60 mL/min (ref >60)
GFR calc non Af Amer: >60 mL/min (ref >60)
Glucose, Bld: 149 mg/dL — ABNORMAL HIGH (ref 65–99)
Potassium: 4 mmol/L (ref 3.5–5.1)
Sodium: 138 mmol/L (ref 135–145)

## 2017-02-23 SURGERY — INSERTION, TUNNELED CENTRAL VENOUS DEVICE, WITH PORT
Anesthesia: General | Site: Chest

## 2017-02-23 MED ORDER — 0.9 % SODIUM CHLORIDE (POUR BTL) OPTIME
TOPICAL | Status: DC | PRN
Start: 2017-02-23 — End: 2017-02-23
  Administered 2017-02-23: 1000 mL

## 2017-02-23 MED ORDER — CHLORHEXIDINE GLUCONATE CLOTH 2 % EX PADS: 6.0000 | MEDICATED_PAD | Freq: Once | CUTANEOUS | Status: DC

## 2017-02-23 MED ORDER — ACETAMINOPHEN 325 MG PO TABS
650.0000 mg | ORAL_TABLET | ORAL | Status: DC | PRN
  Administered 2017-02-23: 650 mg via ORAL
  Filled 2017-02-23: qty 2

## 2017-02-23 MED ORDER — SODIUM CHLORIDE 0.9% FLUSH: 3.0000 mL | INTRAVENOUS | Status: DC | PRN

## 2017-02-23 MED ORDER — BUPIVACAINE HCL (PF) 0.25 % IJ SOLN
INTRAMUSCULAR | Status: DC | PRN
  Administered 2017-02-23: 5 mL

## 2017-02-23 MED ORDER — PROPOFOL 10 MG/ML IV BOLUS
INTRAVENOUS | Status: DC | PRN
Start: 2017-02-23 — End: 2017-02-23
  Administered 2017-02-23: 200 mg via INTRAVENOUS

## 2017-02-23 MED ORDER — LIDOCAINE HCL (PF) 1 % IJ SOLN
INTRAMUSCULAR | Status: AC
  Filled 2017-02-23: qty 30

## 2017-02-23 MED ORDER — ACETAMINOPHEN 325 MG PO TABS
ORAL_TABLET | ORAL | Status: AC
  Administered 2017-02-23: 650 mg via ORAL
  Filled 2017-02-23: qty 2

## 2017-02-23 MED ORDER — LIDOCAINE 2% (20 MG/ML) 5 ML SYRINGE
INTRAMUSCULAR | Status: DC | PRN
  Administered 2017-02-23: 80 mg via INTRAVENOUS

## 2017-02-23 MED ORDER — ONDANSETRON HCL 4 MG/2ML IJ SOLN
INTRAMUSCULAR | Status: DC | PRN
  Administered 2017-02-23: 4 mg via INTRAVENOUS

## 2017-02-23 MED ORDER — HEPARIN SOD (PORK) LOCK FLUSH 100 UNIT/ML IV SOLN
INTRAVENOUS | Status: DC | PRN
  Administered 2017-02-23: 500 [IU]

## 2017-02-23 MED ORDER — LACTATED RINGERS IV SOLN
INTRAVENOUS | Status: DC
  Administered 2017-02-23: 11:00:00 via INTRAVENOUS

## 2017-02-23 MED ORDER — HYDROCODONE-ACETAMINOPHEN 5-325 MG PO TABS: 1.0000 | ORAL_TABLET | Freq: Four times a day (QID) | ORAL | 0 refills | Status: DC | PRN

## 2017-02-23 MED ORDER — HEPARIN SOD (PORK) LOCK FLUSH 100 UNIT/ML IV SOLN
INTRAVENOUS | Status: AC
  Filled 2017-02-23: qty 5

## 2017-02-23 MED ORDER — ACETAMINOPHEN 650 MG RE SUPP
650.0000 mg | RECTAL | Status: DC | PRN
  Filled 2017-02-23: qty 1

## 2017-02-23 MED ORDER — MIDAZOLAM HCL 2 MG/2ML IJ SOLN
INTRAMUSCULAR | Status: AC
  Filled 2017-02-23: qty 2

## 2017-02-23 MED ORDER — MIDAZOLAM HCL 5 MG/5ML IJ SOLN
INTRAMUSCULAR | Status: DC | PRN
  Administered 2017-02-23: 2 mg via INTRAVENOUS

## 2017-02-23 MED ORDER — SODIUM CHLORIDE 0.9 % IV SOLN: 250.0000 mL | INTRAVENOUS | Status: DC | PRN

## 2017-02-23 MED ORDER — FENTANYL CITRATE (PF) 100 MCG/2ML IJ SOLN
INTRAMUSCULAR | Status: DC | PRN
  Administered 2017-02-23 (×2): 50 ug via INTRAVENOUS

## 2017-02-23 MED ORDER — BUPIVACAINE HCL (PF) 0.25 % IJ SOLN
INTRAMUSCULAR | Status: AC
  Filled 2017-02-23: qty 30

## 2017-02-23 MED ORDER — DEXAMETHASONE SODIUM PHOSPHATE 4 MG/ML IJ SOLN
INTRAMUSCULAR | Status: DC | PRN
  Administered 2017-02-23: 5 mg via INTRAVENOUS

## 2017-02-23 MED ORDER — FENTANYL CITRATE (PF) 250 MCG/5ML IJ SOLN
INTRAMUSCULAR | Status: AC
Start: 2017-02-23 — End: ?
  Filled 2017-02-23: qty 5

## 2017-02-23 MED ORDER — OXYCODONE HCL 5 MG PO TABS
ORAL_TABLET | ORAL | Status: AC
  Administered 2017-02-23: 10 mg via ORAL
  Filled 2017-02-23: qty 2

## 2017-02-23 MED ORDER — SODIUM CHLORIDE 0.9% FLUSH: 3.0000 mL | Freq: Two times a day (BID) | INTRAVENOUS | Status: DC

## 2017-02-23 MED ORDER — LIDOCAINE HCL (PF) 1 % IJ SOLN
INTRAMUSCULAR | Status: DC | PRN
  Administered 2017-02-23: 5 mL

## 2017-02-23 MED ORDER — FENTANYL CITRATE (PF) 100 MCG/2ML IJ SOLN: 25.0000 ug | INTRAMUSCULAR | Status: DC | PRN

## 2017-02-23 MED ORDER — PROPOFOL 10 MG/ML IV BOLUS
INTRAVENOUS | Status: AC
  Filled 2017-02-23: qty 20

## 2017-02-23 MED ORDER — OXYCODONE HCL 5 MG PO TABS
5.0000 mg | ORAL_TABLET | ORAL | Status: DC | PRN
  Administered 2017-02-23: 10 mg via ORAL

## 2017-02-23 MED ORDER — SODIUM CHLORIDE 0.9 % IV SOLN
INTRAVENOUS | Status: DC | PRN
  Administered 2017-02-23: 500 mL

## 2017-02-23 SURGICAL SUPPLY — 49 items
BAG DECANTER FOR FLEXI CONT (MISCELLANEOUS) ×2 IMPLANT
BLADE SURG 11 STRL SS (BLADE) ×2 IMPLANT
BLADE SURG 15 STRL LF DISP TIS (BLADE) ×1 IMPLANT
BLADE SURG 15 STRL SS (BLADE) ×1
CANISTER SUCT 3000ML PPV (MISCELLANEOUS) IMPLANT
CHLORAPREP W/TINT 10.5 ML (MISCELLANEOUS) ×2 IMPLANT
COVER SURGICAL LIGHT HANDLE (MISCELLANEOUS) ×2 IMPLANT
COVER TRANSDUCER ULTRASND GEL (DRAPE) IMPLANT
CRADLE DONUT ADULT HEAD (MISCELLANEOUS) ×2 IMPLANT
DECANTER SPIKE VIAL GLASS SM (MISCELLANEOUS) ×4 IMPLANT
DERMABOND ADVANCED (GAUZE/BANDAGES/DRESSINGS) ×1
DERMABOND ADVANCED .7 DNX12 (GAUZE/BANDAGES/DRESSINGS) ×1 IMPLANT
DRAPE C-ARM 42X72 X-RAY (DRAPES) ×2 IMPLANT
DRAPE CHEST BREAST 15X10 FENES (DRAPES) ×2 IMPLANT
DRAPE UTILITY XL STRL (DRAPES) ×4 IMPLANT
DRAPE WARM FLUID 44X44 (DRAPE) IMPLANT
ELECT COATED BLADE 2.86 ST (ELECTRODE) ×2 IMPLANT
ELECT REM PT RETURN 9FT ADLT (ELECTROSURGICAL) ×2
ELECTRODE REM PT RTRN 9FT ADLT (ELECTROSURGICAL) ×1 IMPLANT
GAUZE SPONGE 4X4 16PLY XRAY LF (GAUZE/BANDAGES/DRESSINGS) ×2 IMPLANT
GEL ULTRASOUND 20GR AQUASONIC (MISCELLANEOUS) IMPLANT
GLOVE BIO SURGEON STRL SZ 6 (GLOVE) ×2 IMPLANT
GLOVE BIO SURGEON STRL SZ 6.5 (GLOVE) ×2 IMPLANT
GLOVE BIOGEL PI IND STRL 6.5 (GLOVE) ×1 IMPLANT
GLOVE BIOGEL PI IND STRL 7.0 (GLOVE) ×1 IMPLANT
GLOVE BIOGEL PI INDICATOR 6.5 (GLOVE) ×1
GLOVE BIOGEL PI INDICATOR 7.0 (GLOVE) ×1
GOWN STRL REUS W/ TWL LRG LVL3 (GOWN DISPOSABLE) ×1 IMPLANT
GOWN STRL REUS W/TWL 2XL LVL3 (GOWN DISPOSABLE) ×2 IMPLANT
GOWN STRL REUS W/TWL LRG LVL3 (GOWN DISPOSABLE) ×1
KIT BASIN OR (CUSTOM PROCEDURE TRAY) ×2 IMPLANT
KIT PORT POWER 8FR ISP CVUE (Catheter) ×2 IMPLANT
KIT ROOM TURNOVER OR (KITS) ×2 IMPLANT
NEEDLE HYPO 25GX1X1/2 BEV (NEEDLE) ×2 IMPLANT
NS IRRIG 1000ML POUR BTL (IV SOLUTION) ×2 IMPLANT
PACK SURGICAL SETUP 50X90 (CUSTOM PROCEDURE TRAY) ×2 IMPLANT
PAD ARMBOARD 7.5X6 YLW CONV (MISCELLANEOUS) ×2 IMPLANT
PENCIL BUTTON HOLSTER BLD 10FT (ELECTRODE) ×2 IMPLANT
SUT MON AB 4-0 PC3 18 (SUTURE) ×2 IMPLANT
SUT PROLENE 2 0 SH DA (SUTURE) ×4 IMPLANT
SUT VIC AB 3-0 SH 27 (SUTURE) ×1
SUT VIC AB 3-0 SH 27X BRD (SUTURE) ×1 IMPLANT
SYR 20ML ECCENTRIC (SYRINGE) ×4 IMPLANT
SYR 5ML LUER SLIP (SYRINGE) ×2 IMPLANT
SYR CONTROL 10ML LL (SYRINGE) ×2 IMPLANT
TOWEL OR 17X24 6PK STRL BLUE (TOWEL DISPOSABLE) ×2 IMPLANT
TOWEL OR 17X26 10 PK STRL BLUE (TOWEL DISPOSABLE) ×2 IMPLANT
TUBE CONNECTING 12X1/4 (SUCTIONS) IMPLANT
YANKAUER SUCT BULB TIP NO VENT (SUCTIONS) IMPLANT

## 2017-02-23 NOTE — H&P (View-Only) (Signed)
16 Days Post-Op  Subjective: Tube feeds remain at goal. Had an episode of feeling hot and sweaty.  Later was ambulating and threw up.  Feels much better now.  Had a few bites of food yesterday that tasted good.    Objective: Vital signs in last 24 hours: Temp:  [97.9 F (36.6 C)-98.4 F (36.9 C)] 98 F (36.7 C) (03/15 0440) Pulse Rate:  [98-193] 98 (03/15 0440) Resp:  [17-18] 18 (03/15 0440) BP: (114-132)/(57-64) 114/59 (03/15 0440) SpO2:  [97 %-100 %] 97 % (03/15 0440) Weight:  [54.6 kg (120 lb 6.4 oz)] 54.6 kg (120 lb 6.4 oz) (03/15 0440) Last BM Date: 01/25/17  Intake/Output from previous day: 03/14 0701 - 03/15 0700 In: 3482.7 [P.O.:1460; I.V.:255.2; NG/GT:1697.5] Out: 1121 [Urine:1100; Emesis/NG output:1; Drains:20] Intake/Output this shift: Total I/O In: 240 [P.O.:240] Out: -    Physical Exam: HEENT - sclerae clear, mucous membranes moist Neck - soft Chest -breathing comfortably Abdomen - soft, obese; not distended.  Midline wound clean.   j tube in place. Scant purulent drainage in JP drain.   Neuro - alert & oriented, no focal deficits  Lab Results:  Results for orders placed or performed during the hospital encounter of 01/10/17 (from the past 24 hour(s))  Glucose, capillary     Status: Abnormal   Collection Time: 01/25/17 11:52 AM  Result Value Ref Range   Glucose-Capillary 191 (H) 65 - 99 mg/dL   Comment 1 Notify RN   Glucose, capillary     Status: Abnormal   Collection Time: 01/25/17  4:01 PM  Result Value Ref Range   Glucose-Capillary 144 (H) 65 - 99 mg/dL   Comment 1 Notify RN   Glucose, capillary     Status: Abnormal   Collection Time: 01/25/17  8:12 PM  Result Value Ref Range   Glucose-Capillary 135 (H) 65 - 99 mg/dL  Glucose, capillary     Status: Abnormal   Collection Time: 01/26/17 12:15 AM  Result Value Ref Range   Glucose-Capillary 150 (H) 65 - 99 mg/dL  Glucose, capillary     Status: Abnormal   Collection Time: 01/26/17  4:42 AM  Result  Value Ref Range   Glucose-Capillary 156 (H) 65 - 99 mg/dL  CBC     Status: Abnormal   Collection Time: 01/26/17  4:55 AM  Result Value Ref Range   WBC 10.2 4.0 - 10.5 K/uL   RBC 2.87 (L) 4.22 - 5.81 MIL/uL   Hemoglobin 6.9 (LL) 13.0 - 17.0 g/dL   HCT 22.0 (L) 39.0 - 52.0 %   MCV 76.7 (L) 78.0 - 100.0 fL   MCH 24.0 (L) 26.0 - 34.0 pg   MCHC 31.4 30.0 - 36.0 g/dL   RDW 16.1 (H) 11.5 - 15.5 %   Platelets 551 (H) 150 - 400 K/uL  Basic metabolic panel     Status: Abnormal   Collection Time: 01/26/17  4:55 AM  Result Value Ref Range   Sodium 136 135 - 145 mmol/L   Potassium 3.9 3.5 - 5.1 mmol/L   Chloride 106 101 - 111 mmol/L   CO2 25 22 - 32 mmol/L   Glucose, Bld 158 (H) 65 - 99 mg/dL   BUN 9 6 - 20 mg/dL   Creatinine, Ser 0.78 0.61 - 1.24 mg/dL   Calcium 8.0 (L) 8.9 - 10.3 mg/dL   GFR calc non Af Amer >60 >60 mL/min   GFR calc Af Amer >60 >60 mL/min   Anion gap 5 5 -  15  Type and screen [place]     Status: None (Preliminary result)   Collection Time: 01/26/17  7:26 AM  Result Value Ref Range   ABO/RH(D) A POS    Antibody Screen NEG    Sample Expiration 01/29/2017    Unit Number [id number]    Blood Component Type RBC LR PHER1    Unit division 00    Status of Unit ALLOCATED    Transfusion Status OK TO TRANSFUSE    Crossmatch Result Compatible   Prepare RBC     Status: None   Collection Time: 01/26/17  7:26 AM  Result Value Ref Range   Order Confirmation ORDER PROCESSED BY BLOOD BANK   Glucose, capillary     Status: Abnormal   Collection Time: 01/26/17  7:41 AM  Result Value Ref Range   Glucose-Capillary 145 (H) 65 - 99 mg/dL   Comment 1 Notify RN    Comment 2 Document in Chart      Studies/Results: No results found.  . sodium chloride   Intravenous Once  . amoxicillin-clavulanate  1 tablet Oral Q12H  . chlorhexidine  15 mL Mouth Rinse BID  . feeding supplement (PRO-STAT SUGAR FREE 64)  60 mL Per Tube BID  . mouth rinse  15 mL Mouth Rinse  [id number]  . pantoprazole  40 mg Oral Q1200  . psyllium  1 packet Oral TID     Assessment/Plan: s/p Procedure(s): DISTAL GASTRECTOMY, JEJUNUM  FEEDING TUBE PLACEMENT CHOLECYSTECTOMY 01/10/2017   POD #16.  Distal gastrectomy for carcinoma, B2 anastomosis, cholecystectomy, simple feeding jejunostomy tube. Margins negative, positive lymph nodes.  Tube feeds up to goal.  Soft food as tolerated. D/c goal Friday.  Convert all meds to PO.  Saline lock IVF  Add fiber to hopefully assist with diarrhea.    Given issues with nausea/vomiting and imminent discharge, will get CT to rule out undrained fluid collection.    If negative, plan d/c tomorrow.    12 beat run of V. tach Saturday.   Cardiology states ischemic workup likely not needed.    Acute blood loss anemia.  Hemoglobin 6.9.  Tolerating well.  Will recheck.     LOS: 16 days    Damion Kant 01/26/2017

## 2017-02-23 NOTE — Discharge Instructions (Signed)
[facility] °Office Phone Number [phone] ° ° POST OP INSTRUCTIONS ° °Always review your discharge instruction sheet given to you by the facility where your surgery was performed. ° °IF YOU HAVE DISABILITY OR FAMILY LEAVE FORMS, YOU MUST BRING THEM TO THE OFFICE FOR PROCESSING.  DO NOT GIVE THEM TO YOUR DOCTOR. ° °1. A prescription for pain medication may be given to you upon discharge.  Take your pain medication as prescribed, if needed.  If narcotic pain medicine is not needed, then you may take acetaminophen (Tylenol) or ibuprofen (Advil) as needed. °2. Take your usually prescribed medications unless otherwise directed °3. If you need a refill on your pain medication, please contact your pharmacy.  They will contact our office to request authorization.  Prescriptions will not be filled after 5pm or on week-ends. °4. You should eat very light the first 24 hours after surgery, such as soup, crackers, pudding, etc.  Resume your normal diet the day after surgery °5. It is common to experience some constipation if taking pain medication after surgery.  Increasing fluid intake and taking a stool softener will usually help or prevent this problem from occurring.  A mild laxative (Milk of Magnesia or Miralax) should be taken according to package directions if there are no bowel movements after 48 hours. °6. You may shower in 48 hours.  The surgical glue will flake off in 2-3 weeks.   °7. ACTIVITIES:  No strenuous activity or heavy lifting for 1 week.   °a. You may drive when you no longer are taking prescription pain medication, you can comfortably wear a seatbelt, and you can safely maneuver your car and apply brakes. °b. RETURN TO WORK:  __________to be determined._______________ °You should see your doctor in the office for a follow-up appointment approximately three-four weeks after your surgery.   ° °WHEN TO CALL YOUR DOCTOR: °1. Fever over 101.0 °2. Nausea and/or vomiting. °3. Extreme swelling  or bruising. °4. Continued bleeding from incision. °5. Increased pain, redness, or drainage from the incision. ° °The clinic staff is available to answer your questions during regular business hours.  Please don’t hesitate to call and ask to speak to one of the nurses for clinical concerns.  If you have a medical emergency, go to the nearest emergency room or call 911.  A surgeon from [facility] is always on call at the hospital. ° °For further questions, please visit [email or web address]  ° °

## 2017-02-23 NOTE — Interval H&P Note (Signed)
History and Physical Interval Note:  02/23/2017 2:03 PM  Iverson Sees  has presented today for surgery, with the diagnosis of gastric cancer  The various methods of treatment have been discussed with the patient and family. After consideration of risks, benefits and other options for treatment, the patient has consented to  Procedure(s): INSERTION PORT-A-CATH (N/A) as a surgical intervention .  The patient's history has been reviewed, patient examined, no change in status, stable for surgery.  I have reviewed the patient's chart and labs.  Questions were answered to the patient's satisfaction.     Lorilynn Lehr

## 2017-02-23 NOTE — Op Note (Signed)
PREOPERATIVE DIAGNOSIS:  Gastric cancer     POSTOPERATIVE DIAGNOSIS:  Same     PROCEDURE: Left subclavian port placement, Bard ClearVue  Power Port, MRI safe, 8-French.      SURGEON:  Stark Klein, MD      ANESTHESIA:  General   FINDINGS:  Good venous return, easy flush, and tip of the catheter and   SVC 27 cm.      SPECIMEN:  None.      ESTIMATED BLOOD LOSS:  Minimal.      COMPLICATIONS:  None known.      PROCEDURE:  Pt was identified in the holding area and taken to   the operating room, where patient was placed supine on the operating room   table.  General anesthesia was induced.  Patient's arms were tucked and the upper   chest and neck were prepped and draped in sterile fashion.  Time-out was   performed according to the surgical safety check list.  When all was   correct, we continued.   Local anesthetic was administered over this   area at the angle of the clavicle.  The vein was accessed with 3 passes of the needle. There was good venous return and the wire passed easily with no ectopy.   Fluoroscopy was used to confirm that the wire was in the vena cava.      The patient was placed back level and the area for the pocket was anethetized   with local anesthetic.  A 3-cm transverse incision was made with a #15   blade.  Cautery was used to divide the subcutaneous tissues down to the   pectoralis muscle.  An [employer] retractor was used to elevate the skin   while a pocket was created on top of the pectoralis fascia.  The port   was placed into the pocket to confirm that it was of adequate size.  The   catheter was preattached to the port.  The port was then secured to the   pectoralis fascia with four 2-0 Prolene sutures.  These were clamped and   not tied down yet.    The catheter was tunneled through to the wire exit   site.  The catheter was placed along the wire to determine what length it should be to be in the SVC.  The catheter was cut at 27 cm.  The tunneler  sheath and dilator were passed over the wire and the dilator and wire were removed.  The catheter was advanced through the tunneler sheath and the tunneler sheath was pulled away.  Care was taken to keep the catheter in the tunneler sheath as this occurred. This was advanced and the tunneler sheath was removed.  There was good venous   return and easy flush of the catheter.  The Prolene sutures were tied   down to the pectoral fascia.  The skin was reapproximated using 3-0   Vicryl interrupted deep dermal sutures.    Fluoroscopy was used to re-confirm good position of the catheter.  The skin   was then closed using 4-0 Monocryl in a subcuticular fashion.  The port was flushed with concentrated heparin flush as well.  The wounds were then cleaned, dried, and dressed with Dermabond.  The patient was awakened from anesthesia and taken to the PACU in stable condition.  Needle, sponge, and instrument counts were correct.               Stark Klein, MD

## 2017-02-23 NOTE — Anesthesia Procedure Notes (Signed)
Procedure Name: LMA Insertion Performed by: Teressa Lower Pre-anesthesia Checklist: Patient identified, Emergency Drugs available, Suction available, Patient being monitored and Timeout performed Patient Re-evaluated:Patient Re-evaluated prior to inductionOxygen Delivery Method: Circle system utilized Preoxygenation: Pre-oxygenation with 100% oxygen Intubation Type: IV induction Ventilation: Mask ventilation without difficulty LMA: LMA inserted LMA Size: 4.0 Number of attempts: 1 Placement Confirmation: breath sounds checked- equal and bilateral and positive ETCO2 Tube secured with: Tape Dental Injury: Teeth and Oropharynx as per pre-operative assessment

## 2017-02-23 NOTE — Anesthesia Postprocedure Evaluation (Signed)
Anesthesia Post Note  Patient: [name]  Procedure(s) Performed: Procedure(s) (LRB): INSERTION PORT-A-CATH (N/A)  Patient location during evaluation: PACU Anesthesia Type: General Level of consciousness: awake Pain management: pain level controlled Vital Signs Assessment: post-procedure vital signs reviewed and stable Respiratory status: spontaneous breathing Cardiovascular status: stable Anesthetic complications: no       Last Vitals:  Vitals:   02/23/17 1049 02/23/17 1530  BP: 135/79 124/70  Pulse: (!) 104 85  Resp: 18 (!) 22  Temp: 37 C 36.7 C    Last Pain:  Vitals:   02/23/17 1530  PainSc: 0-No pain                 Talissa Apple

## 2017-02-23 NOTE — Anesthesia Preprocedure Evaluation (Addendum)
Anesthesia Evaluation  Patient identified by MRN, date of birth, ID band Patient awake    Reviewed: Allergy & Precautions, NPO status , Patient's Chart, lab work & pertinent test results  Airway Mallampati: II  TM Distance: >3 FB     Dental   Pulmonary neg pulmonary ROS,    breath sounds clear to auscultation       Cardiovascular negative cardio ROS   Rhythm:Regular Rate:Normal     Neuro/Psych negative neurological ROS     GI/Hepatic Neg liver ROS, GERD  ,  Endo/Other    Renal/GU negative Renal ROS     Musculoskeletal   Abdominal   Peds  Hematology  (+) anemia ,   Anesthesia Other Findings   Reproductive/Obstetrics                             Anesthesia Physical Anesthesia Plan  ASA: III  Anesthesia Plan: General   Post-op Pain Management:    Induction: Intravenous  Airway Management Planned: LMA  Additional Equipment:   Intra-op Plan:   Post-operative Plan: Extubation in OR  Informed Consent: I have reviewed the patients History and Physical, chart, labs and discussed the procedure including the risks, benefits and alternatives for the proposed anesthesia with the patient or authorized representative who has indicated his/her understanding and acceptance.   Dental advisory given  Plan Discussed with: Anesthesiologist and CRNA  Anesthesia Plan Comments:         Anesthesia Quick Evaluation

## 2017-02-23 NOTE — Transfer of Care (Signed)
Immediate Anesthesia Transfer of Care Note  Patient: [name]  Procedure(s) Performed: Procedure(s): INSERTION PORT-A-CATH (N/A)  Patient Location: PACU  Anesthesia Type:General  Level of Consciousness: awake, oriented and patient cooperative  Airway & Oxygen Therapy: Patient Spontanous Breathing and Patient connected to nasal cannula oxygen  Post-op Assessment: Report given to RN, Post -op Vital signs reviewed and stable and Patient moving all extremities  Post vital signs: Reviewed and stable  Last Vitals:  Vitals:   02/23/17 1049  BP: 135/79  Pulse: (!) 104  Resp: 18  Temp: 37 C    Last Pain: There were no vitals filed for this visit.    Patients Stated Pain Goal: 3 (39/43/20 0379)  Complications: No apparent anesthesia complications

## 2017-02-24 ENCOUNTER — Encounter (HOSPITAL_COMMUNITY): Payer: Self-pay | Admitting: General Surgery

## 2017-03-02 ENCOUNTER — Ambulatory Visit: Payer: BLUE CROSS/BLUE SHIELD | Admitting: Hematology

## 2017-03-02 ENCOUNTER — Other Ambulatory Visit: Payer: BLUE CROSS/BLUE SHIELD

## 2017-03-09 ENCOUNTER — Ambulatory Visit: Payer: BLUE CROSS/BLUE SHIELD | Admitting: Hematology

## 2017-03-09 ENCOUNTER — Other Ambulatory Visit: Payer: BLUE CROSS/BLUE SHIELD

## 2017-03-14 ENCOUNTER — Other Ambulatory Visit (HOSPITAL_BASED_OUTPATIENT_CLINIC_OR_DEPARTMENT_OTHER): Payer: BLUE CROSS/BLUE SHIELD

## 2017-03-14 ENCOUNTER — Other Ambulatory Visit: Payer: Self-pay | Admitting: *Deleted

## 2017-03-14 ENCOUNTER — Ambulatory Visit (HOSPITAL_BASED_OUTPATIENT_CLINIC_OR_DEPARTMENT_OTHER): Payer: BLUE CROSS/BLUE SHIELD | Admitting: Hematology

## 2017-03-14 ENCOUNTER — Other Ambulatory Visit: Payer: Self-pay | Admitting: General Surgery

## 2017-03-14 ENCOUNTER — Encounter: Payer: Self-pay | Admitting: Hematology

## 2017-03-14 ENCOUNTER — Telehealth: Payer: Self-pay | Admitting: Hematology

## 2017-03-14 VITALS — BP 123/69 | HR 98 | Temp 97.5°F | Resp 18 | Wt 241.2 lb

## 2017-03-14 DIAGNOSIS — Z7189 Other specified counseling: Secondary | ICD-10-CM

## 2017-03-14 DIAGNOSIS — A0472 Enterocolitis due to Clostridium difficile, not specified as recurrent: Secondary | ICD-10-CM

## 2017-03-14 DIAGNOSIS — E440 Moderate protein-calorie malnutrition: Secondary | ICD-10-CM | POA: Diagnosis not present

## 2017-03-14 DIAGNOSIS — C169 Malignant neoplasm of stomach, unspecified: Secondary | ICD-10-CM

## 2017-03-14 DIAGNOSIS — C163 Malignant neoplasm of pyloric antrum: Secondary | ICD-10-CM

## 2017-03-14 DIAGNOSIS — D630 Anemia in neoplastic disease: Secondary | ICD-10-CM | POA: Diagnosis not present

## 2017-03-14 LAB — CBC & DIFF AND RETIC
BASO%: 0.9 % (ref 0.0–2.0)
Basophils Absolute: 0.1 10*3/uL (ref 0.0–0.1)
EOS%: 1.1 % (ref 0.0–7.0)
Eosinophils Absolute: 0.1 10*3/uL (ref 0.0–0.5)
HCT: 36.1 % — ABNORMAL LOW (ref 38.4–49.9)
HGB: 11.1 g/dL — ABNORMAL LOW (ref 13.0–17.1)
Immature Retic Fract: 5.4 % (ref 3.00–10.60)
LYMPH%: 13.1 % — ABNORMAL LOW (ref 14.0–49.0)
MCH: 22.9 pg — ABNORMAL LOW (ref 27.2–33.4)
MCHC: 30.7 g/dL — ABNORMAL LOW (ref 32.0–36.0)
MCV: 74.4 fL — ABNORMAL LOW (ref 79.3–98.0)
MONO#: 0.8 10*3/uL (ref 0.1–0.9)
MONO%: 9.6 % (ref 0.0–14.0)
NEUT#: 6.4 10*3/uL (ref 1.5–6.5)
NEUT%: 75.3 % — ABNORMAL HIGH (ref 39.0–75.0)
Platelets: 374 10*3/uL (ref 140–400)
RBC: 4.85 10*6/uL (ref 4.20–5.82)
RDW: 21.4 % — ABNORMAL HIGH (ref 11.0–14.6)
Retic %: 1.03 % (ref 0.80–1.80)
Retic Ct Abs: 49.96 10*3/uL (ref 34.80–93.90)
WBC: 8.5 10*3/uL (ref 4.0–10.3)
lymph#: 1.1 10*3/uL (ref 0.9–3.3)
nRBC: 0 % (ref 0–0)

## 2017-03-14 LAB — COMPREHENSIVE METABOLIC PANEL
ALT: 36 U/L (ref 0–55)
AST: 26 U/L (ref 5–34)
Albumin: 3 g/dL — ABNORMAL LOW (ref 3.5–5.0)
Alkaline Phosphatase: 103 U/L (ref 40–150)
Anion Gap: 11 mEq/L (ref 3–11)
BUN: 12.8 mg/dL (ref 7.0–26.0)
CO2: 26 mEq/L (ref 22–29)
Calcium: 9.7 mg/dL (ref 8.4–10.4)
Chloride: 103 mEq/L (ref 98–109)
Creatinine: 1 mg/dL (ref 0.7–1.3)
EGFR: >90 mL/min/{1.73_m2} (ref >90)
Glucose: 147 mg/dl — ABNORMAL HIGH (ref 70–140)
Potassium: 4 mEq/L (ref 3.5–5.1)
Sodium: 139 mEq/L (ref 136–145)
Total Bilirubin: 0.71 mg/dL (ref 0.20–1.20)
Total Protein: 8 g/dL (ref 6.4–8.3)

## 2017-03-14 LAB — IRON AND TIBC
%SAT: 11 % — ABNORMAL LOW (ref 20–55)
Iron: 21 ug/dL — ABNORMAL LOW (ref 42–163)
TIBC: 200 ug/dL — ABNORMAL LOW (ref 202–409)
UIBC: 179 ug/dL (ref 117–376)

## 2017-03-14 LAB — FERRITIN: Ferritin: 355 ng/ml — ABNORMAL HIGH (ref 22–316)

## 2017-03-14 LAB — TECHNOLOGIST REVIEW

## 2017-03-14 MED ORDER — VANCOMYCIN 50 MG/ML ORAL SOLUTION: 125.0000 mg | Freq: Two times a day (BID) | ORAL | Status: DC

## 2017-03-14 NOTE — Patient Instructions (Signed)
Thank you for choosing [facility] to provide your oncology and hematology care.  To afford each patient quality time with our providers, please arrive 30 minutes before your scheduled appointment time.  If you arrive late for your appointment, you may be asked to reschedule.  We strive to give you quality time with our providers, and arriving late affects you and other patients whose appointments are after yours.  If you are a no show for multiple scheduled visits, you may be dismissed from the clinic at the providers discretion.   Again, thank you for choosing [facility], our hope is that these requests will decrease the amount of time that you wait before being seen by our physicians.  ______________________________________________________________________ Should you have questions after your visit to the [facility], please contact our office at [phone] between the hours of 8:30 and 4:30 p.m.    Voicemails left after 4:30p.m will not be returned until the following business day.   For prescription refill requests, please have your pharmacy contact us directly.  Please also try to allow 48 hours for prescription requests.   Please contact the scheduling department for questions regarding scheduling.  For scheduling of procedures such as PET scans, CT scans, MRI, Ultrasound, etc please contact central scheduling at [phone].   Resources For Cancer Patients and Caregivers:  American Cancer Society:  800-227-2345  Can help patients locate various types of support and financial assistance Cancer Care: 1-800-813-HOPE (4673) Provides financial assistance, online support groups, medication/co-pay assistance.   [place] DSS:  336-641-3447 Where to apply for food stamps, Medicaid, and utility assistance Medicare Rights Center: 800-333-4114 Helps people with Medicare understand their rights and benefits, navigate the Medicare system, and secure the  quality healthcare they deserve SCAT: 336-333-6589 [place] [employer]'s shared-ride transportation service for eligible riders who have a disability that prevents them from riding the fixed route bus.   For additional information on assistance programs please contact our social worker:   Grier Hock/Abigail Elmore:  [phone] 

## 2017-03-14 NOTE — Telephone Encounter (Signed)
Gave patient AVS and calender per 5/1 los. [facility] to contact patient with Ct schedule - patient aware.

## 2017-03-14 NOTE — Telephone Encounter (Signed)
Scheduled appt per 5/1 LOS - no treatment care plan in yet.

## 2017-03-15 ENCOUNTER — Other Ambulatory Visit: Payer: Self-pay | Admitting: *Deleted

## 2017-03-15 ENCOUNTER — Other Ambulatory Visit (HOSPITAL_COMMUNITY)
Admission: RE | Admit: 2017-03-15 | Discharge: 2017-03-15 | Disposition: A | Discharge status: Home / Self Care | Payer: BLUE CROSS/BLUE SHIELD | Source: Ambulatory Visit | Attending: Hematology | Admitting: Hematology

## 2017-03-15 ENCOUNTER — Telehealth: Payer: Self-pay | Admitting: *Deleted

## 2017-03-15 ENCOUNTER — Other Ambulatory Visit: Payer: Self-pay | Admitting: Hematology

## 2017-03-15 DIAGNOSIS — C163 Malignant neoplasm of pyloric antrum: Secondary | ICD-10-CM

## 2017-03-15 DIAGNOSIS — Z7189 Other specified counseling: Secondary | ICD-10-CM | POA: Insufficient documentation

## 2017-03-15 LAB — VITAMIN B12: Vitamin B12: 1111 pg/mL (ref 232–1245)

## 2017-03-15 MED ORDER — VANCOMYCIN 50 MG/ML ORAL SOLUTION: 125.0000 mg | Freq: Two times a day (BID) | ORAL | 0 refills | Status: AC

## 2017-03-15 MED ORDER — VANCOMYCIN HCL 125 MG PO CAPS: 125.0000 mg | ORAL_CAPSULE | Freq: Two times a day (BID) | ORAL | 1 refills | Status: DC

## 2017-03-15 MED ORDER — LIDOCAINE-PRILOCAINE 2.5-2.5 % EX CREA: TOPICAL_CREAM | CUTANEOUS | 3 refills | Status: DC

## 2017-03-15 MED ORDER — ONDANSETRON HCL 8 MG PO TABS: 8.0000 mg | ORAL_TABLET | Freq: Two times a day (BID) | ORAL | 1 refills | Status: DC | PRN

## 2017-03-15 MED ORDER — PROCHLORPERAZINE MALEATE 10 MG PO TABS: 10.0000 mg | ORAL_TABLET | Freq: Four times a day (QID) | ORAL | 1 refills | Status: DC | PRN

## 2017-03-15 MED ORDER — DEXAMETHASONE 4 MG PO TABS: 8.0000 mg | ORAL_TABLET | Freq: Every day | ORAL | 1 refills | Status: DC

## 2017-03-15 NOTE — Progress Notes (Signed)
[name] Kitchen    HEMATOLOGY/ONCOLOGY CONSULTATION NOTE  Date of Service: 03/15/2017  Patient Care Team: [name], MD as PCP - General (Internal Medicine) [name] (Surgery)   CHIEF COMPLAINTS/PURPOSE OF CONSULTATION:   f/u for gastric Adenocarcinoma.  HISTORY OF PRESENTING ILLNESS:   [name] is a wonderful 66 y.o. male is here for continued evaluation and management of recently diagnosed pT4a, pN3b, pM1 Gastric Adenocarcinoma.  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 Patient had presented to primary care physician with dyspeptic symptoms, partial gastric outlet obstruction and weight loss of 60 pounds. Patient has had a distal gastrectomy, cholecystectomy with jejunal feeding tube placement by Dr. Barry Dienes on 01/10/2017 for gastric obstruction secondary to adenocarcinoma of the gastric pylorus. He was noted to have invaded into the pancreas and a bit of the anterior pancreatic head was resected en bloc with the tumor.  Pathology showed  " INVASIVE MODERATELY TO POORLY DIFFERENTIATED ADENOCARCINOMA, SPANNING 4 CM IN GREATEST DIMENSION. TUMOR INVADES THROUGH STOMACH WALL TO INVOLVE SEROSAL SURFACE.  LYMPH/VASCULAR INVASION IS IDENTIFIED.  MARGINS ARE NEGATIVE.- FIFTEEN OF THIRTY NINE LYMPH NODES POSITIVE FOR METASTATIC ADENOCARCINOMA (15/39). EXTRACAPSULAR EXTENSION IS IDENTIFIED. 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  Patient had a prolonged hospitalization post surgery  due to significant NG tube output. Needed epidural and PCA for pain control. It took a while for him to be able to tolerate progression of 2 feeding. He had an abdominal drain due to a small collection near his pancreatic head. Was treated with Augmentin for this. Patient was in the hospital from 01/10/2017  and was eventually discharged on 01/27/2017.  Patient postponed several attempts at posthospitalization follow-up. He has been recovering at home and has been optimizing his tube feeding. Patient was recently followed up by Dr. Barry Dienes and has had a port placement on 02/23/2017 and preparation for possible chemotherapy.  He notes that his tube feeding is up to 40 mL per hour for 12 hours and has targeted is 16 ml per hour. He was recently admitted to [facility] in [place] for a C. difficile colitis that caused a severe diarrhea. he is currently on oral vancomycin has completed about 5 days of treatment and is also on probiotics.  He is here to discuss the role for and options for adjuvant chemotherapy .  MEDICAL HISTORY:  Past Medical History:  Diagnosis Date  . Anemia   . Cancer [facility])    Gastric Adenocarcinoma  . Gastric outlet obstruction 01/2017  . GERD (gastroesophageal reflux disease)   . Pre-diabetes    denies  . SVT (supraventricular tachycardia) ([place])    during Admission and Surgery- 01/2017    SURGICAL HISTORY: Past Surgical History:  Procedure Laterality Date  . CHOLECYSTECTOMY N/A 01/10/2017   Procedure: CHOLECYSTECTOMY;  Surgeon: Stark Klein, MD;  Location: [place];  Service: General;  Laterality: N/A;  . COLONOSCOPY    . COLONOSCOPY  10/11/2011   Procedure: COLONOSCOPY;  Surgeon: Daneil Dolin, MD;  [facility];  Service: Endoscopy;  Laterality: N/A;  10:30 AM  . ESOPHAGOGASTRODUODENOSCOPY N/A 12/30/2016   Procedure: ESOPHAGOGASTRODUODENOSCOPY (EGD);  Surgeon: Carol Ada, MD;  Location[facility];  Service: Endoscopy;  Laterality: N/A;  . GASTRECTOMY N/A 01/10/2017   Procedure: DISTAL GASTRECTOMY, JEJUNUM  FEEDING TUBE PLACEMENT;  Surgeon: Stark Klein, MD;  Location: [place];  Service:  General;  Laterality: N/A;  . IR GENERIC HISTORICAL  01/24/2017   IR [place] DUODEN/JEJUNO TUBE PERCUT W/FLUORO 01/24/2017 Aletta Edouard, MD [facility]-INTERV RAD  . PORTACATH PLACEMENT N/A  02/23/2017   Procedure: INSERTION PORT-A-CATH;  Surgeon: Stark Klein, MD;  Location: [place];  Service: General;  Laterality: N/A;  . testicle removed  1982    SOCIAL HISTORY: Social History   Social History  . Marital status: Married    Spouse name: N/A  . Number of children: N/A  . Years of education: N/A   Occupational History  . Not on file.   Social History Main Topics  . Smoking status: Never Smoker  . Smokeless tobacco: Never Used  . Alcohol use No     Comment: occ beer or wine but none in last 6 months   . Drug use: No  . Sexual activity: Not on file   Other Topics Concern  . Not on file   Social History Narrative  . No narrative on file    FAMILY HISTORY: Family History  Problem Relation Age of Onset  . Prostate cancer Father   . Colon cancer Neg Hx     ALLERGIES:  is allergic to no known allergies.  MEDICATIONS:  Current Outpatient Prescriptions  Medication Sig Dispense Refill  . acetaminophen (TYLENOL) 325 MG tablet Take by mouth.    . cholestyramine light (PREVALITE) 4 g packet Take by mouth.    . famotidine (PEPCID) 20 MG tablet one tablet (20 mg total) by Per G Tube route 2 (two) times daily.    . Nutritional Supplements (NUTREN 1.5) LIQD Inject into the vein.     . vancomycin (VANCOCIN) 50 mg/mL oral solution Take by mouth.    . chlorproMAZINE (THORAZINE) 100 MG tablet Take 1 tablet (100 mg total) by mouth 4 (four) times daily as needed for hiccoughs. 20 tablet 0  . ferrous sulfate 325 (65 FE) MG tablet Take 325 mg by mouth daily with breakfast.    . HYDROcodone-acetaminophen (NORCO/VICODIN) 5-325 MG tablet Take 1-2 tablets by mouth every 6 (six) hours as needed for moderate pain. 20 tablet 0  . zolpidem (AMBIEN) 5 MG tablet Take 5 mg by mouth at bedtime as needed for sleep.     No current facility-administered medications for this visit.     REVIEW OF SYSTEMS:    10 Point review of Systems was done is negative except as noted above.  PHYSICAL  EXAMINATION: ECOG PERFORMANCE STATUS: 2 - Symptomatic, <50% confined to bed  . Vitals:   03/14/17 1451  BP: 123/69  Pulse: 98  Resp: 18  Temp: 97.5 F (36.4 C)   Filed Weights   03/14/17 1451  Weight: 241 lb 3 oz (109.4 kg)   .Body mass index is 33.64 kg/m.  GENERAL:alert, in no acute distress and comfortable SKIN: no acute rashes, no significant lesions EYES: conjunctiva are pink and non-injected, sclera anicteric OROPHARYNX: MMM, no exudates, no oropharyngeal erythema or ulceration NECK: supple, no JVD LYMPH:  no palpable lymphadenopathy in the cervical, axillary or inguinal regions LUNGS: clear to auscultation b/l with normal respiratory effort HEART: regular rate & rhythm ABDOMEN:  normoactive bowel sounds , non tender, not distended. Extremity: no pedal edema PSYCH: alert & oriented x 3 with fluent speech NEURO: no focal motor/sensory deficits  LABORATORY DATA:  I have reviewed the data as listed  . CBC Latest Ref Rng & Units 03/14/2017 02/23/2017 01/27/2017  WBC 4.0 - 10.3 10e3/uL 8.5 10.1 10.6(H)  Hemoglobin  13.0 - 17.1 g/dL 11.1(L) 8.7(L) 7.1(L)  Hematocrit 38.4 - 49.9 % 36.1(L) 29.6(L) 23.3(L)  Platelets 140 - 400 10e3/uL 374 472(H) 634(H)    . CMP Latest Ref Rng & Units 03/14/2017 02/23/2017 01/26/2017  Glucose 70 - 140 mg/dl 147(H) 149(H) 158(H)  BUN 7.0 - 26.0 mg/dL 12.8 10 9   Creatinine 0.7 - 1.3 mg/dL 1.0 0.95 0.78  Sodium 136 - 145 mEq/L 139 138 136  Potassium 3.5 - 5.1 mEq/L 4.0 4.0 3.9  Chloride 101 - 111 mmol/L - 104 106  CO2 22 - 29 mEq/L 26 22 25   Calcium 8.4 - 10.4 mg/dL 9.7 9.1 8.0(L)  Total Protein 6.4 - 8.3 g/dL 8.0 - -  Total Bilirubin 0.20 - 1.20 mg/dL 0.71 - -  Alkaline Phos 40 - 150 U/L 103 - -  AST 5 - 34 U/L 26 - -  ALT 0 - 55 U/L 36 - -      RADIOGRAPHIC STUDIES: I have personally reviewed the radiological images as listed and agreed with the findings in the report. Dg Chest Port 1 View  Result Date: 02/23/2017 CLINICAL DATA:   Port-A-Cath placement EXAM: PORTABLE CHEST 1 VIEW COMPARISON:  03/01/2016 FINDINGS: Cardiomediastinal silhouette is stable. No infiltrate or pulmonary edema. There is linear atelectasis right base. There is left subclavian Port-A-Cath with tip in distal SVC. No pneumothorax. IMPRESSION: No infiltrate or pulmonary edema. Linear atelectasis noted right base. Left subclavian Port-A-Cath in place. No pneumothorax. Electronically Signed   By: Lahoma Crocker M.D.   On: 02/23/2017 16:45   Dg Fluoro Guide Cv Line-no Report  Result Date: 02/23/2017 Fluoroscopy was utilized by the requesting physician.  No radiographic interpretation.    ASSESSMENT & PLAN:   66 year old African-American male with  1) Newly diagnosed pT4a, pN3b, pM1 invasive moderately-poorly Gastric Adenocarcinoma . Her 2 neg by FISH LVI + margins neg 15/39 LN +ve, Extracapsular invasion +, some involvement of pancreatic capsule there pM1 Presented with severe pyloric stenosis with ulceration. 60 pound weight loss. S/p distal gastrectomy, cholecystectomy and en bloc dissection off the small part of the pancreatic head  with jejunal feeding tube placement. Initial CT abdomen and pelvis do not show any local regional lymphadenopathy.  #2 moderate protein calorie malnutrition patient has lost about 60 pounds in the last 2-3 months. Currently started on jejunal tube feeding.  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 Plan  -We discussed his diagnosis, staging, prognosis and high risk of recurrence locally and metastatic disease. -Based on previous scans he had achieved NED status after surgery however it's been a couple of months since deceased assessment and we discussed that it would be important to get a CT chest abdomen pelvis to get a good baseline scan prior to starting chemotherapy. -He will complete his oral vancomycin 4 times  daily for 10 days and then will continue oral vancomycin twice daily for an additional 3-4 weeks since he will be getting chemotherapy and is at high risk for C. difficile recurrence. -We should consult Ernestene Kiel for optimizing his nutrition -We will set him up for chemotherapy counseling for FOLFOX chemotherapy. -We shall set him up to start FOLFOX in the next couple weeks after his CT scans to allow him to recover from his C. difficile infection. -he understand that he is significant risk of metastatic disease as well as local recurrence.  #4 suspected sleep apnea.Has  not had a sleep study.Wife notes significant snoring which is important with weight loss. Will have to be careful with excessive sedation.  #5 obesity - has lost in excess of 60 pounds since his symptoms started in November 2017.  Nutritional therapist for tube feeding and po feeding Chemo-counseling for FOLFOX CT chest/abd/pelvis in 7-10 days FOLFOX to start in 2 weeks with labs  All of the patients questions were answered with apparent satisfaction. The patient knows to call the clinic with any problems, questions or concerns.  I spent 30 minutes counseling the patient face to face. The total time spent in the appointment was 40 minutes and more than 50% was on counseling and direct patient cares.   All of the patients questions were answered with apparent satisfaction. The patient knows to call the clinic with any problems, questions or concerns.   Sullivan Lone MD [place] AAHIVMS [facility] Hematology/Oncology Physician [facility]  (Office):       [phone] (Work cell):  [phone] (Fax):           [phone]

## 2017-03-15 NOTE — Telephone Encounter (Signed)
Per Dr. Irene Limbo, spoke with [facility] @ [facility] pathology to add on MSI and PDL1 testing to pathology from 2/27.  Ok per [employer].  Session #[id number]

## 2017-03-15 NOTE — Progress Notes (Signed)
START OFF PATHWAY REGIMEN - Gastroesophageal   [id number]:FOLFOX (q14d):   A cycle is every 14 days:     Oxaliplatin      Leucovorin      5-Fluorouracil      5-Fluorouracil   **Always confirm dose/schedule in your pharmacy ordering system**    Patient Characteristics: Gastric, Adenocarcinoma, Postoperative without Neoadjuvant Therapy (Pathologic Staging), Distant Metastasis Histology: Adenocarcinoma Disease Classification: Gastric Therapeutic Status: Postoperative without Neoadjuvant Therapy (Pathologic Staging) AJCC M Category: pM1 AJCC 8 Stage Grouping: IV AJCC N Category: pN3b AJCC T Category: pT4b  Intent of Therapy: Non-Curative / Palliative Intent, Discussed with Patient

## 2017-03-16 ENCOUNTER — Encounter: Payer: Self-pay | Admitting: *Deleted

## 2017-03-17 ENCOUNTER — Other Ambulatory Visit: Payer: Self-pay | Admitting: Hematology

## 2017-03-17 DIAGNOSIS — Z7189 Other specified counseling: Secondary | ICD-10-CM | POA: Insufficient documentation

## 2017-03-21 ENCOUNTER — Ambulatory Visit (HOSPITAL_COMMUNITY)
Admission: RE | Admit: 2017-03-21 | Discharge: 2017-03-21 | Disposition: A | Discharge status: Home / Self Care | Payer: BLUE CROSS/BLUE SHIELD | Source: Ambulatory Visit | Attending: Hematology | Admitting: Hematology

## 2017-03-21 ENCOUNTER — Other Ambulatory Visit: Payer: BLUE CROSS/BLUE SHIELD

## 2017-03-21 ENCOUNTER — Encounter: Payer: Self-pay | Admitting: *Deleted

## 2017-03-21 ENCOUNTER — Encounter (HOSPITAL_COMMUNITY): Payer: Self-pay

## 2017-03-21 ENCOUNTER — Ambulatory Visit: Payer: BLUE CROSS/BLUE SHIELD | Admitting: Nutrition

## 2017-03-21 DIAGNOSIS — C169 Malignant neoplasm of stomach, unspecified: Secondary | ICD-10-CM | POA: Diagnosis present

## 2017-03-21 DIAGNOSIS — I700 Atherosclerosis of aorta: Secondary | ICD-10-CM | POA: Diagnosis not present

## 2017-03-21 DIAGNOSIS — Z9889 Other specified postprocedural states: Secondary | ICD-10-CM | POA: Insufficient documentation

## 2017-03-21 MED ORDER — IOPAMIDOL (ISOVUE-300) INJECTION 61%
INTRAVENOUS | Status: AC
  Administered 2017-03-21: 100 mL
  Filled 2017-03-21: qty 100

## 2017-03-21 NOTE — Progress Notes (Signed)
67 year old male diagnosed with gastric cancer status post jejunostomy feeding tube and partial gastrectomy.  He is a patient of Dr. Irene Limbo.  Past medical history includes SVT prediabetes, GERD, gastric outlet obstruction and anemia.  Medications include Decadron, Pepcid, ferrous sulfate, Zofran, Compazine, and Banatrol.  Labs include glucose 147, albumin 3.0.  Height: 5 feet 11 inches. Weight: 241 pounds on May 1. Usual body weight: 270 pounds February 2018 BMI: 33.64.  Estimated nutrition needs: 2400-2600 calories, 140-160 grams protein, 2.6 L fluid.  Patient was diagnosed with severe malnutrition in the context of chronic illness, during recent hospitalization. He has lost approximately 60 pounds. He is tolerating jejunostomy feedings of Nutren 1.5 at 50 mL an hour for 12 hours.  He is increasing Nutren 1.5-60 mL an hour tonight provide 1080 calories, 51 g protein, 573 mL free water. Patient reports his stools are now solid but he continues antibiotics for positive C. difficile colitis. Dietary recall reveals patient is eating less than 1000 cal, 36 g protein per day. Tube feeding plus oral intake isn't adequate at this time.  Nutrition diagnosis:  Inadequate oral intake related to gastric cancer and associated treatments as evidenced by 30 pounds weight loss in 3 months and need for J-tube feeding.  Intervention: Patient is permitted by M.D. to begin adding high-fiber foods to his meal plan. Educated patient to begin one new high fiber food in small amounts every 2 days and keep a food journal. Recommended patient increase oral nutrition supplements of boost plus or Ensure Plus twice a day Provided samples.  Monitoring, evaluation, goals: Patient will tolerate oral diet plus tube feeding to meet greater than 90% of estimated nutrition needs to promote weight maintenance during treatment.  Next visit: Wednesday, May 16, during chemotherapy.  **Disclaimer: This note was dictated  with voice recognition software. Similar sounding words can inadvertently be transcribed and this note may contain transcription errors which may not have been corrected upon publication of note.**

## 2017-03-23 ENCOUNTER — Encounter (HOSPITAL_COMMUNITY): Payer: Self-pay

## 2017-03-25 ENCOUNTER — Encounter (HOSPITAL_COMMUNITY): Payer: Self-pay | Admitting: Emergency Medicine

## 2017-03-25 ENCOUNTER — Observation Stay (HOSPITAL_COMMUNITY)
Admission: EM | Admit: 2017-03-25 | Discharge: 2017-03-26 | Disposition: A | Discharge status: Home / Self Care | Payer: BLUE CROSS/BLUE SHIELD | Attending: Nephrology | Admitting: Nephrology

## 2017-03-25 ENCOUNTER — Telehealth: Payer: Self-pay | Admitting: General Surgery

## 2017-03-25 DIAGNOSIS — Z903 Acquired absence of stomach [part of]: Secondary | ICD-10-CM | POA: Diagnosis not present

## 2017-03-25 DIAGNOSIS — R7303 Prediabetes: Secondary | ICD-10-CM | POA: Insufficient documentation

## 2017-03-25 DIAGNOSIS — D649 Anemia, unspecified: Secondary | ICD-10-CM | POA: Diagnosis not present

## 2017-03-25 DIAGNOSIS — Z4659 Encounter for fitting and adjustment of other gastrointestinal appliance and device: Principal | ICD-10-CM | POA: Diagnosis present

## 2017-03-25 DIAGNOSIS — C160 Malignant neoplasm of cardia: Secondary | ICD-10-CM | POA: Diagnosis present

## 2017-03-25 DIAGNOSIS — Z8619 Personal history of other infectious and parasitic diseases: Secondary | ICD-10-CM

## 2017-03-25 DIAGNOSIS — C169 Malignant neoplasm of stomach, unspecified: Secondary | ICD-10-CM | POA: Insufficient documentation

## 2017-03-25 DIAGNOSIS — Z789 Other specified health status: Secondary | ICD-10-CM

## 2017-03-25 DIAGNOSIS — Z79899 Other long term (current) drug therapy: Secondary | ICD-10-CM | POA: Insufficient documentation

## 2017-03-25 DIAGNOSIS — C163 Malignant neoplasm of pyloric antrum: Secondary | ICD-10-CM | POA: Diagnosis not present

## 2017-03-25 DIAGNOSIS — Z8042 Family history of malignant neoplasm of prostate: Secondary | ICD-10-CM | POA: Diagnosis not present

## 2017-03-25 DIAGNOSIS — K219 Gastro-esophageal reflux disease without esophagitis: Secondary | ICD-10-CM | POA: Diagnosis present

## 2017-03-25 DIAGNOSIS — Z434 Encounter for attention to other artificial openings of digestive tract: Secondary | ICD-10-CM

## 2017-03-25 DIAGNOSIS — T85528A Displacement of other gastrointestinal prosthetic devices, implants and grafts, initial encounter: Secondary | ICD-10-CM

## 2017-03-25 LAB — COMPREHENSIVE METABOLIC PANEL
ALT: 17 U/L (ref 17–63)
AST: 19 U/L (ref 15–41)
Albumin: 3 g/dL — ABNORMAL LOW (ref 3.5–5.0)
Alkaline Phosphatase: 84 U/L (ref 38–126)
Anion gap: 8 (ref 5–15)
BUN: 9 mg/dL (ref 6–20)
CO2: 26 mmol/L (ref 22–32)
Calcium: 9.2 mg/dL (ref 8.9–10.3)
Chloride: 100 mmol/L — ABNORMAL LOW (ref 101–111)
Creatinine, Ser: 0.84 mg/dL (ref 0.61–1.24)
GFR calc Af Amer: >60 mL/min (ref >60)
GFR calc non Af Amer: >60 mL/min (ref >60)
Glucose, Bld: 108 mg/dL — ABNORMAL HIGH (ref 65–99)
Potassium: 4.1 mmol/L (ref 3.5–5.1)
Sodium: 134 mmol/L — ABNORMAL LOW (ref 135–145)
Total Bilirubin: 0.6 mg/dL (ref 0.3–1.2)
Total Protein: 7.3 g/dL (ref 6.5–8.1)

## 2017-03-25 LAB — CBC WITH DIFFERENTIAL/PLATELET
Basophils Absolute: 0.1 10*3/uL (ref 0.0–0.1)
Basophils Relative: 1 %
Eosinophils Absolute: 0.1 10*3/uL (ref 0.0–0.7)
Eosinophils Relative: 1 %
HCT: 34 % — ABNORMAL LOW (ref 39.0–52.0)
Hemoglobin: 10.5 g/dL — ABNORMAL LOW (ref 13.0–17.0)
Lymphocytes Relative: 23 %
Lymphs Abs: 1.7 10*3/uL (ref 0.7–4.0)
MCH: 23.2 pg — ABNORMAL LOW (ref 26.0–34.0)
MCHC: 30.9 g/dL (ref 30.0–36.0)
MCV: 75.2 fL — ABNORMAL LOW (ref 78.0–100.0)
Monocytes Absolute: 0.4 10*3/uL (ref 0.1–1.0)
Monocytes Relative: 5 %
Neutro Abs: 5.2 10*3/uL (ref 1.7–7.7)
Neutrophils Relative %: 70 %
Platelets: 348 10*3/uL (ref 150–400)
RBC: 4.52 MIL/uL (ref 4.22–5.81)
RDW: 21.4 % — ABNORMAL HIGH (ref 11.5–15.5)
WBC: 7.5 10*3/uL (ref 4.0–10.5)

## 2017-03-25 MED ORDER — ONDANSETRON HCL 4 MG PO TABS: 4.0000 mg | ORAL_TABLET | Freq: Four times a day (QID) | ORAL | Status: DC | PRN

## 2017-03-25 MED ORDER — CHLORPROMAZINE HCL 25 MG PO TABS: 100.0000 mg | ORAL_TABLET | Freq: Four times a day (QID) | ORAL | Status: DC | PRN

## 2017-03-25 MED ORDER — SODIUM CHLORIDE 0.9% FLUSH: 3.0000 mL | INTRAVENOUS | Status: DC | PRN

## 2017-03-25 MED ORDER — FERROUS SULFATE 325 (65 FE) MG PO TABS
325.0000 mg | ORAL_TABLET | Freq: Every day | ORAL | Status: DC
  Administered 2017-03-26: 325 mg via ORAL
  Filled 2017-03-25: qty 1

## 2017-03-25 MED ORDER — ENSURE ENLIVE PO LIQD
237.0000 mL | Freq: Two times a day (BID) | ORAL | Status: DC
  Administered 2017-03-26: 237 mL via ORAL

## 2017-03-25 MED ORDER — ACETAMINOPHEN 650 MG RE SUPP: 650.0000 mg | Freq: Four times a day (QID) | RECTAL | Status: DC | PRN

## 2017-03-25 MED ORDER — HYDROCODONE-ACETAMINOPHEN 5-325 MG PO TABS
1.0000 | ORAL_TABLET | Freq: Four times a day (QID) | ORAL | Status: DC | PRN
  Administered 2017-03-26: 1 via ORAL
  Filled 2017-03-25: qty 1

## 2017-03-25 MED ORDER — ACETAMINOPHEN 325 MG PO TABS: 650.0000 mg | ORAL_TABLET | Freq: Four times a day (QID) | ORAL | Status: DC | PRN

## 2017-03-25 MED ORDER — SODIUM CHLORIDE 0.9% FLUSH
3.0000 mL | Freq: Two times a day (BID) | INTRAVENOUS | Status: DC
  Administered 2017-03-25: 3 mL via INTRAVENOUS

## 2017-03-25 MED ORDER — ZOLPIDEM TARTRATE 5 MG PO TABS: 5.0000 mg | ORAL_TABLET | Freq: Every evening | ORAL | Status: DC | PRN

## 2017-03-25 MED ORDER — FAMOTIDINE IN NACL 20-0.9 MG/50ML-% IV SOLN
20.0000 mg | Freq: Two times a day (BID) | INTRAVENOUS | Status: DC
  Administered 2017-03-25 – 2017-03-26 (×2): 20 mg via INTRAVENOUS
  Filled 2017-03-25 (×3): qty 50

## 2017-03-25 MED ORDER — VANCOMYCIN 50 MG/ML ORAL SOLUTION
125.0000 mg | Freq: Two times a day (BID) | ORAL | Status: DC
  Administered 2017-03-25 – 2017-03-26 (×2): 125 mg via ORAL
  Filled 2017-03-25 (×3): qty 2.5

## 2017-03-25 MED ORDER — SODIUM CHLORIDE 0.9 % IV SOLN: 250.0000 mL | INTRAVENOUS | Status: DC | PRN

## 2017-03-25 MED ORDER — ONDANSETRON HCL 4 MG/2ML IJ SOLN: 4.0000 mg | Freq: Four times a day (QID) | INTRAMUSCULAR | Status: DC | PRN

## 2017-03-25 NOTE — ED Triage Notes (Signed)
PA  Unable to  Insert  Feeding  Tube .

## 2017-03-25 NOTE — H&P (Addendum)
History and Physical    Antinio Sanderfer VXY:[id number] DOB: 27-Sep-1951 DOA: 03/25/2017  PCP: [name], MD   Patient coming from: Home  Chief Complaint: Feeding tube out  HPI: Delontae Lamm is a 66 y.o. male with medical history significant for GERD, anemia, and gastric cancer status post resection in February of this year, now presenting to the emergency department after his feeding tube fell out earlier today. Patient had resection of his cancer performed in February 2018 and feeding tube was placed. He is using the tube for supplemental feeds and reports consuming approximately half of his calories by mouth. He reports drinking by mouth without incident. He notes that his tube is fallen out twice before and had been replaced in the emergency department. He had been in his usual state of health until today around noon when the tube fell out. He called his surgeon for advice and was directed to the ED for reinsertion. He denies any recent fevers or chills, denies any chest pain or palpitations, denies abdominal pain, nausea, vomiting, or diarrhea. Denies any significant pain, swelling, or erythema around the tube insertion site, but notes some scant drainage.   ED Course: Upon arrival to the ED, patient is found to be afebrile with stable vitals. No blood work, imaging, or EKG to review. ED provider was unsuccessful in attempt to reinsert the tube. IR was consulted, agreed to replace the feeding tube in the morning, and requested a medical admission. ED provider discussed having the patient return in the morning for tube replacement rather than spending the night in the hospital, but reports that medical admission was again advised. Patient is hemodynamically stable and in no apparent distress. He will be observed on medical-surgical unit for tube reinsertion by IR in the morning. Basic blood work will be obtained.  Review of Systems:  All other systems reviewed and apart from HPI, are  negative.  Past Medical History:  Diagnosis Date  . Anemia   . Cancer [facility])    Gastric Adenocarcinoma  . Gastric outlet obstruction 01/2017  . GERD (gastroesophageal reflux disease)   . Pre-diabetes    denies  . SVT (supraventricular tachycardia) ([place])    during Admission and Surgery- 01/2017    Past Surgical History:  Procedure Laterality Date  . CHOLECYSTECTOMY N/A 01/10/2017   Procedure: CHOLECYSTECTOMY;  Surgeon: Stark Klein, MD;  Location: [place];  Service: General;  Laterality: N/A;  . COLONOSCOPY    . COLONOSCOPY  10/11/2011   Procedure: COLONOSCOPY;  Surgeon: Daneil Dolin, MD;  [facility];  Service: Endoscopy;  Laterality: N/A;  10:30 AM  . ESOPHAGOGASTRODUODENOSCOPY N/A 12/30/2016   Procedure: ESOPHAGOGASTRODUODENOSCOPY (EGD);  Surgeon: Carol Ada, MD;  Location[facility];  Service: Endoscopy;  Laterality: N/A;  . GASTRECTOMY N/A 01/10/2017   Procedure: DISTAL GASTRECTOMY, JEJUNUM  FEEDING TUBE PLACEMENT;  Surgeon: Stark Klein, MD;  Location: [place];  Service: General;  Laterality: N/A;  . IR GENERIC HISTORICAL  01/24/2017   IR [place] DUODEN/JEJUNO TUBE PERCUT W/FLUORO 01/24/2017 Aletta Edouard, MD [facility]-INTERV RAD  . PORTACATH PLACEMENT N/A 02/23/2017   Procedure: INSERTION PORT-A-CATH;  Surgeon: Stark Klein, MD;  Location: [place];  Service: General;  Laterality: N/A;  . testicle removed  1982     reports that he has never smoked. He has never used smokeless tobacco. He reports that he does not drink alcohol or use drugs.  Allergies  Allergen Reactions  . No Known Allergies     Family History  Problem Relation Age of Onset  . Prostate cancer Father   . Colon cancer Neg Hx      Prior to Admission medications   Medication Sig Start Date End Date Taking? Authorizing Provider  chlorproMAZINE (THORAZINE) 100 MG tablet Take 1 tablet (100 mg total) by mouth 4 (four) times daily as needed for hiccoughs. 01/27/17   Stark Klein, MD  cholestyramine light  (PREVALITE) 4 g packet Take by mouth. 03/09/17   [provider]  dexamethasone (DECADRON) 4 MG tablet Take 2 tablets (8 mg total) by mouth daily. Start the day after chemotherapy for 2 days. Take with food. 03/15/17   Brunetta Genera, MD  famotidine (PEPCID) 20 MG tablet one tablet (20 mg total) by Per G Tube route 2 (two) times daily. 03/09/17   [provider]  ferrous sulfate 325 (65 FE) MG tablet Take 325 mg by mouth daily with breakfast.    [provider]  HYDROcodone-acetaminophen (NORCO/VICODIN) 5-325 MG tablet Take 1-2 tablets by mouth every 6 (six) hours as needed for moderate pain. 02/23/17   Stark Klein, MD  lidocaine-prilocaine (EMLA) cream Apply to affected area once 03/15/17   Brunetta Genera, MD  Nutritional Supplements (NUTREN 1.5) LIQD Inject into the vein.     [provider]  ondansetron (ZOFRAN) 8 MG tablet Take 1 tablet (8 mg total) by mouth 2 (two) times daily as needed for refractory nausea / vomiting. Start on day 3 after chemotherapy. 03/15/17   Brunetta Genera, MD  prochlorperazine (COMPAZINE) 10 MG tablet Take 1 tablet (10 mg total) by mouth every 6 (six) hours as needed (Nausea or vomiting). 03/15/17   Brunetta Genera, MD  vancomycin (VANCOCIN) 125 MG capsule Take 1 capsule (125 mg total) by mouth every 12 (twelve) hours. 03/15/17 05/14/17  Brunetta Genera, MD  vancomycin (VANCOCIN) 50 mg/mL oral solution Take 2.5 mLs (125 mg total) by mouth every 12 (twelve) hours. 03/15/17 04/05/17  Brunetta Genera, MD  zolpidem (AMBIEN) 5 MG tablet Take 5 mg by mouth at bedtime as needed for sleep. 01/30/17   [provider]    Physical Exam: Vitals:   03/25/17 1517 03/25/17 1518  BP: 130/76   Pulse: (!) 102   Resp: 16   Temp: 97.6 F (36.4 C)   TempSrc: Oral   SpO2: 99%   Weight:  106.7 kg (235 lb 4 oz)  Height:  5\' 11"  (1.803 m)      Constitutional: NAD, calm, comfortable Eyes: PERTLA, lids and conjunctivae  normal ENMT: Mucous membranes are moist. Posterior pharynx clear of any exudate or lesions.   Neck: normal, supple, no masses, no thyromegaly Respiratory: clear to auscultation bilaterally, no wheezing, no crackles. Normal respiratory effort.  Cardiovascular: S1 & S2 heard, regular rate and rhythm. No extremity edema. No significant JVD. Abdomen: No distension, soft, no tenderness, feeding tube site with scant serous drainage, no surrounding edema, erythema, or tenderness. Bowel sounds active.  Musculoskeletal: no clubbing / cyanosis. No joint deformity upper and lower extremities.  Skin: no significant rashes, lesions, ulcers. Warm, dry, well-perfused. Neurologic: CN 2-12 grossly intact. Sensation intact, DTR normal. Strength 5/5 in all 4 limbs.  Psychiatric: Alert and oriented x 3. Pleasant and cooperative.     Labs on Admission: I have personally reviewed following labs and imaging studies  CBC: No results for input(s): WBC, NEUTROABS, HGB, HCT, MCV, PLT in the last 168 hours. Basic Metabolic Panel: No results for input(s): NA, K, CL, CO2,  GLUCOSE, BUN, CREATININE, CALCIUM, MG, PHOS in the last 168 hours. GFR: Estimated Creatinine Clearance: 89.1 mL/min (by C-G formula based on SCr of 1 mg/dL). Liver Function Tests: No results for input(s): AST, ALT, ALKPHOS, BILITOT, PROT, ALBUMIN in the last 168 hours. No results for input(s): LIPASE, AMYLASE in the last 168 hours. No results for input(s): AMMONIA in the last 168 hours. Coagulation Profile: No results for input(s): INR, PROTIME in the last 168 hours. Cardiac Enzymes: No results for input(s): CKTOTAL, CKMB, CKMBINDEX, TROPONINI in the last 168 hours. BNP (last 3 results) No results for input(s): PROBNP in the last 8760 hours. HbA1C: No results for input(s): HGBA1C in the last 72 hours. CBG: No results for input(s): GLUCAP in the last 168 hours. Lipid Profile: No results for input(s): CHOL, HDL, LDLCALC, TRIG, CHOLHDL,  LDLDIRECT in the last 72 hours. Thyroid Function Tests: No results for input(s): TSH, T4TOTAL, FREET4, T3FREE, THYROIDAB in the last 72 hours. Anemia Panel: No results for input(s): VITAMINB12, FOLATE, FERRITIN, TIBC, IRON, RETICCTPCT in the last 72 hours. Urine analysis:    Component Value Date/Time   COLORURINE AMBER (A) 01/09/2017 1357   APPEARANCEUR HAZY (A) 01/09/2017 1357   LABSPEC 1.035 (H) 01/09/2017 1357   PHURINE 5.0 01/09/2017 1357   GLUCOSEU NEGATIVE 01/09/2017 1357   HGBUR NEGATIVE 01/09/2017 1357   BILIRUBINUR SMALL (A) 01/09/2017 1357   KETONESUR 80 (A) 01/09/2017 1357   PROTEINUR 30 (A) 01/09/2017 1357   NITRITE NEGATIVE 01/09/2017 1357   LEUKOCYTESUR NEGATIVE 01/09/2017 1357   Sepsis Labs: @LABRCNTIP (procalcitonin:4,lacticidven:4) )No results found for this or any previous visit (from the past 240 hour(s)).   Radiological Exams on Admission: No results found.  EKG: Not performed.   Assessment/Plan  1. Feeding tube complication - Pt with feeding for supplemental nutrition; tube fell out earlier this afternoon - ED provider unable to reinsert and IR agreed to replace in am, asked for medical admission    2. Gastric cancer  - Status-post resection in Feb 2018  - Scheduled to begin chemotherapy at [facility] on 03/29/17   3. C. difficile colitis  - No more diarrhea, no sign of infection - Continue oral vancomycin to complete the course   4. Anemia  - Hgb 11-12 range prior to surgery in Feb '18, 7-9 range after   - No pallor, no apparent bleeding, will check CBC     DVT prophylaxis: SCD's  Code Status: Full  Family Communication: Discussed with patient Disposition Plan: Observe on med-surg Consults called: Interventional Radiology Admission status: Observation    Vianne Bulls, MD [facility] Pager [phone]  If 7PM-7AM, please contact night-coverage [email or web address] Password [facility]  03/25/2017, 7:58 PM

## 2017-03-25 NOTE — ED Provider Notes (Signed)
Medical screening examination/treatment/procedure(s) were conducted as a shared visit with non-physician practitioner(s) and myself.  I personally evaluated the patient during the encounter.   EKG Interpretation None     66 year old male here after his feeding tube came out. Tube site does not show any evidence of infection. Will consult IR for tube replacement   Lacretia Leigh, MD 03/25/17 1736

## 2017-03-25 NOTE — ED Triage Notes (Signed)
Had surgery for stomach cancer in February with feeding tube placement-- feeding tube fell out this am, pt called Dr. Barry Dienes and was told to come here.

## 2017-03-25 NOTE — ED Provider Notes (Signed)
[place] DEPT Provider Note   CSN: [id number] Arrival date & time: 03/25/17  1511  By signing my name below, I, Hansel Feinstein, attest that this documentation has been prepared under the direction and in the presence of Tenesha Garza, PA-C. Electronically Signed: Hansel Feinstein, ED Scribe. 03/25/17. 4:59 PM.    History   Chief Complaint Chief Complaint  Patient presents with  . feeding tube replacement    HPI [name] is a 66 y.o. male who presents to the Emergency Department requesting jejunum feeding tube replacement, which fell out this morning. Pt had the tube placed 01/10/17 by Dr. Barry Dienes secondary to gastrectomy d/t gastric adenocarcinoma. Per pt, the tube has fallen out 3 times prior to this incident.The most recent  He also notes the opening has been seeping brown fluid for 2w, has notified Dr. Barry Dienes and was advised to clean the area with saline and peroxide. Pt states the opening does not normally have discharge and he does not have an appointment for f/u for another 3w. He denies fever, additional complaints.   The history is provided by the patient and the spouse. No language interpreter was used.    Past Medical History:  Diagnosis Date  . Anemia   . Cancer [facility])    Gastric Adenocarcinoma  . Gastric outlet obstruction 01/2017  . GERD (gastroesophageal reflux disease)   . Pre-diabetes    denies  . SVT (supraventricular tachycardia) ([place])    during Admission and Surgery- 01/2017    Patient Active Problem List   Diagnosis Date Noted  . Encounter for feeding tube placement 03/25/2017  . GERD (gastroesophageal reflux disease) 03/25/2017  . Anemia 03/25/2017  . Encounter for care related to feeding tube 03/25/2017  . History of Clostridium difficile colitis 03/25/2017  . Counseling regarding advanced directives and goals of care 03/17/2017  . Counseling regarding advanced care planning and goals of care 03/15/2017  . NSVT (nonsustained ventricular tachycardia)  ([place])   . Elevated troponin I level   . Protein-calorie malnutrition, severe 01/12/2017  . Gastric cancer ([place]) 01/10/2017    Past Surgical History:  Procedure Laterality Date  . CHOLECYSTECTOMY N/A 01/10/2017   Procedure: CHOLECYSTECTOMY;  Surgeon: Stark Klein, MD;  Location: [place];  Service: General;  Laterality: N/A;  . COLONOSCOPY    . COLONOSCOPY  10/11/2011   Procedure: COLONOSCOPY;  Surgeon: Daneil Dolin, MD;  [facility];  Service: Endoscopy;  Laterality: N/A;  10:30 AM  . ESOPHAGOGASTRODUODENOSCOPY N/A 12/30/2016   Procedure: ESOPHAGOGASTRODUODENOSCOPY (EGD);  Surgeon: Carol Ada, MD;  [facility];  Service: Endoscopy;  Laterality: N/A;  . GASTRECTOMY N/A 01/10/2017   Procedure: DISTAL GASTRECTOMY, JEJUNUM  FEEDING TUBE PLACEMENT;  Surgeon: Stark Klein, MD;  Location: [place];  Service: General;  Laterality: N/A;  . IR GENERIC HISTORICAL  01/24/2017   IR [place] DUODEN/JEJUNO TUBE PERCUT W/FLUORO 01/24/2017 Aletta Edouard, MD [facility]-INTERV RAD  . PORTACATH PLACEMENT N/A 02/23/2017   Procedure: INSERTION PORT-A-CATH;  Surgeon: Stark Klein, MD;  Location: [place];  Service: General;  Laterality: N/A;  . testicle removed  1982       Home Medications    Prior to Admission medications   Medication Sig Start Date End Date Taking? Authorizing Provider  Banana Flakes (BANATROL PO) Place 1 packet into feeding tube daily as needed (diarrhea).   Yes [provider]  famotidine (PEPCID) 20 MG tablet Take 20 mg by mouth 2 (two) times daily. 03/09/17  Yes [provider]  ferrous  sulfate 325 (65 FE) MG tablet Take 325 mg by mouth daily with breakfast.   Yes [provider]  HYDROcodone-acetaminophen (NORCO/VICODIN) 5-325 MG tablet Take 1-2 tablets by mouth every 6 (six) hours as needed for moderate pain. 02/23/17  Yes Stark Klein, MD  Nutritional Supplements (NUTREN 1.5) LIQD Place 60 mLs into feeding tube every 12 (twelve) hours.    Yes  [provider]  vancomycin (VANCOCIN) 125 MG capsule Take 1 capsule (125 mg total) by mouth every 12 (twelve) hours. 03/15/17 05/14/17 Yes Brunetta Genera, MD  zolpidem (AMBIEN) 5 MG tablet Take 5 mg by mouth at bedtime as needed for sleep. 01/30/17  Yes [provider]  chlorproMAZINE (THORAZINE) 100 MG tablet Take 1 tablet (100 mg total) by mouth 4 (four) times daily as needed for hiccoughs. Patient not taking: Reported on 03/25/2017 01/27/17   Stark Klein, MD  dexamethasone (DECADRON) 4 MG tablet Take 2 tablets (8 mg total) by mouth daily. Start the day after chemotherapy for 2 days. Take with food. 03/15/17   Brunetta Genera, MD  lidocaine-prilocaine (EMLA) cream Apply to affected area once Patient taking differently: Apply 1 application topically See admin instructions. Apply to port access one hour prior to chemo 03/15/17   Brunetta Genera, MD  ondansetron (ZOFRAN) 8 MG tablet Take 1 tablet (8 mg total) by mouth 2 (two) times daily as needed for refractory nausea / vomiting. Start on day 3 after chemotherapy. 03/15/17   Brunetta Genera, MD  prochlorperazine (COMPAZINE) 10 MG tablet Take 1 tablet (10 mg total) by mouth every 6 (six) hours as needed (Nausea or vomiting). 03/15/17   Brunetta Genera, MD  vancomycin (VANCOCIN) 50 mg/mL oral solution Take 2.5 mLs (125 mg total) by mouth every 12 (twelve) hours. Patient not taking: Reported on 03/25/2017 03/15/17 04/05/17  Brunetta Genera, MD    Family History Family History  Problem Relation Age of Onset  . Prostate cancer Father   . Colon cancer Neg Hx     Social History Social History  Substance Use Topics  . Smoking status: Never Smoker  . Smokeless tobacco: Never Used  . Alcohol use No     Comment: occ beer or wine but none in last 6 months      Allergies   No known allergies   Review of Systems Review of Systems  Constitutional: Negative for fever.  Gastrointestinal:       +expelled feeding  tube     Physical Exam Updated Vital Signs BP 132/80   Pulse 96   Temp 97.6 F (36.4 C) (Oral)   Resp 17   Ht 5\' 11"  (1.803 m)   Wt 106.7 kg   SpO2 100%   BMI 32.81 kg/m   Physical Exam  Constitutional: He appears well-developed and well-nourished. No distress.  HENT:  Head: Normocephalic and atraumatic.  Eyes: Conjunctivae and EOM are normal. No scleral icterus.  Neck: Normal range of motion.  Pulmonary/Chest: Effort normal. No respiratory distress.  Abdominal:    Opening for feeding tube with no drainage present.  Neurological: He is alert.  Skin: No rash noted. He is not diaphoretic.  Psychiatric: He has a normal mood and affect.  Nursing note and vitals reviewed.    ED Treatments / Results   DIAGNOSTIC STUDIES: Oxygen Saturation is 99% on RA, normal by my interpretation.    COORDINATION OF CARE: 4:55 PM Discussed treatment plan with pt at bedside which includes feeding tube replacement and  pt agreed to plan.    Labs (all labs ordered are listed, but only abnormal results are displayed) Labs Reviewed  CBC WITH DIFFERENTIAL/PLATELET  COMPREHENSIVE METABOLIC PANEL  PROTIME-INR  BASIC METABOLIC PANEL    EKG  EKG Interpretation None       Radiology No results found.  Procedures Procedures (including critical care time)  Medications Ordered in ED Medications  vancomycin (VANCOCIN) 50 mg/mL oral solution 125 mg (not administered)  HYDROcodone-acetaminophen (NORCO/VICODIN) 5-325 MG per tablet 1-2 tablet (not administered)  zolpidem (AMBIEN) tablet 5 mg (not administered)  chlorproMAZINE (THORAZINE) tablet 100 mg (not administered)  ferrous sulfate tablet 325 mg (not administered)  sodium chloride flush (NS) 0.9 % injection 3 mL (not administered)  sodium chloride flush (NS) 0.9 % injection 3 mL (not administered)  0.9 %  sodium chloride infusion (not administered)  acetaminophen (TYLENOL) tablet 650 mg (not administered)    Or  acetaminophen  (TYLENOL) suppository 650 mg (not administered)  ondansetron (ZOFRAN) tablet 4 mg (not administered)    Or  ondansetron (ZOFRAN) injection 4 mg (not administered)  feeding supplement (ENSURE ENLIVE) (ENSURE ENLIVE) liquid 237 mL (not administered)  famotidine (PEPCID) IVPB 20 mg premix (not administered)     Initial Impression / Assessment and Plan / ED Course  I have reviewed the triage vital signs and the nursing notes.  Pertinent labs & imaging results that were available during my care of the patient were reviewed by me and considered in my medical decision making (see chart for details).     Patient presents to the ED for reinsertion of feeding tube that fell out approximately 4 hours PTA. Patient states that previously he has had to have the tube placed through interventional radiology. This is the third time that the tube has fallen out. Patient has no other complaints at this time. Attempted to reinsert tube here as advised by IR (Dr. Geroge Baseman). However, there was resistance with insertion. We were advised to have the patient admitted, per IR, and they will place the feeding tube in the morning. Spoke to hospitalist team who agreed to admit the patient.  Patient discussed with and seen by Dr. Zenia Resides.  Final Clinical Impressions(s) / ED Diagnoses   Final diagnoses:  None    New Prescriptions New Prescriptions   No medications on file    I personally performed the services described in this documentation, which was scribed in my presence. The recorded information has been reviewed and is accurate.    Delia Heady, PA-C 03/25/17 2043    Lacretia Leigh, MD 03/26/17 [place]

## 2017-03-25 NOTE — ED Triage Notes (Signed)
Pt has feeding tube for supplemental feeding.

## 2017-03-25 NOTE — Telephone Encounter (Signed)
He called stating that his feeding tube fell out.  He had a placed in February by his report.  He is in [place] currently.  I told him to go to 1 of the emergency room was there are immediately and have them replace the tube and so it in.  He seemed to understand this.

## 2017-03-25 NOTE — ED Notes (Signed)
Nurse currently starting IV 

## 2017-03-26 ENCOUNTER — Observation Stay (HOSPITAL_COMMUNITY): Payer: BLUE CROSS/BLUE SHIELD

## 2017-03-26 ENCOUNTER — Encounter (HOSPITAL_COMMUNITY): Payer: Self-pay | Admitting: Interventional Radiology

## 2017-03-26 DIAGNOSIS — Z789 Other specified health status: Secondary | ICD-10-CM | POA: Diagnosis not present

## 2017-03-26 HISTORY — PX: IR REPLC DUODEN/JEJUNO TUBE PERCUT W/FLUORO: IMG2334

## 2017-03-26 LAB — BASIC METABOLIC PANEL
Anion gap: 9 (ref 5–15)
BUN: 8 mg/dL (ref 6–20)
CO2: 27 mmol/L (ref 22–32)
Calcium: 9 mg/dL (ref 8.9–10.3)
Chloride: 101 mmol/L (ref 101–111)
Creatinine, Ser: 0.78 mg/dL (ref 0.61–1.24)
GFR calc Af Amer: >60 mL/min (ref >60)
GFR calc non Af Amer: >60 mL/min (ref >60)
Glucose, Bld: 104 mg/dL — ABNORMAL HIGH (ref 65–99)
Potassium: 4 mmol/L (ref 3.5–5.1)
Sodium: 137 mmol/L (ref 135–145)

## 2017-03-26 LAB — GLUCOSE, CAPILLARY: Glucose-Capillary: 121 mg/dL — ABNORMAL HIGH (ref 65–99)

## 2017-03-26 LAB — PROTIME-INR
INR: 1.13
Prothrombin Time: 14.6 seconds (ref 11.4–15.2)

## 2017-03-26 MED ORDER — LIDOCAINE VISCOUS 2 % MT SOLN
OROMUCOSAL | Status: DC | PRN
  Administered 2017-03-26: 15 mL via OROMUCOSAL

## 2017-03-26 MED ORDER — IOPAMIDOL (ISOVUE-300) INJECTION 61%
INTRAVENOUS | Status: AC
  Administered 2017-03-26: 15 mL
  Filled 2017-03-26: qty 50

## 2017-03-26 MED ORDER — LIDOCAINE HCL 1 % IJ SOLN
INTRAMUSCULAR | Status: AC
  Filled 2017-03-26: qty 20

## 2017-03-26 MED ORDER — LIDOCAINE VISCOUS 2 % MT SOLN
OROMUCOSAL | Status: AC
  Filled 2017-03-26: qty 15

## 2017-03-26 NOTE — Progress Notes (Addendum)
Patient was seen and examined at bedside. He is clinically stable and has no complaints.  I spoke with interventional radiology regarding insertion of feeding tube today morning.  Continue current medical and supportive care. Patient will likely discharge home today after the IR procedure. Discussed with the patient and patient's nurse.

## 2017-03-26 NOTE — Procedures (Signed)
Interventional Radiology Procedure Note  Procedure: Replacement of a 58F jejunostomy tube.  A 58F g-tube was used successfully.  Tube tip in the jejunum and ready for use.   Complications: None  Estimated Blood Loss: None  Recommendations: - Resume feeds  Signed,  Criselda Peaches, MD

## 2017-03-26 NOTE — Discharge Summary (Signed)
Physician Discharge Summary  [name] LZJ:[id number] DOB: 1951/02/14 DOA: 03/25/2017  PCP: [name], MD  Admit date: 03/25/2017 Discharge date: 03/26/2017  Admitted From:home Disposition:home  Recommendations for Outpatient Follow-up:  1. Follow up with PCP in 1-2 weeks 2.  Home Health:no Equipment/Devices:no Discharge Condition:stable CODE STATUS:full Diet recommendation:resume heart healthy and tube feed  Brief/Interim Summary: 66 y/o male with h/o gastric cancer s/p resection and has feeding tube presented after the tube fell out. Admitted for IR procedure. Discussed with IR. Underwent replacement of jejunostomy tube and ready to use. Advised to to continue oral diet and tube feed as before. Pt can take oral as well. He has no complaints and feels good. Stable to go home with pcp follow up.  Discharge Diagnoses:  Principal Problem:   Encounter for feeding tube placement Active Problems:   Gastric cancer ([place])   GERD (gastroesophageal reflux disease)   Anemia   History of Clostridium difficile colitis    Discharge Instructions  Discharge Instructions    Call MD for:  difficulty breathing, headache or visual disturbances    Complete by:  As directed    Call MD for:  extreme fatigue    Complete by:  As directed    Call MD for:  hives    Complete by:  As directed    Call MD for:  persistant dizziness or light-headedness    Complete by:  As directed    Call MD for:  persistant nausea and vomiting    Complete by:  As directed    Call MD for:  severe uncontrolled pain    Complete by:  As directed    Call MD for:  temperature >100.4    Complete by:  As directed    Diet - low sodium heart healthy    Complete by:  As directed    Resume tube feed   Increase activity slowly    Complete by:  As directed      Allergies as of 03/26/2017      Reactions   No Known Allergies       Medication List    TAKE these medications   BANATROL PO Place 1 packet into  feeding tube daily as needed (diarrhea).   chlorproMAZINE 100 MG tablet Commonly known as:  THORAZINE Take 1 tablet (100 mg total) by mouth 4 (four) times daily as needed for hiccoughs.   dexamethasone 4 MG tablet Commonly known as:  DECADRON Take 2 tablets (8 mg total) by mouth daily. Start the day after chemotherapy for 2 days. Take with food.   famotidine 20 MG tablet Commonly known as:  PEPCID Take 20 mg by mouth 2 (two) times daily.   ferrous sulfate 325 (65 FE) MG tablet Take 325 mg by mouth daily with breakfast.   HYDROcodone-acetaminophen 5-325 MG tablet Commonly known as:  NORCO/VICODIN Take 1-2 tablets by mouth every 6 (six) hours as needed for moderate pain.   lidocaine-prilocaine cream Commonly known as:  EMLA Apply to affected area once What changed:  how much to take  how to take this  when to take this  additional instructions   NUTREN 1.5 Liqd Place 60 mLs into feeding tube every 12 (twelve) hours.   ondansetron 8 MG tablet Commonly known as:  ZOFRAN Take 1 tablet (8 mg total) by mouth 2 (two) times daily as needed for refractory nausea / vomiting. Start on day 3 after chemotherapy.   prochlorperazine 10 MG tablet Commonly known as:  COMPAZINE Take  1 tablet (10 mg total) by mouth every 6 (six) hours as needed (Nausea or vomiting).   vancomycin 50 mg/mL oral solution Commonly known as:  VANCOCIN Take 2.5 mLs (125 mg total) by mouth every 12 (twelve) hours.   vancomycin 125 MG capsule Commonly known as:  VANCOCIN Take 1 capsule (125 mg total) by mouth every 12 (twelve) hours.   zolpidem 5 MG tablet Commonly known as:  AMBIEN Take 5 mg by mouth at bedtime as needed for sleep.      Follow-up Information    Osei-Bonsu, [name], MD. Schedule an appointment as soon as possible for a visit in 1 week(s).   Specialty:  Internal Medicine Contact information: [address] [place] [address] [phone]          Allergies   Allergen Reactions  . No Known Allergies     Consultations: ir  Procedures/Studies: Feeding tube replacement  Subjective: Feels good. Denied fever, chills, headache, dizziness, nausea, vomiting, abdomen pain. No diarrhea.  Discharge Exam: Vitals:   03/26/17 0623 03/26/17 1410  BP: 134/74 127/74  Pulse: 74 78  Resp: 18 18  Temp: 98.1 F (36.7 C) 97.9 F (36.6 C)   Vitals:   03/25/17 2001 03/25/17 2113 03/26/17 0623 03/26/17 1410  BP: 132/80 (!) 143/68 134/74 127/74  Pulse: 96 78 74 78  Resp: 17 17 18 18   Temp:  98.2 F (36.8 C) 98.1 F (36.7 C) 97.9 F (36.6 C)  TempSrc:  Oral  Oral  SpO2: 100% 99% 100% 100%  Weight:  106.7 kg (235 lb 4 oz)    Height:  5\' 11"  (1.803 m)      General: Pt is alert, awake, not in acute distress Cardiovascular: RRR, S1/S2 +, no rubs, no gallops Respiratory: CTA bilaterally, no wheezing, no rhonchi Abdominal: Soft, NT, ND, bowel sounds + Extremities: no edema, no cyanosis    The results of significant diagnostics from this hospitalization (including imaging, microbiology, ancillary and laboratory) are listed below for reference.     Microbiology: No results found for this or any previous visit (from the past 240 hour(s)).   Labs: BNP (last 3 results) No results for input(s): BNP in the last 8760 hours. Basic Metabolic Panel:  Recent Labs Lab 03/25/17 2039 03/26/17 0349  NA 134* 137  K 4.1 4.0  CL 100* 101  CO2 26 27  GLUCOSE 108* 104*  BUN 9 8  CREATININE 0.84 0.78  CALCIUM 9.2 9.0   Liver Function Tests:  Recent Labs Lab 03/25/17 2039  AST 19  ALT 17  ALKPHOS 84  BILITOT 0.6  PROT 7.3  ALBUMIN 3.0*   No results for input(s): LIPASE, AMYLASE in the last 168 hours. No results for input(s): AMMONIA in the last 168 hours. CBC:  Recent Labs Lab 03/25/17 2039  WBC 7.5  NEUTROABS 5.2  HGB 10.5*  HCT 34.0*  MCV 75.2*  PLT 348   Cardiac Enzymes: No results for input(s): CKTOTAL, CKMB, CKMBINDEX,  TROPONINI in the last 168 hours. BNP: Invalid input(s): POCBNP CBG:  Recent Labs Lab 03/26/17 0741  GLUCAP 121*   D-Dimer No results for input(s): DDIMER in the last 72 hours. Hgb A1c No results for input(s): HGBA1C in the last 72 hours. Lipid Profile No results for input(s): CHOL, HDL, LDLCALC, TRIG, CHOLHDL, LDLDIRECT in the last 72 hours. Thyroid function studies No results for input(s): TSH, T4TOTAL, T3FREE, THYROIDAB in the last 72 hours.  Invalid input(s): FREET3 Anemia work up No  results for input(s): VITAMINB12, FOLATE, FERRITIN, TIBC, IRON, RETICCTPCT in the last 72 hours. Urinalysis    Component Value Date/Time   COLORURINE AMBER (A) 01/09/2017 1357   APPEARANCEUR HAZY (A) 01/09/2017 1357   LABSPEC 1.035 (H) 01/09/2017 1357   PHURINE 5.0 01/09/2017 1357   GLUCOSEU NEGATIVE 01/09/2017 1357   HGBUR NEGATIVE 01/09/2017 1357   BILIRUBINUR SMALL (A) 01/09/2017 1357   KETONESUR 80 (A) 01/09/2017 1357   PROTEINUR 30 (A) 01/09/2017 1357   NITRITE NEGATIVE 01/09/2017 1357   LEUKOCYTESUR NEGATIVE 01/09/2017 1357   Sepsis Labs Invalid input(s): PROCALCITONIN,  WBC,  LACTICIDVEN Microbiology No results found for this or any previous visit (from the past 240 hour(s)).   Time coordinating discharge: 26 minutes  SIGNED:   Rosita Fire, MD  [facility] 03/26/2017, 3:19 PM  If 7PM-7AM, please contact night-coverage [email or web address] Password [email or web address]

## 2017-03-26 NOTE — Progress Notes (Signed)
Discharge paperwork given to patient. No questions verbalized. Patient is ready for discharge. 

## 2017-03-27 ENCOUNTER — Encounter: Payer: Self-pay | Admitting: Pharmacist

## 2017-03-29 ENCOUNTER — Other Ambulatory Visit (HOSPITAL_BASED_OUTPATIENT_CLINIC_OR_DEPARTMENT_OTHER): Payer: BLUE CROSS/BLUE SHIELD

## 2017-03-29 ENCOUNTER — Encounter: Payer: Self-pay | Admitting: *Deleted

## 2017-03-29 ENCOUNTER — Ambulatory Visit (HOSPITAL_BASED_OUTPATIENT_CLINIC_OR_DEPARTMENT_OTHER): Payer: BLUE CROSS/BLUE SHIELD

## 2017-03-29 ENCOUNTER — Ambulatory Visit: Payer: BLUE CROSS/BLUE SHIELD | Admitting: Nutrition

## 2017-03-29 VITALS — BP 125/71 | HR 87 | Temp 98.4°F | Resp 16

## 2017-03-29 DIAGNOSIS — C163 Malignant neoplasm of pyloric antrum: Secondary | ICD-10-CM

## 2017-03-29 DIAGNOSIS — C169 Malignant neoplasm of stomach, unspecified: Secondary | ICD-10-CM

## 2017-03-29 DIAGNOSIS — Z5111 Encounter for antineoplastic chemotherapy: Secondary | ICD-10-CM

## 2017-03-29 DIAGNOSIS — Z7189 Other specified counseling: Secondary | ICD-10-CM

## 2017-03-29 LAB — COMPREHENSIVE METABOLIC PANEL
ALT: 19 U/L (ref 0–55)
AST: 16 U/L (ref 5–34)
Albumin: 3.3 g/dL — ABNORMAL LOW (ref 3.5–5.0)
Alkaline Phosphatase: 94 U/L (ref 40–150)
Anion Gap: 9 mEq/L (ref 3–11)
BUN: 11.8 mg/dL (ref 7.0–26.0)
CO2: 27 mEq/L (ref 22–29)
Calcium: 9.8 mg/dL (ref 8.4–10.4)
Chloride: 104 mEq/L (ref 98–109)
Creatinine: 0.8 mg/dL (ref 0.7–1.3)
EGFR: >90 mL/min/{1.73_m2} (ref >90)
Glucose: 123 mg/dl (ref 70–140)
Potassium: 4.2 mEq/L (ref 3.5–5.1)
Sodium: 140 mEq/L (ref 136–145)
Total Bilirubin: 0.49 mg/dL (ref 0.20–1.20)
Total Protein: 8.1 g/dL (ref 6.4–8.3)

## 2017-03-29 LAB — CBC WITH DIFFERENTIAL/PLATELET
BASO%: 0.9 % (ref 0.0–2.0)
Basophils Absolute: 0.1 10*3/uL (ref 0.0–0.1)
EOS%: 1.4 % (ref 0.0–7.0)
Eosinophils Absolute: 0.1 10*3/uL (ref 0.0–0.5)
HCT: 34.5 % — ABNORMAL LOW (ref 38.4–49.9)
HGB: 11 g/dL — ABNORMAL LOW (ref 13.0–17.1)
LYMPH%: 21.6 % (ref 14.0–49.0)
MCH: 23.5 pg — ABNORMAL LOW (ref 27.2–33.4)
MCHC: 31.8 g/dL — ABNORMAL LOW (ref 32.0–36.0)
MCV: 74 fL — ABNORMAL LOW (ref 79.3–98.0)
MONO#: 0.6 10*3/uL (ref 0.1–0.9)
MONO%: 7.2 % (ref 0.0–14.0)
NEUT#: 5.6 10*3/uL (ref 1.5–6.5)
NEUT%: 68.9 % (ref 39.0–75.0)
Platelets: 401 10*3/uL — ABNORMAL HIGH (ref 140–400)
RBC: 4.67 10*6/uL (ref 4.20–5.82)
RDW: 22.8 % — ABNORMAL HIGH (ref 11.0–14.6)
WBC: 8.2 10*3/uL (ref 4.0–10.3)
lymph#: 1.8 10*3/uL (ref 0.9–3.3)

## 2017-03-29 MED ORDER — PALONOSETRON HCL INJECTION 0.25 MG/5ML
0.2500 mg | Freq: Once | INTRAVENOUS | Status: AC
  Administered 2017-03-29: 0.25 mg via INTRAVENOUS

## 2017-03-29 MED ORDER — DEXTROSE 5 % IV SOLN
85.0000 mg/m2 | Freq: Once | INTRAVENOUS | Status: AC
  Administered 2017-03-29: 200 mg via INTRAVENOUS
  Filled 2017-03-29: qty 40

## 2017-03-29 MED ORDER — FLUOROURACIL CHEMO INJECTION 2.5 GM/50ML
400.0000 mg/m2 | Freq: Once | INTRAVENOUS | Status: AC
  Administered 2017-03-29: 950 mg via INTRAVENOUS
  Filled 2017-03-29: qty 19

## 2017-03-29 MED ORDER — PALONOSETRON HCL INJECTION 0.25 MG/5ML
INTRAVENOUS | Status: AC
  Filled 2017-03-29: qty 5

## 2017-03-29 MED ORDER — DEXTROSE 5 % IV SOLN
Freq: Once | INTRAVENOUS | Status: AC
  Administered 2017-03-29: 13:00:00 via INTRAVENOUS

## 2017-03-29 MED ORDER — SODIUM CHLORIDE 0.9 % IV SOLN
2400.0000 mg/m2 | INTRAVENOUS | Status: DC
  Administered 2017-03-29: 5600 mg via INTRAVENOUS
  Filled 2017-03-29: qty 112

## 2017-03-29 MED ORDER — DEXAMETHASONE SODIUM PHOSPHATE 10 MG/ML IJ SOLN
INTRAMUSCULAR | Status: AC
  Filled 2017-03-29: qty 1

## 2017-03-29 MED ORDER — LEUCOVORIN CALCIUM INJECTION 350 MG
400.0000 mg/m2 | Freq: Once | INTRAMUSCULAR | Status: AC
  Administered 2017-03-29: 936 mg via INTRAVENOUS
  Filled 2017-03-29: qty 46.8

## 2017-03-29 MED ORDER — DEXAMETHASONE SODIUM PHOSPHATE 10 MG/ML IJ SOLN
10.0000 mg | Freq: Once | INTRAMUSCULAR | Status: AC
  Administered 2017-03-29: 10 mg via INTRAVENOUS

## 2017-03-29 NOTE — Progress Notes (Signed)
Nutrition follow-up completed with patient receiving chemotherapy for gastric cancer. Patient reports he recently had his jejunostomy feeding tube replaced. He is tolerating Nutren 1.5 via jejunostomy at 60 mL an hour for 12 hours providing 1080 cal, 51 g protein, 573 mL free water. Weight is decreased and documented as 235 pounds on May 12, down from 241 pounds May 1. Patient reports his appetite has gradually improved.  He feels like he is eating more. Patient denies nutrition impact symptoms.  Nutrition diagnosis: Inadequate oral intake continues.  Intervention: Educated patient to continue to work to increase oral intake with high-calorie-protein foods. I encouraged him to continue his food journal so that he can see his oral intake progress. Will continue Nutren 1.5 at 60 mL an hour for 12 hours and evaluate weight next visit. Teach back method used.  Monitoring, evaluation, goals:  Patient will tolerate tube feedings plus oral intake to minimize weight loss and meet greater than 90% of estimated nutrition needs.  Next visit: Wednesday, May 30, during infusion.  **Disclaimer: This note was dictated with voice recognition software. Similar sounding words can inadvertently be transcribed and this note may contain transcription errors which may not have been corrected upon publication of note.**

## 2017-03-29 NOTE — Patient Instructions (Signed)
[facility] Discharge Instructions for Patients Receiving Chemotherapy  Today you received the following chemotherapy agents: Oxaliplatin, Leucovorin, Adrucil  To help prevent nausea and vomiting after your treatment, we encourage you to take your nausea medication as prescribed.   If you develop nausea and vomiting that is not controlled by your nausea medication, call the clinic.   BELOW ARE SYMPTOMS THAT SHOULD BE REPORTED IMMEDIATELY:  *FEVER GREATER THAN 100.5 F  *CHILLS WITH OR WITHOUT FEVER  NAUSEA AND VOMITING THAT IS NOT CONTROLLED WITH YOUR NAUSEA MEDICATION  *UNUSUAL SHORTNESS OF BREATH  *UNUSUAL BRUISING OR BLEEDING  TENDERNESS IN MOUTH AND THROAT WITH OR WITHOUT PRESENCE OF ULCERS  *URINARY PROBLEMS  *BOWEL PROBLEMS  UNUSUAL RASH Items with * indicate a potential emergency and should be followed up as soon as possible.  Feel free to call the clinic you have any questions or concerns. The clinic phone number is [phone].  Please show the [facility] at check-in to the Emergency Department and triage nurse.  Oxaliplatin Injection What is this medicine? OXALIPLATIN (ox AL i PLA tin) is a chemotherapy drug. It targets fast dividing cells, like cancer cells, and causes these cells to die. This medicine is used to treat cancers of the colon and rectum, and many other cancers. This medicine may be used for other purposes; ask your health care provider or pharmacist if you have questions. COMMON BRAND NAME(S): Eloxatin What should I tell my health care provider before I take this medicine? They need to know if you have any of these conditions: -kidney disease -an unusual or allergic reaction to oxaliplatin, other chemotherapy, other medicines, foods, dyes, or preservatives -pregnant or trying to get pregnant -breast-feeding How should I use this medicine? This drug is given as an infusion into a vein. It is administered in a hospital or  clinic by a specially trained health care professional. Talk to your pediatrician regarding the use of this medicine in children. Special care may be needed. Overdosage: If you think you have taken too much of this medicine contact a poison control center or emergency room at once. NOTE: This medicine is only for you. Do not share this medicine with others. What if I miss a dose? It is important not to miss a dose. Call your doctor or health care professional if you are unable to keep an appointment. What may interact with this medicine? -medicines to increase blood counts like filgrastim, pegfilgrastim, sargramostim -probenecid -some antibiotics like amikacin, gentamicin, neomycin, polymyxin B, streptomycin, tobramycin -zalcitabine Talk to your doctor or health care professional before taking any of these medicines: -acetaminophen -aspirin -ibuprofen -ketoprofen -naproxen This list may not describe all possible interactions. Give your health care provider a list of all the medicines, herbs, non-prescription drugs, or dietary supplements you use. Also tell them if you smoke, drink alcohol, or use illegal drugs. Some items may interact with your medicine. What should I watch for while using this medicine? Your condition will be monitored carefully while you are receiving this medicine. You will need important blood work done while you are taking this medicine. This medicine can make you more sensitive to cold. Do not drink cold drinks or use ice. Cover exposed skin before coming in contact with cold temperatures or cold objects. When out in cold weather wear warm clothing and cover your mouth and nose to warm the air that goes into your lungs. Tell your doctor if you get sensitive to the cold. This drug  cold. This drug may make you feel generally unwell. This is not uncommon, as chemotherapy can affect healthy cells as well as cancer cells. Report any side effects. Continue your course of treatment even though  you feel ill unless your doctor tells you to stop. In some cases, you may be given additional medicines to help with side effects. Follow all directions for their use. Call your doctor or health care professional for advice if you get a fever, chills or sore throat, or other symptoms of a cold or flu. Do not treat yourself. This drug decreases your body's ability to fight infections. Try to avoid being around people who are sick. This medicine may increase your risk to bruise or bleed. Call your doctor or health care professional if you notice any unusual bleeding. Be careful brushing and flossing your teeth or using a toothpick because you may get an infection or bleed more easily. If you have any dental work done, tell your dentist you are receiving this medicine. Avoid taking products that contain aspirin, acetaminophen, ibuprofen, naproxen, or ketoprofen unless instructed by your doctor. These medicines may hide a fever. Do not become pregnant while taking this medicine. Women should inform their doctor if they wish to become pregnant or think they might be pregnant. There is a potential for serious side effects to an unborn child. Talk to your health care professional or pharmacist for more information. Do not breast-feed an infant while taking this medicine. Call your doctor or health care professional if you get diarrhea. Do not treat yourself. What side effects may I notice from receiving this medicine? Side effects that you should report to your doctor or health care professional as soon as possible: -allergic reactions like skin rash, itching or hives, swelling of the face, lips, or tongue -low blood counts - This drug may decrease the number of white blood cells, red blood cells and platelets. You may be at increased risk for infections and bleeding. -signs of infection - fever or chills, cough, sore throat, pain or difficulty passing urine -signs of decreased platelets or bleeding -  bruising, pinpoint red spots on the skin, black, tarry stools, nosebleeds -signs of decreased red blood cells - unusually weak or tired, fainting spells, lightheadedness -breathing problems -chest pain, pressure -cough -diarrhea -jaw tightness -mouth sores -nausea and vomiting -pain, swelling, redness or irritation at the injection site -pain, tingling, numbness in the hands or feet -problems with balance, talking, walking -redness, blistering, peeling or loosening of the skin, including inside the mouth -trouble passing urine or change in the amount of urine Side effects that usually do not require medical attention (report to your doctor or health care professional if they continue or are bothersome): -changes in vision -constipation -hair loss -loss of appetite -metallic taste in the mouth or changes in taste -stomach pain This list may not describe all possible side effects. Call your doctor for medical advice about side effects. You may report side effects to FDA at 1-800-FDA-1088. Where should I keep my medicine? This drug is given in a hospital or clinic and will not be stored at home. NOTE: This sheet is a summary. It may not cover all possible information. If you have questions about this medicine, talk to your doctor, pharmacist, or health care provider.  2018 Elsevier/Gold Standard (2008-05-27 17:22:47) Leucovorin injection What is this medicine? LEUCOVORIN (loo koe VOR in) is used to prevent or treat the harmful effects of some medicines. This medicine is used to treat   anemia caused by a low amount of folic acid in the body. It is also used with 5-fluorouracil (5-FU) to treat colon cancer. This medicine may be used for other purposes; ask your health care provider or pharmacist if you have questions. What should I tell my health care provider before I take this medicine? They need to know if you have any of these conditions: -anemia from low levels of vitamin B-12 in the  blood -an unusual or allergic reaction to leucovorin, folic acid, other medicines, foods, dyes, or preservatives -pregnant or trying to get pregnant -breast-feeding How should I use this medicine? This medicine is for injection into a muscle or into a vein. It is given by a health care professional in a hospital or clinic setting. Talk to your pediatrician regarding the use of this medicine in children. Special care may be needed. Overdosage: If you think you have taken too much of this medicine contact a poison control center or emergency room at once. NOTE: This medicine is only for you. Do not share this medicine with others. What if I miss a dose? This does not apply. What may interact with this medicine? -capecitabine -fluorouracil -phenobarbital -phenytoin -primidone -trimethoprim-sulfamethoxazole This list may not describe all possible interactions. Give your health care provider a list of all the medicines, herbs, non-prescription drugs, or dietary supplements you use. Also tell them if you smoke, drink alcohol, or use illegal drugs. Some items may interact with your medicine. What should I watch for while using this medicine? Your condition will be monitored carefully while you are receiving this medicine. This medicine may increase the side effects of 5-fluorouracil, 5-FU. Tell your doctor or health care professional if you have diarrhea or mouth sores that do not get better or that get worse. What side effects may I notice from receiving this medicine? Side effects that you should report to your doctor or health care professional as soon as possible: -allergic reactions like skin rash, itching or hives, swelling of the face, lips, or tongue -breathing problems -fever, infection -mouth sores -unusual bleeding or bruising -unusually weak or tired Side effects that usually do not require medical attention (report to your doctor or health care professional if they continue or are  bothersome): -constipation or diarrhea -loss of appetite -nausea, vomiting This list may not describe all possible side effects. Call your doctor for medical advice about side effects. You may report side effects to FDA at 1-800-FDA-1088. Where should I keep my medicine? This drug is given in a hospital or clinic and will not be stored at home. NOTE: This sheet is a summary. It may not cover all possible information. If you have questions about this medicine, talk to your doctor, pharmacist, or health care provider.  2018 Elsevier/Gold Standard (2008-05-06 16:50:29) Fluorouracil, 5-FU injection What is this medicine? FLUOROURACIL, 5-FU (flure oh YOOR a sil) is a chemotherapy drug. It slows the growth of cancer cells. This medicine is used to treat many types of cancer like breast cancer, colon or rectal cancer, pancreatic cancer, and stomach cancer. This medicine may be used for other purposes; ask your health care provider or pharmacist if you have questions. COMMON BRAND NAME(S): Adrucil What should I tell my health care provider before I take this medicine? They need to know if you have any of these conditions: -blood disorders -dihydropyrimidine dehydrogenase (DPD) deficiency -infection (especially a virus infection such as chickenpox, cold sores, or herpes) -kidney disease -liver disease -malnourished, poor nutrition -recent or ongoing   radiation therapy -an unusual or allergic reaction to fluorouracil, other chemotherapy, other medicines, foods, dyes, or preservatives -pregnant or trying to get pregnant -breast-feeding How should I use this medicine? This drug is given as an infusion or injection into a vein. It is administered in a hospital or clinic by a specially trained health care professional. Talk to your pediatrician regarding the use of this medicine in children. Special care may be needed. Overdosage: If you think you have taken too much of this medicine contact a poison  control center or emergency room at once. NOTE: This medicine is only for you. Do not share this medicine with others. What if I miss a dose? It is important not to miss your dose. Call your doctor or health care professional if you are unable to keep an appointment. What may interact with this medicine? -allopurinol -cimetidine -dapsone -digoxin -hydroxyurea -leucovorin -levamisole -medicines for seizures like ethotoin, fosphenytoin, phenytoin -medicines to increase blood counts like filgrastim, pegfilgrastim, sargramostim -medicines that treat or prevent blood clots like warfarin, enoxaparin, and dalteparin -methotrexate -metronidazole -pyrimethamine -some other chemotherapy drugs like busulfan, cisplatin, estramustine, vinblastine -trimethoprim -trimetrexate -vaccines Talk to your doctor or health care professional before taking any of these medicines: -acetaminophen -aspirin -ibuprofen -ketoprofen -naproxen This list may not describe all possible interactions. Give your health care provider a list of all the medicines, herbs, non-prescription drugs, or dietary supplements you use. Also tell them if you smoke, drink alcohol, or use illegal drugs. Some items may interact with your medicine. What should I watch for while using this medicine? Visit your doctor for checks on your progress. This drug may make you feel generally unwell. This is not uncommon, as chemotherapy can affect healthy cells as well as cancer cells. Report any side effects. Continue your course of treatment even though you feel ill unless your doctor tells you to stop. In some cases, you may be given additional medicines to help with side effects. Follow all directions for their use. Call your doctor or health care professional for advice if you get a fever, chills or sore throat, or other symptoms of a cold or flu. Do not treat yourself. This drug decreases your body's ability to fight infections. Try to avoid  being around people who are sick. This medicine may increase your risk to bruise or bleed. Call your doctor or health care professional if you notice any unusual bleeding. Be careful brushing and flossing your teeth or using a toothpick because you may get an infection or bleed more easily. If you have any dental work done, tell your dentist you are receiving this medicine. Avoid taking products that contain aspirin, acetaminophen, ibuprofen, naproxen, or ketoprofen unless instructed by your doctor. These medicines may hide a fever. Do not become pregnant while taking this medicine. Women should inform their doctor if they wish to become pregnant or think they might be pregnant. There is a potential for serious side effects to an unborn child. Talk to your health care professional or pharmacist for more information. Do not breast-feed an infant while taking this medicine. Men should inform their doctor if they wish to father a child. This medicine may lower sperm counts. Do not treat diarrhea with over the counter products. Contact your doctor if you have diarrhea that lasts more than 2 days or if it is severe and watery. This medicine can make you more sensitive to the sun. Keep out of the sun. If you cannot avoid being in the sun, wear   use sunscreen. Do not use sun lamps or tanning beds/booths. What side effects may I notice from receiving this medicine? Side effects that you should report to your doctor or health care professional as soon as possible: -allergic reactions like skin rash, itching or hives, swelling of the face, lips, or tongue -low blood counts - this medicine may decrease the number of white blood cells, red blood cells and platelets. You may be at increased risk for infections and bleeding. -signs of infection - fever or chills, cough, sore throat, pain or difficulty passing urine -signs of decreased platelets or bleeding - bruising, pinpoint red spots on the  skin, black, tarry stools, blood in the urine -signs of decreased red blood cells - unusually weak or tired, fainting spells, lightheadedness -breathing problems -changes in vision -chest pain -mouth sores -nausea and vomiting -pain, swelling, redness at site where injected -pain, tingling, numbness in the hands or feet -redness, swelling, or sores on hands or feet -stomach pain -unusual bleeding Side effects that usually do not require medical attention (report to your doctor or health care professional if they continue or are bothersome): -changes in finger or toe nails -diarrhea -dry or itchy skin -hair loss -headache -loss of appetite -sensitivity of eyes to the light -stomach upset -unusually teary eyes This list may not describe all possible side effects. Call your doctor for medical advice about side effects. You may report side effects to FDA at 1-800-FDA-1088. Where should I keep my medicine? This drug is given in a hospital or clinic and will not be stored at home. NOTE: This sheet is a summary. It may not cover all possible information. If you have questions about this medicine, talk to your doctor, pharmacist, or health care provider.  2018 Elsevier/Gold Standard (2008-03-05 13:53:16)

## 2017-03-31 ENCOUNTER — Telehealth: Payer: Self-pay | Admitting: Hematology

## 2017-03-31 ENCOUNTER — Ambulatory Visit (HOSPITAL_BASED_OUTPATIENT_CLINIC_OR_DEPARTMENT_OTHER): Payer: BLUE CROSS/BLUE SHIELD

## 2017-03-31 VITALS — BP 119/53 | HR 79 | Temp 97.9°F | Resp 18

## 2017-03-31 DIAGNOSIS — C163 Malignant neoplasm of pyloric antrum: Secondary | ICD-10-CM

## 2017-03-31 DIAGNOSIS — Z7189 Other specified counseling: Secondary | ICD-10-CM

## 2017-03-31 MED ORDER — SODIUM CHLORIDE 0.9% FLUSH
10.0000 mL | INTRAVENOUS | Status: DC | PRN
  Administered 2017-03-31: 10 mL
  Filled 2017-03-31: qty 10

## 2017-03-31 MED ORDER — HEPARIN SOD (PORK) LOCK FLUSH 100 UNIT/ML IV SOLN
500.0000 [IU] | Freq: Once | INTRAVENOUS | Status: AC
  Administered 2017-03-31: 500 [IU] via INTRAVENOUS
  Filled 2017-03-31: qty 5

## 2017-03-31 NOTE — Patient Instructions (Signed)
Implanted Port Home Guide An implanted port is a type of central line that is placed under the skin. Central lines are used to provide IV access when treatment or nutrition needs to be given through a person's veins. Implanted ports are used for long-term IV access. An implanted port may be placed because:  You need IV medicine that would be irritating to the small veins in your hands or arms.  You need long-term IV medicines, such as antibiotics.  You need IV nutrition for a long period.  You need frequent blood draws for lab tests.  You need dialysis.  Implanted ports are usually placed in the chest area, but they can also be placed in the upper arm, the abdomen, or the leg. An implanted port has two main parts:  Reservoir. The reservoir is round and will appear as a small, raised area under your skin. The reservoir is the part where a needle is inserted to give medicines or draw blood.  Catheter. The catheter is a thin, flexible tube that extends from the reservoir. The catheter is placed into a large vein. Medicine that is inserted into the reservoir goes into the catheter and then into the vein.  How will I care for my incision site? Do not get the incision site wet. Bathe or shower as directed by your health care provider. How is my port accessed? Special steps must be taken to access the port:  Before the port is accessed, a numbing cream can be placed on the skin. This helps numb the skin over the port site.  Your health care provider uses a sterile technique to access the port. ? Your health care provider must put on a mask and sterile gloves. ? The skin over your port is cleaned carefully with an antiseptic and allowed to dry. ? The port is gently pinched between sterile gloves, and a needle is inserted into the port.  Only "non-coring" port needles should be used to access the port. Once the port is accessed, a blood return should be checked. This helps ensure that the port  is in the vein and is not clogged.  If your port needs to remain accessed for a constant infusion, a clear (transparent) bandage will be placed over the needle site. The bandage and needle will need to be changed every week, or as directed by your health care provider.  Keep the bandage covering the needle clean and dry. Do not get it wet. Follow your health care provider's instructions on how to take a shower or bath while the port is accessed.  If your port does not need to stay accessed, no bandage is needed over the port.  What is flushing? Flushing helps keep the port from getting clogged. Follow your health care provider's instructions on how and when to flush the port. Ports are usually flushed with saline solution or a medicine called heparin. The need for flushing will depend on how the port is used.  If the port is used for intermittent medicines or blood draws, the port will need to be flushed: ? After medicines have been given. ? After blood has been drawn. ? As part of routine maintenance.  If a constant infusion is running, the port may not need to be flushed.  How long will my port stay implanted? The port can stay in for as long as your health care provider thinks it is needed. When it is time for the port to come out, surgery will be   done to remove it. The procedure is similar to the one performed when the port was put in. When should I seek immediate medical care? When you have an implanted port, you should seek immediate medical care if:  You notice a bad smell coming from the incision site.  You have swelling, redness, or drainage at the incision site.  You have more swelling or pain at the port site or the surrounding area.  You have a fever that is not controlled with medicine.  This information is not intended to replace advice given to you by your health care provider. Make sure you discuss any questions you have with your health care provider. Document  Released: 10/31/2005 Document Revised: 04/07/2016 Document Reviewed: 07/08/2013 Elsevier Interactive Patient Education  2017 [employer].  

## 2017-03-31 NOTE — Telephone Encounter (Signed)
Scheduled appt per sch message from Paulita Fujita 5/17 - left message with appt date and time.

## 2017-04-12 ENCOUNTER — Telehealth: Payer: Self-pay | Admitting: Hematology

## 2017-04-12 ENCOUNTER — Other Ambulatory Visit (HOSPITAL_BASED_OUTPATIENT_CLINIC_OR_DEPARTMENT_OTHER): Payer: BLUE CROSS/BLUE SHIELD

## 2017-04-12 ENCOUNTER — Ambulatory Visit (HOSPITAL_BASED_OUTPATIENT_CLINIC_OR_DEPARTMENT_OTHER): Payer: BLUE CROSS/BLUE SHIELD

## 2017-04-12 ENCOUNTER — Ambulatory Visit: Payer: BLUE CROSS/BLUE SHIELD | Admitting: Nutrition

## 2017-04-12 ENCOUNTER — Ambulatory Visit (HOSPITAL_BASED_OUTPATIENT_CLINIC_OR_DEPARTMENT_OTHER): Payer: BLUE CROSS/BLUE SHIELD | Admitting: Hematology

## 2017-04-12 VITALS — BP 120/66 | HR 82 | Temp 98.3°F | Resp 18 | Wt 244.3 lb

## 2017-04-12 DIAGNOSIS — C169 Malignant neoplasm of stomach, unspecified: Secondary | ICD-10-CM

## 2017-04-12 DIAGNOSIS — A0472 Enterocolitis due to Clostridium difficile, not specified as recurrent: Secondary | ICD-10-CM

## 2017-04-12 DIAGNOSIS — Z7189 Other specified counseling: Secondary | ICD-10-CM

## 2017-04-12 DIAGNOSIS — C163 Malignant neoplasm of pyloric antrum: Secondary | ICD-10-CM | POA: Diagnosis not present

## 2017-04-12 DIAGNOSIS — Z5111 Encounter for antineoplastic chemotherapy: Secondary | ICD-10-CM | POA: Diagnosis not present

## 2017-04-12 DIAGNOSIS — E440 Moderate protein-calorie malnutrition: Secondary | ICD-10-CM

## 2017-04-12 LAB — COMPREHENSIVE METABOLIC PANEL
ALT: 19 U/L (ref 0–55)
AST: 16 U/L (ref 5–34)
Albumin: 3.4 g/dL — ABNORMAL LOW (ref 3.5–5.0)
Alkaline Phosphatase: 94 U/L (ref 40–150)
Anion Gap: 10 mEq/L (ref 3–11)
BUN: 10.5 mg/dL (ref 7.0–26.0)
CO2: 28 mEq/L (ref 22–29)
Calcium: 9.7 mg/dL (ref 8.4–10.4)
Chloride: 104 mEq/L (ref 98–109)
Creatinine: 0.8 mg/dL (ref 0.7–1.3)
EGFR: >90 mL/min/{1.73_m2} (ref >90)
Glucose: 128 mg/dl (ref 70–140)
Potassium: 4.2 mEq/L (ref 3.5–5.1)
Sodium: 142 mEq/L (ref 136–145)
Total Bilirubin: 0.82 mg/dL (ref 0.20–1.20)
Total Protein: 7.5 g/dL (ref 6.4–8.3)

## 2017-04-12 LAB — CBC WITH DIFFERENTIAL/PLATELET
BASO%: 0.7 % (ref 0.0–2.0)
Basophils Absolute: 0 10*3/uL (ref 0.0–0.1)
EOS%: 2.6 % (ref 0.0–7.0)
Eosinophils Absolute: 0.1 10*3/uL (ref 0.0–0.5)
HCT: 33.4 % — ABNORMAL LOW (ref 38.4–49.9)
HGB: 10.3 g/dL — ABNORMAL LOW (ref 13.0–17.1)
LYMPH%: 22 % (ref 14.0–49.0)
MCH: 23.6 pg — ABNORMAL LOW (ref 27.2–33.4)
MCHC: 30.8 g/dL — ABNORMAL LOW (ref 32.0–36.0)
MCV: 76.4 fL — ABNORMAL LOW (ref 79.3–98.0)
MONO#: 0.4 10*3/uL (ref 0.1–0.9)
MONO%: 8.8 % (ref 0.0–14.0)
NEUT#: 2.8 10*3/uL (ref 1.5–6.5)
NEUT%: 65.9 % (ref 39.0–75.0)
Platelets: 232 10*3/uL (ref 140–400)
RBC: 4.37 10*6/uL (ref 4.20–5.82)
RDW: 21.5 % — ABNORMAL HIGH (ref 11.0–14.6)
WBC: 4.2 10*3/uL (ref 4.0–10.3)
lymph#: 0.9 10*3/uL (ref 0.9–3.3)
nRBC: 0 % (ref 0–0)

## 2017-04-12 MED ORDER — DEXAMETHASONE SODIUM PHOSPHATE 10 MG/ML IJ SOLN
INTRAMUSCULAR | Status: AC
  Filled 2017-04-12: qty 1

## 2017-04-12 MED ORDER — LEUCOVORIN CALCIUM INJECTION 350 MG
400.0000 mg/m2 | Freq: Once | INTRAVENOUS | Status: AC
  Administered 2017-04-12: 936 mg via INTRAVENOUS
  Filled 2017-04-12: qty 46.8

## 2017-04-12 MED ORDER — FLUOROURACIL CHEMO INJECTION 2.5 GM/50ML
400.0000 mg/m2 | Freq: Once | INTRAVENOUS | Status: AC
  Administered 2017-04-12: 950 mg via INTRAVENOUS
  Filled 2017-04-12: qty 19

## 2017-04-12 MED ORDER — DEXAMETHASONE SODIUM PHOSPHATE 10 MG/ML IJ SOLN
10.0000 mg | Freq: Once | INTRAMUSCULAR | Status: AC
  Administered 2017-04-12: 10 mg via INTRAVENOUS

## 2017-04-12 MED ORDER — OXALIPLATIN CHEMO INJECTION 100 MG/20ML
85.0000 mg/m2 | Freq: Once | INTRAVENOUS | Status: AC
  Administered 2017-04-12: 200 mg via INTRAVENOUS
  Filled 2017-04-12: qty 40

## 2017-04-12 MED ORDER — TRAZODONE HCL 50 MG PO TABS: 50.0000 mg | ORAL_TABLET | Freq: Every evening | ORAL | 0 refills | Status: DC | PRN

## 2017-04-12 MED ORDER — SODIUM CHLORIDE 0.9% FLUSH
10.0000 mL | INTRAVENOUS | Status: DC | PRN
  Filled 2017-04-12: qty 10

## 2017-04-12 MED ORDER — HYDROCODONE-ACETAMINOPHEN 5-325 MG PO TABS: 1.0000 | ORAL_TABLET | Freq: Four times a day (QID) | ORAL | 0 refills | Status: DC | PRN

## 2017-04-12 MED ORDER — PALONOSETRON HCL INJECTION 0.25 MG/5ML
INTRAVENOUS | Status: AC
  Filled 2017-04-12: qty 5

## 2017-04-12 MED ORDER — HEPARIN SOD (PORK) LOCK FLUSH 100 UNIT/ML IV SOLN
500.0000 [IU] | Freq: Once | INTRAVENOUS | Status: DC | PRN
  Filled 2017-04-12: qty 5

## 2017-04-12 MED ORDER — PALONOSETRON HCL INJECTION 0.25 MG/5ML
0.2500 mg | Freq: Once | INTRAVENOUS | Status: AC
Start: 2017-04-12 — End: 2017-04-12
  Administered 2017-04-12: 0.25 mg via INTRAVENOUS

## 2017-04-12 MED ORDER — SODIUM CHLORIDE 0.9 % IV SOLN
2400.0000 mg/m2 | INTRAVENOUS | Status: DC
  Administered 2017-04-12: 5600 mg via INTRAVENOUS
  Filled 2017-04-12: qty 112

## 2017-04-12 MED ORDER — DEXTROSE 5 % IV SOLN
Freq: Once | INTRAVENOUS | Status: AC
  Administered 2017-04-12: 13:00:00 via INTRAVENOUS

## 2017-04-12 NOTE — Patient Instructions (Signed)
May use Bacitracin or Vaseline Ointment around the feeding tube exit site to help with forming a skin barrier.

## 2017-04-12 NOTE — Telephone Encounter (Signed)
Scheduled appt per 5/30 LOS - patient aware to stop by scheduling on 6/1

## 2017-04-12 NOTE — Progress Notes (Signed)
Nutrition follow-up completed with patient during infusion for gastric cancer. Patient reports appetite has improved.  He feels like he is eating more. Weight improved documented as 244.3 pounds May 30 increased from 235 pounds May 12. Patient continues Nutren 1.5 via jejunostomy at 60 mL an hour for 12 hours providing 1080 cal, 51 g protein, 573 mL free water. Patient complains of taste alterations. He denies nausea, vomiting, mouth sores.  Nutrition diagnosis: Inadequate oral intake improved.  Intervention: Educated patient to continue Nutren 1.5 at 60 mL an hour for 12 hours. Educated patient on strategies for improving taste alterations and provided fact sheet. Questions were answered.  Teach back method used.  Contact information provided.  Monitoring, evaluation, goals:  Patient will tolerate tube feedings plus oral intake to minimize weight loss and meet greater than 90% of estimated nutrition needs.  Next visit: Wednesday, June 20, during infusion.  **Disclaimer: This note was dictated with voice recognition software. Similar sounding words can inadvertently be transcribed and this note may contain transcription errors which may not have been corrected upon publication of note.**

## 2017-04-13 ENCOUNTER — Telehealth: Payer: Self-pay | Admitting: *Deleted

## 2017-04-13 MED ORDER — TRAZODONE HCL 50 MG PO TABS: 50.0000 mg | ORAL_TABLET | Freq: Every evening | ORAL | 0 refills | Status: DC | PRN

## 2017-04-13 NOTE — Telephone Encounter (Signed)
"  I need to know what Dr. Irene Limbo told me to get OTC for my G-tube?  I also need to make sure my prescriptions are sent to the correct pharmacy.  I now live in [place], [place].  Can you tell me why I have an appointment June 20 th.  Is the chemotherapy every two weeks or weekly?"  Bacitracin or Vaseline is what patient needs to use for the G-tube barrier.  Corrected preferred pharmacy list and Pharmacy associated with the Tramadol order.  Confirmed chemotherapy is every two weeks and the June 20 th appointment has already been cancelled.  No  Further questions.

## 2017-04-14 ENCOUNTER — Ambulatory Visit (HOSPITAL_BASED_OUTPATIENT_CLINIC_OR_DEPARTMENT_OTHER): Payer: BLUE CROSS/BLUE SHIELD

## 2017-04-14 VITALS — BP 117/66 | HR 77 | Temp 97.6°F | Resp 18

## 2017-04-14 DIAGNOSIS — Z95828 Presence of other vascular implants and grafts: Secondary | ICD-10-CM

## 2017-04-14 DIAGNOSIS — Z452 Encounter for adjustment and management of vascular access device: Secondary | ICD-10-CM

## 2017-04-14 DIAGNOSIS — C169 Malignant neoplasm of stomach, unspecified: Secondary | ICD-10-CM | POA: Diagnosis not present

## 2017-04-14 MED ORDER — HEPARIN SOD (PORK) LOCK FLUSH 100 UNIT/ML IV SOLN
500.0000 [IU] | Freq: Once | INTRAVENOUS | Status: AC
  Administered 2017-04-14: 500 [IU] via INTRAVENOUS
  Filled 2017-04-14: qty 5

## 2017-04-14 MED ORDER — SODIUM CHLORIDE 0.9% FLUSH
10.0000 mL | INTRAVENOUS | Status: DC | PRN
  Administered 2017-04-14: 10 mL via INTRAVENOUS
  Filled 2017-04-14: qty 10

## 2017-04-14 NOTE — Patient Instructions (Signed)

## 2017-04-16 NOTE — Progress Notes (Signed)
HEMATOLOGY/ONCOLOGY CONSULTATION NOTE  Date of Service: .04/12/2017  Patient Care Team: Steven Mccreedy, MD as PCP - General (Internal Medicine) [name] (Surgery)   CHIEF COMPLAINTS/PURPOSE OF CONSULTATION:   f/u for gastric Adenocarcinoma.  HISTORY OF PRESENTING ILLNESS:   [name] is a wonderful 66 y.o. male is here for continued evaluation and management of recently diagnosed pT4a, pN3b, pM1 Gastric Adenocarcinoma.  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 Patient had presented to primary care physician with dyspeptic symptoms, partial gastric outlet obstruction and weight loss of 60 pounds. Patient has had a distal gastrectomy, cholecystectomy with jejunal feeding tube placement by Dr. Barry Dienes on 01/10/2017 for gastric obstruction secondary to adenocarcinoma of the gastric pylorus. He was noted to have invaded into the pancreas and a bit of the anterior pancreatic head was resected en bloc with the tumor.  Pathology showed  " INVASIVE MODERATELY TO POORLY DIFFERENTIATED ADENOCARCINOMA, SPANNING 4 CM IN GREATEST DIMENSION. TUMOR INVADES THROUGH STOMACH WALL TO INVOLVE SEROSAL SURFACE.  LYMPH/VASCULAR INVASION IS IDENTIFIED.  MARGINS ARE NEGATIVE.- FIFTEEN OF THIRTY NINE LYMPH NODES POSITIVE FOR METASTATIC ADENOCARCINOMA (15/39). EXTRACAPSULAR EXTENSION IS IDENTIFIED. 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  Patient had a prolonged hospitalization post surgery  due to significant NG tube output. Needed epidural and PCA for pain control. It took a while for him to be able to tolerate progression of 2 feeding. He had an abdominal drain due to a small collection near his pancreatic head. Was treated with Augmentin for this. Patient was in the hospital from 01/10/2017  and was eventually discharged on 01/27/2017.  Patient postponed several attempts at posthospitalization follow-up. He has been recovering at home and has been optimizing his tube feeding. Patient was recently followed up by Dr. Barry Dienes and has had a port placement on 02/23/2017 and preparation for possible chemotherapy.  He notes that his tube feeding is up to 40 mL per hour for 12 hours and has targeted is 16 ml per hour. He was recently admitted to [facility] in [place] for a C. difficile colitis that caused a severe diarrhea. he is currently on oral vancomycin has completed about 5 days of treatment and is also on probiotics.  INTERVAL HISTORY  Patient is here for f/u prior to his 2nd cycle of FOLFOX. He notes his diarrhea has improved. Minimal mouth soreness. Requesting something to help him sleep -- given prescription for trazodone. He continues tube feeding which is being decreased as his po intake improves. No fevers/chills night sweat. No significant diarrhea. Still on maintenance po vancomycin to suppress c diff recurrence.  MEDICAL HISTORY:  Past Medical History:  Diagnosis Date  . Anemia   . Cancer [facility])    Gastric Adenocarcinoma  . Gastric outlet obstruction 01/2017  . GERD (gastroesophageal reflux disease)   . Pre-diabetes    denies  . SVT (supraventricular tachycardia) ([place])    during Admission and Surgery- 01/2017    SURGICAL HISTORY: Past Surgical History:  Procedure Laterality Date  . CHOLECYSTECTOMY N/A 01/10/2017   Procedure: CHOLECYSTECTOMY;  Surgeon: Stark Klein, MD;  Location: [place];  Service: General;  Laterality: N/A;  . COLONOSCOPY    . COLONOSCOPY  10/11/2011   Procedure: COLONOSCOPY;  Surgeon: Daneil Dolin, MD;  [facility];  Service: Endoscopy;  Laterality: N/A;  10:30 AM  .  ESOPHAGOGASTRODUODENOSCOPY N/A 12/30/2016   Procedure: ESOPHAGOGASTRODUODENOSCOPY (EGD);  Surgeon: Carol Ada, MD;  Location[facility];  Service: Endoscopy;  Laterality:  N/A;  . GASTRECTOMY N/A 01/10/2017   Procedure: DISTAL GASTRECTOMY, JEJUNUM  FEEDING TUBE PLACEMENT;  Surgeon: Stark Klein, MD;  Location: [place];  Service: General;  Laterality: N/A;  . IR GENERIC HISTORICAL  01/24/2017   IR [place] DUODEN/JEJUNO TUBE PERCUT W/FLUORO 01/24/2017 Aletta Edouard, MD [facility]-INTERV RAD  . IR REPLC DUODEN/JEJUNO TUBE PERCUT W/FLUORO  03/26/2017  . PORTACATH PLACEMENT N/A 02/23/2017   Procedure: INSERTION PORT-A-CATH;  Surgeon: Stark Klein, MD;  Location: [place];  Service: General;  Laterality: N/A;  . testicle removed  1982    SOCIAL HISTORY: Social History   Social History  . Marital status: Married    Spouse name: N/A  . Number of children: N/A  . Years of education: N/A   Occupational History  . Not on file.   Social History Main Topics  . Smoking status: Never Smoker  . Smokeless tobacco: Never Used  . Alcohol use No     Comment: occ beer or wine but none in last 6 months   . Drug use: No  . Sexual activity: Not on file   Other Topics Concern  . Not on file   Social History Narrative  . No narrative on file    FAMILY HISTORY: Family History  Problem Relation Age of Onset  . Prostate cancer Father   . Colon cancer Neg Hx     ALLERGIES:  is allergic to no known allergies.  MEDICATIONS:  Current Outpatient Prescriptions  Medication Sig Dispense Refill  . Banana Flakes (BANATROL PO) Place 1 packet into feeding tube daily as needed (diarrhea).    . chlorproMAZINE (THORAZINE) 100 MG tablet Take 1 tablet (100 mg total) by mouth 4 (four) times daily as needed for hiccoughs. (Patient not taking: Reported on 03/25/2017) 20 tablet 0  . dexamethasone (DECADRON) 4 MG tablet Take 2 tablets (8 mg total) by mouth daily. Start the day after chemotherapy for 2 days. Take with food. 30 tablet 1  . famotidine (PEPCID) 20 MG tablet Take 20 mg by mouth 2 (two) times daily.    . ferrous sulfate 325 (65 FE) MG tablet Take 325 mg by mouth daily with breakfast.    .  HYDROcodone-acetaminophen (NORCO/VICODIN) 5-325 MG tablet Take 1-2 tablets by mouth every 6 (six) hours as needed for moderate pain. 30 tablet 0  . lidocaine-prilocaine (EMLA) cream Apply to affected area once (Patient taking differently: Apply 1 application topically See admin instructions. Apply to port access one hour prior to chemo) 30 g 3  . Nutritional Supplements (NUTREN 1.5) LIQD Place 60 mLs into feeding tube every 12 (twelve) hours.     . ondansetron (ZOFRAN) 8 MG tablet Take 1 tablet (8 mg total) by mouth 2 (two) times daily as needed for refractory nausea / vomiting. Start on day 3 after chemotherapy. 30 tablet 1  . prochlorperazine (COMPAZINE) 10 MG tablet Take 1 tablet (10 mg total) by mouth every 6 (six) hours as needed (Nausea or vomiting). 30 tablet 1  . traZODone (DESYREL) 50 MG tablet Take 1 tablet (50 mg total) by mouth at bedtime as needed for sleep. 30 tablet 0  . vancomycin (VANCOCIN) 125 MG capsule Take 1 capsule (125 mg total) by mouth every 12 (twelve) hours. 60 capsule 1  . zolpidem (AMBIEN) 5 MG tablet Take 5 mg by mouth at bedtime as needed for sleep.  No current facility-administered medications for this visit.     REVIEW OF SYSTEMS:    10 Point review of Systems was done is negative except as noted above.  PHYSICAL EXAMINATION: ECOG PERFORMANCE STATUS: 2 - Symptomatic, <50% confined to bed  . Vitals:   04/12/17 1133  BP: 120/66  Pulse: 82  Resp: 18  Temp: 98.3 F (36.8 C)   Filed Weights   04/12/17 1133  Weight: 244 lb 5 oz (110.8 kg)   .Body mass index is 34.07 kg/m.  GENERAL:alert, in no acute distress and comfortable SKIN: no acute rashes, no significant lesions EYES: conjunctiva are pink and non-injected, sclera anicteric OROPHARYNX: MMM, no exudates, no oropharyngeal erythema or ulceration NECK: supple, no JVD LYMPH:  no palpable lymphadenopathy in the cervical, axillary or inguinal regions LUNGS: clear to auscultation b/l with normal  respiratory effort HEART: regular rate & rhythm ABDOMEN:  normoactive bowel sounds , non tender, not distended. Extremity: no pedal edema PSYCH: alert & oriented x 3 with fluent speech NEURO: no focal motor/sensory deficits  LABORATORY DATA:  I have reviewed the data as listed  . CBC Latest Ref Rng & Units 04/12/2017 03/29/2017 03/25/2017  WBC 4.0 - 10.3 10e3/uL 4.2 8.2 7.5  Hemoglobin 13.0 - 17.1 g/dL 10.3(L) 11.0(L) 10.5(L)  Hematocrit 38.4 - 49.9 % 33.4(L) 34.5(L) 34.0(L)  Platelets 140 - 400 10e3/uL 232 401(H) 348    . CMP Latest Ref Rng & Units 04/12/2017 03/29/2017 03/26/2017  Glucose 70 - 140 mg/dl 128 123 104(H)  BUN 7.0 - 26.0 mg/dL 10.5 11.8 8  Creatinine 0.7 - 1.3 mg/dL 0.8 0.8 0.78  Sodium 136 - 145 mEq/L 142 140 137  Potassium 3.5 - 5.1 mEq/L 4.2 4.2 4.0  Chloride 101 - 111 mmol/L - - 101  CO2 22 - 29 mEq/L 28 27 27   Calcium 8.4 - 10.4 mg/dL 9.7 9.8 9.0  Total Protein 6.4 - 8.3 g/dL 7.5 8.1 -  Total Bilirubin 0.20 - 1.20 mg/dL 0.82 0.49 -  Alkaline Phos 40 - 150 U/L 94 94 -  AST 5 - 34 U/L 16 16 -  ALT 0 - 55 U/L 19 19 -      RADIOGRAPHIC STUDIES: I have personally reviewed the radiological images as listed and agreed with the findings in the report. Ct Chest W Contrast  Result Date: 03/22/2017 CLINICAL DATA:  Gastric adenocarcinoma 1/18. Distal gastrectomy. Jejunal tube placement and cholecystectomy on 2/18. Port-A-Cath insertion 02/23/2017. EXAM: CT CHEST, ABDOMEN, AND PELVIS WITH CONTRAST TECHNIQUE: Multidetector CT imaging of the chest, abdomen and pelvis was performed following the standard protocol during bolus administration of intravenous contrast. CONTRAST:  <See Chart> ISOVUE-300 IOPAMIDOL (ISOVUE-300) INJECTION 61% COMPARISON:  01/26/2017 abdominopelvic CT. Chest radiograph 03/01/2016. FINDINGS: CT CHEST FINDINGS Cardiovascular: left-sided Port-A-Cath which terminates at the mid SVC. Tortuous thoracic aorta. Normal heart size, without pericardial effusion. No  central pulmonary embolism, on this non-dedicated study. Mediastinum/Nodes: No supraclavicular adenopathy. No mediastinal or hilar adenopathy. Tiny hiatal hernia. Lungs/Pleura: No pleural fluid. Posterior right upper lobe subsegmental atelectasis. Musculoskeletal: No acute osseous abnormality. CT ABDOMEN PELVIS FINDINGS Hepatobiliary: Focal steatosis adjacent the falciform ligament. Hepatomegaly at 19.7 cm. Cholecystectomy, without biliary ductal dilatation. Pancreas: Normal, without mass or ductal dilatation. Spleen: Normal in size, without focal abnormality. Adrenals/Urinary Tract: Normal adrenal glands. 10 mm anterior interpolar right renal lesion measures greater than fluid density at 27 HU. Seventeen HU on the prior precontrast exam, favoring a minimally complex cyst. Normal left kidney, without hydronephrosis. Normal urinary bladder.  Stomach/Bowel: Surgical changes of gastrojejunostomy and distal gastrectomy. Apparent wall thickening just proximal to the anastomosis on image 60/series 2. The ascending colon appears thick walled, but is underdistended. Example image 71/series 2. Normal terminal ileum and appendix. Jejunostomy again identified. Normal small bowel caliber. Vascular/Lymphatic: Aortic and branch vessel atherosclerosis. Patent portal and splenic veins. No abdominopelvic adenopathy. Reproductive: Mild prostatomegaly. Other: No free pelvic fluid. No free intraperitoneal air. Fluid positioned just inferior and lateral to the pancreatic head is decreased in size. Example at 2.4 x 2.9 cm on image 69/ series 2. Compare 3.6 x 3.9 cm on the prior. Surrounding similar edema. Fat containing tiny left inguinal hernia. Musculoskeletal: Bilateral degenerative partial fusion of the sacroiliac joints. IMPRESSION: 1. Status post gastrojejunostomy. Decreased size of a peripancreatic complex fluid collection. 2. Apparent wall thickening of the ascending colon, adjacent to the fluid collection. Likely partially due to  underdistention. Colitis cannot be excluded. 3. Similarly, gastric wall thickening, just proximal to the anastomosis, for which gastritis cannot be excluded. 4.  No acute process or evidence of metastatic disease in the chest. 5.  Aortic atherosclerosis. 6. Probable complex right renal cyst, borderline too small to characterize. Recommend attention on follow-up. Electronically Signed   By: Abigail Miyamoto M.D.   On: 03/22/2017 09:57   Ct Abdomen Pelvis W Contrast  Result Date: 03/22/2017 CLINICAL DATA:  Gastric adenocarcinoma 1/18. Distal gastrectomy. Jejunal tube placement and cholecystectomy on 2/18. Port-A-Cath insertion 02/23/2017. EXAM: CT CHEST, ABDOMEN, AND PELVIS WITH CONTRAST TECHNIQUE: Multidetector CT imaging of the chest, abdomen and pelvis was performed following the standard protocol during bolus administration of intravenous contrast. CONTRAST:  <See Chart> ISOVUE-300 IOPAMIDOL (ISOVUE-300) INJECTION 61% COMPARISON:  01/26/2017 abdominopelvic CT. Chest radiograph 03/01/2016. FINDINGS: CT CHEST FINDINGS Cardiovascular: left-sided Port-A-Cath which terminates at the mid SVC. Tortuous thoracic aorta. Normal heart size, without pericardial effusion. No central pulmonary embolism, on this non-dedicated study. Mediastinum/Nodes: No supraclavicular adenopathy. No mediastinal or hilar adenopathy. Tiny hiatal hernia. Lungs/Pleura: No pleural fluid. Posterior right upper lobe subsegmental atelectasis. Musculoskeletal: No acute osseous abnormality. CT ABDOMEN PELVIS FINDINGS Hepatobiliary: Focal steatosis adjacent the falciform ligament. Hepatomegaly at 19.7 cm. Cholecystectomy, without biliary ductal dilatation. Pancreas: Normal, without mass or ductal dilatation. Spleen: Normal in size, without focal abnormality. Adrenals/Urinary Tract: Normal adrenal glands. 10 mm anterior interpolar right renal lesion measures greater than fluid density at 27 HU. Seventeen HU on the prior precontrast exam, favoring a  minimally complex cyst. Normal left kidney, without hydronephrosis. Normal urinary bladder. Stomach/Bowel: Surgical changes of gastrojejunostomy and distal gastrectomy. Apparent wall thickening just proximal to the anastomosis on image 60/series 2. The ascending colon appears thick walled, but is underdistended. Example image 71/series 2. Normal terminal ileum and appendix. Jejunostomy again identified. Normal small bowel caliber. Vascular/Lymphatic: Aortic and branch vessel atherosclerosis. Patent portal and splenic veins. No abdominopelvic adenopathy. Reproductive: Mild prostatomegaly. Other: No free pelvic fluid. No free intraperitoneal air. Fluid positioned just inferior and lateral to the pancreatic head is decreased in size. Example at 2.4 x 2.9 cm on image 69/ series 2. Compare 3.6 x 3.9 cm on the prior. Surrounding similar edema. Fat containing tiny left inguinal hernia. Musculoskeletal: Bilateral degenerative partial fusion of the sacroiliac joints. IMPRESSION: 1. Status post gastrojejunostomy. Decreased size of a peripancreatic complex fluid collection. 2. Apparent wall thickening of the ascending colon, adjacent to the fluid collection. Likely partially due to underdistention. Colitis cannot be excluded. 3. Similarly, gastric wall thickening, just proximal to the anastomosis, for which gastritis cannot be  excluded. 4.  No acute process or evidence of metastatic disease in the chest. 5.  Aortic atherosclerosis. 6. Probable complex right renal cyst, borderline too small to characterize. Recommend attention on follow-up. Electronically Signed   By: Abigail Miyamoto M.D.   On: 03/22/2017 09:57   Ir Replc Duoden/jejuno Tube Percut W/fluoro  Result Date: 03/26/2017 INDICATION: 66 year old male with a history of prior distal gastrectomy for adenocarcinoma and surgically placed left lower quadrant jejunostomy tube. The patient has experienced multiple episodes of inadvertent tube displacement. The prior tube was  a 20 [place] red Robin catheter which was sutured to the skin. When the sutures fail, the tube falls out in short order. It is now been replaced 3 times at various institutions. He presents today through the emergency department with a displaced tube. EXAM: IR REPLACE DUODEN/JEJUNO TUBE PERCUT WITH FLOURO MEDICATIONS: None ANESTHESIA/SEDATION: None CONTRAST:  15 mL Isovue 370 - administered into the gastric lumen. FLUOROSCOPY TIME:  Fluoroscopy Time: 1 minutes 0 seconds (5 mGy). COMPLICATIONS: None immediate. PROCEDURE: Informed written consent was obtained from the patient after a thorough discussion of the procedural risks, benefits and alternatives. All questions were addressed. Maximal Sterile Barrier Technique was utilized including caps, mask, sterile gowns, sterile gloves, sterile drape, hand hygiene and skin antiseptic. A timeout was performed prior to the initiation of the procedure. A Kumpe the catheter was advanced to the soft tissue tract in into the jejunum. A gentle hand injection of contrast material confirms the location of the 5 French catheter within the jejunum. An Amplatz wire was then advanced through the Kumpe the catheter in the Kumpe the catheter was removed. A 20 French balloon retention percutaneous gastrostomy tube was then advanced over the wire. A 9 French dilator was also advanced over the wire in used to reinforce the gastrostomy tube. The tube was then successfully advanced over the wire and into the jejunum. The wire was removed. The retention balloon was inflated with 8 mL dilute contrast. The tube was pulled against the anterior abdominal wall in the external bumper fixed in place. Gentle hand injections of contrast material under fluoroscopy demonstrates that the tip of the tube is within the jejunum in that there is no limitation of flow through the jejunum secondary to the presence of the retention balloon. Images were obtained and stored for the medical record. The patient  tolerated the procedure well. IMPRESSION: Successful replacement of displaced jejunostomy tube using a 69 French balloon retention percutaneous gastrostomy tube advanced through the jejunostomy site. Hopefully this balloon retention device well help limit further episodes of inadvertent tube displacement. Tube feeds may resume. No barriers to discharge. Signed, Criselda Peaches, MD [facility] Electronically Signed   By: Jacqulynn Cadet M.D.   On: 03/26/2017 13:51    ASSESSMENT & PLAN:   66 year old African-American male with  1) Newly diagnosed pT4a, pN3b, pM1 invasive moderately-poorly Gastric Adenocarcinoma . Her 2 neg by FISH LVI + margins neg 15/39 LN +ve, Extracapsular invasion +, some involvement of pancreatic capsule there pM1 Presented with severe pyloric stenosis with ulceration. 60 pound weight loss. S/p distal gastrectomy, cholecystectomy and en bloc dissection off the small part of the pancreatic head  with jejunal feeding tube placement. Initial CT abdomen and pelvis do not show any local regional lymphadenopathy. Rpt CT chest/abd/pelvis from 03/22/2017 - no overt evidence of residual disease or metastatic disease. Post-surgical peripancreatic fluid collection resolving.  #2 moderate protein calorie malnutrition patient has lost about  60 pounds in the last 2-3 months. . Wt Readings from Last 3 Encounters:  04/26/17 247 lb 3.2 oz (112.1 kg)  04/12/17 244 lb 5 oz (110.8 kg)  03/25/17 235 lb 4 oz (106.7 kg)   -his po intake has improved and he is gaining back his lost weight. -continue jejunal tube feeding which is is being decreased as his po intake improves.  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 No oovert issues with diarrhea currently. Plan  -CT  chest/abd/pelvis from 03/22/2017 show no overt metastatic or radiographically measureable residual disease -no prohibitive toxicities from C1 of FOLFOX. Chemotherapy -labs stable and we shall proceed with C2 of FOLFOX chemotherapy. -we shall plan to complete his c diff rx as prescribed -plan to complete 3 months /6 cycles of FOLFOX and then evaluation fo possible chemo-RT -continue f/u with Dory Peru for continue mx and optimization of tube feeding.  #4 suspected sleep apnea.Has not had a sleep study.Wife notes significant snoring which is important with weight loss. Will have to be careful with excessive sedation. -given low dose trazodone prn for insomnia  #5 obesity - has lost in excess of 60 pounds since his symptoms started in November 2017.   -continue FOLFOX as per orders q2weeks with labs RTC with Dr Irene Limbo in 4 weeks with C3D1  All of the patients questions were answered with apparent satisfaction. The patient knows to call the clinic with any problems, questions or concerns.  I spent 25 minutes counseling the patient face to face. The total time spent in the appointment was 40 minutes and more than 50% was on counseling and direct patient cares.   All of the patients questions were answered with apparent satisfaction. The patient knows to call the clinic with any problems, questions or concerns.   Sullivan Lone MD [place] AAHIVMS [facility] Hematology/Oncology Physician [facility]  (Office):       [phone] (Work cell):  [phone] (Fax):           [phone]

## 2017-04-26 ENCOUNTER — Encounter: Payer: Self-pay | Admitting: Hematology

## 2017-04-26 ENCOUNTER — Ambulatory Visit (HOSPITAL_BASED_OUTPATIENT_CLINIC_OR_DEPARTMENT_OTHER): Payer: BLUE CROSS/BLUE SHIELD

## 2017-04-26 ENCOUNTER — Ambulatory Visit (HOSPITAL_BASED_OUTPATIENT_CLINIC_OR_DEPARTMENT_OTHER): Payer: BLUE CROSS/BLUE SHIELD | Admitting: Hematology

## 2017-04-26 ENCOUNTER — Other Ambulatory Visit (HOSPITAL_BASED_OUTPATIENT_CLINIC_OR_DEPARTMENT_OTHER): Payer: BLUE CROSS/BLUE SHIELD

## 2017-04-26 VITALS — BP 128/58 | HR 83 | Temp 98.5°F | Resp 20 | Ht 71.0 in | Wt 247.2 lb

## 2017-04-26 DIAGNOSIS — C169 Malignant neoplasm of stomach, unspecified: Secondary | ICD-10-CM | POA: Diagnosis not present

## 2017-04-26 DIAGNOSIS — Z5111 Encounter for antineoplastic chemotherapy: Secondary | ICD-10-CM | POA: Diagnosis not present

## 2017-04-26 DIAGNOSIS — D630 Anemia in neoplastic disease: Secondary | ICD-10-CM

## 2017-04-26 DIAGNOSIS — C163 Malignant neoplasm of pyloric antrum: Secondary | ICD-10-CM

## 2017-04-26 DIAGNOSIS — E440 Moderate protein-calorie malnutrition: Secondary | ICD-10-CM

## 2017-04-26 DIAGNOSIS — C164 Malignant neoplasm of pylorus: Secondary | ICD-10-CM | POA: Diagnosis not present

## 2017-04-26 DIAGNOSIS — Z7189 Other specified counseling: Secondary | ICD-10-CM

## 2017-04-26 DIAGNOSIS — A0472 Enterocolitis due to Clostridium difficile, not specified as recurrent: Secondary | ICD-10-CM

## 2017-04-26 LAB — COMPREHENSIVE METABOLIC PANEL
ALT: 16 U/L (ref 0–55)
AST: 18 U/L (ref 5–34)
Albumin: 3.3 g/dL — ABNORMAL LOW (ref 3.5–5.0)
Alkaline Phosphatase: 89 U/L (ref 40–150)
Anion Gap: 10 mEq/L (ref 3–11)
BUN: 9.3 mg/dL (ref 7.0–26.0)
CO2: 26 mEq/L (ref 22–29)
Calcium: 9.7 mg/dL (ref 8.4–10.4)
Chloride: 105 mEq/L (ref 98–109)
Creatinine: 0.9 mg/dL (ref 0.7–1.3)
EGFR: >90 mL/min/{1.73_m2} (ref >90)
Glucose: 152 mg/dl — ABNORMAL HIGH (ref 70–140)
Potassium: 4 mEq/L (ref 3.5–5.1)
Sodium: 140 mEq/L (ref 136–145)
Total Bilirubin: 0.61 mg/dL (ref 0.20–1.20)
Total Protein: 7.3 g/dL (ref 6.4–8.3)

## 2017-04-26 LAB — CBC WITH DIFFERENTIAL/PLATELET
BASO%: 0.8 % (ref 0.0–2.0)
Basophils Absolute: 0 10*3/uL (ref 0.0–0.1)
EOS%: 1.4 % (ref 0.0–7.0)
Eosinophils Absolute: 0.1 10*3/uL (ref 0.0–0.5)
HCT: 33.6 % — ABNORMAL LOW (ref 38.4–49.9)
HGB: 10.3 g/dL — ABNORMAL LOW (ref 13.0–17.1)
LYMPH%: 24.7 % (ref 14.0–49.0)
MCH: 23.8 pg — ABNORMAL LOW (ref 27.2–33.4)
MCHC: 30.7 g/dL — ABNORMAL LOW (ref 32.0–36.0)
MCV: 77.8 fL — ABNORMAL LOW (ref 79.3–98.0)
MONO#: 0.5 10*3/uL (ref 0.1–0.9)
MONO%: 10.1 % (ref 0.0–14.0)
NEUT#: 3.3 10*3/uL (ref 1.5–6.5)
NEUT%: 63 % (ref 39.0–75.0)
Platelets: 187 10*3/uL (ref 140–400)
RBC: 4.32 10*6/uL (ref 4.20–5.82)
RDW: 21 % — ABNORMAL HIGH (ref 11.0–14.6)
WBC: 5.2 10*3/uL (ref 4.0–10.3)
lymph#: 1.3 10*3/uL (ref 0.9–3.3)
nRBC: 0 % (ref 0–0)

## 2017-04-26 MED ORDER — FLUOROURACIL CHEMO INJECTION 2.5 GM/50ML
400.0000 mg/m2 | Freq: Once | INTRAVENOUS | Status: AC
  Administered 2017-04-26: 950 mg via INTRAVENOUS
  Filled 2017-04-26: qty 19

## 2017-04-26 MED ORDER — SODIUM CHLORIDE 0.9 % IV SOLN
2400.0000 mg/m2 | INTRAVENOUS | Status: DC
  Administered 2017-04-26: 5600 mg via INTRAVENOUS
  Filled 2017-04-26: qty 112

## 2017-04-26 MED ORDER — DEXTROSE 5 % IV SOLN
Freq: Once | INTRAVENOUS | Status: AC
  Administered 2017-04-26: 13:00:00 via INTRAVENOUS

## 2017-04-26 MED ORDER — DEXAMETHASONE SODIUM PHOSPHATE 10 MG/ML IJ SOLN
INTRAMUSCULAR | Status: AC
  Filled 2017-04-26: qty 1

## 2017-04-26 MED ORDER — PALONOSETRON HCL INJECTION 0.25 MG/5ML
INTRAVENOUS | Status: AC
  Filled 2017-04-26: qty 5

## 2017-04-26 MED ORDER — DEXAMETHASONE SODIUM PHOSPHATE 10 MG/ML IJ SOLN
10.0000 mg | Freq: Once | INTRAMUSCULAR | Status: AC
  Administered 2017-04-26: 10 mg via INTRAVENOUS

## 2017-04-26 MED ORDER — CLINDAMYCIN PHOSPHATE 1 % EX GEL: Freq: Two times a day (BID) | CUTANEOUS | 0 refills | Status: AC

## 2017-04-26 MED ORDER — OXALIPLATIN CHEMO INJECTION 100 MG/20ML
85.0000 mg/m2 | Freq: Once | INTRAVENOUS | Status: AC
  Administered 2017-04-26: 200 mg via INTRAVENOUS
  Filled 2017-04-26: qty 40

## 2017-04-26 MED ORDER — LEUCOVORIN CALCIUM INJECTION 350 MG
400.0000 mg/m2 | Freq: Once | INTRAVENOUS | Status: AC
  Administered 2017-04-26: 936 mg via INTRAVENOUS
  Filled 2017-04-26: qty 46.8

## 2017-04-26 MED ORDER — TRAZODONE HCL 50 MG PO TABS: 50.0000 mg | ORAL_TABLET | Freq: Every evening | ORAL | 1 refills | Status: DC | PRN

## 2017-04-26 MED ORDER — PALONOSETRON HCL INJECTION 0.25 MG/5ML
0.2500 mg | Freq: Once | INTRAVENOUS | Status: AC
  Administered 2017-04-26: 0.25 mg via INTRAVENOUS

## 2017-04-26 NOTE — Patient Instructions (Signed)
Thank you for choosing [facility] to provide your oncology and hematology care.  To afford each patient quality time with our providers, please arrive 30 minutes before your scheduled appointment time.  If you arrive late for your appointment, you may be asked to reschedule.  We strive to give you quality time with our providers, and arriving late affects you and other patients whose appointments are after yours.  If you are a no show for multiple scheduled visits, you may be dismissed from the clinic at the providers discretion.   Again, thank you for choosing [facility], our hope is that these requests will decrease the amount of time that you wait before being seen by our physicians.  ______________________________________________________________________ Should you have questions after your visit to the [facility], please contact our office at [phone] between the hours of 8:30 and 4:30 p.m.    Voicemails left after 4:30p.m will not be returned until the following business day.   For prescription refill requests, please have your pharmacy contact us directly.  Please also try to allow 48 hours for prescription requests.   Please contact the scheduling department for questions regarding scheduling.  For scheduling of procedures such as PET scans, CT scans, MRI, Ultrasound, etc please contact central scheduling at [phone].   Resources For Cancer Patients and Caregivers:  American Cancer Society:  800-227-2345  Can help patients locate various types of support and financial assistance Cancer Care: 1-800-813-HOPE (4673) Provides financial assistance, online support groups, medication/co-pay assistance.   [place] DSS:  336-641-3447 Where to apply for food stamps, Medicaid, and utility assistance Medicare Rights Center: 800-333-4114 Helps people with Medicare understand their rights and benefits, navigate the Medicare system, and secure the  quality healthcare they deserve SCAT: 336-333-6589 [place] [employer]'s shared-ride transportation service for eligible riders who have a disability that prevents them from riding the fixed route bus.   For additional information on assistance programs please contact our social worker:   Grier Hock/Abigail Elmore:  [phone] 

## 2017-04-28 ENCOUNTER — Ambulatory Visit (HOSPITAL_BASED_OUTPATIENT_CLINIC_OR_DEPARTMENT_OTHER): Payer: BLUE CROSS/BLUE SHIELD

## 2017-04-28 VITALS — BP 115/57 | HR 53 | Temp 97.8°F | Resp 18

## 2017-04-28 DIAGNOSIS — Z452 Encounter for adjustment and management of vascular access device: Secondary | ICD-10-CM

## 2017-04-28 DIAGNOSIS — C163 Malignant neoplasm of pyloric antrum: Secondary | ICD-10-CM | POA: Diagnosis not present

## 2017-04-28 DIAGNOSIS — Z7189 Other specified counseling: Secondary | ICD-10-CM

## 2017-04-28 MED ORDER — SODIUM CHLORIDE 0.9% FLUSH
10.0000 mL | INTRAVENOUS | Status: DC | PRN
  Administered 2017-04-28: 10 mL
  Filled 2017-04-28: qty 10

## 2017-04-28 MED ORDER — HEPARIN SOD (PORK) LOCK FLUSH 100 UNIT/ML IV SOLN
500.0000 [IU] | Freq: Once | INTRAVENOUS | Status: AC | PRN
  Administered 2017-04-28: 500 [IU]
  Filled 2017-04-28: qty 5

## 2017-05-02 NOTE — Progress Notes (Signed)
HEMATOLOGY/ONCOLOGY CLINIC NOTE  Date of Service: .04/26/2017  Patient Care Team: [name], MD as PCP - General (Internal Medicine) [name] (Surgery)   CHIEF COMPLAINTS/PURPOSE OF CONSULTATION:   f/u for gastric Adenocarcinoma.  HISTORY OF PRESENTING ILLNESS:   [name] is a wonderful 66 y.o. male is here for continued evaluation and management of recently diagnosed pT4a, pN3b, pM1 Gastric Adenocarcinoma.  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 Patient had presented to primary care physician with dyspeptic symptoms, partial gastric outlet obstruction and weight loss of 60 pounds. Patient has had a distal gastrectomy, cholecystectomy with jejunal feeding tube placement by Dr. Barry Dienes on 01/10/2017 for gastric obstruction secondary to adenocarcinoma of the gastric pylorus. He was noted to have invaded into the pancreas and a bit of the anterior pancreatic head was resected en bloc with the tumor.  Pathology showed  " INVASIVE MODERATELY TO POORLY DIFFERENTIATED ADENOCARCINOMA, SPANNING 4 CM IN GREATEST DIMENSION. TUMOR INVADES THROUGH STOMACH WALL TO INVOLVE SEROSAL SURFACE.  LYMPH/VASCULAR INVASION IS IDENTIFIED.  MARGINS ARE NEGATIVE.- FIFTEEN OF THIRTY NINE LYMPH NODES POSITIVE FOR METASTATIC ADENOCARCINOMA (15/39). EXTRACAPSULAR EXTENSION IS IDENTIFIED. 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  Patient had a prolonged hospitalization post surgery  due to significant NG tube output. Needed epidural and PCA for pain control. It took a while for him to be able to tolerate progression of 2 feeding. He had an abdominal drain due to a small collection near his pancreatic head. Was treated with Augmentin for this. Patient was in the hospital from 01/10/2017 and  was eventually discharged on 01/27/2017.  Patient postponed several attempts at posthospitalization follow-up. He has been recovering at home and has been optimizing his tube feeding. Patient was recently followed up by Dr. Barry Dienes and has had a port placement on 02/23/2017 and preparation for possible chemotherapy.  He notes that his tube feeding is up to 40 mL per hour for 12 hours and has targeted is 16 ml per hour. He was recently admitted to [facility] in [place] for a C. difficile colitis that caused a severe diarrhea. he is currently on oral vancomycin has completed about 5 days of treatment and is also on probiotics.  INTERVAL HISTORY  Patient is here for f/u prior to his 3rd cycle of FOLFOX. Improving po intake and appetite. Notes that his tube feeding has been reduced based on po intake. No significant diarrhea. Sleeping a little better. Minimal irritation around feeding tube - using Bactracin-Zn ointment. No other acute new concerns.  MEDICAL HISTORY:  Past Medical History:  Diagnosis Date  . Anemia   . Cancer [facility])    Gastric Adenocarcinoma  . Gastric outlet obstruction 01/2017  . GERD (gastroesophageal reflux disease)   . Pre-diabetes    denies  . SVT (supraventricular tachycardia) ([place])    during Admission and Surgery- 01/2017    SURGICAL HISTORY: Past Surgical History:  Procedure Laterality Date  . CHOLECYSTECTOMY N/A 01/10/2017   Procedure: CHOLECYSTECTOMY;  Surgeon: Stark Klein, MD;  Location: [place];  Service: General;  Laterality: N/A;  . COLONOSCOPY    . COLONOSCOPY  10/11/2011   Procedure: COLONOSCOPY;  Surgeon: Daneil Dolin, MD;  [facility];  Service: Endoscopy;  Laterality: N/A;  10:30 AM  . ESOPHAGOGASTRODUODENOSCOPY N/A 12/30/2016   Procedure: ESOPHAGOGASTRODUODENOSCOPY (EGD);  Surgeon: Saralyn Pilar  Benson Norway, MD;  Location: [facility];  Service: Endoscopy;  Laterality: N/A;  . GASTRECTOMY N/A 01/10/2017   Procedure: DISTAL GASTRECTOMY, JEJUNUM  FEEDING TUBE  PLACEMENT;  Surgeon: Stark Klein, MD;  Location: [place];  Service: General;  Laterality: N/A;  . IR GENERIC HISTORICAL  01/24/2017   IR [place] DUODEN/JEJUNO TUBE PERCUT W/FLUORO 01/24/2017 Aletta Edouard, MD [facility]-INTERV RAD  . IR REPLC DUODEN/JEJUNO TUBE PERCUT W/FLUORO  03/26/2017  . PORTACATH PLACEMENT N/A 02/23/2017   Procedure: INSERTION PORT-A-CATH;  Surgeon: Stark Klein, MD;  Location: [place];  Service: General;  Laterality: N/A;  . testicle removed  1982    SOCIAL HISTORY: Social History   Social History  . Marital status: Married    Spouse name: N/A  . Number of children: N/A  . Years of education: N/A   Occupational History  . Not on file.   Social History Main Topics  . Smoking status: Never Smoker  . Smokeless tobacco: Never Used  . Alcohol use No     Comment: occ beer or wine but none in last 6 months   . Drug use: No  . Sexual activity: Not on file   Other Topics Concern  . Not on file   Social History Narrative  . No narrative on file    FAMILY HISTORY: Family History  Problem Relation Age of Onset  . Prostate cancer Father   . Colon cancer Neg Hx     ALLERGIES:  is allergic to no known allergies.  MEDICATIONS:  Current Outpatient Prescriptions  Medication Sig Dispense Refill  . Banana Flakes (BANATROL PO) Place 1 packet into feeding tube daily as needed (diarrhea).    . chlorproMAZINE (THORAZINE) 100 MG tablet Take 1 tablet (100 mg total) by mouth 4 (four) times daily as needed for hiccoughs. (Patient not taking: Reported on 03/25/2017) 20 tablet 0  . clindamycin (CLINDAGEL) 1 % gel Apply topically 2 (two) times daily. 30 g 0  . dexamethasone (DECADRON) 4 MG tablet Take 2 tablets (8 mg total) by mouth daily. Start the day after chemotherapy for 2 days. Take with food. 30 tablet 1  . famotidine (PEPCID) 20 MG tablet Take 20 mg by mouth 2 (two) times daily.    . ferrous sulfate 325 (65 FE) MG tablet Take 325 mg by mouth daily with breakfast.    .  HYDROcodone-acetaminophen (NORCO/VICODIN) 5-325 MG tablet Take 1-2 tablets by mouth every 6 (six) hours as needed for moderate pain. 30 tablet 0  . lidocaine-prilocaine (EMLA) cream Apply to affected area once (Patient taking differently: Apply 1 application topically See admin instructions. Apply to port access one hour prior to chemo) 30 g 3  . Nutritional Supplements (NUTREN 1.5) LIQD Place 60 mLs into feeding tube every 12 (twelve) hours.     . ondansetron (ZOFRAN) 8 MG tablet Take 1 tablet (8 mg total) by mouth 2 (two) times daily as needed for refractory nausea / vomiting. Start on day 3 after chemotherapy. 30 tablet 1  . prochlorperazine (COMPAZINE) 10 MG tablet Take 1 tablet (10 mg total) by mouth every 6 (six) hours as needed (Nausea or vomiting). 30 tablet 1  . traZODone (DESYREL) 50 MG tablet Take 1 tablet (50 mg total) by mouth at bedtime as needed for sleep. 30 tablet 1   No current facility-administered medications for this visit.     REVIEW OF SYSTEMS:    10 Point review of Systems was done is negative except as noted above.  PHYSICAL EXAMINATION:  ECOG PERFORMANCE STATUS: 2 - Symptomatic, <50% confined to bed  . Vitals:   04/26/17 1045  BP: (!) 128/58  Pulse: 83  Resp: 20  Temp: 98.5 F (36.9 C)   Filed Weights   04/26/17 1045  Weight: 247 lb 3.2 oz (112.1 kg)   .Body mass index is 34.48 kg/m.  GENERAL:alert, in no acute distress and comfortable SKIN: no acute rashes, no significant lesions EYES: conjunctiva are pink and non-injected, sclera anicteric OROPHARYNX: MMM, no exudates, no oropharyngeal erythema or ulceration NECK: supple, no JVD LYMPH:  no palpable lymphadenopathy in the cervical, axillary or inguinal regions LUNGS: clear to auscultation b/l with normal respiratory effort HEART: regular rate & rhythm ABDOMEN:  normoactive bowel sounds , non tender, not distended. Extremity: no pedal edema PSYCH: alert & oriented x 3 with fluent speech NEURO: no  focal motor/sensory deficits  LABORATORY DATA:  I have reviewed the data as listed  . CBC Latest Ref Rng & Units 04/26/2017 04/12/2017 03/29/2017  WBC 4.0 - 10.3 10e3/uL 5.2 4.2 8.2  Hemoglobin 13.0 - 17.1 g/dL 10.3(L) 10.3(L) 11.0(L)  Hematocrit 38.4 - 49.9 % 33.6(L) 33.4(L) 34.5(L)  Platelets 140 - 400 10e3/uL 187 232 401(H)    . CMP Latest Ref Rng & Units 04/26/2017 04/12/2017 03/29/2017  Glucose 70 - 140 mg/dl 152(H) 128 123  BUN 7.0 - 26.0 mg/dL 9.3 10.5 11.8  Creatinine 0.7 - 1.3 mg/dL 0.9 0.8 0.8  Sodium 136 - 145 mEq/L 140 142 140  Potassium 3.5 - 5.1 mEq/L 4.0 4.2 4.2  Chloride 101 - 111 mmol/L - - -  CO2 22 - 29 mEq/L 26 28 27   Calcium 8.4 - 10.4 mg/dL 9.7 9.7 9.8  Total Protein 6.4 - 8.3 g/dL 7.3 7.5 8.1  Total Bilirubin 0.20 - 1.20 mg/dL 0.61 0.82 0.49  Alkaline Phos 40 - 150 U/L 89 94 94  AST 5 - 34 U/L 18 16 16   ALT 0 - 55 U/L 16 19 19       RADIOGRAPHIC STUDIES: I have personally reviewed the radiological images as listed and agreed with the findings in the report. No results found.  ASSESSMENT & PLAN:   66 year old African-American male with  1) Newly diagnosed pT4a, pN3b, pM1 invasive moderately-poorly Gastric Adenocarcinoma . Her 2 neg by FISH LVI + margins neg 15/39 LN +ve, Extracapsular invasion +, some involvement of pancreatic capsule there pM1 Presented with severe pyloric stenosis with ulceration. 60 pound weight loss. S/p distal gastrectomy, cholecystectomy and en bloc dissection off the small part of the pancreatic head  with jejunal feeding tube placement. Initial CT abdomen and pelvis do not show any local regional lymphadenopathy. Rpt CT chest/abd/pelvis from 03/22/2017 - no overt evidence of residual disease or metastatic disease. Post-surgical peripancreatic fluid collection resolving.  #2 moderate protein calorie malnutrition patient has lost about 60 pounds in the last 2-3 months. . Wt Readings from Last 3 Encounters:  04/26/17 247 lb 3.2  oz (112.1 kg)  04/12/17 244 lb 5 oz (110.8 kg)  03/25/17 235 lb 4 oz (106.7 kg)   -his po intake has improved and he is gaining back his lost weight. -continue jejunal tube feeding which is is being decreased as his po intake improves.  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 No overt issues with diarrhea currently. Shall be completing his prolonged po vancomycin  course. Plan  -no prohibitive toxicities from C2 of FOLFOX. Chemotherapy -labs stable and we shall proceed with C3 of FOLFOX chemotherapy. -plan to complete 3 months /6 cycles of FOLFOX and then evaluation fo possible chemo-RT -continue f/u with Dory Peru for continue mx and optimization of tube feeding.  #4 suspected sleep apnea.Has not had a sleep study.Wife notes significant snoring which is important with weight loss. Will have to be careful with excessive sedation. -continue prn low dose trazodone for insomnia -recommended he f/u with his PCP for consideration of sleep study.   continue FOLFOX as per schedule -CT chest abd pelvis in 4 weeks  -RTC with Dr Irene Limbo in 4 weeks with labs and CT chest/abd/pelvis  All of the patients questions were answered with apparent satisfaction. The patient knows to call the clinic with any problems, questions or concerns.  I spent 20 minutes counseling the patient face to face. The total time spent in the appointment was 30 minutes and more than 50% was on counseling and direct patient cares.   All of the patients questions were answered with apparent satisfaction. The patient knows to call the clinic with any problems, questions or concerns.   Sullivan Lone MD [place] AAHIVMS [facility] Hematology/Oncology Physician [facility]  (Office):       [phone] (Work cell):  [phone] (Fax):            [phone]

## 2017-05-03 ENCOUNTER — Encounter: Payer: BLUE CROSS/BLUE SHIELD | Admitting: Nutrition

## 2017-05-03 ENCOUNTER — Encounter: Payer: Self-pay | Admitting: *Deleted

## 2017-05-03 ENCOUNTER — Ambulatory Visit: Payer: BLUE CROSS/BLUE SHIELD

## 2017-05-10 ENCOUNTER — Encounter: Payer: BLUE CROSS/BLUE SHIELD | Admitting: Nutrition

## 2017-05-10 ENCOUNTER — Other Ambulatory Visit (HOSPITAL_BASED_OUTPATIENT_CLINIC_OR_DEPARTMENT_OTHER): Payer: BLUE CROSS/BLUE SHIELD

## 2017-05-10 ENCOUNTER — Ambulatory Visit (HOSPITAL_BASED_OUTPATIENT_CLINIC_OR_DEPARTMENT_OTHER): Payer: BLUE CROSS/BLUE SHIELD

## 2017-05-10 VITALS — BP 109/59 | HR 78 | Temp 98.2°F | Resp 18

## 2017-05-10 DIAGNOSIS — Z5111 Encounter for antineoplastic chemotherapy: Secondary | ICD-10-CM | POA: Diagnosis not present

## 2017-05-10 DIAGNOSIS — C163 Malignant neoplasm of pyloric antrum: Secondary | ICD-10-CM

## 2017-05-10 DIAGNOSIS — C164 Malignant neoplasm of pylorus: Secondary | ICD-10-CM

## 2017-05-10 DIAGNOSIS — Z7189 Other specified counseling: Secondary | ICD-10-CM

## 2017-05-10 DIAGNOSIS — C169 Malignant neoplasm of stomach, unspecified: Secondary | ICD-10-CM

## 2017-05-10 LAB — CBC WITH DIFFERENTIAL/PLATELET
BASO%: 0.6 % (ref 0.0–2.0)
Basophils Absolute: 0 10*3/uL (ref 0.0–0.1)
EOS%: 1.3 % (ref 0.0–7.0)
Eosinophils Absolute: 0.1 10*3/uL (ref 0.0–0.5)
HCT: 32.5 % — ABNORMAL LOW (ref 38.4–49.9)
HGB: 9.9 g/dL — ABNORMAL LOW (ref 13.0–17.1)
LYMPH%: 20.7 % (ref 14.0–49.0)
MCH: 24.3 pg — ABNORMAL LOW (ref 27.2–33.4)
MCHC: 30.5 g/dL — ABNORMAL LOW (ref 32.0–36.0)
MCV: 79.7 fL (ref 79.3–98.0)
MONO#: 0.5 10*3/uL (ref 0.1–0.9)
MONO%: 10.1 % (ref 0.0–14.0)
NEUT#: 3.6 10*3/uL (ref 1.5–6.5)
NEUT%: 67.3 % (ref 39.0–75.0)
Platelets: 129 10*3/uL — ABNORMAL LOW (ref 140–400)
RBC: 4.08 10*6/uL — ABNORMAL LOW (ref 4.20–5.82)
RDW: 20.4 % — ABNORMAL HIGH (ref 11.0–14.6)
WBC: 5.3 10*3/uL (ref 4.0–10.3)
lymph#: 1.1 10*3/uL (ref 0.9–3.3)
nRBC: 0 % (ref 0–0)

## 2017-05-10 LAB — COMPREHENSIVE METABOLIC PANEL
ALT: 18 U/L (ref 0–55)
AST: 18 U/L (ref 5–34)
Albumin: 3.3 g/dL — ABNORMAL LOW (ref 3.5–5.0)
Alkaline Phosphatase: 77 U/L (ref 40–150)
Anion Gap: 11 mEq/L (ref 3–11)
BUN: 12.2 mg/dL (ref 7.0–26.0)
CO2: 26 mEq/L (ref 22–29)
Calcium: 9.4 mg/dL (ref 8.4–10.4)
Chloride: 106 mEq/L (ref 98–109)
Creatinine: 0.9 mg/dL (ref 0.7–1.3)
EGFR: >90 mL/min/{1.73_m2} (ref >90)
Glucose: 152 mg/dl — ABNORMAL HIGH (ref 70–140)
Potassium: 3.9 mEq/L (ref 3.5–5.1)
Sodium: 142 mEq/L (ref 136–145)
Total Bilirubin: 0.51 mg/dL (ref 0.20–1.20)
Total Protein: 7.1 g/dL (ref 6.4–8.3)

## 2017-05-10 LAB — TECHNOLOGIST REVIEW

## 2017-05-10 MED ORDER — PALONOSETRON HCL INJECTION 0.25 MG/5ML
INTRAVENOUS | Status: AC
  Filled 2017-05-10: qty 5

## 2017-05-10 MED ORDER — SODIUM CHLORIDE 0.9% FLUSH
10.0000 mL | INTRAVENOUS | Status: DC | PRN
  Administered 2017-05-10: 10 mL via INTRAVENOUS
  Filled 2017-05-10: qty 10

## 2017-05-10 MED ORDER — SODIUM CHLORIDE 0.9 % IV SOLN
2400.0000 mg/m2 | INTRAVENOUS | Status: DC
  Administered 2017-05-10: 5600 mg via INTRAVENOUS
  Filled 2017-05-10: qty 112

## 2017-05-10 MED ORDER — DEXTROSE 5 % IV SOLN
Freq: Once | INTRAVENOUS | Status: AC
  Administered 2017-05-10: 13:00:00 via INTRAVENOUS

## 2017-05-10 MED ORDER — OXALIPLATIN CHEMO INJECTION 100 MG/20ML
85.0000 mg/m2 | Freq: Once | INTRAVENOUS | Status: AC
  Administered 2017-05-10: 200 mg via INTRAVENOUS
  Filled 2017-05-10: qty 40

## 2017-05-10 MED ORDER — DEXAMETHASONE SODIUM PHOSPHATE 10 MG/ML IJ SOLN
10.0000 mg | Freq: Once | INTRAMUSCULAR | Status: AC
  Administered 2017-05-10: 10 mg via INTRAVENOUS

## 2017-05-10 MED ORDER — FLUOROURACIL CHEMO INJECTION 2.5 GM/50ML
400.0000 mg/m2 | Freq: Once | INTRAVENOUS | Status: AC
  Administered 2017-05-10: 950 mg via INTRAVENOUS
  Filled 2017-05-10: qty 19

## 2017-05-10 MED ORDER — LEUCOVORIN CALCIUM INJECTION 350 MG
400.0000 mg/m2 | Freq: Once | INTRAVENOUS | Status: AC
  Administered 2017-05-10: 936 mg via INTRAVENOUS
  Filled 2017-05-10: qty 46.8

## 2017-05-10 MED ORDER — DEXAMETHASONE SODIUM PHOSPHATE 10 MG/ML IJ SOLN
INTRAMUSCULAR | Status: AC
  Filled 2017-05-10: qty 1

## 2017-05-10 MED ORDER — PALONOSETRON HCL INJECTION 0.25 MG/5ML
0.2500 mg | Freq: Once | INTRAVENOUS | Status: AC
  Administered 2017-05-10: 0.25 mg via INTRAVENOUS

## 2017-05-10 NOTE — Patient Instructions (Signed)
[facility] Discharge Instructions for Patients Receiving Chemotherapy  Today you received the following chemotherapy agents: Oxaliplatin, Leucovorin and Adrucil.  To help prevent nausea and vomiting after your treatment, we encourage you to take your nausea medication as directed. No Zofran for 3 days. Take Compazine instead.    If you develop nausea and vomiting that is not controlled by your nausea medication, call the clinic.   BELOW ARE SYMPTOMS THAT SHOULD BE REPORTED IMMEDIATELY:  *FEVER GREATER THAN 100.5 F  *CHILLS WITH OR WITHOUT FEVER  NAUSEA AND VOMITING THAT IS NOT CONTROLLED WITH YOUR NAUSEA MEDICATION  *UNUSUAL SHORTNESS OF BREATH  *UNUSUAL BRUISING OR BLEEDING  TENDERNESS IN MOUTH AND THROAT WITH OR WITHOUT PRESENCE OF ULCERS  *URINARY PROBLEMS  *BOWEL PROBLEMS  UNUSUAL RASH Items with * indicate a potential emergency and should be followed up as soon as possible.  Feel free to call the clinic you have any questions or concerns. The clinic phone number is [phone].  Please show the CHEMO ALERT CARD at check-in to the Emergency Department and triage nurse.   

## 2017-05-12 ENCOUNTER — Ambulatory Visit (HOSPITAL_BASED_OUTPATIENT_CLINIC_OR_DEPARTMENT_OTHER): Payer: BLUE CROSS/BLUE SHIELD

## 2017-05-12 VITALS — BP 122/62 | HR 59 | Temp 97.5°F | Resp 18

## 2017-05-12 DIAGNOSIS — Z7189 Other specified counseling: Secondary | ICD-10-CM

## 2017-05-12 DIAGNOSIS — C163 Malignant neoplasm of pyloric antrum: Secondary | ICD-10-CM

## 2017-05-12 DIAGNOSIS — C164 Malignant neoplasm of pylorus: Secondary | ICD-10-CM | POA: Diagnosis not present

## 2017-05-12 DIAGNOSIS — Z452 Encounter for adjustment and management of vascular access device: Secondary | ICD-10-CM | POA: Diagnosis not present

## 2017-05-12 MED ORDER — SODIUM CHLORIDE 0.9% FLUSH
10.0000 mL | INTRAVENOUS | Status: DC | PRN
  Administered 2017-05-12: 10 mL
  Filled 2017-05-12: qty 10

## 2017-05-12 MED ORDER — HEPARIN SOD (PORK) LOCK FLUSH 100 UNIT/ML IV SOLN
500.0000 [IU] | Freq: Once | INTRAVENOUS | Status: AC | PRN
  Administered 2017-05-12: 500 [IU]
  Filled 2017-05-12: qty 5

## 2017-05-12 NOTE — Patient Instructions (Signed)
Implanted Port Home Guide An implanted port is a type of central line that is placed under the skin. Central lines are used to provide IV access when treatment or nutrition needs to be given through a person's veins. Implanted ports are used for long-term IV access. An implanted port may be placed because:  You need IV medicine that would be irritating to the small veins in your hands or arms.  You need long-term IV medicines, such as antibiotics.  You need IV nutrition for a long period.  You need frequent blood draws for lab tests.  You need dialysis.  Implanted ports are usually placed in the chest area, but they can also be placed in the upper arm, the abdomen, or the leg. An implanted port has two main parts:  Reservoir. The reservoir is round and will appear as a small, raised area under your skin. The reservoir is the part where a needle is inserted to give medicines or draw blood.  Catheter. The catheter is a thin, flexible tube that extends from the reservoir. The catheter is placed into a large vein. Medicine that is inserted into the reservoir goes into the catheter and then into the vein.  How will I care for my incision site? Do not get the incision site wet. Bathe or shower as directed by your health care provider. How is my port accessed? Special steps must be taken to access the port:  Before the port is accessed, a numbing cream can be placed on the skin. This helps numb the skin over the port site.  Your health care provider uses a sterile technique to access the port. ? Your health care provider must put on a mask and sterile gloves. ? The skin over your port is cleaned carefully with an antiseptic and allowed to dry. ? The port is gently pinched between sterile gloves, and a needle is inserted into the port.  Only "non-coring" port needles should be used to access the port. Once the port is accessed, a blood return should be checked. This helps ensure that the port  is in the vein and is not clogged.  If your port needs to remain accessed for a constant infusion, a clear (transparent) bandage will be placed over the needle site. The bandage and needle will need to be changed every week, or as directed by your health care provider.  Keep the bandage covering the needle clean and dry. Do not get it wet. Follow your health care provider's instructions on how to take a shower or bath while the port is accessed.  If your port does not need to stay accessed, no bandage is needed over the port.  What is flushing? Flushing helps keep the port from getting clogged. Follow your health care provider's instructions on how and when to flush the port. Ports are usually flushed with saline solution or a medicine called heparin. The need for flushing will depend on how the port is used.  If the port is used for intermittent medicines or blood draws, the port will need to be flushed: ? After medicines have been given. ? After blood has been drawn. ? As part of routine maintenance.  If a constant infusion is running, the port may not need to be flushed.  How long will my port stay implanted? The port can stay in for as long as your health care provider thinks it is needed. When it is time for the port to come out, surgery will be   done to remove it. The procedure is similar to the one performed when the port was put in. When should I seek immediate medical care? When you have an implanted port, you should seek immediate medical care if:  You notice a bad smell coming from the incision site.  You have swelling, redness, or drainage at the incision site.  You have more swelling or pain at the port site or the surrounding area.  You have a fever that is not controlled with medicine.  This information is not intended to replace advice given to you by your health care provider. Make sure you discuss any questions you have with your health care provider. Document  Released: 10/31/2005 Document Revised: 04/07/2016 Document Reviewed: 07/08/2013 Elsevier Interactive Patient Education  2017 [employer].  

## 2017-05-25 ENCOUNTER — Telehealth: Payer: Self-pay

## 2017-05-25 NOTE — Telephone Encounter (Signed)
Letter faxed to pt employer addressing disability and treatment  r/t diagnosis. Confirmed fax receipt.

## 2017-05-31 ENCOUNTER — Ambulatory Visit (HOSPITAL_BASED_OUTPATIENT_CLINIC_OR_DEPARTMENT_OTHER): Payer: BLUE CROSS/BLUE SHIELD | Admitting: Hematology

## 2017-05-31 ENCOUNTER — Ambulatory Visit (HOSPITAL_BASED_OUTPATIENT_CLINIC_OR_DEPARTMENT_OTHER): Payer: BLUE CROSS/BLUE SHIELD

## 2017-05-31 ENCOUNTER — Ambulatory Visit: Payer: BLUE CROSS/BLUE SHIELD | Admitting: Nutrition

## 2017-05-31 ENCOUNTER — Encounter: Payer: Self-pay | Admitting: Hematology

## 2017-05-31 ENCOUNTER — Other Ambulatory Visit (HOSPITAL_BASED_OUTPATIENT_CLINIC_OR_DEPARTMENT_OTHER): Payer: BLUE CROSS/BLUE SHIELD

## 2017-05-31 ENCOUNTER — Other Ambulatory Visit (HOSPITAL_COMMUNITY)
Admission: AD | Admit: 2017-05-31 | Discharge: 2017-05-31 | Disposition: A | Discharge status: Home / Self Care | Payer: BLUE CROSS/BLUE SHIELD | Source: Ambulatory Visit | Attending: Hematology | Admitting: Hematology

## 2017-05-31 VITALS — BP 124/66 | HR 78 | Temp 98.1°F | Resp 18 | Ht 71.0 in | Wt 248.6 lb

## 2017-05-31 DIAGNOSIS — D630 Anemia in neoplastic disease: Secondary | ICD-10-CM | POA: Diagnosis not present

## 2017-05-31 DIAGNOSIS — C169 Malignant neoplasm of stomach, unspecified: Secondary | ICD-10-CM | POA: Insufficient documentation

## 2017-05-31 DIAGNOSIS — C163 Malignant neoplasm of pyloric antrum: Secondary | ICD-10-CM

## 2017-05-31 DIAGNOSIS — Z5111 Encounter for antineoplastic chemotherapy: Secondary | ICD-10-CM | POA: Diagnosis not present

## 2017-05-31 DIAGNOSIS — C164 Malignant neoplasm of pylorus: Secondary | ICD-10-CM

## 2017-05-31 DIAGNOSIS — E440 Moderate protein-calorie malnutrition: Secondary | ICD-10-CM | POA: Diagnosis not present

## 2017-05-31 DIAGNOSIS — Z7189 Other specified counseling: Secondary | ICD-10-CM

## 2017-05-31 LAB — CBC WITH DIFFERENTIAL/PLATELET
BASO%: 0.7 % (ref 0.0–2.0)
Basophils Absolute: 0 10*3/uL (ref 0.0–0.1)
EOS%: 1.4 % (ref 0.0–7.0)
Eosinophils Absolute: 0.1 10*3/uL (ref 0.0–0.5)
HCT: 34.8 % — ABNORMAL LOW (ref 38.4–49.9)
HGB: 10.9 g/dL — ABNORMAL LOW (ref 13.0–17.1)
LYMPH%: 34 % (ref 14.0–49.0)
MCH: 24.9 pg — ABNORMAL LOW (ref 27.2–33.4)
MCHC: 31.3 g/dL — ABNORMAL LOW (ref 32.0–36.0)
MCV: 79.5 fL (ref 79.3–98.0)
MONO#: 0.5 10*3/uL (ref 0.1–0.9)
MONO%: 11.3 % (ref 0.0–14.0)
NEUT#: 2.3 10*3/uL (ref 1.5–6.5)
NEUT%: 52.6 % (ref 39.0–75.0)
Platelets: 209 10*3/uL (ref 140–400)
RBC: 4.38 10*6/uL (ref 4.20–5.82)
RDW: 18.1 % — ABNORMAL HIGH (ref 11.0–14.6)
WBC: 4.4 10*3/uL (ref 4.0–10.3)
lymph#: 1.5 10*3/uL (ref 0.9–3.3)

## 2017-05-31 LAB — COMPREHENSIVE METABOLIC PANEL
ALT: 15 U/L (ref 0–55)
AST: 19 U/L (ref 5–34)
Albumin: 3.3 g/dL — ABNORMAL LOW (ref 3.5–5.0)
Alkaline Phosphatase: 83 U/L (ref 40–150)
Anion Gap: 12 mEq/L — ABNORMAL HIGH (ref 3–11)
BUN: 8.5 mg/dL (ref 7.0–26.0)
CO2: 23 mEq/L (ref 22–29)
Calcium: 9.6 mg/dL (ref 8.4–10.4)
Chloride: 106 mEq/L (ref 98–109)
Creatinine: 0.9 mg/dL (ref 0.7–1.3)
EGFR: >90 mL/min/{1.73_m2} (ref >90)
Glucose: 131 mg/dl (ref 70–140)
Potassium: 3.5 mEq/L (ref 3.5–5.1)
Sodium: 141 mEq/L (ref 136–145)
Total Bilirubin: 0.55 mg/dL (ref 0.20–1.20)
Total Protein: 7.2 g/dL (ref 6.4–8.3)

## 2017-05-31 LAB — PHOSPHORUS: Phosphorus: 4.1 mg/dL (ref 2.5–4.6)

## 2017-05-31 LAB — MAGNESIUM: Magnesium: 2 mg/dl (ref 1.5–2.5)

## 2017-05-31 MED ORDER — FLUOROURACIL CHEMO INJECTION 2.5 GM/50ML
400.0000 mg/m2 | Freq: Once | INTRAVENOUS | Status: AC
  Administered 2017-05-31: 950 mg via INTRAVENOUS
  Filled 2017-05-31: qty 19

## 2017-05-31 MED ORDER — PALONOSETRON HCL INJECTION 0.25 MG/5ML
INTRAVENOUS | Status: AC
  Filled 2017-05-31: qty 5

## 2017-05-31 MED ORDER — LEUCOVORIN CALCIUM INJECTION 350 MG
400.0000 mg/m2 | Freq: Once | INTRAVENOUS | Status: AC
  Administered 2017-05-31: 936 mg via INTRAVENOUS
  Filled 2017-05-31: qty 46.8

## 2017-05-31 MED ORDER — HEPARIN SOD (PORK) LOCK FLUSH 100 UNIT/ML IV SOLN
500.0000 [IU] | Freq: Once | INTRAVENOUS | Status: DC | PRN
  Filled 2017-05-31: qty 5

## 2017-05-31 MED ORDER — DEXAMETHASONE SODIUM PHOSPHATE 10 MG/ML IJ SOLN
10.0000 mg | Freq: Once | INTRAMUSCULAR | Status: AC
  Administered 2017-05-31: 10 mg via INTRAVENOUS

## 2017-05-31 MED ORDER — DEXTROSE 5 % IV SOLN
Freq: Once | INTRAVENOUS | Status: AC
  Administered 2017-05-31: 11:00:00 via INTRAVENOUS

## 2017-05-31 MED ORDER — DEXAMETHASONE SODIUM PHOSPHATE 10 MG/ML IJ SOLN
INTRAMUSCULAR | Status: AC
  Filled 2017-05-31: qty 1

## 2017-05-31 MED ORDER — OXALIPLATIN CHEMO INJECTION 100 MG/20ML
85.0000 mg/m2 | Freq: Once | INTRAVENOUS | Status: AC
  Administered 2017-05-31: 200 mg via INTRAVENOUS
  Filled 2017-05-31: qty 40

## 2017-05-31 MED ORDER — SODIUM CHLORIDE 0.9 % IV SOLN
2400.0000 mg/m2 | INTRAVENOUS | Status: DC
  Administered 2017-05-31: 5600 mg via INTRAVENOUS
  Filled 2017-05-31: qty 112

## 2017-05-31 MED ORDER — PALONOSETRON HCL INJECTION 0.25 MG/5ML
0.2500 mg | Freq: Once | INTRAVENOUS | Status: AC
  Administered 2017-05-31: 0.25 mg via INTRAVENOUS

## 2017-05-31 MED ORDER — SODIUM CHLORIDE 0.9% FLUSH
10.0000 mL | INTRAVENOUS | Status: DC | PRN
  Filled 2017-05-31: qty 10

## 2017-05-31 NOTE — Patient Instructions (Signed)
[facility] Discharge Instructions for Patients Receiving Chemotherapy  Today you received the following chemotherapy agents: Oxaliplatin, Leucovorin and Adrucil.  To help prevent nausea and vomiting after your treatment, we encourage you to take your nausea medication as directed. No Zofran for 3 days. Take Compazine instead.    If you develop nausea and vomiting that is not controlled by your nausea medication, call the clinic.   BELOW ARE SYMPTOMS THAT SHOULD BE REPORTED IMMEDIATELY:  *FEVER GREATER THAN 100.5 F  *CHILLS WITH OR WITHOUT FEVER  NAUSEA AND VOMITING THAT IS NOT CONTROLLED WITH YOUR NAUSEA MEDICATION  *UNUSUAL SHORTNESS OF BREATH  *UNUSUAL BRUISING OR BLEEDING  TENDERNESS IN MOUTH AND THROAT WITH OR WITHOUT PRESENCE OF ULCERS  *URINARY PROBLEMS  *BOWEL PROBLEMS  UNUSUAL RASH Items with * indicate a potential emergency and should be followed up as soon as possible.  Feel free to call the clinic you have any questions or concerns. The clinic phone number is [phone].  Please show the CHEMO ALERT CARD at check-in to the Emergency Department and triage nurse.   

## 2017-05-31 NOTE — Progress Notes (Signed)
Nutrition follow-up completed with patient during infusion for gastric cancer. Patient reports increased oral intake. States he has had to cut back on what he is eating because he has gained weight. Current weight 248.6 pounds July 18 improved from 244.3 pounds the 30th. Patient reports he has discontinued tube feeding via jejunostomy tube.  Approximately 6 days ago. He continues to have taste aversions.  Nutrition diagnosis: Inadequate oral intake improved.  Intervention: Educated patient to continue strategies for adequate calories and protein intake to promote weight maintenance. Patient educated to continue to flush feeding tube with water on a daily basis. Patient will discuss feeding tube removal with M.D. next appointment. Agree patient could discontinue jejunostomy feeding tube with continued increased oral intake and weight gain.  Monitoring, evaluation, goals:  Patient will continue to tolerate adequate calories and protein for weight maintenance and adequate healing.  Next visit: To be scheduled as needed.  **Disclaimer: This note was dictated with voice recognition software. Similar sounding words can inadvertently be transcribed and this note may contain transcription errors which may not have been corrected upon publication of note.**

## 2017-05-31 NOTE — Patient Instructions (Signed)
Thank you for choosing [facility] to provide your oncology and hematology care.  To afford each patient quality time with our providers, please arrive 30 minutes before your scheduled appointment time.  If you arrive late for your appointment, you may be asked to reschedule.  We strive to give you quality time with our providers, and arriving late affects you and other patients whose appointments are after yours.   If you are a no show for multiple scheduled visits, you may be dismissed from the clinic at the providers discretion.    Again, thank you for choosing [facility], our hope is that these requests will decrease the amount of time that you wait before being seen by our physicians.  ______________________________________________________________________  Should you have questions after your visit to the [facility], please contact our office at [phone] between the hours of 8:30 and 4:30 p.m.    Voicemails left after 4:30p.m will not be returned until the following business day.    For prescription refill requests, please have your pharmacy contact us directly.  Please also try to allow 48 hours for prescription requests.    Please contact the scheduling department for questions regarding scheduling.  For scheduling of procedures such as PET scans, CT scans, MRI, Ultrasound, etc please contact central scheduling at [phone].    Resources For Cancer Patients and Caregivers:   Oncolink.org:  A wonderful resource for patients and healthcare providers for information regarding your disease, ways to tract your treatment, what to expect, etc.     American Cancer Society:  800-227-2345  Can help patients locate various types of support and financial assistance  Cancer Care: 1-800-813-HOPE (4673) Provides financial assistance, online support groups, medication/co-pay assistance.    [place] DSS:  336-641-3447 Where to apply for food  stamps, Medicaid, and utility assistance  Medicare Rights Center: 800-333-4114 Helps people with Medicare understand their rights and benefits, navigate the Medicare system, and secure the quality healthcare they deserve  SCAT: 336-333-6589 [employer] [employer]'s shared-ride transportation service for eligible riders who have a disability that prevents them from riding the fixed route bus.    For additional information on assistance programs please contact our social worker:   Grier Hock/Abigail [place]:  [phone]            

## 2017-06-01 ENCOUNTER — Other Ambulatory Visit: Payer: Self-pay | Admitting: Hematology

## 2017-06-01 ENCOUNTER — Encounter: Payer: Self-pay | Admitting: Radiation Oncology

## 2017-06-01 NOTE — Progress Notes (Signed)
HEMATOLOGY/ONCOLOGY CLINIC NOTE  Date of Service: .05/31/2017  Patient Care Team: [name], MD as PCP - General (Internal Medicine) [name] (Surgery)   CHIEF COMPLAINTS/PURPOSE OF CONSULTATION:   f/u for gastric Adenocarcinoma.  HISTORY OF PRESENTING ILLNESS:   [name] is a wonderful 66 y.o. male is here for continued evaluation and management of recently diagnosed pT4a, pN3b, pM1 Gastric Adenocarcinoma.  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 Patient had presented to primary care physician with dyspeptic symptoms, partial gastric outlet obstruction and weight loss of 60 pounds. Patient has had a distal gastrectomy, cholecystectomy with jejunal feeding tube placement by Dr. Barry Dienes on 01/10/2017 for gastric obstruction secondary to adenocarcinoma of the gastric pylorus. He was noted to have invaded into the pancreas and a bit of the anterior pancreatic head was resected en bloc with the tumor.  Pathology showed  " INVASIVE MODERATELY TO POORLY DIFFERENTIATED ADENOCARCINOMA, SPANNING 4 CM IN GREATEST DIMENSION. TUMOR INVADES THROUGH STOMACH WALL TO INVOLVE SEROSAL SURFACE.  LYMPH/VASCULAR INVASION IS IDENTIFIED.  MARGINS ARE NEGATIVE.- FIFTEEN OF THIRTY NINE LYMPH NODES POSITIVE FOR METASTATIC ADENOCARCINOMA (15/39). EXTRACAPSULAR EXTENSION IS IDENTIFIED. 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  Patient had a prolonged hospitalization post surgery  due to significant NG tube output. Needed epidural and PCA for pain control. It took a while for him to be able to tolerate progression of 2 feeding. He had an abdominal drain due to a small collection near his pancreatic head. Was treated with Augmentin for this. Patient was in the hospital from 01/10/2017 and  was eventually discharged on 01/27/2017.  Patient postponed several attempts at posthospitalization follow-up. He has been recovering at home and has been optimizing his tube feeding. Patient was recently followed up by Dr. Barry Dienes and has had a port placement on 02/23/2017 and preparation for possible chemotherapy.  He notes that his tube feeding is up to 40 mL per hour for 12 hours and has targeted is 16 ml per hour. He was recently admitted to [facility] in [place] for a C. difficile colitis that caused a severe diarrhea. he is currently on oral vancomycin has completed about 5 days of treatment and is also on probiotics.  INTERVAL HISTORY  Patient is here for f/u prior to his 5th cycle of FOLFOX. Improving po intake and appetite and notes progressive weight gain and that his tube feeding was stopped about 5 days ago. No significant diarrhea. Sleeping better. Minimal irritation around feeding tube - using Bactracin-Zn ointment. Notes that he wants to discuss with Dr. Barry Dienes about removing the feeding tube . We discussed that it might be reasonable to keep it in while radiation is being considered since it can cause some additional symptoms that might affect his by mouth intake . No other acute new concerns.  MEDICAL HISTORY:  Past Medical History:  Diagnosis Date  . Anemia   . Cancer [facility])    Gastric Adenocarcinoma  . Gastric outlet obstruction 01/2017  . GERD (gastroesophageal reflux disease)   . Pre-diabetes    denies  . SVT (supraventricular tachycardia) ([place])    during Admission and Surgery- 01/2017    SURGICAL HISTORY: Past Surgical History:  Procedure Laterality Date  . CHOLECYSTECTOMY N/A 01/10/2017   Procedure: CHOLECYSTECTOMY;  Surgeon: Stark Klein, MD;  Location: [place];  Service: General;  Laterality:  N/A;  . COLONOSCOPY    . COLONOSCOPY  10/11/2011   Procedure: COLONOSCOPY;  Surgeon: Daneil Dolin, MD;  [facility];  Service: Endoscopy;  Laterality: N/A;  10:30 AM    . ESOPHAGOGASTRODUODENOSCOPY N/A 12/30/2016   Procedure: ESOPHAGOGASTRODUODENOSCOPY (EGD);  Surgeon: Carol Ada, MD;  [facility];  Service: Endoscopy;  Laterality: N/A;  . GASTRECTOMY N/A 01/10/2017   Procedure: DISTAL GASTRECTOMY, JEJUNUM  FEEDING TUBE PLACEMENT;  Surgeon: Stark Klein, MD;  Location: [place];  Service: General;  Laterality: N/A;  . IR GENERIC HISTORICAL  01/24/2017   IR [place] DUODEN/JEJUNO TUBE PERCUT W/FLUORO 01/24/2017 Aletta Edouard, MD [facility]-INTER[facility]  . IR REPLC DUODEN/JEJUNO TUBE PERCUT W/FLUORO  03/26/2017  . PORTACATH PLACEMENT N/A 02/23/2017   Procedure: INSERTION PORT-A-CATH;  Surgeon: Stark Klein, MD;  Location: [place];  Service: General;  Laterality: N/A;  . testicle removed  1982    SOCIAL HISTORY: Social History   Social History  . Marital status: Married    Spouse name: N/A  . Number of children: N/A  . Years of education: N/A   Occupational History  . Not on file.   Social History Main Topics  . Smoking status: Never Smoker  . Smokeless tobacco: Never Used  . Alcohol use No     Comment: occ beer or wine but none in last 6 months   . Drug use: No  . Sexual activity: Not on file   Other Topics Concern  . Not on file   Social History Narrative  . No narrative on file    FAMILY HISTORY: Family History  Problem Relation Age of Onset  . Prostate cancer Father   . Colon cancer Neg Hx     ALLERGIES:  is allergic to no known allergies.  MEDICATIONS:  Current Outpatient Prescriptions  Medication Sig Dispense Refill  . Banana Flakes (BANATROL PO) Place 1 packet into feeding tube daily as needed (diarrhea).    . chlorproMAZINE (THORAZINE) 100 MG tablet Take 1 tablet (100 mg total) by mouth 4 (four) times daily as needed for hiccoughs. 20 tablet 0  . dexamethasone (DECADRON) 4 MG tablet Take 2 tablets (8 mg total) by mouth daily. Start the day after chemotherapy for 2 days. Take with food. 30 tablet 1  . famotidine (PEPCID) 20 MG  tablet Take 20 mg by mouth 2 (two) times daily.    . ferrous sulfate 325 (65 FE) MG tablet Take 325 mg by mouth daily with breakfast.    . HYDROcodone-acetaminophen (NORCO/VICODIN) 5-325 MG tablet Take 1-2 tablets by mouth every 6 (six) hours as needed for moderate pain. 30 tablet 0  . lidocaine-prilocaine (EMLA) cream Apply to affected area once (Patient taking differently: Apply 1 application topically See admin instructions. Apply to port access one hour prior to chemo) 30 g 3  . Nutritional Supplements (NUTREN 1.5) LIQD Place 60 mLs into feeding tube every 12 (twelve) hours.     . ondansetron (ZOFRAN) 8 MG tablet Take 1 tablet (8 mg total) by mouth 2 (two) times daily as needed for refractory nausea / vomiting. Start on day 3 after chemotherapy. 30 tablet 1  . prochlorperazine (COMPAZINE) 10 MG tablet Take 1 tablet (10 mg total) by mouth every 6 (six) hours as needed (Nausea or vomiting). 30 tablet 1  . traZODone (DESYREL) 50 MG tablet Take 1 tablet (50 mg total) by mouth at bedtime as needed for sleep. 30 tablet 1   No current facility-administered medications for this  visit.     REVIEW OF SYSTEMS:    10 Point review of Systems was done is negative except as noted above.  PHYSICAL EXAMINATION: ECOG PERFORMANCE STATUS: 2 - Symptomatic, <50% confined to bed  . Vitals:   05/31/17 0924  BP: 124/66  Pulse: 78  Resp: 18  Temp: 98.1 F (36.7 C)   Filed Weights   05/31/17 0924  Weight: 248 lb 9.6 oz (112.8 kg)   .Body mass index is 34.67 kg/m.  GENERAL:alert, in no acute distress and comfortable SKIN: no acute rashes, no significant lesions EYES: conjunctiva are pink and non-injected, sclera anicteric OROPHARYNX: MMM, no exudates, no oropharyngeal erythema or ulceration NECK: supple, no JVD LYMPH:  no palpable lymphadenopathy in the cervical, axillary or inguinal regions LUNGS: clear to auscultation b/l with normal respiratory effort HEART: regular rate & rhythm ABDOMEN:   normoactive bowel sounds , non tender, not distended. Extremity: no pedal edema PSYCH: alert & oriented x 3 with fluent speech NEURO: no focal motor/sensory deficits  LABORATORY DATA:  I have reviewed the data as listed  . CBC Latest Ref Rng & Units 05/31/2017 05/10/2017 04/26/2017  WBC 4.0 - 10.3 10e3/uL 4.4 5.3 5.2  Hemoglobin 13.0 - 17.1 g/dL 10.9(L) 9.9(L) 10.3(L)  Hematocrit 38.4 - 49.9 % 34.8(L) 32.5(L) 33.6(L)  Platelets 140 - 400 10e3/uL 209 129(L) 187    . CMP Latest Ref Rng & Units 05/31/2017 05/10/2017 04/26/2017  Glucose 70 - 140 mg/dl 131 152(H) 152(H)  BUN 7.0 - 26.0 mg/dL 8.5 12.2 9.3  Creatinine 0.7 - 1.3 mg/dL 0.9 0.9 0.9  Sodium 136 - 145 mEq/L 141 142 140  Potassium 3.5 - 5.1 mEq/L 3.5 3.9 4.0  Chloride 101 - 111 mmol/L - - -  CO2 22 - 29 mEq/L 23 26 26   Calcium 8.4 - 10.4 mg/dL 9.6 9.4 9.7  Total Protein 6.4 - 8.3 g/dL 7.2 7.1 7.3  Total Bilirubin 0.20 - 1.20 mg/dL 0.55 0.51 0.61  Alkaline Phos 40 - 150 U/L 83 77 89  AST 5 - 34 U/L 19 18 18   ALT 0 - 55 U/L 15 18 16       RADIOGRAPHIC STUDIES: I have personally reviewed the radiological images as listed and agreed with the findings in the report. No results found.  ASSESSMENT & PLAN:   66 year old African-American male with  1) Resected Stage IV pT4a, pN3b, pM1 invasive moderately-poorly Gastric Adenocarcinoma . Her 2 neg by FISH LVI + margins neg 15/39 LN +ve, Extracapsular invasion +, some involvement of pancreatic capsule there pM1 Presented with severe pyloric stenosis with ulceration. 60 pound weight loss. S/p distal gastrectomy, cholecystectomy and en bloc dissection off the small part of the pancreatic head  with jejunal feeding tube placement. Initial CT abdomen and pelvis do not show any local regional lymphadenopathy. Rpt CT chest/abd/pelvis from 03/22/2017 - no overt evidence of residual disease or metastatic disease. Post-surgical peripancreatic fluid collection resolving.  #2 moderate  protein calorie malnutrition patient had lost about 60 pounds in the last 2-3 months but has been gaining back his lost weight well. . Wt Readings from Last 3 Encounters:  05/31/17 248 lb 9.6 oz (112.8 kg)  04/26/17 247 lb 3.2 oz (112.1 kg)  04/12/17 244 lb 5 oz (110.8 kg)   -his po intake has improved and he is gaining back his lost weight. -Has been off his jejunal tube feeding for the last 5 days given good by mouth intake.  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  #4 status post port placement on 02/23/2017   #5 recent Clostridium difficile colitis -on suppressive PO vancomycin. No overt issues with diarrhea currently. Has completed his prolonged po vancomycin course. Plan  -no prohibitive toxicities from C4 of FOLFOX. Chemotherapy -labs stable and we shall proceed with C5 of FOLFOX chemotherapy. -plan to complete 3 months /6 cycles of FOLFOX and then get a restaging CT scan of CT chest abdomen pelvis. -If no overt evidence of recurrent or progressive metastatic disease we will consider possible chemoradiation for better local disease control. -CT scan has been ordered and needs to be scheduled. -Patient has been given a referral to radiation oncology to evaluate for concurrent chemoradiation with Xeloda or 5-FU/leucovorin --continue f/u with Dory Peru for continue dietary management. He has been osteopenic for about 5 days and has been eating well . -He has follow-up with Dr. Barry Dienes to discuss the status of his jejunal tube . Might need to consider keeping it in while the patient gets concurrent chemoradiation to the possibility of GI symptoms that might again limit his by mouth intake .  #4 suspected sleep apnea.Has not had a sleep study.Wife notes significant snoring which is important with weight loss. Will have to be careful with excessive sedation. -continue prn low dose trazodone for insomnia -recommended he f/u with his  PCP for consideration of sleep study-pending   Complete planned 6 cycles of FOLFOX CT chest/abd/pelvis in 3 weeks (1 week after C6 of FOLFOX) - order placed on 6/13 -not yet scheduled. Radiation oncology referral to consider concurrent chemo-radiation (appointment in 1-2 weeks) RTC with Dr Irene Limbo in 3-4 weeks with labs and CT chest/abd/pelvis results  All of the patients questions were answered with apparent satisfaction. The patient knows to call the clinic with any problems, questions or concerns.  I spent 20 minutes counseling the patient face to face. The total time spent in the appointment was 30 minutes and more than 50% was on counseling and direct patient cares.   All of the patients questions were answered with apparent satisfaction. The patient knows to call the clinic with any problems, questions or concerns.   Sullivan Lone MD [place] AAHIVMS [facility] Hematology/Oncology Physician [facility]  (Office):       [phone] (Work cell):  [phone] (Fax):           [phone]

## 2017-06-02 ENCOUNTER — Ambulatory Visit (HOSPITAL_BASED_OUTPATIENT_CLINIC_OR_DEPARTMENT_OTHER): Payer: BLUE CROSS/BLUE SHIELD

## 2017-06-02 VITALS — BP 123/64 | HR 76 | Temp 97.9°F | Resp 18

## 2017-06-02 DIAGNOSIS — C163 Malignant neoplasm of pyloric antrum: Secondary | ICD-10-CM

## 2017-06-02 DIAGNOSIS — Z7189 Other specified counseling: Secondary | ICD-10-CM

## 2017-06-02 DIAGNOSIS — C164 Malignant neoplasm of pylorus: Secondary | ICD-10-CM | POA: Diagnosis not present

## 2017-06-02 DIAGNOSIS — Z452 Encounter for adjustment and management of vascular access device: Secondary | ICD-10-CM

## 2017-06-02 MED ORDER — HEPARIN SOD (PORK) LOCK FLUSH 100 UNIT/ML IV SOLN
500.0000 [IU] | Freq: Once | INTRAVENOUS | Status: AC | PRN
  Administered 2017-06-02: 500 [IU]
  Filled 2017-06-02: qty 5

## 2017-06-02 MED ORDER — SODIUM CHLORIDE 0.9% FLUSH
10.0000 mL | INTRAVENOUS | Status: DC | PRN
  Administered 2017-06-02: 10 mL
  Filled 2017-06-02: qty 10

## 2017-06-02 NOTE — Patient Instructions (Signed)
Implanted Port Insertion  Implanted port insertion is a procedure to put in a port and catheter. The port is a device with an injectable disk that can be accessed by your health care provider. The port is connected to a vein in the chest or neck by a small flexible tube (catheter). There are different types of ports. The implanted port may be used as a long-term IV access for:  · Medicines, such as chemotherapy.  · Fluids.  · Liquid nutrition, such as total parenteral nutrition (TPN).  · Blood samples.    Having a port means that your health care provider will not need to use the veins in your arms for these procedures.  Tell a health care provider about:  · Any allergies you have.  · All medicines you are taking, especially blood thinners, as well as any vitamins, herbs, eye drops, creams, over-the-counter medicines, and steroids.  · Any problems you or family members have had with anesthetic medicines.  · Any blood disorders you have.  · Any surgeries you have had.  · Any medical conditions you have, including diabetes or kidney problems.  · Whether you are pregnant or may be pregnant.  What are the risks?  Generally, this is a safe procedure. However, problems may occur, including:  · Allergic reactions to medicines or dyes.  · Damage to other structures or organs.  · Infection.  · Damage to the blood vessel, bruising, or bleeding at the puncture site.  · Blood clot.  · Breakdown of the skin over the port.  · A collection of air in the chest that can cause one of the lungs to collapse (pneumothorax). This is rare.    What happens before the procedure?  Staying hydrated  Follow instructions from your health care provider about hydration, which may include:  · Up to 2 hours before the procedure - you may continue to drink clear liquids, such as water, clear fruit juice, black coffee, and plain tea.    Eating and drinking restrictions  · Follow instructions from your health care provider about eating and drinking,  which may include:  ? 8 hours before the procedure - stop eating heavy meals or foods such as meat, fried foods, or fatty foods.  ? 6 hours before the procedure - stop eating light meals or foods, such as toast or cereal.  ? 6 hours before the procedure - stop drinking milk or drinks that contain milk.  ? 2 hours before the procedure - stop drinking clear liquids.  Medicines  · Ask your health care provider about:  ? Changing or stopping your regular medicines. This is especially important if you are taking diabetes medicines or blood thinners.  ? Taking medicines such as aspirin and ibuprofen. These medicines can thin your blood. Do not take these medicines before your procedure if your health care provider instructs you not to.  · You may be given antibiotic medicine to help prevent infection.  General instructions  · Plan to have someone take you home from the hospital or clinic.  · If you will be going home right after the procedure, plan to have someone with you for 24 hours.  · You may have blood tests.  · You may be asked to shower with a germ-killing soap.  What happens during the procedure?  · To lower your risk of infection:  ? Your health care team will wash or sanitize their hands.  ? Your skin will be washed with   soap.  ? Hair may be removed from the surgical area.  · An IV tube will be inserted into one of your veins.  · You will be given one or more of the following:  ? A medicine to help you relax (sedative).  ? A medicine to numb the area (local anesthetic).  · Two small cuts (incisions) will be made to insert the port.  ? One incision will be made in your neck to get access to the vein where the catheter will lie.  ? The other incision will be made in the upper chest. This is where the port will lie.  · The procedure may be done using continuous X-ray (fluoroscopy) or other imaging tools for guidance.  · The port and catheter will be placed. There may be a small, raised area where the port  is.  · The port will be flushed with a salt solution (saline), and blood will be drawn to make sure that it is working correctly.  · The incisions will be closed.  · Bandages (dressings) may be placed over the incisions.  The procedure may vary among health care providers and hospitals.  What happens after the procedure?  · Your blood pressure, heart rate, breathing rate, and blood oxygen level will be monitored until the medicines you were given have worn off.  · Do not drive for 24 hours if you were given a sedative.  · You will be given a manufacturer's information card for the type of port that you have. Keep this with you.  · Your port will need to be flushed and checked as told by your health care provider, usually every few weeks.  · A chest X-ray will be done to:  ? Check the placement of the port.  ? Make sure there is no injury to your lung.  Summary  · Implanted port insertion is a procedure to put in a port and catheter.  · The implanted port is used as a long-term IV access.  · The port will need to be flushed and checked as told by your health care provider, usually every few weeks.  · Keep your manufacturer's information card with you at all times.  This information is not intended to replace advice given to you by your health care provider. Make sure you discuss any questions you have with your health care provider.  Document Released: 08/21/2013 Document Revised: 09/21/2016 Document Reviewed: 09/21/2016  Elsevier Interactive Patient Education © 2017 [employer]

## 2017-06-06 NOTE — Progress Notes (Signed)
GI Location of Tumor / Histology: Gastric Pylorus  [name] presented to Dr. Irene Limbo in February 2018 with new diagnosis of Gastric Adenocarcinoma.  Patient had symptoms that started Thanksgiving of 2017 with progressive dyspeptic symptoms, acid reflux and abdominal fullness after meals. He was initially treated with Zantac without any benefits and then treated empirically for Helicobacter pylori infection by his primary care physician. He lost about 60 pounds over the last 2-3 months.  Since his symptoms persisted he was eventually referred to gastroenterology and had a CT of the chest abdomen pelvis on 12/26/2016 which showed prominent distention of the stomach with food debris. Mild annular wall thickening in the gastric antrum. No significant perigastric lymphadenopathy noted.  Patient had an EGD on 12/30/2016 which showed malignant appearing intrinsic severe stenosis at the gastric pylorus. This was transversed with a pediatric endoscope. Stenosis was noted to be severe. Biopsy was taken of the ulceration at the pylorus.  Biopsy was consistent with adenocarcinoma of the stomach along with marked acute and chronic inflammation. Warthin Starry stain was negative for Helicobacter pylori.  Biopsies of stomach revealed: 01/10/2017    Past/Anticipated interventions by surgeon, if any:  12/30/2016 Procedure: ESOPHAGOGASTRODUODENOSCOPY (EGD);  Surgeon: Carol Ada, MD;  Location[facility];  Service: Endoscopy;  Laterality: N/A;  01/10/2017 Procedure: DISTAL GASTRECTOMY, JEJUNUM  FEEDING TUBE PLACEMENT;  Surgeon: Stark Klein, MD;  Location: [place];  Service: General;  Laterality: N/A;  01/24/2017 IR [place] DUODEN/JEJUNO TUBE Eber Jones Vivianne Master 01/24/2017 Aletta Edouard, MD [facility]   Past/Anticipated interventions by medical oncology, if any: Completed 5 of 6 cycles of FOLFOX, CT ordered for restaging and will determine if need for further treatment of Xeloda or 5-FU/leucovorin  Weight  changes, if any: initially lost 60 lbs over 2-3 months, after Jejuno tube placed and patient was able to tolerate feeds he started gaining some weight back.  Recently has started taking food po and stopped using Jejuno.  Was recommended by Dr. Irene Limbo that patient keep Jejuno tube just in case if additional GI symptoms occur.  Bowel/Bladder complaints, if an:d : regular bowels, frequency voiding,   Nausea / Vomiting, if any: No  Pain issues, if any:  NO  SAFETY ISSUES: NO  Prior radiation? no  Pacemaker/ICD? no  Possible current pregnancy? no  Is the patient on methotrexate? no  Current Complaints/Details:  Mr [name] is married.  Patient is currently being followed by Dory Peru for nutrition consultation.  Pertinent Health History includes: SVT, GERD, Pre-diabetes, Anemia.  BP 137/84 Comment: RFA sitting  Pulse 96   Temp 98.2 F (36.8 C) (Oral)   Resp 20   Ht 5\' 11"  (1.803 m)   Wt 241 lb 9.6 oz (109.6 kg)   SpO2 100%   BMI 33.70 kg/m   Wt Readings from Last 3 Encounters:  06/08/17 241 lb 9.6 oz (109.6 kg)  05/31/17 248 lb 9.6 oz (112.8 kg)  04/26/17 247 lb 3.2 oz (112.1 kg)

## 2017-06-08 ENCOUNTER — Ambulatory Visit
Admission: RE | Admit: 2017-06-08 | Discharge: 2017-06-08 | Disposition: A | Discharge status: Home / Self Care | Payer: BLUE CROSS/BLUE SHIELD | Source: Ambulatory Visit | Attending: Radiation Oncology | Admitting: Radiation Oncology

## 2017-06-08 ENCOUNTER — Ambulatory Visit
Admission: RE | Admit: 2017-06-08 | Payer: BLUE CROSS/BLUE SHIELD | Source: Ambulatory Visit | Admitting: Radiation Oncology

## 2017-06-08 DIAGNOSIS — C259 Malignant neoplasm of pancreas, unspecified: Secondary | ICD-10-CM | POA: Insufficient documentation

## 2017-06-08 DIAGNOSIS — Z79899 Other long term (current) drug therapy: Secondary | ICD-10-CM | POA: Diagnosis not present

## 2017-06-08 DIAGNOSIS — Z9049 Acquired absence of other specified parts of digestive tract: Secondary | ICD-10-CM | POA: Insufficient documentation

## 2017-06-08 DIAGNOSIS — Z800 Family history of malignant neoplasm of digestive organs: Secondary | ICD-10-CM | POA: Diagnosis not present

## 2017-06-08 DIAGNOSIS — R7303 Prediabetes: Secondary | ICD-10-CM | POA: Diagnosis not present

## 2017-06-08 DIAGNOSIS — C160 Malignant neoplasm of cardia: Secondary | ICD-10-CM | POA: Diagnosis present

## 2017-06-08 DIAGNOSIS — Z9889 Other specified postprocedural states: Secondary | ICD-10-CM | POA: Diagnosis not present

## 2017-06-08 DIAGNOSIS — Z79891 Long term (current) use of opiate analgesic: Secondary | ICD-10-CM | POA: Insufficient documentation

## 2017-06-08 DIAGNOSIS — Z8042 Family history of malignant neoplasm of prostate: Secondary | ICD-10-CM | POA: Diagnosis not present

## 2017-06-08 DIAGNOSIS — I471 Supraventricular tachycardia: Secondary | ICD-10-CM | POA: Insufficient documentation

## 2017-06-08 DIAGNOSIS — Z9221 Personal history of antineoplastic chemotherapy: Secondary | ICD-10-CM | POA: Insufficient documentation

## 2017-06-08 DIAGNOSIS — Z510 Encounter for antineoplastic radiation therapy: Secondary | ICD-10-CM | POA: Insufficient documentation

## 2017-06-08 DIAGNOSIS — K219 Gastro-esophageal reflux disease without esophagitis: Secondary | ICD-10-CM | POA: Insufficient documentation

## 2017-06-08 NOTE — Progress Notes (Signed)
Radiation Oncology         [phone] ________________________________  Name: [name] MRN: [id number]  Date: 06/08/2017  DOB: 1951/07/19  EZ:[id number], [name], MD  Brunetta Genera, MD     REFERRING PHYSICIAN: Brunetta Genera, MD   DIAGNOSIS: Stage IV pT4a, pN3b, pM1 invasive moderately-poorly Gastric Adenocarcinoma . Her 2 neg by FISH  HISTORY OF PRESENT ILLNESS: [name] is a 66 y.o. male seen at the request of Dr. Irene Limbo for a history of metastatic gastric cancer. Beginning in November 2017, the patient experienced progressive dyspeptic symptoms, acid reflux, and abdominal fullness after meals. He was initially treated with Zantac without any relief and eventually was treated for Helicobacter pylori infection by PCP. He lost 60 lbs overa few month's time and  underwent chest/abdomen/pelvis CT on 12/26/16. This showed prominent distention of the stomach with food debris. Mild annular wall thickening in the gastric antrum was noted. No  Significant perigastric lymphadenopathy was seen. An EGD on 12/30/16 which showed malignant appearing intrinsic severe stenosis at the gastric pylorus. This was transversed with a pediatric endoscope. Stenosis was noted to be severe.   Biopsy was taken of the ulceration at the pylorus and findings were consistent with adenocarcinoma of the stomach along with marked acute and chronic inflammation. Warthin Starry stain was negative for Helicobacter pylori. On 01/10/17, Dr. Barry Dienes performed a distal gastrectomy, cholecystectomy with jejunal feeding tube placement. It was noted to have invaded into the pancreas and a portion of the anterior head was resected en bloc with the tumor.  Pathology revealed INVASIVE MODERATELY TO POORLY DIFFERENTIATED ADENOCARCINOMA, SPANNING 4 CM IN GREATEST DIMENSION. TUMOR INVADES THROUGH STOMACH WALL TO INVOLVE SEROSAL SURFACE. LYMPH/VASCULAR INVASION IS IDENTIFIED. MARGINS ARE NEGATIVE.- FIFTEEN OF THIRTY NINE  LYMPH NODES POSITIVE FOR METASTATIC ADENOCARCINOMA (15/39). EXTRACAPSULAR EXTENSION IS IDENTIFIED. - SMALL FRAGMENT OF PANCREATIC TISSUE PRESENT WITH ADJACENT METASTATIC ADENOCARCINOMA INVOLVING THE PANCREATIC CAPSULE  The patient has since undergone chemotherapy treatment with FOLFOX x 5 of 6 cycles under the supervision of Dr. Irene Limbo. Repeat CT on 03/21/2017 revealed s/p gastrojejunostomy with decreased size of a peripancreatic complex fluid collection. Apparent wall thickening of the ascending colon was noted, adjacent to the fluid collection. This was likely partially due to underdistention though colitis cannot be excluded. Gastric wall thickening was also seen, just proximal to anastomosis. Dr. Irene Limbo has ordered a CT for restaging, and comes today to discuss the options of radiotherapy   PREVIOUS RADIATION THERAPY: No   PAST MEDICAL HISTORY:  Past Medical History:  Diagnosis Date  . Anemia   . Cancer [facility] [place])    Gastric Adenocarcinoma  . Gastric outlet obstruction 01/2017  . GERD (gastroesophageal reflux disease)   . Pre-diabetes    denies  . SVT (supraventricular tachycardia) ([place])    during Admission and Surgery- 01/2017       PAST SURGICAL HISTORY: Past Surgical History:  Procedure Laterality Date  . CHOLECYSTECTOMY N/A 01/10/2017   Procedure: CHOLECYSTECTOMY;  Surgeon: Stark Klein, MD;  Location: [facility];  Service: General;  Laterality: N/A;  . COLONOSCOPY    . COLONOSCOPY  10/11/2011   Procedure: COLONOSCOPY;  Surgeon: Daneil Dolin, MD;  [facility];  Service: Endoscopy;  Laterality: N/A;  10:30 AM  . ESOPHAGOGASTRODUODENOSCOPY N/A 12/30/2016   Procedure: ESOPHAGOGASTRODUODENOSCOPY (EGD);  Surgeon: Carol Ada, MD;  Location[facility];  Service: Endoscopy;  Laterality: N/A;  . GASTRECTOMY N/A 01/10/2017   Procedure: DISTAL GASTRECTOMY, JEJUNUM  FEEDING TUBE PLACEMENT;  Surgeon: Dorris Fetch  Barry Dienes, MD;  Location: [facility];  Service: General;  Laterality: N/A;  . IR GENERIC  HISTORICAL  01/24/2017   IR [place] DUODEN/JEJUNO TUBE PERCUT W/FLUORO 01/24/2017 Aletta Edouard, MD [facility]-INTERV RAD  . IR REPLC DUODEN/JEJUNO TUBE PERCUT W/FLUORO  03/26/2017  . PORTACATH PLACEMENT N/A 02/23/2017   Procedure: INSERTION PORT-A-CATH;  Surgeon: Stark Klein, MD;  Location: [facility];  Service: General;  Laterality: N/A;  . testicle removed  1982     FAMILY HISTORY:  Family History  Problem Relation Age of Onset  . Prostate cancer Father   . Colon cancer Neg Hx      SOCIAL HISTORY:  reports that he has never smoked. He has never used smokeless tobacco. He reports that he does not drink alcohol or use drugs. 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 His wife lives in [place] full time.   ALLERGIES: No known allergies   MEDICATIONS:  Current Outpatient Prescriptions  Medication Sig Dispense Refill  . dexamethasone (DECADRON) 4 MG tablet Take 2 tablets (8 mg total) by mouth daily. Start the day after chemotherapy for 2 days. Take with food. 30 tablet 1  . famotidine (PEPCID) 20 MG tablet Take 20 mg by mouth daily.     . ferrous sulfate 325 (65 FE) MG tablet Take 325 mg by mouth daily with breakfast.    . HYDROcodone-acetaminophen (NORCO/VICODIN) 5-325 MG tablet Take 1-2 tablets by mouth every 6 (six) hours as needed for moderate pain. 30 tablet 0  . lidocaine-prilocaine (EMLA) cream Apply to affected area once (Patient taking differently: Apply 1 application topically See admin instructions. Apply to port access one hour prior to chemo) 30 g 3  . chlorproMAZINE (THORAZINE) 100 MG tablet Take 1 tablet (100 mg total) by mouth 4 (four) times daily as needed for hiccoughs. (Patient not taking: Reported on 06/08/2017) 20 tablet 0  . ondansetron (ZOFRAN) 8 MG tablet Take 1 tablet (8 mg total) by mouth 2 (two) times daily as needed for refractory nausea / vomiting.  Start on day 3 after chemotherapy. (Patient not taking: Reported on 06/08/2017) 30 tablet 1  . prochlorperazine (COMPAZINE) 10 MG tablet Take 1 tablet (10 mg total) by mouth every 6 (six) hours as needed (Nausea or vomiting). (Patient not taking: Reported on 06/08/2017) 30 tablet 1  . traZODone (DESYREL) 50 MG tablet TAKE 1 TABLET (50 MG TOTAL) BY MOUTH AT BEDTIME AS NEEDED FOR SLEEP. (Patient not taking: Reported on 06/08/2017) 30 tablet 0   No current facility-administered medications for this encounter.      REVIEW OF SYSTEMS: On review of systems, the patient reports that he is doing well overall. He denies any chest pain, shortness of breath, cough, fevers, chills, night sweats, unintended weight changes. He denies any bowel or bladder disturbances, and denies abdominal pain, nausea or vomiting. He is eating a regular diet and has not had any trouble since having his j tube removed on Tuesday. He is maintaining his weight currently. He denies any new musculoskeletal or joint aches or pains. A complete review of systems is obtained and is otherwise negative.     PHYSICAL EXAM:  Wt Readings from Last 3 Encounters:  06/08/17 241 lb 9.6 oz (109.6 kg)  05/31/17 248 lb 9.6 oz (112.8 kg)  04/26/17 247 lb 3.2 oz (112.1 kg)   Temp Readings from Last 3 Encounters:  06/08/17  98.2 F (36.8 C) (Oral)  06/02/17 97.9 F (36.6 C) (Oral)  05/31/17 98.1 F (36.7 C) (Oral)   BP Readings from Last 3 Encounters:  06/08/17 137/84  06/02/17 123/64  05/31/17 124/66   Pulse Readings from Last 3 Encounters:  06/08/17 96  06/02/17 76  05/31/17 78   Pain Assessment Pain Score: 0-No pain/10  In general this is a well appearing African American male in no acute distress. He is alert and oriented x4 and appropriate throughout the examination. HEENT reveals that the patient is normocephalic, atraumatic. EOMs are intact. PERRLA. Skin is intact without any evidence of gross lesions. Cardiovascular exam  reveals a regular rate and rhythm, no clicks rubs or murmurs are auscultated. Chest is clear to auscultation bilaterally. Lymphatic assessment is performed and does not reveal any adenopathy in the cervical, supraclavicular, axillary, or inguinal chains. Abdomen has active bowel sounds in all quadrants and is intact. The abdomen is soft, non tender, non distended. Lower extremities are negative for pretibial pitting edema, deep calf tenderness, cyanosis or clubbing. He has a well healed laparotomy scar. His prior j tube site is dressed without drainage on his dressing.   ECOG = 1  0 - Asymptomatic (Fully active, able to carry on all predisease activities without restriction)  1 - Symptomatic but completely ambulatory (Restricted in physically strenuous activity but ambulatory and able to carry out work of a light or sedentary nature. For example, light housework, office work)  2 - Symptomatic, <50% in bed during the day (Ambulatory and capable of all self care but unable to carry out any work activities. Up and about more than 50% of waking hours)  3 - Symptomatic, >50% in bed, but not bedbound (Capable of only limited self-care, confined to bed or chair 50% or more of waking hours)  4 - Bedbound (Completely disabled. Cannot carry on any self-care. Totally confined to bed or chair)  5 - Death   Eustace Pen MM, Creech RH, Tormey DC, et al. [phone]). "Toxicity and response criteria of the [facility]". [place] Oncol. 5 (6): 649-55    LABORATORY DATA:  Lab Results  Component Value Date   WBC 4.4 05/31/2017   HGB 10.9 (L) 05/31/2017   HCT 34.8 (L) 05/31/2017   MCV 79.5 05/31/2017   PLT 209 05/31/2017   Lab Results  Component Value Date   NA 141 05/31/2017   K 3.5 05/31/2017   CL 101 03/26/2017   CO2 23 05/31/2017   Lab Results  Component Value Date   ALT 15 05/31/2017   AST 19 05/31/2017   ALKPHOS 83 05/31/2017   BILITOT 0.55 05/31/2017      RADIOGRAPHY:  No results found.     IMPRESSION/PLAN: 1. Stage IV pT4a, pN3b, pM1 invasive moderately-poorly Gastric Adenocarcinoma. Dr. Lisbeth Renshaw discusses the pathology findings and reviews the nature of gastric cancer. Dr. Lisbeth Renshaw outlines his previous course of chemotherapy, and would like to see the patient's post treatment imaging prior to determining if we would move forward with external radiotherapy and chemosensitization. As the patient has completed 5 of the 6 planned course, we will follow up with Dr. Irene Limbo. We discussed the risks, benefits, short, and long term effects of radiotherapy, and the patient is interested in proceeding. Dr. Lisbeth Renshaw discusses the delivery and logistics of a radiotherapy regimen for gastric cancer, and would offer a course of 5 1/2 weeks of daily therapy. I will await follow up with Dr. Irene Limbo and inform the patient  of our time frame to move forward if indicated by his post treatment scans.   The above documentation reflects my direct findings during this shared patient visit. Please see the separate note by Dr. Lisbeth Renshaw on this date for the remainder of the patient's plan of care.    Carola Rhine, PAC  This document serves as a record of services personally performed by Kyung Rudd, MD and Shona Simpson, PA-C. It was created on their behalf by Linward Natal, a trained medical scribe. The creation of this record is based on the scribe's personal observations and the provider's statements to them. This document has been checked and approved by the attending provider.

## 2017-06-08 NOTE — Progress Notes (Signed)
Please see the Nurse Progress Note in the MD Initial Consult Encounter for this patient. 

## 2017-06-14 ENCOUNTER — Telehealth: Payer: Self-pay

## 2017-06-14 ENCOUNTER — Ambulatory Visit (HOSPITAL_BASED_OUTPATIENT_CLINIC_OR_DEPARTMENT_OTHER): Payer: Medicare Other

## 2017-06-14 ENCOUNTER — Ambulatory Visit (HOSPITAL_COMMUNITY): Payer: BLUE CROSS/BLUE SHIELD

## 2017-06-14 ENCOUNTER — Other Ambulatory Visit (HOSPITAL_BASED_OUTPATIENT_CLINIC_OR_DEPARTMENT_OTHER): Payer: BLUE CROSS/BLUE SHIELD

## 2017-06-14 VITALS — BP 120/61 | HR 68 | Temp 98.0°F | Resp 20

## 2017-06-14 DIAGNOSIS — C164 Malignant neoplasm of pylorus: Secondary | ICD-10-CM

## 2017-06-14 DIAGNOSIS — C163 Malignant neoplasm of pyloric antrum: Secondary | ICD-10-CM

## 2017-06-14 DIAGNOSIS — Z5111 Encounter for antineoplastic chemotherapy: Secondary | ICD-10-CM

## 2017-06-14 DIAGNOSIS — Z7189 Other specified counseling: Secondary | ICD-10-CM

## 2017-06-14 LAB — CBC WITH DIFFERENTIAL/PLATELET
BASO%: 1.1 % (ref 0.0–2.0)
Basophils Absolute: 0 10*3/uL (ref 0.0–0.1)
EOS%: 0.8 % (ref 0.0–7.0)
Eosinophils Absolute: 0 10*3/uL (ref 0.0–0.5)
HCT: 33.9 % — ABNORMAL LOW (ref 38.4–49.9)
HGB: 10.8 g/dL — ABNORMAL LOW (ref 13.0–17.1)
LYMPH%: 32.3 % (ref 14.0–49.0)
MCH: 25.2 pg — ABNORMAL LOW (ref 27.2–33.4)
MCHC: 31.9 g/dL — ABNORMAL LOW (ref 32.0–36.0)
MCV: 79.2 fL — ABNORMAL LOW (ref 79.3–98.0)
MONO#: 0.5 10*3/uL (ref 0.1–0.9)
MONO%: 12.3 % (ref 0.0–14.0)
NEUT#: 2 10*3/uL (ref 1.5–6.5)
NEUT%: 53.5 % (ref 39.0–75.0)
Platelets: 145 10*3/uL (ref 140–400)
RBC: 4.28 10*6/uL (ref 4.20–5.82)
RDW: 17.1 % — ABNORMAL HIGH (ref 11.0–14.6)
WBC: 3.8 10*3/uL — ABNORMAL LOW (ref 4.0–10.3)
lymph#: 1.2 10*3/uL (ref 0.9–3.3)
nRBC: 0 % (ref 0–0)

## 2017-06-14 LAB — COMPREHENSIVE METABOLIC PANEL
ALT: 13 U/L (ref 0–55)
AST: 19 U/L (ref 5–34)
Albumin: 3.4 g/dL — ABNORMAL LOW (ref 3.5–5.0)
Alkaline Phosphatase: 80 U/L (ref 40–150)
Anion Gap: 10 mEq/L (ref 3–11)
BUN: 9.5 mg/dL (ref 7.0–26.0)
CO2: 26 mEq/L (ref 22–29)
Calcium: 9.6 mg/dL (ref 8.4–10.4)
Chloride: 107 mEq/L (ref 98–109)
Creatinine: 0.8 mg/dL (ref 0.7–1.3)
EGFR: >90 mL/min/{1.73_m2} (ref >90)
Glucose: 107 mg/dl (ref 70–140)
Potassium: 3.6 mEq/L (ref 3.5–5.1)
Sodium: 143 mEq/L (ref 136–145)
Total Bilirubin: 0.4 mg/dL (ref 0.20–1.20)
Total Protein: 7.1 g/dL (ref 6.4–8.3)

## 2017-06-14 MED ORDER — LORAZEPAM 2 MG/ML IJ SOLN: 0.5000 mg | Freq: Once | INTRAMUSCULAR | Status: DC

## 2017-06-14 MED ORDER — DEXAMETHASONE SODIUM PHOSPHATE 10 MG/ML IJ SOLN
10.0000 mg | Freq: Once | INTRAMUSCULAR | Status: AC
  Administered 2017-06-14: 10 mg via INTRAVENOUS

## 2017-06-14 MED ORDER — DEXTROSE 5 % IV SOLN
Freq: Once | INTRAVENOUS | Status: AC
  Administered 2017-06-14: 12:00:00 via INTRAVENOUS

## 2017-06-14 MED ORDER — FLUOROURACIL CHEMO INJECTION 2.5 GM/50ML
400.0000 mg/m2 | Freq: Once | INTRAVENOUS | Status: AC
  Administered 2017-06-14: 950 mg via INTRAVENOUS
  Filled 2017-06-14: qty 19

## 2017-06-14 MED ORDER — PALONOSETRON HCL INJECTION 0.25 MG/5ML
0.2500 mg | Freq: Once | INTRAVENOUS | Status: AC
  Administered 2017-06-14: 0.25 mg via INTRAVENOUS

## 2017-06-14 MED ORDER — DEXAMETHASONE SODIUM PHOSPHATE 10 MG/ML IJ SOLN
INTRAMUSCULAR | Status: AC
  Filled 2017-06-14: qty 1

## 2017-06-14 MED ORDER — LEUCOVORIN CALCIUM INJECTION 350 MG
400.0000 mg/m2 | Freq: Once | INTRAVENOUS | Status: AC
  Administered 2017-06-14: 936 mg via INTRAVENOUS
  Filled 2017-06-14: qty 46.8

## 2017-06-14 MED ORDER — PALONOSETRON HCL INJECTION 0.25 MG/5ML
INTRAVENOUS | Status: AC
  Filled 2017-06-14: qty 5

## 2017-06-14 MED ORDER — OXALIPLATIN CHEMO INJECTION 100 MG/20ML
85.0000 mg/m2 | Freq: Once | INTRAVENOUS | Status: AC
  Administered 2017-06-14: 200 mg via INTRAVENOUS
  Filled 2017-06-14: qty 40

## 2017-06-14 MED ORDER — SODIUM CHLORIDE 0.9 % IV SOLN
2400.0000 mg/m2 | INTRAVENOUS | Status: DC
  Administered 2017-06-14: 5600 mg via INTRAVENOUS
  Filled 2017-06-14: qty 112

## 2017-06-14 NOTE — Patient Instructions (Signed)
[facility] Discharge Instructions for Patients Receiving Chemotherapy  Today you received the following chemotherapy agents Oxaliplatin, Lecovorin, Adrucil  To help prevent nausea and vomiting after your treatment, we encourage you to take your nausea medication    If you develop nausea and vomiting that is not controlled by your nausea medication, call the clinic.   BELOW ARE SYMPTOMS THAT SHOULD BE REPORTED IMMEDIATELY:  *FEVER GREATER THAN 100.5 F  *CHILLS WITH OR WITHOUT FEVER  NAUSEA AND VOMITING THAT IS NOT CONTROLLED WITH YOUR NAUSEA MEDICATION  *UNUSUAL SHORTNESS OF BREATH  *UNUSUAL BRUISING OR BLEEDING  TENDERNESS IN MOUTH AND THROAT WITH OR WITHOUT PRESENCE OF ULCERS  *URINARY PROBLEMS  *BOWEL PROBLEMS  UNUSUAL RASH Items with * indicate a potential emergency and should be followed up as soon as possible.  Feel free to call the clinic you have any questions or concerns. The clinic phone number is [phone].  Please show the [place] at check-in to the Emergency Department and triage nurse.

## 2017-06-14 NOTE — Telephone Encounter (Signed)
Call from infusion nurse taking care of pt today. CT scheduled for this afternoon. Pt will have pump and will be unable to have contrast at the same time. CT rescheduled for 8/8 at 1130. Infusion room RN notified of change to relay to pt.

## 2017-06-16 ENCOUNTER — Ambulatory Visit (HOSPITAL_BASED_OUTPATIENT_CLINIC_OR_DEPARTMENT_OTHER): Payer: Medicare Other

## 2017-06-16 VITALS — BP 120/67 | HR 78 | Temp 97.4°F | Resp 18

## 2017-06-16 DIAGNOSIS — Z452 Encounter for adjustment and management of vascular access device: Secondary | ICD-10-CM | POA: Diagnosis not present

## 2017-06-16 DIAGNOSIS — Z7189 Other specified counseling: Secondary | ICD-10-CM

## 2017-06-16 DIAGNOSIS — C164 Malignant neoplasm of pylorus: Secondary | ICD-10-CM | POA: Diagnosis present

## 2017-06-16 DIAGNOSIS — C163 Malignant neoplasm of pyloric antrum: Secondary | ICD-10-CM

## 2017-06-16 MED ORDER — SODIUM CHLORIDE 0.9% FLUSH
10.0000 mL | INTRAVENOUS | Status: DC | PRN
  Administered 2017-06-16: 10 mL
  Filled 2017-06-16: qty 10

## 2017-06-16 MED ORDER — HEPARIN SOD (PORK) LOCK FLUSH 100 UNIT/ML IV SOLN
500.0000 [IU] | Freq: Once | INTRAVENOUS | Status: AC | PRN
  Administered 2017-06-16: 500 [IU]
  Filled 2017-06-16: qty 5

## 2017-06-16 NOTE — Patient Instructions (Signed)
Implanted Port Insertion  Implanted port insertion is a procedure to put in a port and catheter. The port is a device with an injectable disk that can be accessed by your health care provider. The port is connected to a vein in the chest or neck by a small flexible tube (catheter). There are different types of ports. The implanted port may be used as a long-term IV access for:  · Medicines, such as chemotherapy.  · Fluids.  · Liquid nutrition, such as total parenteral nutrition (TPN).  · Blood samples.    Having a port means that your health care provider will not need to use the veins in your arms for these procedures.  Tell a health care provider about:  · Any allergies you have.  · All medicines you are taking, especially blood thinners, as well as any vitamins, herbs, eye drops, creams, over-the-counter medicines, and steroids.  · Any problems you or family members have had with anesthetic medicines.  · Any blood disorders you have.  · Any surgeries you have had.  · Any medical conditions you have, including diabetes or kidney problems.  · Whether you are pregnant or may be pregnant.  What are the risks?  Generally, this is a safe procedure. However, problems may occur, including:  · Allergic reactions to medicines or dyes.  · Damage to other structures or organs.  · Infection.  · Damage to the blood vessel, bruising, or bleeding at the puncture site.  · Blood clot.  · Breakdown of the skin over the port.  · A collection of air in the chest that can cause one of the lungs to collapse (pneumothorax). This is rare.    What happens before the procedure?  Staying hydrated  Follow instructions from your health care provider about hydration, which may include:  · Up to 2 hours before the procedure - you may continue to drink clear liquids, such as water, clear fruit juice, black coffee, and plain tea.    Eating and drinking restrictions  · Follow instructions from your health care provider about eating and drinking,  which may include:  ? 8 hours before the procedure - stop eating heavy meals or foods such as meat, fried foods, or fatty foods.  ? 6 hours before the procedure - stop eating light meals or foods, such as toast or cereal.  ? 6 hours before the procedure - stop drinking milk or drinks that contain milk.  ? 2 hours before the procedure - stop drinking clear liquids.  Medicines  · Ask your health care provider about:  ? Changing or stopping your regular medicines. This is especially important if you are taking diabetes medicines or blood thinners.  ? Taking medicines such as aspirin and ibuprofen. These medicines can thin your blood. Do not take these medicines before your procedure if your health care provider instructs you not to.  · You may be given antibiotic medicine to help prevent infection.  General instructions  · Plan to have someone take you home from the hospital or clinic.  · If you will be going home right after the procedure, plan to have someone with you for 24 hours.  · You may have blood tests.  · You may be asked to shower with a germ-killing soap.  What happens during the procedure?  · To lower your risk of infection:  ? Your health care team will wash or sanitize their hands.  ? Your skin will be washed with   soap.  ? Hair may be removed from the surgical area.  · An IV tube will be inserted into one of your veins.  · You will be given one or more of the following:  ? A medicine to help you relax (sedative).  ? A medicine to numb the area (local anesthetic).  · Two small cuts (incisions) will be made to insert the port.  ? One incision will be made in your neck to get access to the vein where the catheter will lie.  ? The other incision will be made in the upper chest. This is where the port will lie.  · The procedure may be done using continuous X-ray (fluoroscopy) or other imaging tools for guidance.  · The port and catheter will be placed. There may be a small, raised area where the port  is.  · The port will be flushed with a salt solution (saline), and blood will be drawn to make sure that it is working correctly.  · The incisions will be closed.  · Bandages (dressings) may be placed over the incisions.  The procedure may vary among health care providers and hospitals.  What happens after the procedure?  · Your blood pressure, heart rate, breathing rate, and blood oxygen level will be monitored until the medicines you were given have worn off.  · Do not drive for 24 hours if you were given a sedative.  · You will be given a manufacturer's information card for the type of port that you have. Keep this with you.  · Your port will need to be flushed and checked as told by your health care provider, usually every few weeks.  · A chest X-ray will be done to:  ? Check the placement of the port.  ? Make sure there is no injury to your lung.  Summary  · Implanted port insertion is a procedure to put in a port and catheter.  · The implanted port is used as a long-term IV access.  · The port will need to be flushed and checked as told by your health care provider, usually every few weeks.  · Keep your manufacturer's information card with you at all times.  This information is not intended to replace advice given to you by your health care provider. Make sure you discuss any questions you have with your health care provider.  Document Released: 08/21/2013 Document Revised: 09/21/2016 Document Reviewed: 09/21/2016  Elsevier Interactive Patient Education © 2017 [employer]

## 2017-06-21 ENCOUNTER — Ambulatory Visit (HOSPITAL_COMMUNITY)
Admission: RE | Admit: 2017-06-21 | Discharge: 2017-06-21 | Disposition: A | Discharge status: Home / Self Care | Payer: BLUE CROSS/BLUE SHIELD | Source: Ambulatory Visit | Attending: Hematology | Admitting: Hematology

## 2017-06-21 DIAGNOSIS — C169 Malignant neoplasm of stomach, unspecified: Secondary | ICD-10-CM | POA: Diagnosis present

## 2017-06-21 MED ORDER — IOPAMIDOL (ISOVUE-300) INJECTION 61%
INTRAVENOUS | Status: AC
  Filled 2017-06-21: qty 30

## 2017-06-21 MED ORDER — IOPAMIDOL (ISOVUE-300) INJECTION 61%
30.0000 mL | Freq: Once | INTRAVENOUS | Status: AC | PRN
  Administered 2017-06-21: 30 mL via INTRAVENOUS

## 2017-06-21 MED ORDER — IOPAMIDOL (ISOVUE-300) INJECTION 61%
INTRAVENOUS | Status: AC
  Filled 2017-06-21: qty 100

## 2017-06-21 MED ORDER — IOPAMIDOL (ISOVUE-300) INJECTION 61%
100.0000 mL | Freq: Once | INTRAVENOUS | Status: AC | PRN
  Administered 2017-06-21: 100 mL via INTRAVENOUS

## 2017-06-28 ENCOUNTER — Other Ambulatory Visit (HOSPITAL_BASED_OUTPATIENT_CLINIC_OR_DEPARTMENT_OTHER): Payer: BLUE CROSS/BLUE SHIELD

## 2017-06-28 ENCOUNTER — Ambulatory Visit (HOSPITAL_BASED_OUTPATIENT_CLINIC_OR_DEPARTMENT_OTHER): Payer: Medicare Other | Admitting: Hematology

## 2017-06-28 ENCOUNTER — Encounter: Payer: Self-pay | Admitting: Hematology

## 2017-06-28 VITALS — BP 134/62 | HR 65 | Temp 98.2°F | Resp 18 | Ht 71.0 in | Wt 251.0 lb

## 2017-06-28 DIAGNOSIS — C164 Malignant neoplasm of pylorus: Secondary | ICD-10-CM

## 2017-06-28 DIAGNOSIS — Z95828 Presence of other vascular implants and grafts: Secondary | ICD-10-CM

## 2017-06-28 DIAGNOSIS — C169 Malignant neoplasm of stomach, unspecified: Secondary | ICD-10-CM

## 2017-06-28 DIAGNOSIS — C163 Malignant neoplasm of pyloric antrum: Secondary | ICD-10-CM

## 2017-06-28 DIAGNOSIS — D630 Anemia in neoplastic disease: Secondary | ICD-10-CM | POA: Diagnosis not present

## 2017-06-28 DIAGNOSIS — E440 Moderate protein-calorie malnutrition: Secondary | ICD-10-CM | POA: Diagnosis not present

## 2017-06-28 LAB — COMPREHENSIVE METABOLIC PANEL
ALT: 18 U/L (ref 0–55)
AST: 27 U/L (ref 5–34)
Albumin: 3.2 g/dL — ABNORMAL LOW (ref 3.5–5.0)
Alkaline Phosphatase: 75 U/L (ref 40–150)
Anion Gap: 9 mEq/L (ref 3–11)
BUN: 10.2 mg/dL (ref 7.0–26.0)
CO2: 25 mEq/L (ref 22–29)
Calcium: 9.5 mg/dL (ref 8.4–10.4)
Chloride: 107 mEq/L (ref 98–109)
Creatinine: 0.8 mg/dL (ref 0.7–1.3)
EGFR: >90 mL/min/{1.73_m2} (ref >90)
Glucose: 148 mg/dl — ABNORMAL HIGH (ref 70–140)
Potassium: 3.7 mEq/L (ref 3.5–5.1)
Sodium: 141 mEq/L (ref 136–145)
Total Bilirubin: 0.6 mg/dL (ref 0.20–1.20)
Total Protein: 6.7 g/dL (ref 6.4–8.3)

## 2017-06-28 LAB — CBC WITH DIFFERENTIAL/PLATELET
BASO%: 1.2 % (ref 0.0–2.0)
Basophils Absolute: 0 10*3/uL (ref 0.0–0.1)
EOS%: 2 % (ref 0.0–7.0)
Eosinophils Absolute: 0.1 10*3/uL (ref 0.0–0.5)
HCT: 34.1 % — ABNORMAL LOW (ref 38.4–49.9)
HGB: 11.1 g/dL — ABNORMAL LOW (ref 13.0–17.1)
LYMPH%: 34.8 % (ref 14.0–49.0)
MCH: 25.3 pg — ABNORMAL LOW (ref 27.2–33.4)
MCHC: 32.5 g/dL (ref 32.0–36.0)
MCV: 78 fL — ABNORMAL LOW (ref 79.3–98.0)
MONO#: 0.4 10*3/uL (ref 0.1–0.9)
MONO%: 13.7 % (ref 0.0–14.0)
NEUT#: 1.2 10*3/uL — ABNORMAL LOW (ref 1.5–6.5)
NEUT%: 48.3 % (ref 39.0–75.0)
Platelets: 119 10*3/uL — ABNORMAL LOW (ref 140–400)
RBC: 4.38 10*6/uL (ref 4.20–5.82)
RDW: 18.4 % — ABNORMAL HIGH (ref 11.0–14.6)
WBC: 2.6 10*3/uL — ABNORMAL LOW (ref 4.0–10.3)
lymph#: 0.9 10*3/uL (ref 0.9–3.3)

## 2017-06-28 NOTE — Patient Instructions (Signed)
Thank you for choosing [facility] to provide your oncology and hematology care.  To afford each patient quality time with our providers, please arrive 30 minutes before your scheduled appointment time.  If you arrive late for your appointment, you may be asked to reschedule.  We strive to give you quality time with our providers, and arriving late affects you and other patients whose appointments are after yours.  If you are a no show for multiple scheduled visits, you may be dismissed from the clinic at the providers discretion.   Again, thank you for choosing [facility], our hope is that these requests will decrease the amount of time that you wait before being seen by our physicians.  ______________________________________________________________________ Should you have questions after your visit to the [facility], please contact our office at [phone] between the hours of 8:30 and 4:30 p.m.    Voicemails left after 4:30p.m will not be returned until the following business day.   For prescription refill requests, please have your pharmacy contact us directly.  Please also try to allow 48 hours for prescription requests.   Please contact the scheduling department for questions regarding scheduling.  For scheduling of procedures such as PET scans, CT scans, MRI, Ultrasound, etc please contact central scheduling at [phone].   Resources For Cancer Patients and Caregivers:  American Cancer Society:  800-227-2345  Can help patients locate various types of support and financial assistance Cancer Care: 1-800-813-HOPE (4673) Provides financial assistance, online support groups, medication/co-pay assistance.   [place] DSS:  336-641-3447 Where to apply for food stamps, Medicaid, and utility assistance Medicare Rights Center: 800-333-4114 Helps people with Medicare understand their rights and benefits, navigate the Medicare system, and secure the  quality healthcare they deserve SCAT: 336-333-6589 [place] [employer]'s shared-ride transportation service for eligible riders who have a disability that prevents them from riding the fixed route bus.   For additional information on assistance programs please contact our social worker:   Grier Hock/Abigail Elmore:  [phone] 

## 2017-06-29 ENCOUNTER — Telehealth: Payer: Self-pay | Admitting: Radiation Oncology

## 2017-07-02 MED ORDER — CAPECITABINE 500 MG PO TABS: 825.0000 mg/m2 | ORAL_TABLET | Freq: Two times a day (BID) | ORAL | 0 refills | Status: DC

## 2017-07-02 NOTE — Progress Notes (Signed)
HEMATOLOGY/ONCOLOGY CLINIC NOTE  Date of Service: .06/28/2017  Patient Care Team: [name], MD as PCP - General (Internal Medicine) [name] (Surgery)   CHIEF COMPLAINTS/PURPOSE OF CONSULTATION:   f/u for gastric Adenocarcinoma.  HISTORY OF PRESENTING ILLNESS:   [name] is a wonderful 66 y.o. male is here for continued evaluation and management of recently diagnosed pT4a, pN3b, pM1 Gastric Adenocarcinoma.  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 Patient had presented to primary care physician with dyspeptic symptoms, partial gastric outlet obstruction and weight loss of 60 pounds. Patient has had a distal gastrectomy, cholecystectomy with jejunal feeding tube placement by Dr. Barry Dienes on 01/10/2017 for gastric obstruction secondary to adenocarcinoma of the gastric pylorus. He was noted to have invaded into the pancreas and a bit of the anterior pancreatic head was resected en bloc with the tumor.  Pathology showed  " INVASIVE MODERATELY TO POORLY DIFFERENTIATED ADENOCARCINOMA, SPANNING 4 CM IN GREATEST DIMENSION. TUMOR INVADES THROUGH STOMACH WALL TO INVOLVE SEROSAL SURFACE.  LYMPH/VASCULAR INVASION IS IDENTIFIED.  MARGINS ARE NEGATIVE.- FIFTEEN OF THIRTY NINE LYMPH NODES POSITIVE FOR METASTATIC ADENOCARCINOMA (15/39). EXTRACAPSULAR EXTENSION IS IDENTIFIED. 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  Patient had a prolonged hospitalization post surgery  due to significant NG tube output. Needed epidural and PCA for pain control. It took a while for him to be able to tolerate progression of 2 feeding. He had an abdominal drain due to a small collection near his pancreatic head. Was treated with Augmentin for this. Patient was in the hospital from 01/10/2017 and  was eventually discharged on 01/27/2017.  Patient postponed several attempts at posthospitalization follow-up. He has been recovering at home and has been optimizing his tube feeding. Patient was recently followed up by Dr. Barry Dienes and has had a port placement on 02/23/2017 and preparation for possible chemotherapy.  He notes that his tube feeding is up to 40 mL per hour for 12 hours and has targeted is 16 ml per hour. He was recently admitted to [facility] in [place] for a C. difficile colitis that caused a severe diarrhea. he is currently on oral vancomycin has completed about 5 days of treatment and is also on probiotics.  INTERVAL HISTORY  Patient is here for f/u completion of his 6th cycle of FOLFOX. He notes that he is feeling well. Good po intake and appetite  And stable weight. Feeding tube site healing. Minimal serous discharge.  No significant diarrhea. Sleeping better.  CT chest/abd/pelvis -- showed no evidence of residual or metastatic gastric cancer. We discussed in details and patient is agreeable to proceeding with concurrent chemo-radiation with Xeloda.  MEDICAL HISTORY:  Past Medical History:  Diagnosis Date  . Anemia   . Cancer [facility])    Gastric Adenocarcinoma  . Gastric outlet obstruction 01/2017  . GERD (gastroesophageal reflux disease)   . Pre-diabetes    denies  . SVT (supraventricular tachycardia) ([place])    during Admission and Surgery- 01/2017    SURGICAL HISTORY: Past Surgical History:  Procedure Laterality Date  . CHOLECYSTECTOMY N/A 01/10/2017   Procedure: CHOLECYSTECTOMY;  Surgeon: Stark Klein, MD;  Location: [facility];  Service: General;  Laterality: N/A;  . COLONOSCOPY    . COLONOSCOPY  10/11/2011   Procedure: COLONOSCOPY;  Surgeon: Daneil Dolin, MD;  [facility];  Service: Endoscopy;  Laterality: N/A;  10:30 AM  . ESOPHAGOGASTRODUODENOSCOPY N/A 12/30/2016   Procedure: ESOPHAGOGASTRODUODENOSCOPY (EGD);  Surgeon: Carol Ada, MD;  Location: [facility];  Service: Endoscopy;  Laterality: N/A;  . GASTRECTOMY N/A 01/10/2017   Procedure: DISTAL GASTRECTOMY, JEJUNUM  FEEDING TUBE PLACEMENT;  Surgeon: Stark Klein, MD;  Location: [place];  Service: General;  Laterality: N/A;  . IR GENERIC HISTORICAL  01/24/2017   IR [place] DUODEN/JEJUNO TUBE PERCUT W/FLUORO 01/24/2017 Aletta Edouard, MD [facility]-INTERV RAD  . IR REPLC DUODEN/JEJUNO TUBE PERCUT W/FLUORO  03/26/2017  . PORTACATH PLACEMENT N/A 02/23/2017   Procedure: INSERTION PORT-A-CATH;  Surgeon: Stark Klein, MD;  Location: [facility];  Service: General;  Laterality: N/A;  . testicle removed  1982    SOCIAL HISTORY: Social History   Social History  . Marital status: Married    Spouse name: N/A  . Number of children: N/A  . Years of education: N/A   Occupational History  . Not on file.   Social History Main Topics  . Smoking status: Never Smoker  . Smokeless tobacco: Never Used  . Alcohol use No     Comment: occ beer or wine but none in last 6 months   . Drug use: No  . Sexual activity: Not on file   Other Topics Concern  . Not on file   Social History Narrative  . No narrative on file    FAMILY HISTORY: Family History  Problem Relation Age of Onset  . Prostate cancer Father   . Colon cancer Neg Hx     ALLERGIES:  is allergic to no known allergies.  MEDICATIONS:  Current Outpatient Prescriptions  Medication Sig Dispense Refill  . chlorproMAZINE (THORAZINE) 100 MG tablet Take 1 tablet (100 mg total) by mouth 4 (four) times daily as needed for hiccoughs. (Patient not taking: Reported on 06/08/2017) 20 tablet 0  . dexamethasone (DECADRON) 4 MG tablet Take 2 tablets (8 mg total) by mouth daily. Start the day after chemotherapy for 2 days. Take with food. (Patient not taking: Reported on 06/28/2017) 30 tablet 1  . famotidine (PEPCID) 20 MG tablet Take 20 mg by mouth daily.     . ferrous sulfate 325 (65 FE) MG tablet Take 325 mg by mouth daily with breakfast.    .  HYDROcodone-acetaminophen (NORCO/VICODIN) 5-325 MG tablet Take 1-2 tablets by mouth every 6 (six) hours as needed for moderate pain. (Patient not taking: Reported on 06/28/2017) 30 tablet 0  . lidocaine-prilocaine (EMLA) cream Apply to affected area once (Patient not taking: Reported on 06/28/2017) 30 g 3  . ondansetron (ZOFRAN) 8 MG tablet Take 1 tablet (8 mg total) by mouth 2 (two) times daily as needed for refractory nausea / vomiting. Start on day 3 after chemotherapy. (Patient not taking: Reported on 06/08/2017) 30 tablet 1  . prochlorperazine (COMPAZINE) 10 MG tablet Take 1 tablet (10 mg total) by mouth every 6 (six) hours as needed (Nausea or vomiting). (Patient not taking: Reported on 06/08/2017) 30 tablet 1  . traZODone (DESYREL) 50 MG tablet TAKE 1 TABLET (50 MG TOTAL) BY MOUTH AT BEDTIME AS NEEDED FOR SLEEP. (Patient not taking: Reported on 06/08/2017) 30 tablet 0   No current facility-administered medications for this visit.     REVIEW OF SYSTEMS:    10 Point review of Systems was done is negative except as noted above.  PHYSICAL EXAMINATION: ECOG PERFORMANCE STATUS: 2 - Symptomatic, <50% confined to bed  . Vitals:   06/28/17 1352  BP: 134/62  Pulse: 65  Resp: 18  Temp: 98.2 F (36.8 C)  SpO2: 100%   Filed Weights   06/28/17 1352  Weight: 251 lb (113.9 kg)   .Body mass index is 35.01 kg/m.  GENERAL:alert, in no acute distress and comfortable SKIN: no acute rashes, no significant lesions EYES: conjunctiva are pink and non-injected, sclera anicteric OROPHARYNX: MMM, no exudates, no oropharyngeal erythema or ulceration NECK: supple, no JVD LYMPH:  no palpable lymphadenopathy in the cervical, axillary or inguinal regions LUNGS: clear to auscultation b/l with normal respiratory effort HEART: regular rate & rhythm ABDOMEN:  normoactive bowel sounds , non tender, not distended. Feeding tube site healing. Extremity: no pedal edema PSYCH: alert & oriented x 3 with fluent  speech NEURO: no focal motor/sensory deficits  LABORATORY DATA:  I have reviewed the data as listed  . CBC Latest Ref Rng & Units 06/28/2017 06/14/2017 05/31/2017  WBC 4.0 - 10.3 10e3/uL 2.6(L) 3.8(L) 4.4  Hemoglobin 13.0 - 17.1 g/dL 11.1(L) 10.8(L) 10.9(L)  Hematocrit 38.4 - 49.9 % 34.1(L) 33.9(L) 34.8(L)  Platelets 140 - 400 10e3/uL 119(L) 145 209  ANC 1.2k  . CMP Latest Ref Rng & Units 06/28/2017 06/14/2017 05/31/2017  Glucose 70 - 140 mg/dl 148(H) 107 131  BUN 7.0 - 26.0 mg/dL 10.2 9.5 8.5  Creatinine 0.7 - 1.3 mg/dL 0.8 0.8 0.9  Sodium 136 - 145 mEq/L 141 143 141  Potassium 3.5 - 5.1 mEq/L 3.7 3.6 3.5  Chloride 101 - 111 mmol/L - - -  CO2 22 - 29 mEq/L 25 26 23   Calcium 8.4 - 10.4 mg/dL 9.5 9.6 9.6  Total Protein 6.4 - 8.3 g/dL 6.7 7.1 7.2  Total Bilirubin 0.20 - 1.20 mg/dL 0.60 0.40 0.55  Alkaline Phos 40 - 150 U/L 75 80 83  AST 5 - 34 U/L 27 19 19   ALT 0 - 55 U/L 18 13 15       RADIOGRAPHIC STUDIES: I have personally reviewed the radiological images as listed and agreed with the findings in the report. Ct Chest W Contrast  Result Date: 06/21/2017 CLINICAL DATA:  Followup gastric adenocarcinoma. Followup postop fluid collection. EXAM: CT CHEST, ABDOMEN, AND PELVIS WITH CONTRAST TECHNIQUE: Multidetector CT imaging of the chest, abdomen and pelvis was performed following the standard protocol during bolus administration of intravenous contrast. CONTRAST:  154mL ISOVUE-300 IOPAMIDOL (ISOVUE-300) INJECTION 61% COMPARISON:  03/21/2017 FINDINGS: CT CHEST FINDINGS Cardiovascular: No acute findings. Mediastinum/Lymph Nodes: No masses or pathologically enlarged lymph nodes identified. Lungs/Pleura: No pulmonary infiltrate or mass identified. No effusion present. Musculoskeletal:  No suspicious bone lesions identified. CT ABDOMEN AND PELVIS FINDINGS Hepatobiliary: No masses identified. Prior cholecystectomy. No evidence of biliary dilatation. Pancreas: No mass or ductal dilatation. Previously  seen fluid collection adjacent pancreatic head has resolved. Near complete resolution of adjacent inflammatory changes. Spleen:  Within normal limits in size and appearance. Adrenals/Urinary tract:  No masses or hydronephrosis. Stomach/Bowel: Small hiatal hernia. Jejunostomy tube is been removed since previous study. No evidence of obstruction, inflammatory process, or abnormal fluid collections. Vascular/Lymphatic: No pathologically enlarged lymph nodes identified. No abdominal aortic aneurysm. Reproductive:  No mass or other significant abnormality identified. Other: Stable small left inguinal hernia with associated varicocele. Musculoskeletal:  No suspicious bone lesions identified. IMPRESSION: Resolution of peripancreatic fluid collection since previous study, and near complete resolution of other postop changes in right upper quadrant. No evidence of residual or metastatic carcinoma within the chest, abdomen, or pelvis. Electronically Signed   By: Earle Gell M.D.   On:  06/21/2017 15:13   Ct Abdomen Pelvis W Contrast  Result Date: 06/21/2017 CLINICAL DATA:  Followup gastric adenocarcinoma. Followup postop fluid collection. EXAM: CT CHEST, ABDOMEN, AND PELVIS WITH CONTRAST TECHNIQUE: Multidetector CT imaging of the chest, abdomen and pelvis was performed following the standard protocol during bolus administration of intravenous contrast. CONTRAST:  186mL ISOVUE-300 IOPAMIDOL (ISOVUE-300) INJECTION 61% COMPARISON:  03/21/2017 FINDINGS: CT CHEST FINDINGS Cardiovascular: No acute findings. Mediastinum/Lymph Nodes: No masses or pathologically enlarged lymph nodes identified. Lungs/Pleura: No pulmonary infiltrate or mass identified. No effusion present. Musculoskeletal:  No suspicious bone lesions identified. CT ABDOMEN AND PELVIS FINDINGS Hepatobiliary: No masses identified. Prior cholecystectomy. No evidence of biliary dilatation. Pancreas: No mass or ductal dilatation. Previously seen fluid collection adjacent  pancreatic head has resolved. Near complete resolution of adjacent inflammatory changes. Spleen:  Within normal limits in size and appearance. Adrenals/Urinary tract:  No masses or hydronephrosis. Stomach/Bowel: Small hiatal hernia. Jejunostomy tube is been removed since previous study. No evidence of obstruction, inflammatory process, or abnormal fluid collections. Vascular/Lymphatic: No pathologically enlarged lymph nodes identified. No abdominal aortic aneurysm. Reproductive:  No mass or other significant abnormality identified. Other: Stable small left inguinal hernia with associated varicocele. Musculoskeletal:  No suspicious bone lesions identified. IMPRESSION: Resolution of peripancreatic fluid collection since previous study, and near complete resolution of other postop changes in right upper quadrant. No evidence of residual or metastatic carcinoma within the chest, abdomen, or pelvis. Electronically Signed   By: Earle Gell M.D.   On: 06/21/2017 15:13    ASSESSMENT & PLAN:   66 year old African-American male with  1) Resected Stage IV pT4a, pN3b, pM1 invasive moderately-poorly Gastric Adenocarcinoma . Her 2 neg by FISH LVI + margins neg 15/39 LN +ve, Extracapsular invasion +, some involvement of pancreatic capsule there pM1 Presented with severe pyloric stenosis with ulceration. 60 pound weight loss. S/p distal gastrectomy, cholecystectomy and en bloc dissection off the small part of the pancreatic head  with jejunal feeding tube placement. Initial CT abdomen and pelvis did not show any local regional lymphadenopathy. Rpt CT chest/abd/pelvis from 03/22/2017 - no overt evidence of residual disease or metastatic disease. Post-surgical peripancreatic fluid collection resolving. CT chest/abd/pelvis 06/21/2017 after 6 cycles of FOLFOX -  No evidence of residual or metastatic carcinoma within the chest, abdomen, or pelvis  #2 s/p moderate protein calorie malnutrition patient had lost about 60  pounds in the last 2-3 months but has been gaining back his lost weight well. . 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  #4 status post port placement on 02/23/2017   #5 recent Clostridium difficile colitis -. No overt issues with diarrhea currently. Has completed his prolonged po vancomycin course. Plan  -patient has completed his 6 planned cycles of FOLFOX and tolerated them well without any prohibitive toxicities. CT chest/abd/pelvis was done on 06/21/2017 and showed  no overt evidence of recurrent or progressive metastatic disease. -discussed with patient concurrent chemo-radiation with PO Xeloda and he is agreeable to this. He has already seen Dr Lisbeth Renshaw previously and will f/u with Rad Onc to determine timing of starting treatment. -we shall work on getting him the Xeloda 825mg /m2 twice daily Monday through Friday while on radiation.  #4 suspected sleep apnea.Has not had a sleep study.Wife notes significant snoring which is  important with weight loss. Will have to be careful with excessive sedation. -continue prn low dose trazodone for insomnia -recommended he f/u with his PCP for consideration of sleep study-pending  RTC with Dr Irene Limbo in 3 weeks with labs Will work on getting patient Xeloda F/u with Radiation oncology Dr Lisbeth Renshaw to plan start of chemoradaition.   All of the patients questions were answered with apparent satisfaction. The patient knows to call the clinic with any problems, questions or concerns.  I spent 20 minutes counseling the patient face to face. The total time spent in the appointment was 30 minutes and more than 50% was on counseling and direct patient cares.   All of the patients questions were answered with apparent satisfaction. The patient knows to call the  clinic with any problems, questions or concerns.   Steven Lone MD [place] AAHIVMS [facility] Hematology/Oncology Physician [facility]  (Office):       [phone] (Work cell):  [phone] (Fax):           [phone]

## 2017-07-03 ENCOUNTER — Telehealth: Payer: Self-pay | Admitting: Pharmacy Technician

## 2017-07-03 ENCOUNTER — Other Ambulatory Visit: Payer: Self-pay | Admitting: *Deleted

## 2017-07-03 MED ORDER — CAPECITABINE 500 MG PO TABS: 825.0000 mg/m2 | ORAL_TABLET | Freq: Two times a day (BID) | ORAL | 0 refills | Status: DC

## 2017-07-03 NOTE — Telephone Encounter (Signed)
Oral Oncology Patient Advocate Encounter  Prior Authorization for Xeloda has been submitted and approved.    PA# [id number] Effective dates: 07/03/2017 through 07/03/2018.  A test claim on 07/03/2017 reveals a $0 copayment.   Oral Oncology Clinic will continue to follow.   Fabio Asa. Melynda Keller, [place] [facility] [place] [phone] 07/03/2017 2:03 PM

## 2017-07-03 NOTE — Telephone Encounter (Signed)
Opened in error

## 2017-07-06 ENCOUNTER — Encounter: Payer: Self-pay | Admitting: Radiation Oncology

## 2017-07-07 ENCOUNTER — Telehealth: Payer: Self-pay | Admitting: Pharmacist

## 2017-07-07 DIAGNOSIS — C160 Malignant neoplasm of cardia: Secondary | ICD-10-CM

## 2017-07-07 MED ORDER — CAPECITABINE 500 MG PO TABS: 825.0000 mg/m2 | ORAL_TABLET | Freq: Two times a day (BID) | ORAL | 0 refills | Status: DC

## 2017-07-07 NOTE — Telephone Encounter (Signed)
Oral Oncology Pharmacist Encounter  Received new prescription for Xeloda for the treatment of metastatic gastric cancer in conjunction with radiation, planned duration 5-6 weeks based on ration schedule.  Labs from 06/28/17 assessed, OK for treatment.  Current medication list in Epic reviewed, DDIs with Xeloda identified:  Xeloda and chlorpromazine, Zofran, and trazodone all due to each agents change of QTc prolongation. EKGs in Epic assessed, QTcs ~472msec. Consider repeat EKG in electrolyte abnormalities.  Prescription has been e-scribed to [facility] in [place], [place] for benefits analysis and approval per insurance requirement.  Oral Oncology Clinic will continue to follow for insurance authorization, copayment issues, initial counseling and start date.  Johny Drilling, PharmD, BCPS, BCOP 07/07/2017 4:40 PM Oral Oncology Clinic [phone]

## 2017-07-10 ENCOUNTER — Telehealth: Payer: Self-pay

## 2017-07-10 NOTE — Telephone Encounter (Signed)
Message to coordinate xeloda prescription with radiation week of 8/20-8/24 concurrent with radiation. To reach out to [place] in pharmacy if needed. Xeloda prescription noted in Med list and note from [place] last week.

## 2017-07-10 NOTE — Progress Notes (Signed)
DIAGNOSIS: Stage IV pT4a, pN3b, pM1 invasive moderately-poorly Gastric Adenocarcinoma . Her 2 neg by FISH  HISTORY OF PRESENT ILLNESS: [name] is a 66 y.o. male seen at the request of Dr. Irene Limbo for a history of metastatic gastric cancer. Beginning in November 2017, the patient experienced progressive dyspeptic symptoms, acid reflux, and abdominal fullness after meals. He was initially treated with Zantac without any relief and eventually was treated for Helicobacter pylori infection by PCP. He lost 60 lbs overa few month's time and  underwent chest/abdomen/pelvis CT on 12/26/16. This showed prominent distention of the stomach with food debris. Mild annular wall thickening in the gastric antrum was noted. No  Significant perigastric lymphadenopathy was seen. An EGD on 12/30/16 which showed malignant appearing intrinsic severe stenosis at the gastric pylorus. This was transversed with a pediatric endoscope. Stenosis was noted to be severe.   GI Location of Tumor / Histology: Gastric Pylorus [name] presented with symptoms of: progressive dyspeptic symptoms, acid reflux, and abdominal fullness after meals.  Biopsies of  (if applicable) revealed: 3/33/5456  1. FLUORESCENCE IN-SITU HYBRIDIZATION Results: HER2 - NEGATIVE RATIO OF HER2/CEP17 SIGNALS 1.26 AVERAGE HER2 COPY NUMBER PER CELL 2.45 Reference Range: NEGATIVE HER2/CEP17 Ratio <2.0 and average HER2 copy number <4.0 EQUIVOCAL HER2/CEP17 Ratio <2.0 and average HER2 copy number >=4.0 and <6.0 POSITIVE HER2/CEP17 Ratio >=2.0 or <2.0 and average HER2 copy number >=6.0 Diagnosis 1. Stomach, resection for tumor, distal/antrum - INVASIVE MODERATELY TO POORLY DIFFERENTIATED ADENOCARCINOMA, SPANNING 4 CM IN GREATEST DIMENSION. - TUMOR INVADES THROUGH STOMACH WALL TO INVOLVE SEROSAL SURFACE. - LYMPH/VASCULAR INVASION IS IDENTIFIED. - MARGINS ARE NEGATIVE. - FIFTEEN OF THIRTY NINE LYMPH NODES POSITIVE FOR METASTATIC ADENOCARCINOMA  (15/39). - EXTRACAPSULAR EXTENSION IS IDENTIFIED. 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 - SEE ONCOLOGY TEMPLATE. 2. Soft tissue, biopsy, Lesser,Sac nodule 1 of 5 Supplemental copy SUPPLEMENTAL for [name], [name] ([id number]) Diagnosis(continued) - ONE LYMPH NODE POSITIVE FOR METASTATIC ADENOCARCINOMA (1/1). - AN ADDITIONAL BENIGN LYMPH NODE WITH NO TUMOR SEEN (0/1). 3. Gallbladder - MILD CHRONIC CHOLECYSTITIS. - CHOLELITHIASIS. - NO TUMOR SEEN  Past/Anticipated interventions by surgeon, if an  12/30/16  ESOPHAGOGASTRODUODENOSCOPY (EGD  01/10/2017  Cholecystectomy  Dr Barry Dienes   01/24/2017  IR [place] W/FLUORO 01/24/2017 Aletta Edouard, MD [facility]-INTERV RAD   02/23/2017  Port-a-cath by. Dr. Barry Dienes  Past/Anticipated interventions by medical oncology, if any:   Patient is here for f/u completion of his 6th cycle of FOLFOX. He notes that he is feeling well. Good po intake and appetite  And stable weight. Feeding tube site healing. Minimal      Weight changes, if any:   initially lost 60 lbs over 2-3 months, after Jejuno tube placed and patient was able to tolerate feeds he started gaining some weight back.  Recently has started taking food po and stopped using Jejuno.  Was recommended by Dr. Irene Limbo that patient keep Jejuno tube just in case if additional GI symptoms occur.     Bowel/Bladder complaints, if any: Denies any issues  Nausea / Vomiting, if any:  None  Pain issues, if any:  None  Any blood per rectum:   None  SAFETY ISSUES:  Prior radiation?  None  Pacemaker/ICD?  None  Possible current pregnancy? No  Is the patient on methotrexate?None  Current Complaints/Details: Denies any issue today.  Vitals:   07/11/17 1302  BP: 127/72  Resp: 20  Temp: 98.1 F (36.7 C)  TempSrc: Oral  SpO2: 100%  Weight: 255 lb (115.7 kg)    Wt Readings from Last 3  Encounters:  07/11/17 255 lb (115.7 kg)  06/28/17 251 lb (113.9 kg)  06/08/17 241 lb 9.6 oz (109.6 kg)

## 2017-07-10 NOTE — Telephone Encounter (Signed)
Addendum to other 07/10/17 telephone note. Pt not yet scheduled for radiation. Xeloda in process per Denyse Amass, pharmacy.

## 2017-07-11 ENCOUNTER — Ambulatory Visit
Admission: RE | Admit: 2017-07-11 | Discharge: 2017-07-11 | Disposition: A | Discharge status: Home / Self Care | Payer: BLUE CROSS/BLUE SHIELD | Source: Ambulatory Visit | Attending: Radiation Oncology | Admitting: Radiation Oncology

## 2017-07-11 ENCOUNTER — Encounter: Payer: Self-pay | Admitting: Radiation Oncology

## 2017-07-11 VITALS — BP 127/72 | Temp 98.1°F | Resp 20 | Wt 255.0 lb

## 2017-07-11 DIAGNOSIS — Z510 Encounter for antineoplastic radiation therapy: Secondary | ICD-10-CM | POA: Diagnosis not present

## 2017-07-11 DIAGNOSIS — C160 Malignant neoplasm of cardia: Secondary | ICD-10-CM

## 2017-07-11 NOTE — Addendum Note (Signed)
Encounter addended by: Hayden Pedro, PA-C on: 07/11/2017  1:42 PM<BR>    Actions taken: Sign clinical note

## 2017-07-11 NOTE — Progress Notes (Signed)
Radiation Oncology         [phone] ________________________________  Name: [name] MRN: [id number]  Date: 06/08/2017  DOB: 1951/02/08  [id number], Steven Beard, [place]  Steven Genera, [place]     REFERRING PHYSICIAN: Brunetta Genera, [place]   DIAGNOSIS: Stage IV pT4a, pN3b, pM1 invasive moderately-poorly Gastric Adenocarcinoma . Her 2 neg by FISH  HISTORY OF PRESENT ILLNESS: [name] is a 66 y.o. male seen at the request of [name] for a history of metastatic gastric cancer. Beginning in November 2017, the patient experienced progressive dyspeptic symptoms, acid reflux, and abdominal fullness after meals. He was initially treated with Zantac without any relief and eventually was treated for Helicobacter pylori infection by PCP. He lost 60 lbs overa few month's time and  underwent chest/abdomen/pelvis CT on 12/26/16. This showed prominent distention of the stomach with food debris. Mild annular wall thickening in the gastric antrum was noted. No  Significant perigastric lymphadenopathy was seen. An EGD on 12/30/16 which showed malignant appearing intrinsic severe stenosis at the gastric pylorus. This was transversed with a pediatric endoscope. Stenosis was noted to be severe.   Biopsy was taken of the ulceration at the pylorus and findings were consistent with adenocarcinoma of the stomach along with marked acute and chronic inflammation. Warthin Starry stain was negative for Helicobacter pylori. On 01/10/17, [name] performed a distal gastrectomy, cholecystectomy with jejunal feeding tube placement. It was noted to have invaded into the pancreas and a portion of the anterior head was resected en bloc with the tumor.  Pathology revealed INVASIVE MODERATELY TO POORLY DIFFERENTIATED ADENOCARCINOMA, SPANNING 4 CM IN GREATEST DIMENSION. TUMOR INVADES THROUGH STOMACH WALL TO INVOLVE SEROSAL SURFACE. LYMPH/VASCULAR INVASION IS IDENTIFIED. MARGINS ARE NEGATIVE.- FIFTEEN OF THIRTY NINE  LYMPH NODES POSITIVE FOR METASTATIC ADENOCARCINOMA (15/39). EXTRACAPSULAR EXTENSION IS IDENTIFIED. - SMALL FRAGMENT OF PANCREATIC TISSUE PRESENT WITH ADJACENT METASTATIC ADENOCARCINOMA INVOLVING THE PANCREATIC CAPSULE  The patient has since undergone chemotherapy treatment with FOLFOX x  6 cycles under the supervision of [name]. He met with [name] about 4 weeks ago prior to his last cycle of treatment. He has completed this without disruption in his care plan. He had a CT scan of the chest abdomen and pelvis on 06/21/2017 which revealed resolution of the peripancreatic fluid collection, and no evidence of progressive or metastatic disease. He comes to review the options for beginning radiotherapy.  PREVIOUS RADIATION THERAPY: No   PAST MEDICAL HISTORY:  Past Medical History:  Diagnosis Date  . Anemia   . Cancer [facility])    Gastric Adenocarcinoma  . Gastric outlet obstruction 01/2017  . GERD (gastroesophageal reflux disease)   . Pre-diabetes    denies  . SVT (supraventricular tachycardia) ([place])    during Admission and Surgery- 01/2017       PAST SURGICAL HISTORY: Past Surgical History:  Procedure Laterality Date  . CHOLECYSTECTOMY N/A 01/10/2017   Procedure: CHOLECYSTECTOMY;  Surgeon: [name];  Location: [place];  Service: General;  Laterality: N/A;  . COLONOSCOPY    . COLONOSCOPY  10/11/2011   Procedure: COLONOSCOPY;  Surgeon: Steven Dolin, [place];  [facility];  Service: Endoscopy;  Laterality: N/A;  10:30 AM  . ESOPHAGOGASTRODUODENOSCOPY N/A 12/30/2016   Procedure: ESOPHAGOGASTRODUODENOSCOPY (EGD);  Surgeon: Steven Ada, Strong;  Location: [facility];  Service: Endoscopy;  Laterality: N/A;  . GASTRECTOMY N/A 01/10/2017   Procedure: DISTAL GASTRECTOMY, JEJUNUM  FEEDING TUBE PLACEMENT;  Surgeon: [name];  Location: [place];  Service:  General;  Laterality: N/A;  . IR GENERIC HISTORICAL  01/24/2017   IR [place] DUODEN/JEJUNO TUBE PERCUT W/FLUORO 01/24/2017 Steven Edouard,  Strong [facility]-INTERV RAD  . IR REPLC DUODEN/JEJUNO TUBE PERCUT W/FLUORO  03/26/2017  . PORTACATH PLACEMENT N/A 02/23/2017   Procedure: INSERTION PORT-A-CATH;  Surgeon: [name];  Location: [place];  Service: General;  Laterality: N/A;  . testicle removed  1982     FAMILY HISTORY:  Family History  Problem Relation Age of Onset  . Prostate cancer Father   . Colon cancer Neg Hx      SOCIAL HISTORY:  reports that he has never smoked. He has never used smokeless tobacco. He reports that he does not drink alcohol or use drugs. 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 His wife lives in [place] full time.   ALLERGIES: No known allergies   MEDICATIONS:  Current Outpatient Prescriptions  Medication Sig Dispense Refill  . capecitabine (XELODA) 500 MG tablet Take 4 tablets (2,000 mg total) by mouth 2 (two) times daily after a meal. (monday through Friday each week) while on radiation. 224 tablet 0  . chlorproMAZINE (THORAZINE) 100 MG tablet Take 1 tablet (100 mg total) by mouth 4 (four) times daily as needed for hiccoughs. (Patient not taking: Reported on 06/08/2017) 20 tablet 0  . dexamethasone (DECADRON) 4 MG tablet Take 2 tablets (8 mg total) by mouth daily. Start the day after chemotherapy for 2 days. Take with food. (Patient not taking: Reported on 06/28/2017) 30 tablet 1  . famotidine (PEPCID) 20 MG tablet Take 20 mg by mouth daily.     . ferrous sulfate 325 (65 FE) MG tablet Take 325 mg by mouth daily with breakfast.    . HYDROcodone-acetaminophen (NORCO/VICODIN) 5-325 MG tablet Take 1-2 tablets by mouth every 6 (six) hours as needed for moderate pain. (Patient not taking: Reported on 06/28/2017) 30 tablet 0  . lidocaine-prilocaine (EMLA) cream Apply to affected area once (Patient not taking: Reported on 06/28/2017) 30 g 3  . ondansetron (ZOFRAN) 8 MG tablet Take 1  tablet (8 mg total) by mouth 2 (two) times daily as needed for refractory nausea / vomiting. Start on day 3 after chemotherapy. (Patient not taking: Reported on 06/08/2017) 30 tablet 1  . prochlorperazine (COMPAZINE) 10 MG tablet Take 1 tablet (10 mg total) by mouth every 6 (six) hours as needed (Nausea or vomiting). (Patient not taking: Reported on 06/08/2017) 30 tablet 1  . traZODone (DESYREL) 50 MG tablet TAKE 1 TABLET (50 MG TOTAL) BY MOUTH AT BEDTIME AS NEEDED FOR SLEEP. (Patient not taking: Reported on 06/08/2017) 30 tablet 0   No current facility-administered medications for this encounter.        REVIEW OF SYSTEMS: On review of systems, the patient reports that he is doing well overall. He denies any chest pain, shortness of breath, cough, fevers, chills, night sweats, unintended weight changes. He denies any bowel or bladder disturbances, and denies abdominal pain, nausea or vomiting. He denies any new musculoskeletal or joint aches or pains, new skin lesions or concerns. A complete review of systems is obtained and is otherwise negative.     PHYSICAL EXAM:  Wt Readings from Last 3 Encounters:  06/28/17 251 lb (113.9 kg)  06/08/17 241 lb 9.6 oz (109.6 kg)  05/31/17 248 lb 9.6 oz (112.8 kg)   Temp Readings from Last 3  Encounters:  06/28/17 98.2 F (36.8 C) (Oral)  06/16/17 (!) 97.4 F (36.3 C) (Oral)  06/14/17 98 F (36.7 C) (Oral)   BP Readings from Last 3 Encounters:  06/28/17 134/62  06/16/17 120/67  06/14/17 120/61   Pulse Readings from Last 3 Encounters:  06/28/17 65  06/16/17 78  06/14/17 68    In general this is a well appearing African American male in no acute distress. He's alert and oriented x4 and appropriate throughout the examination. Cardiopulmonary assessment is negative for acute distress and he exhibits normal effort.    ECOG = 1  0 - Asymptomatic (Fully active, able to carry on all predisease activities without restriction)  1 - Symptomatic but  completely ambulatory (Restricted in physically strenuous activity but ambulatory and able to carry out work of a light or sedentary nature. For example, light housework, office work)  2 - Symptomatic, <50% in bed during the day (Ambulatory and capable of all self care but unable to carry out any work activities. Up and about more than 50% of waking hours)  3 - Symptomatic, >50% in bed, but not bedbound (Capable of only limited self-care, confined to bed or chair 50% or more of waking hours)  4 - Bedbound (Completely disabled. Cannot carry on any self-care. Totally confined to bed or chair)  5 - Death   Eustace Pen MM, Creech RH, Tormey DC, et al. [phone]). "Toxicity and response criteria of the [facility]". [place] Oncol. 5 (6): 649-55    LABORATORY DATA:  Lab Results  Component Value Date   WBC 4.4 05/31/2017   HGB 10.9 (L) 05/31/2017   HCT 34.8 (L) 05/31/2017   MCV 79.5 05/31/2017   PLT 209 05/31/2017   Lab Results  Component Value Date   NA 141 05/31/2017   K 3.5 05/31/2017   CL 101 03/26/2017   CO2 23 05/31/2017   Lab Results  Component Value Date   ALT 15 05/31/2017   AST 19 05/31/2017   ALKPHOS 83 05/31/2017   BILITOT 0.55 05/31/2017      RADIOGRAPHY: No results found.     IMPRESSION/PLAN: 1. Stage IV pT4a, pN3b, pM1 invasive moderately-poorly Gastric Adenocarcinoma. The patient is ready to move forward with radiotherapy, and today we reviewed the risks, benefits, short, and long term effects of radiotherapy, and the patient is interested in proceeding. Dr. Lisbeth Renshaw discusses the delivery and logistics of a radiotherapy regimen for gastric cancer, again recommends a course of 5 1/2 weeks of radiotherapy with concurrent Xeloda. He's not received his medication yet. Written consent is obtained and placed in the chart, a copy was provided to the patient. The patient will simulate today.   In a visit lasting 25 minutes, greater than 50% of the time was  spent face to face discussing the logistics of treatment, and coordinating the patient's care.   The above documentation reflects my direct findings during this shared patient visit. Please see the separate note by Dr. Lisbeth Renshaw on this date for the remainder of the patient's plan of care.    Carola Rhine, PAC

## 2017-07-13 DIAGNOSIS — Z510 Encounter for antineoplastic radiation therapy: Secondary | ICD-10-CM | POA: Diagnosis not present

## 2017-07-13 MED ORDER — CAPECITABINE 500 MG PO TABS: 825.0000 mg/m2 | ORAL_TABLET | Freq: Two times a day (BID) | ORAL | 0 refills | Status: AC

## 2017-07-13 MED FILL — XELODA 500 MG TABLET: 500 | 39 days supply | Qty: 224 | Fill #0

## 2017-07-13 NOTE — Telephone Encounter (Signed)
Oral Chemotherapy Pharmacist Encounter  I called [facility] yesterday (07/12/17) to follow-up on Xeloda prescription status now that radiation schedule has been set.  Per pharmacist, patient's commercial insurance has lapsed as of 06/26/17 and the prescription no longer has to filled at a [facility]. Patient also is waiting for Medicare Part B benefits to start, which will happen on 07/15/17. Patient also has a Psychologist, occupational that will be retro-active, but in not currently in effect.  I cancelled prescription at outside pharmacy since patient is essentially uninsured this week, but needs Xeloda in his hand for radiation start on 07/18/17.  New prescription e-scribed to the [facility] outpatient pharmacy. Cash price for entire treatment course $50, patient states he can afford this and will pick-up his prescription today (07/13/17).   I spoke with patient for overview of new oral chemotherapy medication: Xeloda for the treatment of metastatic gastric cancer in conjunction with radiation, planned duration 5 1/2 - 6 weeks.   Counseled patient on administration, dosing, side effects, safe handling, and monitoring. Patient will take Xeloda 500mg  tablets, 4 tablets (2000mg ) by mouth in AM and 4 tabs (2000mg ) by mouth in PM, within 30 minutes of finishing meals, on days of radiation only. Xeloda and radiation start date: 07/18/17  Side effects include but not limited to: fatigue, decerased blood counts, GI upset, diarrhea, and hand-foot syndrome. Patient has loperamide at home and will call the office if diarrhea develops.    Reviewed with patient importance of keeping a medication schedule and plan for any missed doses.  Mr. Blankenhorn voiced understanding and appreciation.   All questions answered.  Patient knows to call the office with questions or concerns. Oral Oncology Clinic will continue to follow.  Thank you,  Johny Drilling, PharmD, BCPS, BCOP 07/13/2017   10:35 AM Oral Oncology Clinic [phone]

## 2017-07-18 ENCOUNTER — Ambulatory Visit
Admission: RE | Admit: 2017-07-18 | Discharge: 2017-07-18 | Disposition: A | Discharge status: Home / Self Care | Payer: BLUE CROSS/BLUE SHIELD | Source: Ambulatory Visit | Attending: Radiation Oncology | Admitting: Radiation Oncology

## 2017-07-18 DIAGNOSIS — Z510 Encounter for antineoplastic radiation therapy: Secondary | ICD-10-CM | POA: Diagnosis not present

## 2017-07-18 NOTE — Progress Notes (Signed)
HEMATOLOGY/ONCOLOGY CLINIC NOTE  Date of Service: .06/28/2017  Patient Care Team: [name], MD as PCP - General (Internal Medicine) [name] (Surgery)    CHIEF COMPLAINTS/PURPOSE OF CONSULTATION:   f/u for gastric Adenocarcinoma.  HISTORY OF PRESENTING ILLNESS:   [name] is a wonderful 66 y.o. male is here for continued evaluation and management of recently diagnosed pT4a, pN3b, pM1 Gastric Adenocarcinoma.  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 Patient had presented to primary care physician with dyspeptic symptoms, partial gastric outlet obstruction and weight loss of 60 pounds. Patient has had a distal gastrectomy, cholecystectomy with jejunal feeding tube placement by Dr. Barry Dienes on 01/10/2017 for gastric obstruction secondary to adenocarcinoma of the gastric pylorus. He was noted to have invaded into the pancreas and a bit of the anterior pancreatic head was resected en bloc with the tumor.  Pathology showed  " INVASIVE MODERATELY TO POORLY DIFFERENTIATED ADENOCARCINOMA, SPANNING 4 CM IN GREATEST DIMENSION. TUMOR INVADES THROUGH STOMACH WALL TO INVOLVE SEROSAL SURFACE.  LYMPH/VASCULAR INVASION IS IDENTIFIED.  MARGINS ARE NEGATIVE.- FIFTEEN OF THIRTY NINE LYMPH NODES POSITIVE FOR METASTATIC ADENOCARCINOMA (15/39). EXTRACAPSULAR EXTENSION IS IDENTIFIED. 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  Patient had a prolonged hospitalization post surgery  due to significant NG tube output. Needed epidural and PCA for pain control. It took a while for him to be able to tolerate progression of 2 feeding. He had an abdominal drain due to a small collection near his pancreatic head. Was treated with Augmentin for this. Patient was in the hospital from 01/10/2017 and  was eventually discharged on 01/27/2017.  Patient postponed several attempts at posthospitalization follow-up. He has been recovering at home and has been optimizing his tube feeding. Patient was recently followed up by Dr. Barry Dienes and has had a port placement on 02/23/2017 and preparation for possible chemotherapy.  He notes that his tube feeding is up to 40 mL per hour for 12 hours and has targeted is 16 ml per hour. He was recently admitted to [facility] in [place] for a C. difficile colitis that caused a severe diarrhea. he is currently on oral vancomycin has completed about 5 days of treatment and is also on probiotics.  CURRENT THERAPY:  Concurrent Chemoradiation with Xeloda. He started 07/18/17  INTERVAL HISTORY  [name] is here for f/u for monitoring treatment toxicities with his concurrent chemoradiation with Xeloda.  He notes he was just able to receive his Xeloda due to his insurance. He has been able to start it. Today is day 3 for him and his radiation started Tuesday. He reports no troubles with Xeloda currently. He denies nausea/vomitting and has not had to taken anti-nausea medication. He denies mouth sores and has been able to eat well by mouth and drink enough fluids..  His last port flush was at last chemo infusion.  He would like to get an extension of his leave from work, [employer] - he'll submit paperwork for completion. No other acute new symptoms. In good spirits overall.   MEDICAL HISTORY:  Past Medical History:  Diagnosis Date  . Anemia   . Cancer [facility])    Gastric Adenocarcinoma  . Gastric outlet obstruction 01/2017  . GERD (gastroesophageal reflux disease)   . Pre-diabetes    denies  . SVT (supraventricular tachycardia) ([place])    during Admission  and Surgery- 01/2017    SURGICAL HISTORY: Past Surgical History:  Procedure Laterality Date  . CHOLECYSTECTOMY N/A 01/10/2017   Procedure: CHOLECYSTECTOMY;  Surgeon: Stark Klein, MD;  Location: [place];  Service:  General;  Laterality: N/A;  . COLONOSCOPY    . COLONOSCOPY  10/11/2011   Procedure: COLONOSCOPY;  Surgeon: Daneil Dolin, MD;  [facility];  Service: Endoscopy;  Laterality: N/A;  10:30 AM  . ESOPHAGOGASTRODUODENOSCOPY N/A 12/30/2016   Procedure: ESOPHAGOGASTRODUODENOSCOPY (EGD);  Surgeon: Carol Ada, MD;  Location: [facility];  Service: Endoscopy;  Laterality: N/A;  . GASTRECTOMY N/A 01/10/2017   Procedure: DISTAL GASTRECTOMY, JEJUNUM  FEEDING TUBE PLACEMENT;  Surgeon: Stark Klein, MD;  Location: [place];  Service: General;  Laterality: N/A;  . IR GENERIC HISTORICAL  01/24/2017   IR [place] DUODEN/JEJUNO TUBE PERCUT W/FLUORO 01/24/2017 Aletta Edouard, MD [facility]-INTERV RAD  . IR REPLC DUODEN/JEJUNO TUBE PERCUT W/FLUORO  03/26/2017  . PORTACATH PLACEMENT N/A 02/23/2017   Procedure: INSERTION PORT-A-CATH;  Surgeon: Stark Klein, MD;  Location: [place];  Service: General;  Laterality: N/A;  . testicle removed  1982    SOCIAL HISTORY: Social History   Social History  . Marital status: Married    Spouse name: N/A  . Number of children: N/A  . Years of education: N/A   Occupational History  . Not on file.   Social History Main Topics  . Smoking status: Never Smoker  . Smokeless tobacco: Never Used  . Alcohol use No     Comment: occ beer or wine but none in last 6 months   . Drug use: No  . Sexual activity: Not on file   Other Topics Concern  . Not on file   Social History Narrative  . No narrative on file    FAMILY HISTORY: Family History  Problem Relation Age of Onset  . Prostate cancer Father   . Colon cancer Neg Hx     ALLERGIES:  is allergic to no known allergies.  MEDICATIONS:  Current Outpatient Prescriptions  Medication Sig Dispense Refill  . capecitabine (XELODA) 500 MG tablet Take 4 tablets (2,000 mg total) by mouth 2 (two) times daily after a meal. (monday through Friday each week) while on radiation. 224 tablet 0  . dexamethasone (DECADRON) 4 MG tablet  Take 2 tablets (8 mg total) by mouth daily. Start the day after chemotherapy for 2 days. Take with food. (Patient not taking: Reported on 06/28/2017) 30 tablet 1  . famotidine (PEPCID) 20 MG tablet Take 20 mg by mouth daily.     . ferrous sulfate 325 (65 FE) MG tablet Take 325 mg by mouth daily with breakfast.    . HYDROcodone-acetaminophen (NORCO/VICODIN) 5-325 MG tablet Take 1-2 tablets by mouth every 6 (six) hours as needed for moderate pain. 30 tablet 0  . lidocaine-prilocaine (EMLA) cream Apply to affected area once 30 g 3  . Multiple Vitamin (MULTIVITAMIN) tablet Take 1 tablet by mouth daily.    . ondansetron (ZOFRAN) 8 MG tablet Take 1 tablet (8 mg total) by mouth 2 (two) times daily as needed for refractory nausea / vomiting. Start on day 3 after chemotherapy. (Patient not taking: Reported on 06/08/2017) 30 tablet 1  . prochlorperazine (COMPAZINE) 10 MG tablet Take 1 tablet (10 mg total) by mouth every 6 (six) hours as needed (Nausea or vomiting). (Patient not taking: Reported on 06/08/2017) 30 tablet 1  . traZODone (DESYREL) 50 MG tablet TAKE 1 TABLET (50 MG TOTAL) BY MOUTH  AT BEDTIME AS NEEDED FOR SLEEP. (Patient not taking: Reported on 06/08/2017) 30 tablet 0   No current facility-administered medications for this visit.     REVIEW OF SYSTEMS:    10 Point review of Systems was done is negative except as noted above.  PHYSICAL EXAMINATION: ECOG PERFORMANCE STATUS: 2 - Symptomatic, <50% confined to bed  . Vitals:   07/20/17 1444  BP: (!) 129/59  Pulse: 75  Resp: 18  Temp: 97.8 F (36.6 C)  SpO2: 100%   Filed Weights   07/20/17 1444  Weight: 257 lb 4.8 oz (116.7 kg)   .Body mass index is 35.89 kg/m.   GENERAL:alert, in no acute distress and comfortable SKIN: no acute rashes, no significant lesions EYES: conjunctiva are pink and non-injected, sclera anicteric OROPHARYNX: MMM, no exudates, no oropharyngeal erythema or ulceration NECK: supple, no JVD LYMPH:  no palpable  lymphadenopathy in the cervical, axillary or inguinal regions LUNGS: clear to auscultation b/l with normal respiratory effort HEART: regular rate & rhythm ABDOMEN:  normoactive bowel sounds , non tender, not distended. Feeding tube site healing. Extremity: no pedal edema PSYCH: alert & oriented x 3 with fluent speech NEURO: no focal motor/sensory deficits  LABORATORY DATA:  I have reviewed the data as listed  . CBC Latest Ref Rng & Units 07/20/2017 06/28/2017 06/14/2017  WBC 4.0 - 10.3 10e3/uL 4.0 2.6(L) 3.8(L)  Hemoglobin 13.0 - 17.1 g/dL 11.6(L) 11.1(L) 10.8(L)  Hematocrit 38.4 - 49.9 % 36.2(L) 34.1(L) 33.9(L)  Platelets 140 - 400 10e3/uL 171 119(L) 145  ANC 1.2k  . CMP Latest Ref Rng & Units 07/20/2017 06/28/2017 06/14/2017  Glucose 70 - 140 mg/dl 135 148(H) 107  BUN 7.0 - 26.0 mg/dL 9.8 10.2 9.5  Creatinine 0.7 - 1.3 mg/dL 0.9 0.8 0.8  Sodium 136 - 145 mEq/L 142 141 143  Potassium 3.5 - 5.1 mEq/L 3.8 3.7 3.6  Chloride 101 - 111 mmol/L - - -  CO2 22 - 29 mEq/L 24 25 26   Calcium 8.4 - 10.4 mg/dL 9.7 9.5 9.6  Total Protein 6.4 - 8.3 g/dL 6.9 6.7 7.1  Total Bilirubin 0.20 - 1.20 mg/dL 0.69 0.60 0.40  Alkaline Phos 40 - 150 U/L 69 75 80  AST 5 - 34 U/L 18 27 19   ALT 0 - 55 U/L 10 18 13       RADIOGRAPHIC STUDIES: I have personally reviewed the radiological images as listed and agreed with the findings in the report. Ct Chest W Contrast  Result Date: 06/21/2017 CLINICAL DATA:  Followup gastric adenocarcinoma. Followup postop fluid collection. EXAM: CT CHEST, ABDOMEN, AND PELVIS WITH CONTRAST TECHNIQUE: Multidetector CT imaging of the chest, abdomen and pelvis was performed following the standard protocol during bolus administration of intravenous contrast. CONTRAST:  115mL ISOVUE-300 IOPAMIDOL (ISOVUE-300) INJECTION 61% COMPARISON:  03/21/2017 FINDINGS: CT CHEST FINDINGS Cardiovascular: No acute findings. Mediastinum/Lymph Nodes: No masses or pathologically enlarged lymph nodes  identified. Lungs/Pleura: No pulmonary infiltrate or mass identified. No effusion present. Musculoskeletal:  No suspicious bone lesions identified. CT ABDOMEN AND PELVIS FINDINGS Hepatobiliary: No masses identified. Prior cholecystectomy. No evidence of biliary dilatation. Pancreas: No mass or ductal dilatation. Previously seen fluid collection adjacent pancreatic head has resolved. Near complete resolution of adjacent inflammatory changes. Spleen:  Within normal limits in size and appearance. Adrenals/Urinary tract:  No masses or hydronephrosis. Stomach/Bowel: Small hiatal hernia. Jejunostomy tube is been removed since previous study. No evidence of obstruction, inflammatory process, or abnormal fluid collections. Vascular/Lymphatic: No pathologically enlarged lymph nodes identified.  No abdominal aortic aneurysm. Reproductive:  No mass or other significant abnormality identified. Other: Stable small left inguinal hernia with associated varicocele. Musculoskeletal:  No suspicious bone lesions identified. IMPRESSION: Resolution of peripancreatic fluid collection since previous study, and near complete resolution of other postop changes in right upper quadrant. No evidence of residual or metastatic carcinoma within the chest, abdomen, or pelvis. Electronically Signed   By: Earle Gell M.D.   On: 06/21/2017 15:13   Ct Abdomen Pelvis W Contrast  Result Date: 06/21/2017 CLINICAL DATA:  Followup gastric adenocarcinoma. Followup postop fluid collection. EXAM: CT CHEST, ABDOMEN, AND PELVIS WITH CONTRAST TECHNIQUE: Multidetector CT imaging of the chest, abdomen and pelvis was performed following the standard protocol during bolus administration of intravenous contrast. CONTRAST:  175mL ISOVUE-300 IOPAMIDOL (ISOVUE-300) INJECTION 61% COMPARISON:  03/21/2017 FINDINGS: CT CHEST FINDINGS Cardiovascular: No acute findings. Mediastinum/Lymph Nodes: No masses or pathologically enlarged lymph nodes identified. Lungs/Pleura: No  pulmonary infiltrate or mass identified. No effusion present. Musculoskeletal:  No suspicious bone lesions identified. CT ABDOMEN AND PELVIS FINDINGS Hepatobiliary: No masses identified. Prior cholecystectomy. No evidence of biliary dilatation. Pancreas: No mass or ductal dilatation. Previously seen fluid collection adjacent pancreatic head has resolved. Near complete resolution of adjacent inflammatory changes. Spleen:  Within normal limits in size and appearance. Adrenals/Urinary tract:  No masses or hydronephrosis. Stomach/Bowel: Small hiatal hernia. Jejunostomy tube is been removed since previous study. No evidence of obstruction, inflammatory process, or abnormal fluid collections. Vascular/Lymphatic: No pathologically enlarged lymph nodes identified. No abdominal aortic aneurysm. Reproductive:  No mass or other significant abnormality identified. Other: Stable small left inguinal hernia with associated varicocele. Musculoskeletal:  No suspicious bone lesions identified. IMPRESSION: Resolution of peripancreatic fluid collection since previous study, and near complete resolution of other postop changes in right upper quadrant. No evidence of residual or metastatic carcinoma within the chest, abdomen, or pelvis. Electronically Signed   By: Earle Gell M.D.   On: 06/21/2017 15:13    ASSESSMENT & PLAN:   66 year old African-American male with  1) Resected Stage IV pT4a, pN3b, pM1 invasive moderately-poorly Gastric Adenocarcinoma . Her 2 neg by FISH LVI + margins neg 15/39 LN +ve, Extracapsular invasion +, some involvement of pancreatic capsule there pM1 Presented with severe pyloric stenosis with ulceration. 60 pound weight loss. S/p distal gastrectomy, cholecystectomy and en bloc dissection off the small part of the pancreatic head  with jejunal feeding tube placement. Initial CT abdomen and pelvis did not show any local regional lymphadenopathy. Rpt CT chest/abd/pelvis from 03/22/2017 - no overt  evidence of residual disease or metastatic disease. Post-surgical peripancreatic fluid collection resolving. CT chest/abd/pelvis 06/21/2017 after 6 cycles of FOLFOX -  No evidence of residual or metastatic carcinoma within the chest, abdomen, or pelvis -Started Chemoradiation on 07/18/17 with Xeloda (825mg /m2 twice daily Monday through Friday while on radiation), so far tolerating well.     #2 s/p moderate protein calorie malnutrition patient had lost about 60 pounds in the last 2-3 months but has been gaining back his lost weight well.  . 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  #4 status post port placement on 02/23/2017  -Port flush in 2 weeks when he comes for follow-up and then every 6 weeks after that. -If he has NED status after completion of concurrent  chemoradiation will consider port removal.  #5 recent Clostridium difficile colitis -. No overt issues with diarrhea currently. Has completed his prolonged po vancomycin course.   #6 suspected sleep apnea.Has not had a sleep study.Wife notes significant snoring which is important with weight loss. Will have to be careful with excessive sedation. -continue prn low dose trazodone for insomnia -recommended he f/u with his PCP for consideration of sleep study-pending   Plan -Patient is on concurrent chemoradiation with Xeloda (825mg /m2 twice daily Monday through Friday while on radiation) started 07/18/17 and will end 08/24/17 -Repeat CT scan in 12 weeks  -Patient submitted FMLA to clinic today  #6 suspected sleep apnea.Has not had a sleep study.Wife notes significant snoring which is important with weight loss. Will have to be careful with excessive sedation. -continue prn low dose trazodone for insomnia -recommended he f/u with his PCP for  consideration of sleep study-pending  RTC with Dr Irene Limbo in 2 weeks with labs Port flush in 2 weeks and then q6weeks Continue chemoradiation with Xeloda and Dr. Lisbeth Renshaw   All of the patients questions were answered with apparent satisfaction. The patient knows to call the clinic with any problems, questions or concerns.  I spent 20 minutes counseling the patient face to face. The total time spent in the appointment was 25 minutes and more than 50% was on counseling and direct patient cares.   All of the patients questions were answered with apparent satisfaction. The patient knows to call the clinic with any problems, questions or concerns.  This document serves as a record of services personally performed by Sullivan Lone, MD. It was created on her behalf by Joslyn Devon, a trained medical scribe. The creation of this record is based on the scribe's personal observations and the provider's statements to them. This document has been checked and approved by the attending provider.    Sullivan Lone MD [place] AAHIVMS [facility] Hematology/Oncology Physician [facility]  (Office):       [phone] (Work cell):  [phone] (Fax):           [phone]

## 2017-07-19 ENCOUNTER — Ambulatory Visit: Payer: BLUE CROSS/BLUE SHIELD | Admitting: Hematology

## 2017-07-19 ENCOUNTER — Ambulatory Visit
Admission: RE | Admit: 2017-07-19 | Discharge: 2017-07-19 | Disposition: A | Discharge status: Home / Self Care | Payer: BLUE CROSS/BLUE SHIELD | Source: Ambulatory Visit | Attending: Radiation Oncology | Admitting: Radiation Oncology

## 2017-07-19 ENCOUNTER — Other Ambulatory Visit: Payer: BLUE CROSS/BLUE SHIELD

## 2017-07-19 ENCOUNTER — Ambulatory Visit: Payer: BLUE CROSS/BLUE SHIELD | Admitting: Radiation Oncology

## 2017-07-19 DIAGNOSIS — Z510 Encounter for antineoplastic radiation therapy: Secondary | ICD-10-CM | POA: Diagnosis not present

## 2017-07-20 ENCOUNTER — Other Ambulatory Visit (HOSPITAL_BASED_OUTPATIENT_CLINIC_OR_DEPARTMENT_OTHER): Payer: Medicare Other

## 2017-07-20 ENCOUNTER — Encounter: Payer: Self-pay | Admitting: Hematology

## 2017-07-20 ENCOUNTER — Ambulatory Visit
Admission: RE | Admit: 2017-07-20 | Discharge: 2017-07-20 | Disposition: A | Discharge status: Home / Self Care | Payer: BLUE CROSS/BLUE SHIELD | Source: Ambulatory Visit | Attending: Radiation Oncology | Admitting: Radiation Oncology

## 2017-07-20 ENCOUNTER — Ambulatory Visit (HOSPITAL_BASED_OUTPATIENT_CLINIC_OR_DEPARTMENT_OTHER): Payer: Medicare Other | Admitting: Hematology

## 2017-07-20 VITALS — BP 129/59 | HR 75 | Temp 97.8°F | Resp 18 | Ht 71.0 in | Wt 257.3 lb

## 2017-07-20 DIAGNOSIS — Z510 Encounter for antineoplastic radiation therapy: Secondary | ICD-10-CM | POA: Diagnosis not present

## 2017-07-20 DIAGNOSIS — C164 Malignant neoplasm of pylorus: Secondary | ICD-10-CM

## 2017-07-20 DIAGNOSIS — G4700 Insomnia, unspecified: Secondary | ICD-10-CM | POA: Diagnosis not present

## 2017-07-20 DIAGNOSIS — R0683 Snoring: Secondary | ICD-10-CM | POA: Diagnosis not present

## 2017-07-20 DIAGNOSIS — D500 Iron deficiency anemia secondary to blood loss (chronic): Secondary | ICD-10-CM | POA: Diagnosis not present

## 2017-07-20 DIAGNOSIS — D630 Anemia in neoplastic disease: Secondary | ICD-10-CM | POA: Diagnosis not present

## 2017-07-20 DIAGNOSIS — D6481 Anemia due to antineoplastic chemotherapy: Secondary | ICD-10-CM | POA: Diagnosis not present

## 2017-07-20 DIAGNOSIS — C160 Malignant neoplasm of cardia: Secondary | ICD-10-CM

## 2017-07-20 DIAGNOSIS — C163 Malignant neoplasm of pyloric antrum: Secondary | ICD-10-CM

## 2017-07-20 LAB — COMPREHENSIVE METABOLIC PANEL
ALT: 10 U/L (ref 0–55)
AST: 18 U/L (ref 5–34)
Albumin: 3.3 g/dL — ABNORMAL LOW (ref 3.5–5.0)
Alkaline Phosphatase: 69 U/L (ref 40–150)
Anion Gap: 10 mEq/L (ref 3–11)
BUN: 9.8 mg/dL (ref 7.0–26.0)
CO2: 24 mEq/L (ref 22–29)
Calcium: 9.7 mg/dL (ref 8.4–10.4)
Chloride: 108 mEq/L (ref 98–109)
Creatinine: 0.9 mg/dL (ref 0.7–1.3)
EGFR: >90 mL/min/{1.73_m2} (ref >90)
Glucose: 135 mg/dl (ref 70–140)
Potassium: 3.8 mEq/L (ref 3.5–5.1)
Sodium: 142 mEq/L (ref 136–145)
Total Bilirubin: 0.69 mg/dL (ref 0.20–1.20)
Total Protein: 6.9 g/dL (ref 6.4–8.3)

## 2017-07-20 LAB — CBC WITH DIFFERENTIAL/PLATELET
BASO%: 0.5 % (ref 0.0–2.0)
Basophils Absolute: 0 10*3/uL (ref 0.0–0.1)
EOS%: 1.3 % (ref 0.0–7.0)
Eosinophils Absolute: 0.1 10*3/uL (ref 0.0–0.5)
HCT: 36.2 % — ABNORMAL LOW (ref 38.4–49.9)
HGB: 11.6 g/dL — ABNORMAL LOW (ref 13.0–17.1)
LYMPH%: 19.8 % (ref 14.0–49.0)
MCH: 26.2 pg — ABNORMAL LOW (ref 27.2–33.4)
MCHC: 32 g/dL (ref 32.0–36.0)
MCV: 81.7 fL (ref 79.3–98.0)
MONO#: 0.4 10*3/uL (ref 0.1–0.9)
MONO%: 10 % (ref 0.0–14.0)
NEUT#: 2.7 10*3/uL (ref 1.5–6.5)
NEUT%: 68.4 % (ref 39.0–75.0)
Platelets: 171 10*3/uL (ref 140–400)
RBC: 4.43 10*6/uL (ref 4.20–5.82)
RDW: 18.1 % — ABNORMAL HIGH (ref 11.0–14.6)
WBC: 4 10*3/uL (ref 4.0–10.3)
lymph#: 0.8 10*3/uL — ABNORMAL LOW (ref 0.9–3.3)
nRBC: 0 % (ref 0–0)

## 2017-07-20 LAB — TECHNOLOGIST REVIEW

## 2017-07-20 NOTE — Patient Instructions (Signed)
Thank you for choosing [facility] to provide your oncology and hematology care.  To afford each patient quality time with our providers, please arrive 30 minutes before your scheduled appointment time.  If you arrive late for your appointment, you may be asked to reschedule.  We strive to give you quality time with our providers, and arriving late affects you and other patients whose appointments are after yours.   If you are a no show for multiple scheduled visits, you may be dismissed from the clinic at the providers discretion.    Again, thank you for choosing [facility], our hope is that these requests will decrease the amount of time that you wait before being seen by our physicians.  ______________________________________________________________________  Should you have questions after your visit to the  Cancer Center, please contact our office at [phone] between the hours of 8:30 and 4:30 p.m.    Voicemails left after 4:30p.m will not be returned until the following business day.    For prescription refill requests, please have your pharmacy contact us directly.  Please also try to allow 48 hours for prescription requests.    Please contact the scheduling department for questions regarding scheduling.  For scheduling of procedures such as PET scans, CT scans, MRI, Ultrasound, etc please contact central scheduling at [phone].    Resources For Cancer Patients and Caregivers:   Oncolink.org:  A wonderful resource for patients and healthcare providers for information regarding your disease, ways to tract your treatment, what to expect, etc.     American Cancer Society:  800-227-2345  Can help patients locate various types of support and financial assistance  Cancer Care: 1-800-813-HOPE (4673) Provides financial assistance, online support groups, medication/co-pay assistance.    [place] DSS:  336-641-3447 Where to apply for food  stamps, Medicaid, and utility assistance  Medicare Rights Center: 800-333-4114 Helps people with Medicare understand their rights and benefits, navigate the Medicare system, and secure the quality healthcare they deserve  SCAT: 336-333-6589 [place] [employer]'s shared-ride transportation service for eligible riders who have a disability that prevents them from riding the fixed route bus.    For additional information on assistance programs please contact our social worker:   Grier Hock/Abigail Elmore:  [phone]            

## 2017-07-21 ENCOUNTER — Ambulatory Visit
Admission: RE | Admit: 2017-07-21 | Discharge: 2017-07-21 | Disposition: A | Discharge status: Home / Self Care | Payer: BLUE CROSS/BLUE SHIELD | Source: Ambulatory Visit | Attending: Radiation Oncology | Admitting: Radiation Oncology

## 2017-07-21 DIAGNOSIS — Z510 Encounter for antineoplastic radiation therapy: Secondary | ICD-10-CM | POA: Diagnosis not present

## 2017-07-21 NOTE — Progress Notes (Addendum)
  Radiation Oncology         [phone] ________________________________  Name: [name] MRN: [id number]  Date: 07/11/2017  DOB: May 18, 1951  SIMULATION AND TREATMENT PLANNING NOTE  DIAGNOSIS:     ICD-10-CM   1. 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  Identity was confirmed.  All relevant records and images related to the planned course of therapy were reviewed.   Written consent to proceed with treatment was confirmed which was freely given after reviewing the details related to the planned course of therapy had been reviewed with the patient.  Then, the patient was set-up in a stable reproducible  supine position for radiation therapy.  CT images were obtained.  Surface markings were placed.    Medically necessary complex treatment device(s) for immobilization:  Customized vac lock bag.   The CT images were loaded into the planning software.  Then the target and avoidance structures were contoured.  Treatment planning then occurred.  The radiation prescription was entered and confirmed.  A total of 8 complex treatment devices were fabricated which relate to the designed radiation treatment fields with the patient being treated on our tomotherapy unit using a 3-D conformal mode of therapy. The number of treatment fields is based on the treatment sinogram. Each of these customized fields/ complex treatment devices will be used on a daily basis during the radiation course. I have requested : 3D Simulation  I have requested a DVH of the following structures: Target volume, spinal cord, left kidney, right kidney, liver.   The patient will undergo daily image guidance to ensure accurate localization of the target, and adequate minimize dose to the normal surrounding structures in close proximity to the target.   PLAN:  The patient will receive 45 Gy in 25 fractions initially. The  patient will then receive a 5.4 gray boost to yield a final total dose of 50.4 gray.  Special treatment procedure The patient will also receive concurrent chemotherapy during the treatment. The patient may therefore experience increased toxicity or side effects and the patient will be monitored for such problems. This may require extra lab work as necessary. This therefore constitutes a special treatment procedure.    ________________________________   Jodelle Gross, MD, PhD

## 2017-07-21 NOTE — Addendum Note (Signed)
Encounter addended by: Kyung Rudd, MD on: 07/21/2017  4:50 PM<BR>    Actions taken: Sign clinical note

## 2017-07-23 ENCOUNTER — Ambulatory Visit: Payer: BLUE CROSS/BLUE SHIELD

## 2017-07-24 ENCOUNTER — Ambulatory Visit
Admission: RE | Admit: 2017-07-24 | Discharge: 2017-07-24 | Disposition: A | Discharge status: Home / Self Care | Payer: BLUE CROSS/BLUE SHIELD | Source: Ambulatory Visit | Attending: Radiation Oncology | Admitting: Radiation Oncology

## 2017-07-24 DIAGNOSIS — Z510 Encounter for antineoplastic radiation therapy: Secondary | ICD-10-CM | POA: Diagnosis not present

## 2017-07-25 ENCOUNTER — Ambulatory Visit
Admission: RE | Admit: 2017-07-25 | Discharge: 2017-07-25 | Disposition: A | Discharge status: Home / Self Care | Payer: BLUE CROSS/BLUE SHIELD | Source: Ambulatory Visit | Attending: Radiation Oncology | Admitting: Radiation Oncology

## 2017-07-25 DIAGNOSIS — Z510 Encounter for antineoplastic radiation therapy: Secondary | ICD-10-CM | POA: Diagnosis not present

## 2017-07-26 ENCOUNTER — Ambulatory Visit
Admission: RE | Admit: 2017-07-26 | Discharge: 2017-07-26 | Disposition: A | Discharge status: Home / Self Care | Payer: BLUE CROSS/BLUE SHIELD | Source: Ambulatory Visit | Attending: Radiation Oncology | Admitting: Radiation Oncology

## 2017-07-26 ENCOUNTER — Telehealth: Payer: Self-pay | Admitting: Hematology

## 2017-07-26 DIAGNOSIS — Z510 Encounter for antineoplastic radiation therapy: Secondary | ICD-10-CM | POA: Diagnosis not present

## 2017-07-26 NOTE — Telephone Encounter (Signed)
Scheduled appt per 9/6 los - left message with appt date and time.

## 2017-07-27 ENCOUNTER — Ambulatory Visit
Admission: RE | Admit: 2017-07-27 | Discharge: 2017-07-27 | Disposition: A | Discharge status: Home / Self Care | Payer: BLUE CROSS/BLUE SHIELD | Source: Ambulatory Visit | Attending: Radiation Oncology | Admitting: Radiation Oncology

## 2017-07-27 DIAGNOSIS — Z510 Encounter for antineoplastic radiation therapy: Secondary | ICD-10-CM | POA: Diagnosis not present

## 2017-07-28 ENCOUNTER — Ambulatory Visit
Admission: RE | Admit: 2017-07-28 | Discharge: 2017-07-28 | Disposition: A | Discharge status: Home / Self Care | Payer: BLUE CROSS/BLUE SHIELD | Source: Ambulatory Visit | Attending: Radiation Oncology | Admitting: Radiation Oncology

## 2017-07-28 DIAGNOSIS — Z510 Encounter for antineoplastic radiation therapy: Secondary | ICD-10-CM | POA: Diagnosis not present

## 2017-07-31 ENCOUNTER — Ambulatory Visit
Admission: RE | Admit: 2017-07-31 | Discharge: 2017-07-31 | Disposition: A | Discharge status: Home / Self Care | Payer: BLUE CROSS/BLUE SHIELD | Source: Ambulatory Visit | Attending: Radiation Oncology | Admitting: Radiation Oncology

## 2017-07-31 DIAGNOSIS — Z510 Encounter for antineoplastic radiation therapy: Secondary | ICD-10-CM | POA: Diagnosis not present

## 2017-08-01 ENCOUNTER — Ambulatory Visit
Admission: RE | Admit: 2017-08-01 | Discharge: 2017-08-01 | Disposition: A | Discharge status: Home / Self Care | Payer: BLUE CROSS/BLUE SHIELD | Source: Ambulatory Visit | Attending: Radiation Oncology | Admitting: Radiation Oncology

## 2017-08-01 DIAGNOSIS — Z510 Encounter for antineoplastic radiation therapy: Secondary | ICD-10-CM | POA: Diagnosis not present

## 2017-08-02 ENCOUNTER — Ambulatory Visit
Admission: RE | Admit: 2017-08-02 | Discharge: 2017-08-02 | Disposition: A | Discharge status: Home / Self Care | Payer: BLUE CROSS/BLUE SHIELD | Source: Ambulatory Visit | Attending: Radiation Oncology | Admitting: Radiation Oncology

## 2017-08-02 DIAGNOSIS — Z510 Encounter for antineoplastic radiation therapy: Secondary | ICD-10-CM | POA: Diagnosis not present

## 2017-08-02 NOTE — Progress Notes (Signed)
HEMATOLOGY/ONCOLOGY CLINIC NOTE  Date of Service: .08/03/2017  Patient Care Team: [name], MD as PCP - General (Internal Medicine) [name] (Surgery)    CHIEF COMPLAINTS/PURPOSE OF CONSULTATION:   f/u for gastric Adenocarcinoma.  HISTORY OF PRESENTING ILLNESS:   [name] is a wonderful 66 y.o. male is here for continued evaluation and management of recently diagnosed pT4a, pN3b, pM1 Gastric Adenocarcinoma.  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 Patient had presented to primary care physician with dyspeptic symptoms, partial gastric outlet obstruction and weight loss of 60 pounds. Patient has had a distal gastrectomy, cholecystectomy with jejunal feeding tube placement by Dr. Barry Dienes on 01/10/2017 for gastric obstruction secondary to adenocarcinoma of the gastric pylorus. He was noted to have invaded into the pancreas and a bit of the anterior pancreatic head was resected en bloc with the tumor.  Pathology showed  " INVASIVE MODERATELY TO POORLY DIFFERENTIATED ADENOCARCINOMA, SPANNING 4 CM IN GREATEST DIMENSION. TUMOR INVADES THROUGH STOMACH WALL TO INVOLVE SEROSAL SURFACE.  LYMPH/VASCULAR INVASION IS IDENTIFIED.  MARGINS ARE NEGATIVE.- FIFTEEN OF THIRTY NINE LYMPH NODES POSITIVE FOR METASTATIC ADENOCARCINOMA (15/39). EXTRACAPSULAR EXTENSION IS IDENTIFIED. 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  Patient had a prolonged hospitalization post surgery  due to significant NG tube output. Needed epidural and PCA for pain control. It took a while for him to be able to tolerate progression of 2 feeding. He had an abdominal drain due to a small collection near his pancreatic head. Was treated with Augmentin for this. Patient was in the hospital from 01/10/2017 and  was eventually discharged on 01/27/2017.  Patient postponed several attempts at posthospitalization follow-up. He has been recovering at home and has been optimizing his tube feeding. Patient was recently followed up by Dr. Barry Dienes and has had a port placement on 02/23/2017 and preparation for possible chemotherapy.  He notes that his tube feeding is up to 40 mL per hour for 12 hours and has targeted is 16 ml per hour. He was recently admitted to [facility] in [place] for a C. difficile colitis that caused a severe diarrhea. he is currently on oral vancomycin has completed about 5 days of treatment and is also on probiotics.  CURRENT THERAPY:  Concurrent Chemoradiation with Xeloda. He started 07/18/17  INTERVAL HISTORY  [name] is here for f/u for monitoring treatment toxicities with his concurrent chemoradiation with Xeloda.  He began radiation treatment on 07/18/17. Denies nausea. He does report diarrhea, which is managed with Imodium. Denies fever or chills. No other overt primary toxicities from his oral Xeloda at this time. He notes he has been gaining endurance. He is able to walk faster without feeling as worn out. He plans to begin walking up stairs to gain more endurance. He is not in physical therapy. The patient currently sees Dr. Vista Lawman for primary care.  MEDICAL HISTORY:  Past Medical History:  Diagnosis Date  . Anemia   . Cancer [facility])    Gastric Adenocarcinoma  . Gastric outlet obstruction 01/2017  . GERD (gastroesophageal reflux disease)   . Pre-diabetes    denies  . SVT (supraventricular tachycardia) ([place])    during Admission and Surgery- 01/2017    SURGICAL HISTORY: Past Surgical History:  Procedure Laterality Date  . CHOLECYSTECTOMY N/A 01/10/2017   Procedure: CHOLECYSTECTOMY;  Surgeon: Stark Klein, MD;  Location: [facility] OR;  Service: General;  Laterality: N/A;  . COLONOSCOPY    . COLONOSCOPY  10/11/2011   Procedure: COLONOSCOPY;  Surgeon: Daneil Dolin, MD;   Location: [facility] ENDO SUITE;  Service: Endoscopy;  Laterality: N/A;  10:30 AM  . ESOPHAGOGASTRODUODENOSCOPY N/A 12/30/2016   Procedure: ESOPHAGOGASTRODUODENOSCOPY (EGD);  Surgeon: Carol Ada, MD;  Location: [facility];  Service: Endoscopy;  Laterality: N/A;  . GASTRECTOMY N/A 01/10/2017   Procedure: DISTAL GASTRECTOMY, JEJUNUM  FEEDING TUBE PLACEMENT;  Surgeon: Stark Klein, MD;  Location: [facility];  Service: General;  Laterality: N/A;  . IR GENERIC HISTORICAL  01/24/2017   IR [place] DUODEN/JEJUNO TUBE PERCUT W/FLUORO 01/24/2017 Aletta Edouard, MD [facility]-INTERV RAD  . IR REPLC DUODEN/JEJUNO TUBE PERCUT W/FLUORO  03/26/2017  . PORTACATH PLACEMENT N/A 02/23/2017   Procedure: INSERTION PORT-A-CATH;  Surgeon: Stark Klein, MD;  Location: [place];  Service: General;  Laterality: N/A;  . testicle removed  1982    SOCIAL HISTORY: Social History   Social History  . Marital status: Married    Spouse name: N/A  . Number of children: N/A  . Years of education: N/A   Occupational History  . Not on file.   Social History Main Topics  . Smoking status: Never Smoker  . Smokeless tobacco: Never Used  . Alcohol use No     Comment: occ beer or wine but none in last 6 months   . Drug use: No  . Sexual activity: Not on file   Other Topics Concern  . Not on file   Social History Narrative  . No narrative on file    FAMILY HISTORY: Family History  Problem Relation Age of Onset  . Prostate cancer Father   . Colon cancer Neg Hx     ALLERGIES:  is allergic to no known allergies.  MEDICATIONS:  Current Outpatient Prescriptions  Medication Sig Dispense Refill  . capecitabine (XELODA) 500 MG tablet Take 4 tablets (2,000 mg total) by mouth 2 (two) times daily after a meal. (monday through Friday each week) while on radiation. 224 tablet 0  . dexamethasone (DECADRON) 4 MG tablet Take 2 tablets (8 mg total) by mouth daily. Start the day after chemotherapy for 2 days. Take with food. 30 tablet 1  . famotidine  (PEPCID) 20 MG tablet Take 20 mg by mouth daily.     . ferrous sulfate 325 (65 FE) MG tablet Take 325 mg by mouth daily with breakfast.    . HYDROcodone-acetaminophen (NORCO/VICODIN) 5-325 MG tablet Take 1-2 tablets by mouth every 6 (six) hours as needed for moderate pain. 30 tablet 0  . lidocaine-prilocaine (EMLA) cream Apply to affected area once 30 g 3  . Multiple Vitamin (MULTIVITAMIN) tablet Take 1 tablet by mouth daily.    . ondansetron (ZOFRAN) 8 MG tablet Take 1 tablet (8 mg total) by mouth every 8 (eight) hours as needed for refractory nausea / vomiting. Start on day 3 after chemotherapy. 30 tablet 1  . prochlorperazine (COMPAZINE) 10 MG tablet Take 1 tablet (10 mg total) by mouth every 6 (six) hours as needed (Nausea or vomiting). 30 tablet 1  . traZODone (DESYREL) 50 MG tablet TAKE 1 TABLET (50 MG TOTAL) BY MOUTH AT BEDTIME AS NEEDED FOR SLEEP. 30 tablet 0   No current facility-administered medications for this visit.     REVIEW OF SYSTEMS:    10 Point review of Systems was done is negative except as noted above.  PHYSICAL EXAMINATION: ECOG PERFORMANCE STATUS: 2 -  Symptomatic, <50% confined to bed  . Vitals:   08/03/17 1521  BP: (!) 133/48  Pulse: 68  Resp: 18  Temp: 97.9 F (36.6 C)  SpO2: 100%   Filed Weights   08/03/17 1521  Weight: 263 lb 11.2 oz (119.6 kg)   .Body mass index is 36.78 kg/m.   GENERAL:alert, in no acute distress and comfortable SKIN: no acute rashes, no significant lesions EYES: conjunctiva are pink and non-injected, sclera anicteric OROPHARYNX: MMM, no exudates, no oropharyngeal erythema or ulceration NECK: supple, no JVD LYMPH:  no palpable lymphadenopathy in the cervical, axillary or inguinal regions LUNGS: clear to auscultation b/l with normal respiratory effort HEART: regular rate & rhythm ABDOMEN:  normoactive bowel sounds , non tender, not distended. Feeding tube site healing. Extremity: no pedal edema PSYCH: alert & oriented x 3  with fluent speech NEURO: no focal motor/sensory deficits  LABORATORY DATA:  I have reviewed the data as listed  . CBC Latest Ref Rng & Units 08/03/2017 07/20/2017 06/28/2017  WBC 4.0 - 10.3 10e3/uL 3.1(L) 4.0 2.6(L)  Hemoglobin 13.0 - 17.1 g/dL 11.1(L) 11.6(L) 11.1(L)  Hematocrit 38.4 - 49.9 % 34.0(L) 36.2(L) 34.1(L)  Platelets 140 - 400 10e3/uL 103(L) 171 119(L)   ANC 2.3k . CMP Latest Ref Rng & Units 08/03/2017 07/20/2017 06/28/2017  Glucose 70 - 140 mg/dl 132 135 148(H)  BUN 7.0 - 26.0 mg/dL 7.7 9.8 10.2  Creatinine 0.7 - 1.3 mg/dL 0.8 0.9 0.8  Sodium 136 - 145 mEq/L 141 142 141  Potassium 3.5 - 5.1 mEq/L 3.7 3.8 3.7  Chloride 101 - 111 mmol/L - - -  CO2 22 - 29 mEq/L 25 24 25   Calcium 8.4 - 10.4 mg/dL 9.4 9.7 9.5  Total Protein 6.4 - 8.3 g/dL 6.6 6.9 6.7  Total Bilirubin 0.20 - 1.20 mg/dL 0.63 0.69 0.60  Alkaline Phos 40 - 150 U/L 66 69 75  AST 5 - 34 U/L 21 18 27   ALT 0 - 55 U/L 11 10 18       RADIOGRAPHIC STUDIES: I have personally reviewed the radiological images as listed and agreed with the findings in the report. No results found.  ASSESSMENT & PLAN:   66 year old African-American male with  1) Resected Stage IV pT4a, pN3b, pM1 invasive moderately-poorly Gastric Adenocarcinoma . Her 2 neg by FISH LVI + margins neg 15/39 LN +ve, Extracapsular invasion +, some involvement of pancreatic capsule there pM1 Presented with severe pyloric stenosis with ulceration. S/p 60 pound weight loss- now resolved. S/p distal gastrectomy, cholecystectomy and en bloc dissection off the small part of the pancreatic head  with jejunal feeding tube placement. Initial CT abdomen and pelvis did not show any local regional lymphadenopathy. Rpt CT chest/abd/pelvis from 03/22/2017 - no overt evidence of residual disease or metastatic disease. Post-surgical peripancreatic fluid collection resolving. CT chest/abd/pelvis 06/21/2017 after 6 cycles of FOLFOX -  No evidence of residual or metastatic  carcinoma within the chest, abdomen, or pelvis -Started Chemoradiation on 07/18/17 with Xeloda (825mg /m2 twice daily Monday through Friday while on radiation), so far tolerating well.   #2 s/p moderate protein calorie malnutrition patient had lost about 60 pounds in the last 2-3 months but has been gaining back his lost weight well.  . 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  #4 status post port placement on 02/23/2017  -Port flush today and then every 6 weeks. -If he has NED status after completion of concurrent chemoradiation will consider port removal.  #5 recent Clostridium difficile colitis -. No overt issues with diarrhea currently. Has completed his prolonged po vancomycin course.   #6 suspected sleep apnea.Has not had a sleep study.Wife notes significant snoring which is important with weight loss. Will have to be careful with excessive sedation. -continue prn low dose trazodone for insomnia -recommended he f/u with his PCP for consideration of sleep study-pending   Plan -Patient is on concurrent chemoradiation with Xeloda (825mg /m2 twice daily Monday through Friday while on radiation) started 07/18/17 and will end 08/24/17 -Labs reviewed. -Patient has no prohibitive toxicities from treatment at this time. -Discussed referral to outpatient cancer rehab for fatigue and endurance and patient is agreeable for this. Given referral  -Repeat CT scan 4-6 weeks after he finishes treatment  #6 suspected sleep apnea.Has not had a sleep study.Wife notes significant snoring which is important with weight loss. Will have to be careful with excessive sedation. -continue prn low dose trazodone for insomnia -recommended he f/u with his PCP for consideration of sleep study-pending  RTC with Dr  Irene Limbo in 2 weeks with labs Port flush today and then every 6 weeks Refill zofran today Referral to cancer rehab for outpatient PT Continue chemoradiation with Xeloda and Dr. Lisbeth Renshaw   All of the patients questions were answered with apparent satisfaction. The patient knows to call the clinic with any problems, questions or concerns.  I spent 20 minutes counseling the patient face to face. The total time spent in the appointment was 25 minutes and more than 50% was on counseling and direct patient cares.   All of the patients questions were answered with apparent satisfaction. The patient knows to call the clinic with any problems, questions or concerns.  This document serves as a record of services personally performed by Sullivan Lone, MD. It was created on his behalf by Arlyce Harman, a trained medical scribe. The creation of this record is based on the scribe's personal observations and the provider's statements to them. This document has been checked and approved by the attending provider.   Sullivan Lone MD [place] AAHIVMS [facility] Hematology/Oncology Physician [facility]  (Office):       [phone] (Work cell):  [phone] (Fax):           [phone]

## 2017-08-03 ENCOUNTER — Ambulatory Visit (HOSPITAL_BASED_OUTPATIENT_CLINIC_OR_DEPARTMENT_OTHER): Payer: Medicare Other | Admitting: Hematology

## 2017-08-03 ENCOUNTER — Telehealth: Payer: Self-pay | Admitting: Hematology

## 2017-08-03 ENCOUNTER — Ambulatory Visit (HOSPITAL_BASED_OUTPATIENT_CLINIC_OR_DEPARTMENT_OTHER): Payer: Medicare Other

## 2017-08-03 ENCOUNTER — Ambulatory Visit
Admission: RE | Admit: 2017-08-03 | Discharge: 2017-08-03 | Disposition: A | Discharge status: Home / Self Care | Payer: BLUE CROSS/BLUE SHIELD | Source: Ambulatory Visit | Attending: Radiation Oncology | Admitting: Radiation Oncology

## 2017-08-03 ENCOUNTER — Other Ambulatory Visit (HOSPITAL_BASED_OUTPATIENT_CLINIC_OR_DEPARTMENT_OTHER): Payer: Medicare Other

## 2017-08-03 VITALS — BP 133/48 | HR 68 | Temp 97.9°F | Resp 18 | Ht 71.0 in | Wt 263.7 lb

## 2017-08-03 DIAGNOSIS — C163 Malignant neoplasm of pyloric antrum: Secondary | ICD-10-CM

## 2017-08-03 DIAGNOSIS — Z95828 Presence of other vascular implants and grafts: Secondary | ICD-10-CM | POA: Insufficient documentation

## 2017-08-03 DIAGNOSIS — Z510 Encounter for antineoplastic radiation therapy: Secondary | ICD-10-CM | POA: Diagnosis not present

## 2017-08-03 DIAGNOSIS — C169 Malignant neoplasm of stomach, unspecified: Secondary | ICD-10-CM

## 2017-08-03 DIAGNOSIS — C160 Malignant neoplasm of cardia: Secondary | ICD-10-CM

## 2017-08-03 DIAGNOSIS — R5383 Other fatigue: Secondary | ICD-10-CM

## 2017-08-03 DIAGNOSIS — E440 Moderate protein-calorie malnutrition: Secondary | ICD-10-CM

## 2017-08-03 DIAGNOSIS — D6481 Anemia due to antineoplastic chemotherapy: Secondary | ICD-10-CM | POA: Diagnosis not present

## 2017-08-03 DIAGNOSIS — Z452 Encounter for adjustment and management of vascular access device: Secondary | ICD-10-CM | POA: Diagnosis not present

## 2017-08-03 LAB — CBC & DIFF AND RETIC
BASO%: 0.3 % (ref 0.0–2.0)
Basophils Absolute: 0 10*3/uL (ref 0.0–0.1)
EOS%: 3.5 % (ref 0.0–7.0)
Eosinophils Absolute: 0.1 10*3/uL (ref 0.0–0.5)
HCT: 34 % — ABNORMAL LOW (ref 38.4–49.9)
HGB: 11.1 g/dL — ABNORMAL LOW (ref 13.0–17.1)
Immature Retic Fract: 3.4 % (ref 3.00–10.60)
LYMPH%: 7 % — ABNORMAL LOW (ref 14.0–49.0)
MCH: 27.2 pg (ref 27.2–33.4)
MCHC: 32.6 g/dL (ref 32.0–36.0)
MCV: 83.3 fL (ref 79.3–98.0)
MONO#: 0.5 10*3/uL (ref 0.1–0.9)
MONO%: 15 % — ABNORMAL HIGH (ref 0.0–14.0)
NEUT#: 2.3 10*3/uL (ref 1.5–6.5)
NEUT%: 74.2 % (ref 39.0–75.0)
Platelets: 103 10*3/uL — ABNORMAL LOW (ref 140–400)
RBC: 4.08 10*6/uL — ABNORMAL LOW (ref 4.20–5.82)
RDW: 18.7 % — ABNORMAL HIGH (ref 11.0–14.6)
Retic %: 1.33 % (ref 0.80–1.80)
Retic Ct Abs: 54.26 10*3/uL (ref 34.80–93.90)
WBC: 3.1 10*3/uL — ABNORMAL LOW (ref 4.0–10.3)
lymph#: 0.2 10*3/uL — ABNORMAL LOW (ref 0.9–3.3)
nRBC: 0 % (ref 0–0)

## 2017-08-03 LAB — COMPREHENSIVE METABOLIC PANEL
ALT: 11 U/L (ref 0–55)
AST: 21 U/L (ref 5–34)
Albumin: 3.4 g/dL — ABNORMAL LOW (ref 3.5–5.0)
Alkaline Phosphatase: 66 U/L (ref 40–150)
Anion Gap: 8 mEq/L (ref 3–11)
BUN: 7.7 mg/dL (ref 7.0–26.0)
CO2: 25 mEq/L (ref 22–29)
Calcium: 9.4 mg/dL (ref 8.4–10.4)
Chloride: 109 mEq/L (ref 98–109)
Creatinine: 0.8 mg/dL (ref 0.7–1.3)
EGFR: >90 mL/min/{1.73_m2} (ref >90)
Glucose: 132 mg/dl (ref 70–140)
Potassium: 3.7 mEq/L (ref 3.5–5.1)
Sodium: 141 mEq/L (ref 136–145)
Total Bilirubin: 0.63 mg/dL (ref 0.20–1.20)
Total Protein: 6.6 g/dL (ref 6.4–8.3)

## 2017-08-03 LAB — MAGNESIUM: Magnesium: 1.9 mg/dl (ref 1.5–2.5)

## 2017-08-03 MED ORDER — SODIUM CHLORIDE 0.9% FLUSH
10.0000 mL | INTRAVENOUS | Status: DC | PRN
  Administered 2017-08-03: 10 mL via INTRAVENOUS
  Filled 2017-08-03: qty 10

## 2017-08-03 MED ORDER — ONDANSETRON HCL 8 MG PO TABS: 8.0000 mg | ORAL_TABLET | Freq: Three times a day (TID) | ORAL | 1 refills | Status: DC | PRN

## 2017-08-03 MED ORDER — HEPARIN SOD (PORK) LOCK FLUSH 100 UNIT/ML IV SOLN
500.0000 [IU] | Freq: Once | INTRAVENOUS | Status: AC | PRN
  Administered 2017-08-03: 500 [IU] via INTRAVENOUS
  Filled 2017-08-03: qty 5

## 2017-08-03 NOTE — Progress Notes (Signed)
Pt wanted labs drawn through arm instead of porta cath. Porsche Cates LPN

## 2017-08-03 NOTE — Telephone Encounter (Signed)
Scheduled appt per 9/20 los - Sent reminder letter in the mail with the appt date and time.

## 2017-08-04 ENCOUNTER — Ambulatory Visit
Admission: RE | Admit: 2017-08-04 | Discharge: 2017-08-04 | Disposition: A | Discharge status: Home / Self Care | Payer: BLUE CROSS/BLUE SHIELD | Source: Ambulatory Visit | Attending: Radiation Oncology | Admitting: Radiation Oncology

## 2017-08-04 DIAGNOSIS — Z510 Encounter for antineoplastic radiation therapy: Secondary | ICD-10-CM | POA: Diagnosis not present

## 2017-08-07 ENCOUNTER — Ambulatory Visit
Admission: RE | Admit: 2017-08-07 | Discharge: 2017-08-07 | Disposition: A | Discharge status: Home / Self Care | Payer: BLUE CROSS/BLUE SHIELD | Source: Ambulatory Visit | Attending: Radiation Oncology | Admitting: Radiation Oncology

## 2017-08-07 DIAGNOSIS — Z510 Encounter for antineoplastic radiation therapy: Secondary | ICD-10-CM | POA: Diagnosis not present

## 2017-08-08 ENCOUNTER — Ambulatory Visit
Admission: RE | Admit: 2017-08-08 | Discharge: 2017-08-08 | Disposition: A | Discharge status: Home / Self Care | Payer: BLUE CROSS/BLUE SHIELD | Source: Ambulatory Visit | Attending: Radiation Oncology | Admitting: Radiation Oncology

## 2017-08-08 DIAGNOSIS — Z510 Encounter for antineoplastic radiation therapy: Secondary | ICD-10-CM | POA: Diagnosis not present

## 2017-08-09 ENCOUNTER — Ambulatory Visit
Admission: RE | Admit: 2017-08-09 | Discharge: 2017-08-09 | Disposition: A | Discharge status: Home / Self Care | Payer: BLUE CROSS/BLUE SHIELD | Source: Ambulatory Visit | Attending: Radiation Oncology | Admitting: Radiation Oncology

## 2017-08-09 DIAGNOSIS — Z510 Encounter for antineoplastic radiation therapy: Secondary | ICD-10-CM | POA: Diagnosis not present

## 2017-08-10 ENCOUNTER — Ambulatory Visit
Admission: RE | Admit: 2017-08-10 | Discharge: 2017-08-10 | Disposition: A | Discharge status: Home / Self Care | Payer: BLUE CROSS/BLUE SHIELD | Source: Ambulatory Visit | Attending: Radiation Oncology | Admitting: Radiation Oncology

## 2017-08-10 DIAGNOSIS — Z510 Encounter for antineoplastic radiation therapy: Secondary | ICD-10-CM | POA: Diagnosis not present

## 2017-08-11 ENCOUNTER — Ambulatory Visit
Admission: RE | Admit: 2017-08-11 | Discharge: 2017-08-11 | Disposition: A | Discharge status: Home / Self Care | Payer: BLUE CROSS/BLUE SHIELD | Source: Ambulatory Visit | Attending: Radiation Oncology | Admitting: Radiation Oncology

## 2017-08-11 DIAGNOSIS — Z510 Encounter for antineoplastic radiation therapy: Secondary | ICD-10-CM | POA: Diagnosis not present

## 2017-08-14 ENCOUNTER — Ambulatory Visit
Admission: RE | Admit: 2017-08-14 | Discharge: 2017-08-14 | Disposition: A | Discharge status: Home / Self Care | Payer: BLUE CROSS/BLUE SHIELD | Source: Ambulatory Visit | Attending: Radiation Oncology | Admitting: Radiation Oncology

## 2017-08-14 DIAGNOSIS — Z510 Encounter for antineoplastic radiation therapy: Secondary | ICD-10-CM | POA: Diagnosis not present

## 2017-08-15 ENCOUNTER — Ambulatory Visit
Admission: RE | Admit: 2017-08-15 | Discharge: 2017-08-15 | Disposition: A | Discharge status: Home / Self Care | Payer: BLUE CROSS/BLUE SHIELD | Source: Ambulatory Visit | Attending: Radiation Oncology | Admitting: Radiation Oncology

## 2017-08-15 DIAGNOSIS — Z510 Encounter for antineoplastic radiation therapy: Secondary | ICD-10-CM | POA: Diagnosis not present

## 2017-08-15 NOTE — Progress Notes (Signed)
HEMATOLOGY/ONCOLOGY CLINIC NOTE  Date of Service: 08/16/17   Patient Care Team: Steven Mccreedy, MD as PCP - General (Internal Medicine) [name] (Surgery)    CHIEF COMPLAINTS/PURPOSE OF CONSULTATION:   f/u for gastric Adenocarcinoma.  HISTORY OF PRESENTING ILLNESS:   [name] is a wonderful 66 y.o. male is here for continued evaluation and management of recently diagnosed pT4a, pN3b, pM1 Gastric Adenocarcinoma.  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 Patient had presented to primary care physician with dyspeptic symptoms, partial gastric outlet obstruction and weight loss of 60 pounds. Patient has had a distal gastrectomy, cholecystectomy with jejunal feeding tube placement by Dr. Barry Dienes on 01/10/2017 for gastric obstruction secondary to adenocarcinoma of the gastric pylorus. He was noted to have invaded into the pancreas and a bit of the anterior pancreatic head was resected en bloc with the tumor.  Pathology showed  " INVASIVE MODERATELY TO POORLY DIFFERENTIATED ADENOCARCINOMA, SPANNING 4 CM IN GREATEST DIMENSION. TUMOR INVADES THROUGH STOMACH WALL TO INVOLVE SEROSAL SURFACE.  LYMPH/VASCULAR INVASION IS IDENTIFIED.  MARGINS ARE NEGATIVE.- FIFTEEN OF THIRTY NINE LYMPH NODES POSITIVE FOR METASTATIC ADENOCARCINOMA (15/39). EXTRACAPSULAR EXTENSION IS IDENTIFIED. 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  Patient had a prolonged hospitalization post surgery  due to significant NG tube output. Needed epidural and PCA for pain control. It took a while for him to be able to tolerate progression of 2 feeding. He had an abdominal drain due to a small collection near his pancreatic head. Was treated with Augmentin for this. Patient was in the hospital from 01/10/2017 and  was eventually discharged on 01/27/2017.  Patient postponed several attempts at posthospitalization follow-up. He has been recovering at home and has been optimizing his tube feeding. Patient was recently followed up by Dr. Barry Dienes and has had a port placement on 02/23/2017 and preparation for possible chemotherapy.  He notes that his tube feeding is up to 40 mL per hour for 12 hours and has targeted is 16 ml per hour. He was recently admitted to [facility] in [place] for a C. difficile colitis that caused a severe diarrhea. he is currently on oral vancomycin has completed about 5 days of treatment and is also on probiotics.  CURRENT THERAPY:  Concurrent Chemoradiation with Xeloda. He started 07/18/17  INTERVAL HISTORY  [name] is here for f/u for monitoring treatment toxicities with his concurrent chemoradiation with Xeloda. He began radiation treatment on 07/18/17 and is scheduled to complete treatment next week.   Pt presents to the office today. He reports that he is doing well overall. He was scheduled to have a radiation treatment today before appt. today but reports the machine is not working. He will check back with radiation oncology after this appointment regarding the machine working. Pt is scheduled for appointment next Friday 08/25/2017 at the cancer center outpatient rehab center and it will be his first appointment. He expressed that he would like to get his flu shot and gets his flu shot every year.  He denies taking trazodone at this time.   On review of systems, pt reports numbness to the right pinky finger and right shoulder, low abdominal pain, decreased taste(metallic), denies pain, insomnia, fever, chills.    No nausea/vomiting/diarrhea. The patient currently sees Dr. Vista Lawman for primary care.   MEDICAL HISTORY:  Past Medical History:  Diagnosis Date  . Anemia   . Cancer [facility])    Gastric Adenocarcinoma  . Gastric outlet obstruction 01/2017  . GERD (gastroesophageal  reflux disease)   . Pre-diabetes    denies  . SVT (supraventricular tachycardia) ([place])    during Admission and Surgery- 01/2017    SURGICAL HISTORY: Past Surgical History:  Procedure Laterality Date  . CHOLECYSTECTOMY N/A 01/10/2017   Procedure: CHOLECYSTECTOMY;  Surgeon: Stark Klein, MD;  Location: [place];  Service: General;  Laterality: N/A;  . COLONOSCOPY    . COLONOSCOPY  10/11/2011   Procedure: COLONOSCOPY;  Surgeon: Daneil Dolin, MD;  [facility];  Service: Endoscopy;  Laterality: N/A;  10:30 AM  . ESOPHAGOGASTRODUODENOSCOPY N/A 12/30/2016   Procedure: ESOPHAGOGASTRODUODENOSCOPY (EGD);  Surgeon: Carol Ada, MD;  Location: [facility];  Service: Endoscopy;  Laterality: N/A;  . GASTRECTOMY N/A 01/10/2017   Procedure: DISTAL GASTRECTOMY, JEJUNUM  FEEDING TUBE PLACEMENT;  Surgeon: Stark Klein, MD;  Location: [place];  Service: General;  Laterality: N/A;  . IR GENERIC HISTORICAL  01/24/2017   IR [place] DUODEN/JEJUNO TUBE PERCUT W/FLUORO 01/24/2017 Aletta Edouard, MD [facility]-INTERV RAD  . IR REPLC DUODEN/JEJUNO TUBE PERCUT W/FLUORO  03/26/2017  . PORTACATH PLACEMENT N/A 02/23/2017   Procedure: INSERTION PORT-A-CATH;  Surgeon: Stark Klein, MD;  Location: [place];  Service: General;  Laterality: N/A;  . testicle removed  1982    SOCIAL HISTORY: Social History   Social History  . Marital status: Married    Spouse name: N/A  . Number of children: N/A  . Years of education: N/A   Occupational History  . Not on file.   Social History Main Topics  . Smoking status: Never Smoker  . Smokeless tobacco: Never Used  . Alcohol use No     Comment: occ beer or wine but none in last 6 months   . Drug use: No  . Sexual activity: Not on file   Other Topics Concern  . Not on file   Social History Narrative  . No narrative on file    FAMILY HISTORY: Family History  Problem Relation Age of Onset  . Prostate cancer Father   . Colon cancer Neg Hx     ALLERGIES:  is allergic to  no known allergies.  MEDICATIONS:  Current Outpatient Prescriptions  Medication Sig Dispense Refill  . dexamethasone (DECADRON) 4 MG tablet Take 2 tablets (8 mg total) by mouth daily. Start the day after chemotherapy for 2 days. Take with food. 30 tablet 1  . famotidine (PEPCID) 20 MG tablet Take 20 mg by mouth daily.     . ferrous sulfate 325 (65 FE) MG tablet Take 325 mg by mouth daily with breakfast.    . HYDROcodone-acetaminophen (NORCO/VICODIN) 5-325 MG tablet Take 1-2 tablets by mouth every 6 (six) hours as needed for moderate pain. 30 tablet 0  . lidocaine-prilocaine (EMLA) cream Apply to affected area once 30 g 3  . Multiple Vitamin (MULTIVITAMIN) tablet Take 1 tablet by mouth daily.    . ondansetron (ZOFRAN) 8 MG tablet Take 1 tablet (8 mg total) by mouth every 8 (eight) hours as needed for refractory nausea / vomiting. Start on day 3 after chemotherapy. 30 tablet 1  . prochlorperazine (COMPAZINE) 10 MG tablet Take 1 tablet (10 mg total) by mouth every 6 (six) hours as needed (Nausea or vomiting). 30 tablet 1  . traZODone (DESYREL) 50 MG tablet TAKE 1 TABLET (50 MG TOTAL) BY MOUTH AT BEDTIME AS NEEDED FOR  SLEEP. 30 tablet 0   No current facility-administered medications for this visit.     REVIEW OF SYSTEMS:    10 Point review of Systems was done is negative except as noted above.  PHYSICAL EXAMINATION: ECOG PERFORMANCE STATUS: 2 - Symptomatic, <50% confined to bed  . There were no vitals filed for this visit. There were no vitals filed for this visit. .There is no height or weight on file to calculate BMI.   GENERAL:alert, in no acute distress and comfortable SKIN: no acute rashes, no significant lesions EYES: conjunctiva are pink and non-injected, sclera anicteric OROPHARYNX: MMM, no exudates, no oropharyngeal erythema or ulceration NECK: supple, no JVD LYMPH:  no palpable lymphadenopathy in the cervical, axillary or inguinal regions LUNGS: clear to auscultation b/l  with normal respiratory effort HEART: regular rate & rhythm ABDOMEN:  normoactive bowel sounds , non tender, not distended. Feeding tube site healing. Extremity: no pedal edema PSYCH: alert & oriented x 3 with fluent speech NEURO: no focal motor/sensory deficits  LABORATORY DATA:  I have reviewed the data as listed  . CBC Latest Ref Rng & Units 08/16/2017 08/03/2017 07/20/2017  WBC 4.0 - 10.3 10e3/uL 2.8(L) 3.1(L) 4.0  Hemoglobin 13.0 - 17.1 g/dL 12.0(L) 11.1(L) 11.6(L)  Hematocrit 38.4 - 49.9 % 36.5(L) 34.0(L) 36.2(L)  Platelets 140 - 400 10e3/uL 103(L) 103(L) 171   ANC 2.3k . CMP Latest Ref Rng & Units 08/16/2017 08/03/2017 07/20/2017  Glucose 70 - 140 mg/dl 159(H) 132 135  BUN 7.0 - 26.0 mg/dL 10.2 7.7 9.8  Creatinine 0.7 - 1.3 mg/dL 0.9 0.8 0.9  Sodium 136 - 145 mEq/L 141 141 142  Potassium 3.5 - 5.1 mEq/L 4.3 3.7 3.8  Chloride 101 - 111 mmol/L - - -  CO2 22 - 29 mEq/L 24 25 24   Calcium 8.4 - 10.4 mg/dL 9.9 9.4 9.7  Total Protein 6.4 - 8.3 g/dL 7.2 6.6 6.9  Total Bilirubin 0.20 - 1.20 mg/dL 0.82 0.63 0.69  Alkaline Phos 40 - 150 U/L 82 66 69  AST 5 - 34 U/L 27 21 18   ALT 0 - 55 U/L 17 11 10       RADIOGRAPHIC STUDIES: I have personally reviewed the radiological images as listed and agreed with the findings in the report. No results found.  ASSESSMENT & PLAN:   66 year old African-American male with  1) Resected Stage IV pT4a, pN3b, pM1 invasive moderately-poorly Gastric Adenocarcinoma . Her 2 neg by FISH LVI + margins neg 15/39 LN +ve, Extracapsular invasion +, some involvement of pancreatic capsule there pM1 Presented with severe pyloric stenosis with ulceration. S/p 60 pound weight loss- now resolved. S/p distal gastrectomy, cholecystectomy and en bloc dissection off the small part of the pancreatic head  with jejunal feeding tube placement. Initial CT abdomen and pelvis did not show any local regional lymphadenopathy. Rpt CT chest/abd/pelvis from 03/22/2017 - no overt  evidence of residual disease or metastatic disease. Post-surgical peripancreatic fluid collection resolving. CT chest/abd/pelvis 06/21/2017 after 6 cycles of FOLFOX -  No evidence of residual or metastatic carcinoma within the chest, abdomen, or pelvis -Started Chemoradiation on 07/18/17 with Xeloda (825mg /m2 twice daily Monday through Friday while on radiation), so far tolerating well.   #2 s/p moderate protein calorie malnutrition patient had lost about 60 pounds in the last 2-3 months but has been gaining back his lost weight well.  . 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  #4 status post port placement on 02/23/2017  -Port flush today and then every 6 weeks. -If he has NED status after completion of concurrent chemoradiation will consider port removal.  #5 recent Clostridium difficile colitis -. No overt issues with diarrhea currently. Has completed his prolonged po vancomycin course.   #6 suspected sleep apnea.Has not had a sleep study.Wife notes significant snoring which is important with weight loss. Will have to be careful with excessive sedation. -continue prn low dose trazodone for insomnia -recommended he f/u with his PCP for consideration of sleep study-pending   Plan -Patient is on concurrent chemoradiation with Xeloda (825mg /m2 twice daily Monday through Friday while on radiation) started 07/18/17 and will end 08/24/17 -Labs reviewed. -Patient has no prohibitive toxicities from treatment at this time. -f/u with outpatient cancer rehab for fatigue and endurance as scheduled.  -Repeat CT scan 4-6 weeks after he finishes treatment  #6 suspected sleep apnea.Has not had a sleep study.Wife notes significant snoring which is important with weight loss. Will have to be careful  with excessive sedation. -continue prn low dose trazodone for insomnia -previously recommended he f/u with his PCP for consideration of sleep study-pending  RTC with Dr Irene Limbo in 3 weeks with labs   All of the patients questions were answered with apparent satisfaction. The patient knows to call the clinic with any problems, questions or concerns.  I spent 20 minutes counseling the patient face to face. The total time spent in the appointment was 25 minutes and more than 50% was on counseling and direct patient cares.   All of the patients questions were answered with apparent satisfaction. The patient knows to call the clinic with any problems, questions or concerns.    Sullivan Lone MD [place] AAHIVMS [facility] Hematology/Oncology Physician [facility]  (Office):       [phone] (Work cell):  [phone] (Fax):           [phone]   This document serves as a record of services personally performed by Sullivan Lone, MD. It was created on her behalf by Alean Rinne, a trained medical scribe. The creation of this record is based on the scribe's personal observations and the provider's statements to them. This document has been checked and approved by the attending provider.

## 2017-08-16 ENCOUNTER — Telehealth: Payer: Self-pay | Admitting: Hematology

## 2017-08-16 ENCOUNTER — Other Ambulatory Visit (HOSPITAL_BASED_OUTPATIENT_CLINIC_OR_DEPARTMENT_OTHER): Payer: Medicare Other

## 2017-08-16 ENCOUNTER — Ambulatory Visit: Admission: RE | Admit: 2017-08-16 | Payer: BLUE CROSS/BLUE SHIELD | Source: Ambulatory Visit

## 2017-08-16 ENCOUNTER — Ambulatory Visit (HOSPITAL_BASED_OUTPATIENT_CLINIC_OR_DEPARTMENT_OTHER): Payer: Medicare Other | Admitting: Hematology

## 2017-08-16 ENCOUNTER — Encounter: Payer: Self-pay | Admitting: Hematology

## 2017-08-16 VITALS — BP 121/57 | HR 74 | Temp 97.6°F | Resp 18 | Ht 71.0 in | Wt 256.3 lb

## 2017-08-16 DIAGNOSIS — D6481 Anemia due to antineoplastic chemotherapy: Secondary | ICD-10-CM | POA: Diagnosis not present

## 2017-08-16 DIAGNOSIS — C169 Malignant neoplasm of stomach, unspecified: Secondary | ICD-10-CM

## 2017-08-16 DIAGNOSIS — E440 Moderate protein-calorie malnutrition: Secondary | ICD-10-CM | POA: Diagnosis not present

## 2017-08-16 DIAGNOSIS — C163 Malignant neoplasm of pyloric antrum: Secondary | ICD-10-CM

## 2017-08-16 LAB — COMPREHENSIVE METABOLIC PANEL
ALT: 17 U/L (ref 0–55)
AST: 27 U/L (ref 5–34)
Albumin: 3.6 g/dL (ref 3.5–5.0)
Alkaline Phosphatase: 82 U/L (ref 40–150)
Anion Gap: 9 mEq/L (ref 3–11)
BUN: 10.2 mg/dL (ref 7.0–26.0)
CO2: 24 mEq/L (ref 22–29)
Calcium: 9.9 mg/dL (ref 8.4–10.4)
Chloride: 107 mEq/L (ref 98–109)
Creatinine: 0.9 mg/dL (ref 0.7–1.3)
EGFR: >90 mL/min/{1.73_m2} (ref >90)
Glucose: 159 mg/dl — ABNORMAL HIGH (ref 70–140)
Potassium: 4.3 mEq/L (ref 3.5–5.1)
Sodium: 141 mEq/L (ref 136–145)
Total Bilirubin: 0.82 mg/dL (ref 0.20–1.20)
Total Protein: 7.2 g/dL (ref 6.4–8.3)

## 2017-08-16 LAB — CBC & DIFF AND RETIC
BASO%: 0.7 % (ref 0.0–2.0)
Basophils Absolute: 0 10*3/uL (ref 0.0–0.1)
EOS%: 3.2 % (ref 0.0–7.0)
Eosinophils Absolute: 0.1 10*3/uL (ref 0.0–0.5)
HCT: 36.5 % — ABNORMAL LOW (ref 38.4–49.9)
HGB: 12 g/dL — ABNORMAL LOW (ref 13.0–17.1)
Immature Retic Fract: 6.8 % (ref 3.00–10.60)
LYMPH%: 4.8 % — ABNORMAL LOW (ref 14.0–49.0)
MCH: 27.7 pg (ref 27.2–33.4)
MCHC: 32.9 g/dL (ref 32.0–36.0)
MCV: 84.1 fL (ref 79.3–98.0)
MONO#: 0.4 10*3/uL (ref 0.1–0.9)
MONO%: 13 % (ref 0.0–14.0)
NEUT#: 2.2 10*3/uL (ref 1.5–6.5)
NEUT%: 78.3 % — ABNORMAL HIGH (ref 39.0–75.0)
Platelets: 103 10*3/uL — ABNORMAL LOW (ref 140–400)
RBC: 4.33 10*6/uL (ref 4.20–5.82)
RDW: 21.3 % — ABNORMAL HIGH (ref 11.0–14.6)
Retic %: 1.57 % (ref 0.80–1.80)
Retic Ct Abs: 67.98 10*3/uL (ref 34.80–93.90)
WBC: 2.8 10*3/uL — ABNORMAL LOW (ref 4.0–10.3)
lymph#: 0.1 10*3/uL — ABNORMAL LOW (ref 0.9–3.3)

## 2017-08-16 LAB — MAGNESIUM: Magnesium: 2.1 mg/dl (ref 1.5–2.5)

## 2017-08-16 NOTE — Patient Instructions (Signed)
Thank you for choosing [facility] to provide your oncology and hematology care.  To afford each patient quality time with our providers, please arrive 30 minutes before your scheduled appointment time.  If you arrive late for your appointment, you may be asked to reschedule.  We strive to give you quality time with our providers, and arriving late affects you and other patients whose appointments are after yours.   If you are a no show for multiple scheduled visits, you may be dismissed from the clinic at the providers discretion.    Again, thank you for choosing [facility], our hope is that these requests will decrease the amount of time that you wait before being seen by our physicians.  ______________________________________________________________________  Should you have questions after your visit to the [facility], please contact our office at [phone] between the hours of 8:30 and 4:30 p.m.    Voicemails left after 4:30p.m will not be returned until the following business day.    For prescription refill requests, please have your pharmacy contact us directly.  Please also try to allow 48 hours for prescription requests.    Please contact the scheduling department for questions regarding scheduling.  For scheduling of procedures such as PET scans, CT scans, MRI, Ultrasound, etc please contact central scheduling at [phone].    Resources For Cancer Patients and Caregivers:   Oncolink.org:  A wonderful resource for patients and healthcare providers for information regarding your disease, ways to tract your treatment, what to expect, etc.     American Cancer Society:  800-227-2345  Can help patients locate various types of support and financial assistance  Cancer Care: 1-800-813-HOPE (4673) Provides financial assistance, online support groups, medication/co-pay assistance.    [place] DSS:  336-641-3447 Where to apply for food  stamps, Medicaid, and utility assistance  Medicare Rights Center: 800-333-4114 Helps people with Medicare understand their rights and benefits, navigate the Medicare system, and secure the quality healthcare they deserve  SCAT: 336-333-6589 [place] [employer]'s shared-ride transportation service for eligible riders who have a disability that prevents them from riding the fixed route bus.    For additional information on assistance programs please contact our social worker:   Grier Hock/Abigail [place]:  [phone]            

## 2017-08-16 NOTE — Telephone Encounter (Signed)
Scheduled appt per 10/3 los - patient is aware of appt date and time.  

## 2017-08-17 ENCOUNTER — Ambulatory Visit
Admission: RE | Admit: 2017-08-17 | Discharge: 2017-08-17 | Disposition: A | Discharge status: Home / Self Care | Payer: BLUE CROSS/BLUE SHIELD | Source: Ambulatory Visit | Attending: Radiation Oncology | Admitting: Radiation Oncology

## 2017-08-17 DIAGNOSIS — Z510 Encounter for antineoplastic radiation therapy: Secondary | ICD-10-CM | POA: Diagnosis not present

## 2017-08-18 ENCOUNTER — Ambulatory Visit
Admission: RE | Admit: 2017-08-18 | Discharge: 2017-08-18 | Disposition: A | Discharge status: Home / Self Care | Payer: BLUE CROSS/BLUE SHIELD | Source: Ambulatory Visit | Attending: Radiation Oncology | Admitting: Radiation Oncology

## 2017-08-18 DIAGNOSIS — Z510 Encounter for antineoplastic radiation therapy: Secondary | ICD-10-CM | POA: Diagnosis not present

## 2017-08-20 ENCOUNTER — Ambulatory Visit: Payer: BLUE CROSS/BLUE SHIELD

## 2017-08-21 ENCOUNTER — Ambulatory Visit
Admission: RE | Admit: 2017-08-21 | Discharge: 2017-08-21 | Disposition: A | Discharge status: Home / Self Care | Payer: BLUE CROSS/BLUE SHIELD | Source: Ambulatory Visit | Attending: Radiation Oncology | Admitting: Radiation Oncology

## 2017-08-21 DIAGNOSIS — Z510 Encounter for antineoplastic radiation therapy: Secondary | ICD-10-CM | POA: Diagnosis not present

## 2017-08-22 ENCOUNTER — Ambulatory Visit
Admission: RE | Admit: 2017-08-22 | Discharge: 2017-08-22 | Disposition: A | Discharge status: Home / Self Care | Payer: BLUE CROSS/BLUE SHIELD | Source: Ambulatory Visit | Attending: Radiation Oncology | Admitting: Radiation Oncology

## 2017-08-22 ENCOUNTER — Ambulatory Visit: Payer: BLUE CROSS/BLUE SHIELD

## 2017-08-22 DIAGNOSIS — Z510 Encounter for antineoplastic radiation therapy: Secondary | ICD-10-CM | POA: Diagnosis not present

## 2017-08-23 ENCOUNTER — Ambulatory Visit
Admission: RE | Admit: 2017-08-23 | Discharge: 2017-08-23 | Disposition: A | Discharge status: Home / Self Care | Payer: BLUE CROSS/BLUE SHIELD | Source: Ambulatory Visit | Attending: Radiation Oncology | Admitting: Radiation Oncology

## 2017-08-23 ENCOUNTER — Ambulatory Visit: Payer: BLUE CROSS/BLUE SHIELD

## 2017-08-23 DIAGNOSIS — Z510 Encounter for antineoplastic radiation therapy: Secondary | ICD-10-CM | POA: Diagnosis not present

## 2017-08-24 ENCOUNTER — Ambulatory Visit: Payer: BLUE CROSS/BLUE SHIELD

## 2017-08-24 ENCOUNTER — Ambulatory Visit
Admission: RE | Admit: 2017-08-24 | Discharge: 2017-08-24 | Disposition: A | Discharge status: Home / Self Care | Payer: BLUE CROSS/BLUE SHIELD | Source: Ambulatory Visit | Attending: Radiation Oncology | Admitting: Radiation Oncology

## 2017-08-24 DIAGNOSIS — Z510 Encounter for antineoplastic radiation therapy: Secondary | ICD-10-CM | POA: Diagnosis not present

## 2017-08-25 ENCOUNTER — Ambulatory Visit: Payer: BLUE CROSS/BLUE SHIELD

## 2017-08-25 ENCOUNTER — Ambulatory Visit
Admission: RE | Admit: 2017-08-25 | Discharge: 2017-08-25 | Disposition: A | Discharge status: Home / Self Care | Payer: BLUE CROSS/BLUE SHIELD | Source: Ambulatory Visit | Attending: Radiation Oncology | Admitting: Radiation Oncology

## 2017-08-25 ENCOUNTER — Ambulatory Visit: Payer: Medicare Other | Attending: Hematology | Admitting: Physical Therapy

## 2017-08-25 ENCOUNTER — Encounter: Payer: Self-pay | Admitting: Radiation Oncology

## 2017-08-25 DIAGNOSIS — M25611 Stiffness of right shoulder, not elsewhere classified: Secondary | ICD-10-CM | POA: Insufficient documentation

## 2017-08-25 DIAGNOSIS — M6281 Muscle weakness (generalized): Secondary | ICD-10-CM | POA: Diagnosis not present

## 2017-08-25 DIAGNOSIS — Z510 Encounter for antineoplastic radiation therapy: Secondary | ICD-10-CM | POA: Diagnosis not present

## 2017-08-25 DIAGNOSIS — R2689 Other abnormalities of gait and mobility: Secondary | ICD-10-CM | POA: Diagnosis not present

## 2017-08-25 NOTE — Therapy (Signed)
[place], [place], [address] Phone: [phone]   Fax:  [phone]  Physical Therapy Evaluation  Patient Details  Name: [name] MRN: [id number] Date of Birth: Nov 25, 1950 Referring Provider: Dr. Sullivan Lone  Encounter Date: 08/25/2017      PT End of Session - 08/25/17 1308    Visit Number 1   Number of Visits 9   Date for PT Re-Evaluation 10/04/17   PT Start Time 1115   PT Stop Time 1207   PT Time Calculation (min) 52 min   Activity Tolerance Patient tolerated treatment well   Behavior During Therapy [facility] for tasks assessed/performed      Past Medical History:  Diagnosis Date  . Anemia   . Cancer [facility])    Gastric Adenocarcinoma  . Gastric outlet obstruction 01/2017  . GERD (gastroesophageal reflux disease)   . Pre-diabetes    denies  . SVT (supraventricular tachycardia) ([place])    during Admission and Surgery- 01/2017    Past Surgical History:  Procedure Laterality Date  . CHOLECYSTECTOMY N/A 01/10/2017   Procedure: CHOLECYSTECTOMY;  Surgeon: Stark Klein, MD;  Location: [place];  Service: General;  Laterality: N/A;  . COLONOSCOPY    . COLONOSCOPY  10/11/2011   Procedure: COLONOSCOPY;  Surgeon: Daneil Dolin, MD;  [facility];  Service: Endoscopy;  Laterality: N/A;  10:30 AM  . ESOPHAGOGASTRODUODENOSCOPY N/A 12/30/2016   Procedure: ESOPHAGOGASTRODUODENOSCOPY (EGD);  Surgeon: Carol Ada, MD;  [facility];  Service: Endoscopy;  Laterality: N/A;  . GASTRECTOMY N/A 01/10/2017   Procedure: DISTAL GASTRECTOMY, JEJUNUM  FEEDING TUBE PLACEMENT;  Surgeon: Stark Klein, MD;  Location: [place];  Service: General;  Laterality: N/A;  . IR GENERIC HISTORICAL  01/24/2017   IR [place] DUODEN/JEJUNO TUBE PERCUT W/FLUORO 01/24/2017 Aletta Edouard, MD [facility]-INTERV RAD  . IR REPLC DUODEN/JEJUNO TUBE PERCUT W/FLUORO  03/26/2017  . PORTACATH PLACEMENT N/A 02/23/2017   Procedure: INSERTION  PORT-A-CATH;  Surgeon: Stark Klein, MD;  Location: [place];  Service: General;  Laterality: N/A;  . testicle removed  1982    There were no vitals filed for this visit.       Subjective Assessment - 08/25/17 1121    Subjective "Here for physical therapy to build my strength back up. I had my last day of radiation this morning. I've lost 75-80 lbs. since I got sick."   Pertinent History pT4a, pN3b, pM1 gastric Adenocarcinoma with surgical resection 01/10/17; was hospitalized 10 days soon thereafter for CDif.  Chemo started mid-May x 3 months, followed by radiation, started early September and completed today (08/25/17). No other treatment planned but will do follow-ups with surgeon and Dr. Irene Limbo.  Is to have Portacath removed, and is to have a follow-up scan in 3 weeks. No other health issues.   Patient Stated Goals build my strength back up; likes to travel   Currently in Pain? Yes   Pain Score 2    Pain Location Abdomen  stomach   Pain Descriptors / Indicators --  annoying   Aggravating Factors  when he eats, immediately after he eats   Pain Relieving Factors gradually goes away after about an hour            [facility] PT Assessment - 08/25/17 0001      Assessment   Medical Diagnosis gastric adenocarcinoma   Referring Provider Dr. Sullivan Lone   Onset Date/Surgical Date 01/10/17   Hand Dominance Right   Prior Therapy none  Precautions   Precautions Other (comment)   Precaution Comments cancer precautions     Restrictions   Weight Bearing Restrictions No     Balance Screen   Has the patient fallen in the past 6 months No   Has the patient had a decrease in activity level because of a fear of falling?  No   Is the patient reluctant to leave their home because of a fear of falling?  No     Home Environment   Living Environment Private residence   Living Arrangements Spouse/significant other   Type of [place]  farm in [place] and house in [facility] Two  level;Able to live on main level with bedroom/bathroom     Prior Function   Level of Independence Independent   Vocation Other (comment)  not working currently; may go back part time   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  Did 8 repetitions in 30 seconds; denied dyspnea nor leg fatigue, but felt warm following that.     Posture/Postural Control   Posture/Postural Control Postural limitations   Postural Limitations Flexed trunk     ROM / Strength   AROM / PROM / Strength AROM     AROM   Overall AROM Comments LEs grossly WFL but with slightly limited active knee extension; looks like he has some arthritis with mild limitations in motion   AROM Assessment Site Shoulder   Right/Left Shoulder Right;Left   Right Shoulder Flexion 100 Degrees   Right Shoulder ABduction 98 Degrees   Right Shoulder Internal Rotation 50 Degrees  in supine, active-assistive   Right Shoulder External Rotation 30 Degrees   Left Shoulder Flexion 125 Degrees   Left Shoulder ABduction 129 Degrees   Left Shoulder Internal Rotation --  [facility]   Left Shoulder External Rotation 65 Degrees     Strength   Overall Strength Comments Grip strength with dynamometer on third setting:  right 75, 60, 65 lbs.; left 80, 80, 74  very slightly below normal for left, somewhat below for Rt.     Transfers   Comments stands from sitting with effort and uses arms quite a bit; sits from  standing carefully     Ambulation/Gait   Ambulation/Gait Yes   Ambulation/Gait Assistance 7: Independent   Gait velocity 4 seconds for 5 meters = 1.25 meters/second   Gait Comments fairly normal gait except for being slightly forward flexed     Standardized Balance Assessment   Standardized Balance Assessment Timed Up and Go Test     Timed Up and Go Test   Normal TUG (seconds) 10     Functional Gait  Assessment   Gait assessed  Yes            Objective measurements completed on examination: See above findings.  PT Education - 08/25/17 1315    Education provided Yes   Education Details about [employer] at the [employer]) Educated Patient   Methods Explanation;Handout   Comprehension Verbalized understanding                [place] - 08/25/17 1318      CC Long Term Goal  #1   Title Pt. will be independent in a home exercise program for right shoulder stiffness, strengthening, and endurance.   Time 4   Period Weeks   Status New     CC Long Term Goal  #2   Title Pt. will be knowledgeable about appropriate exercise to transition to at his gym, [employer].   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   Time 4   Period Weeks   Status New             Plan - 08/25/17 1308    Clinical Impression Statement This is a very pleasant gentleman who has just completed treatment for gastric adenocarcinoma.  He is referred for deconditioning.  He has had limited ROM of right shoulder since his tumor resection surgery in February, per his report.  He is unable to stand from standard mat height without using UEs to push off, but can do this from 19 inch height; he did 8 reps in 30 seconds, below average of 12 for his age.  TUG score was 10; he does not appear to be at a significant fall risk. Grip strength is below  average on right hand. 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   History and Personal Factors relevant to plan of care: no other significant health issues   Clinical Presentation Evolving   Clinical Presentation due to: just completed chemo and then radiation   Clinical Decision Making Low   Rehab Potential Good   PT Frequency 2x / week   PT Duration 4 weeks   PT Treatment/Interventions ADLs/Self Care Home Management;Therapeutic exercise;Balance training;Patient/family education;Manual techniques;Passive range of motion   PT Next Visit Plan Do baseline 6-minute walk test. Begin ROM for right shoulder and HEP for same. Begin endurance exercise. Begin talking about what he can plan to work on at [employer], where he is a member.  He would like to work toward transition to that gym.   Consulted and Agree with Plan of Care Patient      Patient will benefit from skilled therapeutic intervention in order to improve the following deficits and impairments:  Decreased range of motion, Impaired UE functional use, Decreased strength, Decreased mobility, Decreased activity tolerance  Visit Diagnosis: Stiffness of right shoulder, not elsewhere classified - Plan: PT plan of care cert/re-cert  Muscle weakness (generalized) - Plan: PT plan of care cert/re-cert  Other abnormalities of gait and mobility - Plan: PT plan of care cert/re-cert      G-Codes - 24/40/10 1321    Functional Assessment Tool Used (Outpatient Only) clinical judgement   Functional Limitation Mobility: Walking and moving around   Mobility: Walking and Moving Around Current Status (U7253) At least 20 percent but less than 40 percent impaired, limited or restricted   Mobility: Walking and Moving Around Goal Status (G6440) At least 1 percent but less than 20 percent impaired, limited or  restricted       Problem List Patient Active Problem List   Diagnosis Date Noted  . Port catheter in place 08/03/2017  .  Encounter for feeding tube placement 03/25/2017  . GERD (gastroesophageal reflux disease) 03/25/2017  . Anemia 03/25/2017  . Encounter for care related to feeding tube 03/25/2017  . History of Clostridium difficile colitis 03/25/2017  . Counseling regarding advanced directives and goals of care 03/17/2017  . Counseling regarding advanced care planning and goals of care 03/15/2017  . NSVT (nonsustained ventricular tachycardia) ([place])   . Elevated troponin I level   . Protein-calorie malnutrition, severe 01/12/2017  . Malignant neoplasm of cardia of stomach ([place]) 01/10/2017    SALISBUR[place],DONNA 08/25/2017, 1:24 PM  [place], [place], [address] Phone: [phone]   Fax:  [phone]  Name: [name] MRN: [id number] Date of Birth: 1951/04/23  Serafina Royals, PT 08/25/17 1:24 PM

## 2017-08-29 NOTE — Progress Notes (Signed)
  Radiation Oncology         [phone]) [phone] ________________________________  Name: [name] MRN: [id number]  Date: 08/25/2017  DOB: 1951-04-16  End of Treatment Note  Diagnosis:   66 y.o. male with Stage IV pT4a, pN3b, pM1 invasive moderately-poorly Gastric Adenocarcinoma  Indication for treatment:  Curative     Radiation treatment dates:   07/18/2017 - 08/25/2017  Site/dose:   The stomach was treated initially to 45 Gy in 25 fractions, followed by a 5.4 Gy boost to yield a final total dose of 50.4 Gy.  Beams/energy:   3D // 6X Photon  Narrative: The patient tolerated radiation treatment relatively well.   His primary side effects included mild fatigue and nausea. He took nausea medication for relief. He denied any vomiting, diarrhea, or abdominal pain. He experienced a 7-pound unintended weight loss, likely due to decreased appetite. This improved by the end of his treatment. He denied any skin issues except for some mild erythema to the treatment site.  Plan: The patient has completed radiation treatment. The patient will return to radiation oncology clinic for routine followup in one month. I advised them to call or return sooner if they have any questions or concerns related to their recovery or treatment.  ------------------------------------------------  Jodelle Gross, MD, PhD  This document serves as a record of services personally performed by Kyung Rudd, MD. It was created on his behalf by Rae Lips, a trained medical scribe. The creation of this record is based on the scribe's personal observations and the provider's statements to them. This document has been checked and approved by the attending provider.

## 2017-08-31 ENCOUNTER — Encounter: Payer: Self-pay | Admitting: Physical Therapy

## 2017-08-31 ENCOUNTER — Ambulatory Visit (HOSPITAL_BASED_OUTPATIENT_CLINIC_OR_DEPARTMENT_OTHER): Payer: Medicare Other

## 2017-08-31 ENCOUNTER — Ambulatory Visit: Payer: Medicare Other | Admitting: Physical Therapy

## 2017-08-31 DIAGNOSIS — M6281 Muscle weakness (generalized): Secondary | ICD-10-CM | POA: Diagnosis not present

## 2017-08-31 DIAGNOSIS — M25611 Stiffness of right shoulder, not elsewhere classified: Secondary | ICD-10-CM

## 2017-08-31 DIAGNOSIS — Z452 Encounter for adjustment and management of vascular access device: Secondary | ICD-10-CM

## 2017-08-31 DIAGNOSIS — C160 Malignant neoplasm of cardia: Secondary | ICD-10-CM

## 2017-08-31 DIAGNOSIS — R2689 Other abnormalities of gait and mobility: Secondary | ICD-10-CM

## 2017-08-31 DIAGNOSIS — Z95828 Presence of other vascular implants and grafts: Secondary | ICD-10-CM

## 2017-08-31 MED ORDER — HEPARIN SOD (PORK) LOCK FLUSH 100 UNIT/ML IV SOLN
500.0000 [IU] | Freq: Once | INTRAVENOUS | Status: AC | PRN
  Administered 2017-08-31: 500 [IU] via INTRAVENOUS
  Filled 2017-08-31: qty 5

## 2017-08-31 MED ORDER — SODIUM CHLORIDE 0.9% FLUSH
10.0000 mL | INTRAVENOUS | Status: DC | PRN
  Administered 2017-08-31: 10 mL via INTRAVENOUS
  Filled 2017-08-31: qty 10

## 2017-08-31 NOTE — Therapy (Signed)
[place], [place], [address] Phone: [phone]   Fax:  [phone]  Physical Therapy Treatment  Patient Details  Name: [name] MRN: [id number] Date of Birth: Mar 08, 1951 Referring Provider: Dr. Sullivan Lone  Encounter Date: 08/31/2017      PT End of Session - 08/31/17 1704    Visit Number 2   Number of Visits 9   Date for PT Re-Evaluation 10/04/17   PT Start Time 2778   PT Stop Time 1519   PT Time Calculation (min) 48 min   Activity Tolerance Patient tolerated treatment well   Behavior During Therapy [facility] for tasks assessed/performed      Past Medical History:  Diagnosis Date  . Anemia   . Cancer [facility])    Gastric Adenocarcinoma  . Gastric outlet obstruction 01/2017  . GERD (gastroesophageal reflux disease)   . Pre-diabetes    denies  . SVT (supraventricular tachycardia) ([place])    during Admission and Surgery- 01/2017    Past Surgical History:  Procedure Laterality Date  . CHOLECYSTECTOMY N/A 01/10/2017   Procedure: CHOLECYSTECTOMY;  Surgeon: Stark Klein, MD;  Location: [place];  Service: General;  Laterality: N/A;  . COLONOSCOPY    . COLONOSCOPY  10/11/2011   Procedure: COLONOSCOPY;  Surgeon: Daneil Dolin, MD;  [facility];  Service: Endoscopy;  Laterality: N/A;  10:30 AM  . ESOPHAGOGASTRODUODENOSCOPY N/A 12/30/2016   Procedure: ESOPHAGOGASTRODUODENOSCOPY (EGD);  Surgeon: Carol Ada, MD;  [facility];  Service: Endoscopy;  Laterality: N/A;  . GASTRECTOMY N/A 01/10/2017   Procedure: DISTAL GASTRECTOMY, JEJUNUM  FEEDING TUBE PLACEMENT;  Surgeon: Stark Klein, MD;  Location: [place];  Service: General;  Laterality: N/A;  . IR GENERIC HISTORICAL  01/24/2017   IR [place] DUODEN/JEJUNO TUBE PERCUT W/FLUORO 01/24/2017 Aletta Edouard, MD [facility]-INTERV RAD  . IR REPLC DUODEN/JEJUNO TUBE PERCUT W/FLUORO  03/26/2017  . PORTACATH PLACEMENT N/A 02/23/2017   Procedure: INSERTION  PORT-A-CATH;  Surgeon: Stark Klein, MD;  Location: [place];  Service: General;  Laterality: N/A;  . testicle removed  1982    There were no vitals filed for this visit.      Subjective Assessment - 08/31/17 1432    Subjective I am feeling very tired today. 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 I finished my stomach radiation last Friday. My diet is out of whack because things dont taste good.    Pertinent History pT4a, pN3b, pM1 gastric Adenocarcinoma with surgical resection 01/10/17; was hospitalized 10 days soon thereafter for CDif.  Chemo started mid-May x 3 months, followed by radiation, started early September and completed today (08/25/17). No other treatment planned but will do follow-ups with surgeon and Dr. Irene Limbo.  Is to have Portacath removed, and is to have a follow-up scan in 3 weeks. No other health issues.   Patient Stated Goals build my strength back up; likes to travel   Currently in Pain? Yes   Pain Score 2    Pain Location Abdomen  stomach   Pain Descriptors / Indicators Discomfort            [facility] PT Assessment - 08/31/17 0001      6 Minute Walk- Baseline   6 Minute Walk- Baseline yes  378 meteres- 572 is normal for community dwelling elderly                      [facility] Adult PT  Treatment/Exercise - 08/31/17 0001      Shoulder Exercises: Supine   External Rotation AAROM;Both;10 reps  with dowel with 5-10 sec hold    Flexion AAROM;Both;10 reps  with dowel with 5 - 10 sec holds   ABduction AAROM;Both;10 reps  with dowel with 5 - 10 sec holds   Other Supine Exercises pec stretch with hands behind head, pushing elbows back                        Long Term Clinic Goals - 08/31/17 1708      CC Long Term Goal  #1   Title Pt. will be independent in a home exercise program for right shoulder stiffness, strengthening, and endurance.   Time 4   Period Weeks   Status New     CC Long Term Goal  #2    Title Pt. will be knowledgeable about appropriate exercise to transition to at his gym, [employer].   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   Time 4   Period Weeks   Status New     CC Long Term Goal  #4   Title Pt will be able to ambulate 500 meters during 6 min walk to allow him to be more functional and independent   Baseline 12 m- avg for a community dwelling man that is 59 is 572 m   Time 4   Period Weeks   Status New   Target Date 10/04/17            Plan - 08/31/17 1705    Clinical Impression Statement Instructed pt today in right shoulder AAROM exercises in supine using dowel and pec stretch with hands behind head pushing elbows down towards mat. Pt stated he could feel a stretch in the right shoulder joint with all new exercises. Encouraged pt to attend [employer] program. Began PROM to R shoulder today. Also perform 6 min walk test. He was able to ambulate 378 meters in 6 min and the average for a community dwelling man of his age is 66 m.    Rehab Potential Good   PT Frequency 2x / week   PT Duration 4 weeks   PT Treatment/Interventions ADLs/Self Care Home Management;Therapeutic exercise;Balance training;Patient/family education;Manual techniques;Passive range of motion   PT Next Visit Plan ContinueROM for right shoulder and assess indep with  HEP given today. Begin endurance exercise. Begin talking about what he can plan to work on at [employer], where he is a member.  He would like to work toward transition to that gym.   Consulted and Agree with Plan of Care Patient      Patient will benefit from skilled therapeutic intervention in order to improve the following deficits and impairments:  Decreased range of motion, Impaired UE functional use, Decreased strength, Decreased mobility, Decreased activity tolerance  Visit Diagnosis: Stiffness of right  shoulder, not elsewhere classified  Muscle weakness (generalized)  Other abnormalities of gait and mobility     Problem List Patient Active Problem List   Diagnosis Date Noted  . Port catheter in place 08/03/2017  . Encounter for feeding tube placement 03/25/2017  . GERD (gastroesophageal reflux disease) 03/25/2017  . Anemia 03/25/2017  . Encounter for care related to feeding tube 03/25/2017  . History of Clostridium  difficile colitis 03/25/2017  . Counseling regarding advanced directives and goals of care 03/17/2017  . Counseling regarding advanced care planning and goals of care 03/15/2017  . NSVT (nonsustained ventricular tachycardia) ([place])   . Elevated troponin I level   . Protein-calorie malnutrition, severe 01/12/2017  . Malignant neoplasm of cardia of stomach ([place]) 01/10/2017    Allyson Sabal [facility] 08/31/2017, 5:10 PM  [place], [place], [address] Phone: [phone]   Fax:  [phone]  Name: [name] MRN: [id number] Date of Birth: 1950-12-15  Manus Gunning, PT 08/31/17 5:10 PM

## 2017-08-31 NOTE — Patient Instructions (Signed)
SHOULDER: Flexion - Supine (Cane)        Cancer Rehab [phone]    Hold cane in both hands. Raise arms up overhead. Do not allow back to arch. Hold _5__ seconds. Do __5-10__ times; __1-2__ times a day.  Shoulder: Abduction (Supine)    With right arm flat on floor, hold dowel in palm. Slowly move arm up to side of head by pushing with opposite arm. Do not let elbow bend. Hold __5-10__ seconds. Repeat __5-10__ times. Do __1-2__ sessions per day. CAUTION: Stretch slowly and gently.  Copyright  VHI. All rights reserved.   Shoulder Blade Stretch    Clasp fingers behind head with elbows touching in front of face. Pull elbows back while pressing shoulder blades together. Relax and hold as tolerated, can place pillow under elbow here for comfort as needed and to allow for prolonged stretch.  Repeat __5__ times. Do __1-2__ sessions per day.     SHOULDER: External Rotation - Supine (Cane)    Hold cane with both hands. Rotate arm away from body. Keep elbow on floor and next to body. _5-10__ reps per set, hold 5 seconds, _1-2__ sets per day. Add towel to keep elbow at side.  Copyright  VHI. All rights reserved.

## 2017-09-06 ENCOUNTER — Other Ambulatory Visit (HOSPITAL_BASED_OUTPATIENT_CLINIC_OR_DEPARTMENT_OTHER): Payer: Medicare Other

## 2017-09-06 ENCOUNTER — Ambulatory Visit (HOSPITAL_BASED_OUTPATIENT_CLINIC_OR_DEPARTMENT_OTHER): Payer: Medicare Other | Admitting: Hematology

## 2017-09-06 ENCOUNTER — Encounter: Payer: Self-pay | Admitting: Hematology

## 2017-09-06 VITALS — BP 116/59 | HR 71 | Temp 97.2°F | Resp 17 | Ht 71.0 in | Wt 252.8 lb

## 2017-09-06 DIAGNOSIS — E440 Moderate protein-calorie malnutrition: Secondary | ICD-10-CM

## 2017-09-06 DIAGNOSIS — K2960 Other gastritis without bleeding: Secondary | ICD-10-CM | POA: Diagnosis not present

## 2017-09-06 DIAGNOSIS — C164 Malignant neoplasm of pylorus: Secondary | ICD-10-CM

## 2017-09-06 DIAGNOSIS — D709 Neutropenia, unspecified: Secondary | ICD-10-CM

## 2017-09-06 DIAGNOSIS — C169 Malignant neoplasm of stomach, unspecified: Secondary | ICD-10-CM

## 2017-09-06 DIAGNOSIS — C160 Malignant neoplasm of cardia: Secondary | ICD-10-CM

## 2017-09-06 DIAGNOSIS — D630 Anemia in neoplastic disease: Secondary | ICD-10-CM

## 2017-09-06 LAB — CBC & DIFF AND RETIC
BASO%: 0.6 % (ref 0.0–2.0)
Basophils Absolute: 0 10*3/uL (ref 0.0–0.1)
EOS%: 2.3 % (ref 0.0–7.0)
Eosinophils Absolute: 0 10*3/uL (ref 0.0–0.5)
HCT: 33.9 % — ABNORMAL LOW (ref 38.4–49.9)
HGB: 11.3 g/dL — ABNORMAL LOW (ref 13.0–17.1)
Immature Retic Fract: 7.4 % (ref 3.00–10.60)
LYMPH%: 11.7 % — ABNORMAL LOW (ref 14.0–49.0)
MCH: 29 pg (ref 27.2–33.4)
MCHC: 33.3 g/dL (ref 32.0–36.0)
MCV: 87.1 fL (ref 79.3–98.0)
MONO#: 0.4 10*3/uL (ref 0.1–0.9)
MONO%: 22 % — ABNORMAL HIGH (ref 0.0–14.0)
NEUT#: 1.2 10*3/uL — ABNORMAL LOW (ref 1.5–6.5)
NEUT%: 63.4 % (ref 39.0–75.0)
Platelets: 103 10*3/uL — ABNORMAL LOW (ref 140–400)
RBC: 3.89 10*6/uL — ABNORMAL LOW (ref 4.20–5.82)
RDW: 20.5 % — ABNORMAL HIGH (ref 11.0–14.6)
Retic %: 1.49 % (ref 0.80–1.80)
Retic Ct Abs: 57.96 10*3/uL (ref 34.80–93.90)
WBC: 1.9 10*3/uL — ABNORMAL LOW (ref 4.0–10.3)
lymph#: 0.2 10*3/uL — ABNORMAL LOW (ref 0.9–3.3)
nRBC: 0 % (ref 0–0)

## 2017-09-06 LAB — COMPREHENSIVE METABOLIC PANEL
ALT: 19 U/L (ref 0–55)
AST: 33 U/L (ref 5–34)
Albumin: 3.4 g/dL — ABNORMAL LOW (ref 3.5–5.0)
Alkaline Phosphatase: 114 U/L (ref 40–150)
Anion Gap: 8 mEq/L (ref 3–11)
BUN: 9 mg/dL (ref 7.0–26.0)
CO2: 27 mEq/L (ref 22–29)
Calcium: 9.2 mg/dL (ref 8.4–10.4)
Chloride: 105 mEq/L (ref 98–109)
Creatinine: 0.8 mg/dL (ref 0.7–1.3)
EGFR: >60 mL/min/{1.73_m2} (ref >60)
Glucose: 147 mg/dl — ABNORMAL HIGH (ref 70–140)
Potassium: 4.3 mEq/L (ref 3.5–5.1)
Sodium: 139 mEq/L (ref 136–145)
Total Bilirubin: 0.69 mg/dL (ref 0.20–1.20)
Total Protein: 6.8 g/dL (ref 6.4–8.3)

## 2017-09-06 NOTE — Progress Notes (Signed)
HEMATOLOGY/ONCOLOGY CLINIC NOTE  Date of Service: 09/06/17   Patient Care Team: [name], MD as PCP - General (Internal Medicine) [name] (Surgery)    CHIEF COMPLAINTS/PURPOSE OF CONSULTATION:   f/u for gastric Adenocarcinoma.  HISTORY OF PRESENTING ILLNESS:   [name] is a wonderful 66 y.o. male is here for continued evaluation and management of recently diagnosed pT4a, pN3b, pM1 Gastric Adenocarcinoma.  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 Patient had presented to primary care physician with dyspeptic symptoms, partial gastric outlet obstruction and weight loss of 60 pounds. Patient has had a distal gastrectomy, cholecystectomy with jejunal feeding tube placement by Dr. Barry Dienes on 01/10/2017 for gastric obstruction secondary to adenocarcinoma of the gastric pylorus. He was noted to have invaded into the pancreas and a bit of the anterior pancreatic head was resected en bloc with the tumor.  Pathology showed  " INVASIVE MODERATELY TO POORLY DIFFERENTIATED ADENOCARCINOMA, SPANNING 4 CM IN GREATEST DIMENSION. TUMOR INVADES THROUGH STOMACH WALL TO INVOLVE SEROSAL SURFACE.  LYMPH/VASCULAR INVASION IS IDENTIFIED.  MARGINS ARE NEGATIVE.- FIFTEEN OF THIRTY NINE LYMPH NODES POSITIVE FOR METASTATIC ADENOCARCINOMA (15/39). EXTRACAPSULAR EXTENSION IS IDENTIFIED. 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  Patient had a prolonged hospitalization post surgery  due to significant NG tube output. Needed epidural and PCA for pain control. It took a while for him to be able to tolerate progression of 2 feeding. He had an abdominal drain due to a small collection near his pancreatic head. Was treated with Augmentin for this. Patient was in the hospital from 01/10/2017 and  was eventually discharged on 01/27/2017.  Patient postponed several attempts at posthospitalization follow-up. He has been recovering at home and has been optimizing his tube feeding. Patient was recently followed up by Dr. Barry Dienes and has had a port placement on 02/23/2017 and preparation for possible chemotherapy.  He notes that his tube feeding is up to 40 mL per hour for 12 hours and has targeted is 16 ml per hour. He was recently admitted to [facility] in [place] for a C. difficile colitis that caused a severe diarrhea. he is currently on oral vancomycin has completed about 5 days of treatment and is also on probiotics.  CURRENT THERAPY:  Concurrent Chemoradiation with Xeloda. He started 07/18/17  INTERVAL HISTORY  [name] is here for f/u for monitoring treatment toxicities with his concurrent chemoradiation with Xeloda. He reports that he is doing well overall. He has now completed treatment since 08/24/2017.   On review of systems, patient reports nausea mild Grade 1, acid reflux, decreased (metallic) taste, and denies diarrhea and any other accompanying symptoms.    MEDICAL HISTORY:  Past Medical History:  Diagnosis Date  . Anemia   . Cancer [facility])    Gastric Adenocarcinoma  . Gastric outlet obstruction 01/2017  . GERD (gastroesophageal reflux disease)   . Pre-diabetes    denies  . SVT (supraventricular tachycardia) ([place])    during Admission and Surgery- 01/2017    SURGICAL HISTORY: Past Surgical History:  Procedure Laterality Date  . CHOLECYSTECTOMY N/A 01/10/2017   Procedure: CHOLECYSTECTOMY;  Surgeon: Stark Klein, MD;  Location: [place];  Service: General;  Laterality: N/A;  . COLONOSCOPY    . COLONOSCOPY  10/11/2011   Procedure: COLONOSCOPY;  Surgeon: Daneil Dolin, MD;  [facility];  Service: Endoscopy;  Laterality: N/A;  10:30 AM  . ESOPHAGOGASTRODUODENOSCOPY N/A 12/30/2016   Procedure: ESOPHAGOGASTRODUODENOSCOPY (EGD);  Surgeon: Carol Ada, MD;   [facility];  Service: Endoscopy;  Laterality: N/A;  . GASTRECTOMY N/A 01/10/2017   Procedure: DISTAL GASTRECTOMY, JEJUNUM  FEEDING TUBE PLACEMENT;  Surgeon: Stark Klein, MD;  Location: [place];  Service: General;  Laterality: N/A;  . IR GENERIC HISTORICAL  01/24/2017   IR [place] DUODEN/JEJUNO TUBE PERCUT W/FLUORO 01/24/2017 Aletta Edouard, MD [facility]-INTERV RAD  . IR REPLC DUODEN/JEJUNO TUBE PERCUT W/FLUORO  03/26/2017  . PORTACATH PLACEMENT N/A 02/23/2017   Procedure: INSERTION PORT-A-CATH;  Surgeon: Stark Klein, MD;  Location: [place];  Service: General;  Laterality: N/A;  . testicle removed  1982    SOCIAL HISTORY: Social History   Social History  . Marital status: Married    Spouse name: N/A  . Number of children: N/A  . Years of education: N/A   Occupational History  . Not on file.   Social History Main Topics  . Smoking status: Never Smoker  . Smokeless tobacco: Never Used  . Alcohol use No     Comment: occ beer or wine but none in last 6 months   . Drug use: No  . Sexual activity: Not on file   Other Topics Concern  . Not on file   Social History Narrative  . No narrative on file    FAMILY HISTORY: Family History  Problem Relation Age of Onset  . Prostate cancer Father   . Colon cancer Neg Hx     ALLERGIES:  is allergic to no known allergies.  MEDICATIONS:  Current Outpatient Prescriptions  Medication Sig Dispense Refill  . dexamethasone (DECADRON) 4 MG tablet Take 2 tablets (8 mg total) by mouth daily. Start the day after chemotherapy for 2 days. Take with food. (Patient not taking: Reported on 08/25/2017) 30 tablet 1  . famotidine (PEPCID) 20 MG tablet Take 20 mg by mouth daily.     . ferrous sulfate 325 (65 FE) MG tablet Take 325 mg by mouth daily with breakfast.    . HYDROcodone-acetaminophen (NORCO/VICODIN) 5-325 MG tablet Take 1-2 tablets by mouth every 6 (six) hours as needed for moderate pain. 30 tablet 0  . lidocaine-prilocaine (EMLA) cream Apply  to affected area once 30 g 3  . Multiple Vitamin (MULTIVITAMIN) tablet Take 1 tablet by mouth daily.    . ondansetron (ZOFRAN) 8 MG tablet Take 1 tablet (8 mg total) by mouth every 8 (eight) hours as needed for refractory nausea / vomiting. Start on day 3 after chemotherapy. (Patient not taking: Reported on 08/25/2017) 30 tablet 1  . prochlorperazine (COMPAZINE) 10 MG tablet Take 1 tablet (10 mg total) by mouth every 6 (six) hours as needed (Nausea or vomiting). 30 tablet 1  . traZODone (DESYREL) 50 MG tablet TAKE 1 TABLET (50 MG TOTAL) BY MOUTH AT BEDTIME AS NEEDED FOR SLEEP. (Patient not taking: Reported on 08/25/2017) 30 tablet 0   No current facility-administered medications for this visit.     REVIEW OF SYSTEMS:    10 Point review of Systems was done is negative except as noted above.  PHYSICAL EXAMINATION: ECOG PERFORMANCE STATUS: 2 - Symptomatic, <50% confined to bed  . Vitals:   09/06/17 1410  BP: (!) 116/59  Pulse: 71  Resp: 17  Temp: (!) 97.2 F (36.2 C)  SpO2: 100%   Filed Weights   09/06/17 1410  Weight: 252 lb 12.8 oz (114.7 kg)   .  Body mass index is 35.26 kg/m.   GENERAL:alert, in no acute distress and comfortable SKIN: no acute rashes, no significant lesions EYES: conjunctiva are pink and non-injected, sclera anicteric OROPHARYNX: MMM, no exudates, no oropharyngeal erythema or ulceration NECK: supple, no JVD LYMPH:  no palpable lymphadenopathy in the cervical, axillary or inguinal regions LUNGS: clear to auscultation b/l with normal respiratory effort HEART: regular rate & rhythm ABDOMEN:  normoactive bowel sounds , non tender, not distended. Feeding tube site healing. Extremity: no pedal edema PSYCH: alert & oriented x 3 with fluent speech NEURO: no focal motor/sensory deficits  LABORATORY DATA:  I have reviewed the data as listed  . CBC Latest Ref Rng & Units 09/06/2017 08/16/2017 08/03/2017  WBC 4.0 - 10.3 10e3/uL 1.9(L) 2.8(L) 3.1(L)  Hemoglobin  13.0 - 17.1 g/dL 11.3(L) 12.0(L) 11.1(L)  Hematocrit 38.4 - 49.9 % 33.9(L) 36.5(L) 34.0(L)  Platelets 140 - 400 10e3/uL 103(L) 103(L) 103(L)   ANC 1.2k . CMP Latest Ref Rng & Units 09/06/2017 08/16/2017 08/03/2017  Glucose 70 - 140 mg/dl 147(H) 159(H) 132  BUN 7.0 - 26.0 mg/dL 9.0 10.2 7.7  Creatinine 0.7 - 1.3 mg/dL 0.8 0.9 0.8  Sodium 136 - 145 mEq/L 139 141 141  Potassium 3.5 - 5.1 mEq/L 4.3 4.3 3.7  Chloride 101 - 111 mmol/L - - -  CO2 22 - 29 mEq/L 27 24 25   Calcium 8.4 - 10.4 mg/dL 9.2 9.9 9.4  Total Protein 6.4 - 8.3 g/dL 6.8 7.2 6.6  Total Bilirubin 0.20 - 1.20 mg/dL 0.69 0.82 0.63  Alkaline Phos 40 - 150 U/L 114 82 66  AST 5 - 34 U/L 33 27 21  ALT 0 - 55 U/L 19 17 11       RADIOGRAPHIC STUDIES: I have personally reviewed the radiological images as listed and agreed with the findings in the report. No results found.  ASSESSMENT & PLAN:   66 year old African-American male with  1) Resected Stage IV pT4a, pN3b, pM1 invasive moderately-poorly Gastric Adenocarcinoma . Her 2 neg by FISH LVI + margins neg 15/39 LN +ve, Extracapsular invasion +, some involvement of pancreatic capsule there pM1 Presented with severe pyloric stenosis with ulceration. S/p 60 pound weight loss- now resolved. S/p distal gastrectomy, cholecystectomy and en bloc dissection off the small part of the pancreatic head  with jejunal feeding tube placement. Initial CT abdomen and pelvis did not show any local regional lymphadenopathy. Rpt CT chest/abd/pelvis from 03/22/2017 - no overt evidence of residual disease or metastatic disease. Post-surgical peripancreatic fluid collection resolving. CT chest/abd/pelvis 06/21/2017 after 6 cycles of FOLFOX -  No evidence of residual or metastatic carcinoma within the chest, abdomen, or pelvis -Started Chemoradiation on 07/18/17 with Xeloda (825mg /m2 twice daily Monday through Friday while on radiation) and completed treatment on 08/24/2017.   #2 s/p moderate protein  calorie malnutrition patient had lost about 60 pounds in the last 2-3 months but has been gaining back his lost weight well.  . 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  #4 status post port placement on 02/23/2017  -If he has NED status after completion of concurrent chemoradiation will consider port removal.  #5 recent Clostridium difficile colitis -. No overt issues with diarrhea currently. Has completed his prolonged po vancomycin course.   #  6 suspected sleep apnea.Has not had a sleep study.Wife notes significant snoring which is important with weight loss. Will have to be careful with excessive sedation. 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 -f/u with outpatient cancer rehab for fatigue and endurance as scheduled. -Sucralfate and acid suppressant to reduce inflammation and aid with acid reflux and radiation esophagitis   RTC with Dr. Irene Limbo in 4 weeks with labs  All of the patients questions were answered with apparent satisfaction. The patient knows to call the clinic with any problems, questions or concerns.  I spent 20 minutes counseling the patient face to face. The total time spent in the appointment was 25 minutes and more than 50% was on counseling and direct patient cares.   All of the patients questions were answered with apparent satisfaction. The patient knows to call the clinic with any problems, questions or concerns.    Sullivan Lone MD [place] AAHIVMS [facility] Hematology/Oncology Physician [facility]  (Office):       [phone] (Work cell):  [phone] (Fax):           [phone]   This document serves as a record of services personally performed by Sullivan Lone, MD. It was created on her behalf by Alean Rinne, a trained medical scribe. The creation of this record is based on the scribe's personal observations and the provider's statements to them. This document has been checked and approved by the attending provider.

## 2017-09-07 ENCOUNTER — Telehealth: Payer: Self-pay

## 2017-09-07 MED ORDER — OMEPRAZOLE 20 MG PO CPDR: 20.0000 mg | DELAYED_RELEASE_CAPSULE | Freq: Two times a day (BID) | ORAL | 2 refills | Status: DC

## 2017-09-07 MED ORDER — SUCRALFATE 1 GM/10ML PO SUSP: 1.0000 g | Freq: Three times a day (TID) | ORAL | 1 refills | Status: DC

## 2017-09-07 NOTE — Telephone Encounter (Signed)
Pt called that [facility] was not [facility]. It was for his stomach. Please send in to [facility] [place].  S/w Dr Irene Limbo and he said he would e-scribe carafate and PPI. - done

## 2017-09-11 ENCOUNTER — Telehealth: Payer: Self-pay | Admitting: Hematology

## 2017-09-11 NOTE — Telephone Encounter (Signed)
Scheduled appt per 10/24 lso - left message with appt date and time on patients voicemail.

## 2017-09-12 ENCOUNTER — Ambulatory Visit: Payer: Medicare Other | Admitting: Physical Therapy

## 2017-09-12 VITALS — HR 87

## 2017-09-12 DIAGNOSIS — M6281 Muscle weakness (generalized): Secondary | ICD-10-CM

## 2017-09-12 DIAGNOSIS — M25611 Stiffness of right shoulder, not elsewhere classified: Secondary | ICD-10-CM | POA: Diagnosis not present

## 2017-09-12 DIAGNOSIS — R2689 Other abnormalities of gait and mobility: Secondary | ICD-10-CM | POA: Diagnosis not present

## 2017-09-12 NOTE — Therapy (Signed)
[place], [place], [address] Phone: [phone]   Fax:  [phone]  Physical Therapy Treatment  Patient Details  Name: [name] MRN: [id number] Date of Birth: 1951-10-16 Referring Provider: Dr. Sullivan Lone  Encounter Date: 09/12/2017      PT End of Session - 09/12/17 1755    Visit Number 3   Number of Visits 9   Date for PT Re-Evaluation 10/04/17   PT Start Time 6967   PT Stop Time 1605   PT Time Calculation (min) 42 min   Activity Tolerance Patient tolerated treatment well   Behavior During Therapy [facility] for tasks assessed/performed      Past Medical History:  Diagnosis Date  . Anemia   . Cancer [facility])    Gastric Adenocarcinoma  . Gastric outlet obstruction 01/2017  . GERD (gastroesophageal reflux disease)   . Pre-diabetes    denies  . SVT (supraventricular tachycardia) ([place])    during Admission and Surgery- 01/2017    Past Surgical History:  Procedure Laterality Date  . CHOLECYSTECTOMY N/A 01/10/2017   Procedure: CHOLECYSTECTOMY;  Surgeon: Stark Klein, MD;  Location: [place];  Service: General;  Laterality: N/A;  . COLONOSCOPY    . COLONOSCOPY  10/11/2011   Procedure: COLONOSCOPY;  Surgeon: Daneil Dolin, MD;  [facility];  Service: Endoscopy;  Laterality: N/A;  10:30 AM  . ESOPHAGOGASTRODUODENOSCOPY N/A 12/30/2016   Procedure: ESOPHAGOGASTRODUODENOSCOPY (EGD);  Surgeon: Carol Ada, MD;  [facility];  Service: Endoscopy;  Laterality: N/A;  . GASTRECTOMY N/A 01/10/2017   Procedure: DISTAL GASTRECTOMY, JEJUNUM  FEEDING TUBE PLACEMENT;  Surgeon: Stark Klein, MD;  Location: [place];  Service: General;  Laterality: N/A;  . IR GENERIC HISTORICAL  01/24/2017   IR [place] DUODEN/JEJUNO TUBE PERCUT W/FLUORO 01/24/2017 Aletta Edouard, MD [facility]-INTERV RAD  . IR REPLC DUODEN/JEJUNO TUBE PERCUT W/FLUORO  03/26/2017  . PORTACATH PLACEMENT N/A 02/23/2017   Procedure: INSERTION  PORT-A-CATH;  Surgeon: Stark Klein, MD;  Location: [place];  Service: General;  Laterality: N/A;  . 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  There's a lot more to do." Feeling fairly good today. Feels a bit looser since doing the exercises at home and with his work on trees.   Pertinent History pT4a, pN3b, pM1 gastric Adenocarcinoma with surgical resection 01/10/17; was hospitalized 10 days soon thereafter for CDif.  Chemo started mid-May x 3 months, followed by radiation, started early September and completed today (08/25/17). No other treatment planned but will do follow-ups with surgeon and Dr. Irene Limbo.  Is to have Portacath removed, and is to have a follow-up scan in 3 weeks. No other health issues.   Currently in Pain? No/denies                         [facility] Adult PT Treatment/Exercise - 09/12/17 0001      Exercises   Exercises Other Exercises   Other Exercises  Discussed his prior exercise program at [employer]: treadmill up to 25 minutes at a moderate pace; used machines for UEs, LEs, and abdomen; also did elliptical walker  doesn't like the stationary bike     Lumbar Exercises:  Aerobic   UBE (Upper Arm Bike) seat at #9, L1, 2' forward, 2' back     Knee/Hip Exercises: Stretches   Passive Hamstring Stretch Right;Left;30 seconds  in sitting   Quad Stretch Right;Left;30 seconds  with knee on chair, support with hand on wall     Knee/Hip Exercises: Aerobic   Elliptical on quick start x 1.5 mins, but c/o tight feeling in knees, so stretched and did treadmill, then back on elliptical at quick start x 1 min. (pt.'s choice to stop)   Tread Mill at 1.1 mph (pt.'s choice), no incline, x 2 mins.; then 1.2 mph x 6 mins. more, then 1.3 mph x 2 more minutes; then cool-down at  slower pace (1.1 mph) x 1 minute.  held on to front handle with both hands   Other Aerobic Pt. reported just mild SOB, moderate exertion while on treadmill     Knee/Hip Exercises: Machines for Strengthening   Other Machine Tried seated leg press but couldn't get machine adjusted to fit him                        Long Term Clinic Goals - 08/31/17 1708      CC Long Term Goal  #1   Title Pt. will be independent in a home exercise program for right shoulder stiffness, strengthening, and endurance.   Time 4   Period Weeks   Status New     CC Long Term Goal  #2   Title Pt. will be knowledgeable about appropriate exercise to transition to at his gym, [employer].   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   Time 4   Period Weeks   Status New     CC Long Term Goal  #4   Title Pt will be able to ambulate 500 meters during 6 min walk to allow him to be more functional and independent   Baseline 57 m- avg for a community dwelling man that is 58 is 51 m   Time 4   Period Weeks   Status New   Target Date 10/04/17            Plan - 09/12/17 1755    Clinical Impression Statement Pt. did well with focus on endurance exercise today. He felt like he had had a workout in his legs in particular.  He did well with O2 saturation and appropriate increase in heart rate with all activities.    Rehab Potential Good   PT Frequency 2x / week   PT Duration 4 weeks   PT Treatment/Interventions ADLs/Self Care Home Management;Therapeutic exercise;Balance training;Patient/family education;Manual techniques;Passive range of motion   PT Next Visit Plan Assess independence with shoulder HEP.  Try machines for strengthening like what he would have available at [employer]. Continue endurance on UBE, treadmill, elliptical.   Consulted and Agree with Plan of Care Patient       Patient will benefit from skilled therapeutic intervention in order to improve the following deficits and impairments:  Decreased range of motion, Impaired UE functional use, Decreased strength, Decreased mobility, Decreased activity tolerance  Visit Diagnosis: Stiffness of right shoulder, not elsewhere classified  Muscle weakness (generalized)  Other abnormalities of gait and mobility     Problem List Patient Active Problem List  Diagnosis Date Noted  . Port catheter in place 08/03/2017  . Encounter for feeding tube placement 03/25/2017  . GERD (gastroesophageal reflux disease) 03/25/2017  . Anemia 03/25/2017  . Encounter for care related to feeding tube 03/25/2017  . History of Clostridium difficile colitis 03/25/2017  . Counseling regarding advanced directives and goals of care 03/17/2017  . Counseling regarding advanced care planning and goals of care 03/15/2017  . NSVT (nonsustained ventricular tachycardia) ([place])   . Elevated troponin I level   . Protein-calorie malnutrition, severe 01/12/2017  . Malignant neoplasm of cardia of stomach ([place]) 01/10/2017    SALISBURY,DONNA 09/12/2017, 5:58 PM  [place], [place], [address] Phone: [phone]   Fax:  [phone]  Name: [name] MRN: [id number] Date of Birth: December 14, 1950  Serafina Royals, PT 09/12/17 5:58 PM

## 2017-09-14 ENCOUNTER — Ambulatory Visit: Payer: Medicare Other | Attending: Hematology | Admitting: Physical Therapy

## 2017-09-14 DIAGNOSIS — R2689 Other abnormalities of gait and mobility: Secondary | ICD-10-CM | POA: Diagnosis not present

## 2017-09-14 DIAGNOSIS — M25611 Stiffness of right shoulder, not elsewhere classified: Secondary | ICD-10-CM | POA: Diagnosis not present

## 2017-09-14 DIAGNOSIS — M6281 Muscle weakness (generalized): Secondary | ICD-10-CM | POA: Insufficient documentation

## 2017-09-14 NOTE — Therapy (Signed)
[place], [place], [address] Phone: [phone]   Fax:  [phone]  Physical Therapy Treatment  Patient Details  Name: [name] MRN: [id number] Date of Birth: 25-May-1951 Referring Provider: Dr. Sullivan Lone  Encounter Date: 09/14/2017      PT End of Session - 09/14/17 1707    Visit Number 4   Number of Visits 9   Date for PT Re-Evaluation 10/04/17   PT Start Time 1604   PT Stop Time 1645   PT Time Calculation (min) 41 min   Activity Tolerance Patient tolerated treatment well   Behavior During Therapy [facility] for tasks assessed/performed      Past Medical History:  Diagnosis Date  . Anemia   . Cancer [facility])    Gastric Adenocarcinoma  . Gastric outlet obstruction 01/2017  . GERD (gastroesophageal reflux disease)   . Pre-diabetes    denies  . SVT (supraventricular tachycardia) ([place])    during Admission and Surgery- 01/2017    Past Surgical History:  Procedure Laterality Date  . CHOLECYSTECTOMY N/A 01/10/2017   Procedure: CHOLECYSTECTOMY;  Surgeon: Stark Klein, MD;  Location: [place];  Service: General;  Laterality: N/A;  . COLONOSCOPY    . COLONOSCOPY  10/11/2011   Procedure: COLONOSCOPY;  Surgeon: Daneil Dolin, MD;  [facility];  Service: Endoscopy;  Laterality: N/A;  10:30 AM  . ESOPHAGOGASTRODUODENOSCOPY N/A 12/30/2016   Procedure: ESOPHAGOGASTRODUODENOSCOPY (EGD);  Surgeon: Carol Ada, MD;  Location: [facility];  Service: Endoscopy;  Laterality: N/A;  . GASTRECTOMY N/A 01/10/2017   Procedure: DISTAL GASTRECTOMY, JEJUNUM  FEEDING TUBE PLACEMENT;  Surgeon: Stark Klein, MD;  Location: [place];  Service: General;  Laterality: N/A;  . IR GENERIC HISTORICAL  01/24/2017   IR [place] DUODEN/JEJUNO TUBE PERCUT W/FLUORO 01/24/2017 Aletta Edouard, MD [facility]-INTERV RAD  . IR REPLC DUODEN/JEJUNO TUBE PERCUT W/FLUORO  03/26/2017  . PORTACATH PLACEMENT N/A 02/23/2017   Procedure: INSERTION  PORT-A-CATH;  Surgeon: Stark Klein, MD;  Location: [place];  Service: General;  Laterality: N/A;  . testicle removed  1982    There were no vitals filed for this visit.      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  Chemo started mid-May x 3 months, followed by radiation, started early September and completed today (08/25/17). No other treatment planned but will do follow-ups with surgeon and Dr. Irene Limbo.  Is to have Portacath removed, and is to have a follow-up scan in 3 weeks. No other health issues.   Patient Stated Goals build my strength back up; likes to travel   Currently in Pain? No/denies  he feels a little stiffness from muscle fatigue                          [facility] Adult PT Treatment/Exercise - 09/14/17 0001      High Level Balance   High Level Balance Activities Other (comment)   High Level Balance Comments On airex mat for bicep curls 10 reps with 4# in each hand, red theraband low rows 5 reps bilaterally and unilaterally. one pound weight held in both hands together for reach out in front , to one hip,  then to front and other hip, sway forward to toes, then to heels. hip shift side to side.  pt able to do all without loss of balance, but felt work in legs      Balance Poses: Yoga   Warrior I 1 rep  with each leg forward, constant verbal cues, for 2 breaths      Self-Care   Self-Care --  avoid overhead activites while right shoulder is painful      Knee/Hip Exercises: Standing   Lateral Step Up Right;Left;10 reps;Step Height: 6"  holding dowel for support   Forward Step Up Right;Left;10 reps;Step Height: 6"  holding dowel for support, also did back ward step up x 5      Shoulder Exercises:  Standing   External Rotation Strengthening;Right;Left;10 reps;Theraband   Theraband Level (Shoulder External Rotation) Level 3 (Green)   Internal Rotation Strengthening;Right;Left;10 reps;Theraband   Theraband Level (Shoulder Internal Rotation) Level 3 (Green)   Flexion Strengthening;Right;Left;10 reps;Theraband   Theraband Level (Shoulder Flexion) Level 3 (Green)   Extension Strengthening;Right;Left;10 reps;Theraband   Theraband Level (Shoulder Extension) Level 3 (Green)     Shoulder Exercises: Pulleys   Flexion 2 minutes   ABduction 2 minutes                        Long Term Clinic Goals - 08/31/17 1708      CC Long Term Goal  #1   Title Pt. will be independent in a home exercise program for right shoulder stiffness, strengthening, and endurance.   Time 4   Period Weeks   Status New     CC Long Term Goal  #2   Title Pt. will be knowledgeable about appropriate exercise to transition to at his gym, [employer].   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   Time 4   Period Weeks   Status New     CC Long Term Goal  #4   Title Pt will be able to ambulate 500 meters during 6 min walk to allow him to be more functional and independent   Baseline 68 m- avg for a community dwelling man that is 63 is 2 m   Time 4   Period Weeks   Status New   Target Date 10/04/17            Plan - 09/14/17 1708    Clinical Impression Statement Pt had some pain in right shoulder today after doing chainsaw work sometimes holding saw above shoulder level.  Educated in [place], and did LE strengthening and balance work on [employer] that pt felt beneficial    PT Next Visit Plan Assess independence with shoulder HEP. and check on right shoulder pain  Try machines for strengthening like what he would have available at [employer]. Continue endurance on UBE,  treadmill, elliptical. Alternately, work on [employer] or [employer] for balance work    Oncologist with Plan of Care Patient      Patient will benefit from skilled therapeutic intervention in order to improve the following deficits and impairments:     Visit Diagnosis: Stiffness of right shoulder, not elsewhere classified  Muscle weakness (generalized)     Problem List Patient Active Problem List   Diagnosis Date Noted  . Port catheter  in place 08/03/2017  . Encounter for feeding tube placement 03/25/2017  . GERD (gastroesophageal reflux disease) 03/25/2017  . Anemia 03/25/2017  . Encounter for care related to feeding tube 03/25/2017  . History of Clostridium difficile colitis 03/25/2017  . Counseling regarding advanced directives and goals of care 03/17/2017  . Counseling regarding advanced care planning and goals of care 03/15/2017  . NSVT (nonsustained ventricular tachycardia) ([place])   . Elevated troponin I level   . Protein-calorie malnutrition, severe 01/12/2017  . 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 Owens Shark PT  Norwood Levo 09/14/2017, 5:11 PM  [place], [place], [address] Phone: [phone]   Fax:  [phone]  Name: Breccan Galant MRN: [id number] Date of Birth: 1951/05/25

## 2017-09-14 NOTE — Patient Instructions (Signed)

## 2017-09-19 ENCOUNTER — Ambulatory Visit: Payer: Medicare Other | Admitting: Physical Therapy

## 2017-09-19 VITALS — HR 108

## 2017-09-19 DIAGNOSIS — R2689 Other abnormalities of gait and mobility: Secondary | ICD-10-CM | POA: Diagnosis not present

## 2017-09-19 DIAGNOSIS — M25611 Stiffness of right shoulder, not elsewhere classified: Secondary | ICD-10-CM

## 2017-09-19 DIAGNOSIS — M6281 Muscle weakness (generalized): Secondary | ICD-10-CM | POA: Diagnosis not present

## 2017-09-19 NOTE — Therapy (Signed)
[place], [place], [address] Phone: [phone]   Fax:  [phone]  Physical Therapy Treatment  Patient Details  Name: [name] MRN: [id number] Date of Birth: December 14, 1950 Referring Provider: Dr. Sullivan Lone   Encounter Date: 09/19/2017  PT End of Session - 09/19/17 1353    Visit Number  5    Number of Visits  9    Date for PT Re-Evaluation  10/04/17    PT Start Time  5956    PT Stop Time  1351    PT Time Calculation (min)  45 min    Activity Tolerance  Patient tolerated treatment well    Behavior During Therapy  [facility] for tasks assessed/performed       Past Medical History:  Diagnosis Date  . Anemia   . Cancer [facility])    Gastric Adenocarcinoma  . Gastric outlet obstruction 01/2017  . GERD (gastroesophageal reflux disease)   . Pre-diabetes    denies  . SVT (supraventricular tachycardia) ([place])    during Admission and Surgery- 01/2017    Past Surgical History:  Procedure Laterality Date  . COLONOSCOPY    . IR GENERIC HISTORICAL  01/24/2017   IR [place] DUODEN/JEJUNO TUBE PERCUT W/FLUORO 01/24/2017 Aletta Edouard, MD [facility]-INTERV RAD  . IR REPLC DUODEN/JEJUNO TUBE PERCUT W/FLUORO  03/26/2017  . testicle removed  1982    Vitals:   09/19/17 1326 09/19/17 1341  Pulse: 100 (!) 108  SpO2: 99% 99%    Subjective Assessment - 09/19/17 1310    Subjective  "I just drove back from [place]. Had to take a lawnmower back and some chemicals for the lawn.  Lifted 40-lb. bags, four of them, and had no problem. Has done the shoulder exercises and doesn't have questions about those. "I think I'm going to take out my bicycle."  Says he rides around a parking lot with no traffic but some incline; wears helmet; bikes about 30 mins.    Pertinent History  pT4a, pN3b, pM1 gastric Adenocarcinoma with surgical resection 01/10/17; was hospitalized 10 days soon thereafter for CDif.  Chemo started mid-May x 3 months,  followed by radiation, started early September and completed today (08/25/17). No other treatment planned but will do follow-ups with surgeon and Dr. Irene Limbo.  Is to have Portacath removed, and is to have a follow-up scan in 3 weeks. No other health issues.    Currently in Pain?  No/denies Shoulder pain comes and goes.   Shoulder pain comes and goes.                     [facility] Adult PT Treatment/Exercise - 09/19/17 0001      Neuro Re-ed    Neuro Re-ed Details   stand on BOSU ball on treadmill to have handrails available:  work on releasing UE support, then weight shift side to side without holding on, then mini squats x approx.8 without holding on.      Knee/Hip Exercises: Aerobic   Tread Mill  at 1.5 mph x 10 mins.  two hands on front handle; mild dyspnea after   two hands on front handle; mild dyspnea after     Knee/Hip Exercises: Standing   Lateral Step Up  Right;Left;10 reps;Hand Hold: 1;Step Height: 6" **with Airex foam pad on top of 6" step   **with Airex foam pad on top of 6" step   Forward Step Up  Right;Left;10 reps;Hand Hold: 1 with Airex foam pad on step;  mod. dyspnea following   with Airex foam pad on step; mod. dyspnea following   Lunge Walking - Round Trips  forward approx. 35 feet x 1 contact guard on gait belt   contact guard on gait belt   Other Standing Knee Exercises  yellow Theraband around ankles:  sidestep with big steps x 24 feet each way, then sidestep and squat x 24 feet each way moderate SOB afterwards   moderate SOB afterwards                    Long Term Clinic Goals - 08/31/17 1708      CC Long Term Goal  #1   Title  Pt. will be independent in a home exercise program for right shoulder stiffness, strengthening, and endurance.    Time  4    Period  Weeks    Status  New      CC Long Term Goal  #2   Title  Pt. will be knowledgeable about appropriate exercise to transition to at his gym, [employer].    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    Time  4    Period  Weeks    Status  New      CC Long Term Goal  #4   Title  Pt will be able to ambulate 500 meters during 6 min walk to allow him to be more functional and independent    Baseline  46 m- avg for a community dwelling man that is 93 is 95 m    Time  4    Period  Weeks    Status  New    Target Date  10/04/17         Plan - 09/19/17 1354    Clinical Impression Statement  Patient was challenged by exercises done today with a focus on LE strengthening and on balance.  He had monitored rests every few minutes after doing effortful exercise; SpO2 stayed high in the 90s and heart rate rose appropriately to low 100s. He felt mild-moderate dyspnea with exercises and noted fatigue in leg muscles at end of session.    Rehab Potential  Good    PT Frequency  2x / week    PT Duration  4 weeks    PT Treatment/Interventions  ADLs/Self Care Home Management;Therapeutic exercise;Balance training;Patient/family education;Manual techniques;Passive range of motion    PT Next Visit Plan  Check goals. Assess independence with shoulder HEP and check on right shoulder pain.  Try machines for strengthening like what he would have available at [employer]. Continue endurance on UBE, treadmill, elliptical. Alternately, work on [employer] or [employer] for balance work .    PT Home Exercise Plan  continue [place]    Consulted and Agree with Plan of Care  Patient       Patient will benefit from skilled therapeutic intervention in order to improve the following deficits and impairments:  Decreased range of motion, Impaired UE functional use, Decreased strength, Decreased mobility, Decreased activity tolerance  Visit Diagnosis: Stiffness of right shoulder, not elsewhere classified  Muscle weakness (generalized)  Other abnormalities of gait and  mobility     Problem List Patient Active Problem List   Diagnosis Date Noted  . Port catheter  in place 08/03/2017  . Encounter for feeding tube placement 03/25/2017  . GERD (gastroesophageal reflux disease) 03/25/2017  . Anemia 03/25/2017  . Encounter for care related to feeding tube 03/25/2017  . History of Clostridium difficile colitis 03/25/2017  . Counseling regarding advanced directives and goals of care 03/17/2017  . Counseling regarding advanced care planning and goals of care 03/15/2017  . NSVT (nonsustained ventricular tachycardia) ([place])   . Elevated troponin I level   . Protein-calorie malnutrition, severe 01/12/2017  . Malignant neoplasm of cardia of stomach ([place]) 01/10/2017    Elenore Wanninger 09/19/2017, 1:58 PM  [place], [place], [address] Phone: [phone]   Fax:  [phone]  Name: Deric Bocock MRN: [id number] Date of Birth: [birth year]  Serafina Royals, PT 09/19/17 1:58 PM

## 2017-09-21 ENCOUNTER — Ambulatory Visit: Payer: Medicare Other | Admitting: Physical Therapy

## 2017-09-21 ENCOUNTER — Other Ambulatory Visit: Payer: Self-pay

## 2017-09-21 DIAGNOSIS — M6281 Muscle weakness (generalized): Secondary | ICD-10-CM

## 2017-09-21 DIAGNOSIS — M25611 Stiffness of right shoulder, not elsewhere classified: Secondary | ICD-10-CM | POA: Diagnosis not present

## 2017-09-21 DIAGNOSIS — R2689 Other abnormalities of gait and mobility: Secondary | ICD-10-CM

## 2017-09-21 NOTE — Patient Instructions (Addendum)
.  Warrior I  DO NO BEND UPPER CHEST BACKWARDS   In wide stride, rotate back leg out 20, grounding foot, hands in prayer position in front of chest. Bend front leg 90. Reaching over head, look up. Hold for ____ breaths. Repeat, other leg forward. BEGINNER: Support body with hands on hips.   KEEP HIPS POINTING FORWARD     Copyright  VHI. All rights reserved.  Warrior I    In wide stride, rotate back leg out 20, grounding foot, hands in prayer position in front of chest. Bend front leg 90. Reaching over head, look up. Hold for ____ breaths. Repeat, other leg forward. BEGINNER: Support body with hands on hips.   Copyright  VHI. All rights reserved.

## 2017-09-21 NOTE — Therapy (Signed)
[place], [place], [address] Phone: [phone]   Fax:  [phone]  Physical Therapy Treatment  Patient Details  Name: Hilary Pundt MRN: [id number] Date of Birth: 1950-12-12 Referring Provider: Dr. Sullivan Lone   Encounter Date: 09/21/2017  PT End of Session - 09/21/17 1755    Visit Number  6    Number of Visits  9    Date for PT Re-Evaluation  10/04/17    PT Start Time  1300    PT Stop Time  1345    PT Time Calculation (min)  45 min       Past Medical History:  Diagnosis Date  . Anemia   . Cancer [facility])    Gastric Adenocarcinoma  . Gastric outlet obstruction 01/2017  . GERD (gastroesophageal reflux disease)   . Pre-diabetes    denies  . SVT (supraventricular tachycardia) ([place])    during Admission and Surgery- 01/2017    Past Surgical History:  Procedure Laterality Date  . COLONOSCOPY    . IR GENERIC HISTORICAL  01/24/2017   IR [place] DUODEN/JEJUNO TUBE PERCUT W/FLUORO 01/24/2017 Aletta Edouard, MD [facility]-INTERV RAD  . IR [place] DUODEN/JEJUNO TUBE PERCUT W/FLUORO  03/26/2017  . testicle removed  1982    There were no vitals filed for this visit.  Subjective Assessment - 09/21/17 1305    Subjective  Pt states he is feeling pretty good today. A little bit of pain in his stomach that he thinks is about normal.  He thinks he is ready to discharge from PT as he wants to go to [employer] soon     Pertinent History  pT4a, pN3b, pM1 gastric Adenocarcinoma with surgical resection 01/10/17; was hospitalized 10 days soon thereafter for CDif.  Chemo started mid-May x 3 months, followed by radiation, started early September and completed today (08/25/17). No other treatment planned but will do follow-ups with surgeon and Dr. Irene Limbo.  Is to have Portacath removed, and is to have a follow-up scan in 3 weeks. No other health issues.    Patient Stated Goals  build my strength back up; likes to travel     Currently in Pain?  Yes    Pain Score  2     Pain Location  Abdomen    Pain Orientation  Mid    Pain Descriptors / Indicators  Constant    Pain Type  Chronic pain    Pain Onset  1 to 4 weeks ago was told he would have it after radiation     Pain Relieving Factors  he doesn't see any difference with the medicne          [facility] PT Assessment - 09/21/17 0001      6 Minute walk- Post Test   6 Minute Walk Post Test  yes    HR (bpm)  97    02 Sat (%RA)  98 %    Modified Borg Scale for Dyspnea  2- Mild shortness of breath      6 minute walk test results    Aerobic Endurance Distance Walked  420 meters with no device, no balance loss, quick pace                   [facility] Adult PT Treatment/Exercise - 09/21/17 0001      High Level Balance   High Level Balance Comments  On airex mat for bicep curls 10 reps with 4# in each hand, red theraband low rows  5 reps bilaterally and unilateral      Balance Poses: Yoga   Warrior I  1 rep with each leg forward, constant verbal cues, for 2 breaths       Knee/Hip Exercises: Stretches   Active Hamstring Stretch  Right;Left;1 rep    Quad Stretch  Right;Left;1 rep standing on one leg with other with knee on mat       Knee/Hip Exercises: Standing   Lateral Step Up  Both step up on top and then down to each side of step x 10 reps     Other Standing Knee Exercises  stood in corner of room for heels up, toes up with eyes closed to challenge balance             PT Education - 2017/10/10 1754    Education provided  Yes    Education Details  reviewed balance exercise and LE stretches     Person(s) Educated  Patient    Methods  Explanation;Handout    Comprehension  Verbalized understanding;Returned demonstration             [facility] Goals - 2017-10-10 1308      CC Long Term Goal  #1   Title  Pt. will be independent in a home exercise program for right shoulder stiffness, strengthening, and endurance.    Status  Achieved       CC Long Term Goal  #2   Title  Pt. will be knowledgeable about appropriate exercise to transition to at his gym, [employer].    Baseline  October 10, 2017 Pt feels that he is ready to return.  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    Status  Achieved      CC Long Term Goal  #4   Title  Pt will be able to ambulate 500 meters during 6 min walk to allow him to be more functional and independent    Baseline  105 m- avg for a community dwelling man that is 5 is 43 m, on October 10, 2017 pt walked 420 meters     Status  Partially Met         Plan - 10/10/2017 1755    Clinical Impression Statement  Pt had met all goals except 6 minute walk test ( he walked 420 meters , goal was 500 meteres)  He feels that he has much more endurance and is able to do more of what he wants to at home.  He feels that he is ready to discharge and continue with his exercise at home     Rehab Potential  Good    PT Next Visit Plan  Discharge this episode as pt feels all goals are met     Consulted and Agree with Plan of Care  Patient       Patient will benefit from skilled therapeutic intervention in order to improve the following deficits and impairments:  Decreased range of motion, Impaired UE functional use, Decreased strength, Decreased mobility, Decreased activity tolerance  Visit Diagnosis: Stiffness of right shoulder, not elsewhere classified  Muscle weakness (generalized)  Other abnormalities of gait and mobility   G-Codes - Oct 10, 2017 1803    Functional Assessment Tool Used (Outpatient Only)  clinical judgement  Functional Limitation  Mobility: Walking and moving around    Mobility: Walking and Moving Around Goal Status [phone])  At least 1 percent but less than 20 percent impaired, limited or restricted    Mobility: Walking and Moving Around  Discharge Status [phone])  At least 1 percent but less than 20 percent impaired, limited or restricted       Problem List Patient Active Problem List   Diagnosis Date Noted  . Port catheter in place 08/03/2017  . Encounter for feeding tube placement 03/25/2017  . GERD (gastroesophageal reflux disease) 03/25/2017  . Anemia 03/25/2017  . Encounter for care related to feeding tube 03/25/2017  . History of Clostridium difficile colitis 03/25/2017  . Counseling regarding advanced directives and goals of care 03/17/2017  . Counseling regarding advanced care planning and goals of care 03/15/2017  . NSVT (nonsustained ventricular tachycardia) ([place])   . Elevated troponin I level   . Protein-calorie malnutrition, severe 01/12/2017  . 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  Patient goals were partially met. Patient is being discharged due to being pleased with the current functional level.  ?????    Donato Heinz. Owens Shark, PT  . Norwood Levo 09/21/2017, 6:03 PM  [place], [place], [address] Phone: [phone]   Fax:  [phone]  Name: Jaze Rodino MRN: [id number] Date of Birth: 01-28-1951

## 2017-09-26 ENCOUNTER — Encounter: Payer: Medicare Other | Admitting: Physical Therapy

## 2017-09-26 NOTE — Progress Notes (Signed)
HEMATOLOGY/ONCOLOGY CLINIC NOTE  Date of Service: 09/27/17   Patient Care Team: Steven Mccreedy, MD as PCP - General (Internal Medicine) Steven Klein MD (Surgery)    CHIEF COMPLAINTS/PURPOSE OF CONSULTATION:   f/u for gastric Adenocarcinoma.  HISTORY OF PRESENTING ILLNESS:   [name] is a wonderful 66 y.o. male is here for continued evaluation and management of recently diagnosed pT4a, pN3b, pM1 Gastric Adenocarcinoma.  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 Patient had presented to primary care physician with dyspeptic symptoms, partial gastric outlet obstruction and weight loss of 60 pounds. Patient has had a distal gastrectomy, cholecystectomy with jejunal feeding tube placement by Dr. Barry Strong on 01/10/2017 for gastric obstruction secondary to adenocarcinoma of the gastric pylorus. He was noted to have invaded into the pancreas and a bit of the anterior pancreatic head was resected en bloc with the tumor.  Pathology showed  " INVASIVE MODERATELY TO POORLY DIFFERENTIATED ADENOCARCINOMA, SPANNING 4 CM IN GREATEST DIMENSION. TUMOR INVADES THROUGH STOMACH WALL TO INVOLVE SEROSAL SURFACE.  LYMPH/VASCULAR INVASION IS IDENTIFIED.  MARGINS ARE NEGATIVE.- FIFTEEN OF THIRTY NINE LYMPH NODES POSITIVE FOR METASTATIC ADENOCARCINOMA (15/39). EXTRACAPSULAR EXTENSION IS IDENTIFIED. 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  Patient had a prolonged hospitalization post surgery  due to significant NG tube output. Needed epidural and PCA for pain control. It took a while for him to be able to tolerate progression of 2 feeding. He had an abdominal drain due to a small collection near his pancreatic head. Was treated with Augmentin for this. Patient was in the hospital from 01/10/2017 and  was eventually discharged on 01/27/2017.  Patient postponed several attempts at posthospitalization follow-up. He has been recovering at home and has been optimizing his tube feeding. Patient was recently followed up by Dr. Barry Strong and has had a port placement on 02/23/2017 and preparation for possible chemotherapy.  He notes that his tube feeding is up to 40 mL per hour for 12 hours and has targeted is 16 ml per hour. He was recently admitted to [facility] in [place] for a C. difficile colitis that caused a severe diarrhea. he is currently on oral vancomycin has completed about 5 days of treatment and is also on probiotics.  CURRENT THERAPY:  Concurrent Chemoradiation with Xeloda. He started 07/18/17  INTERVAL HISTORY  [name] is here for f/u for monitoring treatment toxicities following completion of his concurrent chemoradiation with Xeloda. He reports that he is doing well overall. He reports that he found therapy useful and will continue with [employer]. He completed therapy last week. He voices if he is able to obtain his flu vaccine today. He has been eating a vegetable based diet with chicken or fish as his meat options. He reports that he has been working as of lately and he has been able to operate a chainsaw. His WBC are at 2.9, PLT at 99, and hemoglobin at 11.5 as of today, 09/27/2017.   On review of systems, patient denies weight loss. He notes that some food items go faster than other food items. He reports abdominal discomfort after eating meals that has progressively gotten better. He reports intermittent loose stools. He has up to 1 bowel movement a day. He denies diarrhea or constipation. He denies fever, chills, or night sweats. He reports change in taste following chemotherapy. He reports right  shoulder pain. He notes numbness to his right 5th digit and transient numbness to the tips of his left thumb and index finger. He denies wrist pain. He denies skin changes at this time.      MEDICAL HISTORY:  Past Medical History:  Diagnosis Date  . Anemia   . Cancer [facility])    Gastric Adenocarcinoma  . Gastric outlet obstruction 01/2017  . GERD (gastroesophageal reflux disease)   . Pre-diabetes    denies  . SVT (supraventricular tachycardia) ([place])    during Admission and Surgery- 01/2017    SURGICAL HISTORY: Past Surgical History:  Procedure Laterality Date  . COLONOSCOPY    . IR GENERIC HISTORICAL  01/24/2017   IR [place] DUODEN/JEJUNO TUBE PERCUT W/FLUORO 01/24/2017 Aletta Edouard, MD [facility]-INTERV RAD  . IR [facility] DUODEN/JEJUNO TUBE PERCUT W/FLUORO  03/26/2017  . testicle removed  1982    SOCIAL HISTORY: Social History   Socioeconomic History  . Marital status: Married    Spouse name: Not on file  . Number of children: Not on file  . Years of education: Not on file  . Highest education level: Not on file  Social Needs  . Financial resource strain: Not on file  . Food insecurity - worry: Not on file  . Food insecurity - inability: Not on file  . Transportation needs - medical: Not on file  . Transportation needs - non-medical: Not on file  Occupational History  . Not on file  Tobacco Use  . Smoking status: Never Smoker  . Smokeless tobacco: Never Used  Substance and Sexual Activity  . Alcohol use: No    Comment: occ beer or wine but none in last 6 months   . Drug use: No  . Sexual activity: Not on file  Other Topics Concern  . Not on file  Social History Narrative  . Not on file    FAMILY HISTORY: Family History  Problem Relation Age of Onset  . Prostate cancer Father   . Colon cancer Neg Hx     ALLERGIES:  is allergic to no known allergies.  MEDICATIONS:  Current Outpatient Medications  Medication Sig Dispense Refill  . ferrous sulfate 325 (65 FE) MG tablet Take 325 mg by mouth daily with breakfast.    . Multiple Vitamin (MULTIVITAMIN) tablet Take 1 tablet by mouth daily.    . sucralfate (CARAFATE) 1 GM/10ML suspension Take 10 mLs (1 g  total) by mouth 4 (four) times daily -  with meals and at bedtime. 420 mL 1  . dexamethasone (DECADRON) 4 MG tablet Take 2 tablets (8 mg total) by mouth daily. Start the day after chemotherapy for 2 days. Take with food. (Patient not taking: Reported on 09/27/2017) 30 tablet 1  . lidocaine-prilocaine (EMLA) cream Apply to affected area once (Patient not taking: Reported on 09/27/2017) 30 g 3  . omeprazole (PRILOSEC) 20 MG capsule Take 1 capsule (20 mg total) by mouth 2 (two) times daily before a meal. (Patient not taking: Reported on 09/27/2017) 60 capsule 2  . ondansetron (ZOFRAN) 8 MG tablet Take 1 tablet (8 mg total) by mouth every 8 (eight) hours as needed for refractory nausea / vomiting. Start on day 3 after chemotherapy. (Patient not taking: Reported on 09/27/2017) 30 tablet 1  . prochlorperazine (COMPAZINE) 10 MG tablet Take 1 tablet (10 mg total) by mouth every 6 (six) hours as needed (Nausea or vomiting). (Patient not taking: Reported on 09/27/2017) 30 tablet 1  . traZODone (DESYREL) 50 MG tablet  TAKE 1 TABLET (50 MG TOTAL) BY MOUTH AT BEDTIME AS NEEDED FOR SLEEP. (Patient not taking: Reported on 09/27/2017) 30 tablet 0   No current facility-administered medications for this visit.     REVIEW OF SYSTEMS:    10 Point review of Systems was done is negative except as noted above.  PHYSICAL EXAMINATION:  ECOG PERFORMANCE STATUS: 2 - Symptomatic, <50% confined to bed  . Vitals:   09/27/17 1512  BP: (!) 131/52  Pulse: 63  Resp: 18  Temp: 97.8 F (36.6 C)  SpO2: 100%   Filed Weights   09/27/17 1512  Weight: 256 lb 8 oz (116.3 kg)   Body mass index is 35.77 kg/m.   GENERAL:alert, in no acute distress and comfortable SKIN: no acute rashes, no significant lesions EYES: conjunctiva are pink and non-injected, sclera anicteric OROPHARYNX: MMM, no exudates, no oropharyngeal erythema or ulceration NECK: supple, no JVD LYMPH:  no palpable lymphadenopathy in the cervical, axillary or  inguinal regions LUNGS: clear to auscultation b/l with normal respiratory effort HEART: regular rate & rhythm ABDOMEN:  normoactive bowel sounds, soft, non tender, not distended. Feeding tube site healed. No guarding. Extremity: no pedal edema PSYCH: alert & oriented x 3 with fluent speech NEURO: no focal motor/sensory deficits  LABORATORY DATA:  I have reviewed the data as listed  . CBC Latest Ref Rng & Units 09/27/2017 09/06/2017 08/16/2017  WBC 4.0 - 10.3 10e3/uL 2.9(L) 1.9(L) 2.8(L)  Hemoglobin 13.0 - 17.1 g/dL 11.5(L) 11.3(L) 12.0(L)  Hematocrit 38.4 - 49.9 % 36.0(L) 33.9(L) 36.5(L)  Platelets 140 - 400 10e3/uL 99(L) 103(L) 103(L)   ANC 1.2k . CMP Latest Ref Rng & Units 09/27/2017 09/06/2017 08/16/2017  Glucose 70 - 140 mg/dl 122 147(H) 159(H)  BUN 7.0 - 26.0 mg/dL 9.5 9.0 10.2  Creatinine 0.7 - 1.3 mg/dL 0.9 0.8 0.9  Sodium 136 - 145 mEq/L 141 139 141  Potassium 3.5 - 5.1 mEq/L 4.1 4.3 4.3  Chloride 101 - 111 mmol/L - - -  CO2 22 - 29 mEq/L 25 27 24   Calcium 8.4 - 10.4 mg/dL 9.4 9.2 9.9  Total Protein 6.4 - 8.3 g/dL 7.4 6.8 7.2  Total Bilirubin 0.20 - 1.20 mg/dL 0.69 0.69 0.82  Alkaline Phos 40 - 150 U/L 138 114 82  AST 5 - 34 U/L 33 33 27  ALT 0 - 55 U/L 21 19 17       RADIOGRAPHIC STUDIES: I have personally reviewed the radiological images as listed and agreed with the findings in the report. No results found.  ASSESSMENT & PLAN:   66 year old African-American male with  1) Resected Stage IV pT4a, pN3b, pM1 invasive moderately-poorly Gastric Adenocarcinoma . Her 2 neg by FISH LVI + margins neg 15/39 LN +ve, Extracapsular invasion +, some involvement of pancreatic capsule there pM1 Presented with severe pyloric stenosis with ulceration. S/p 60 pound weight loss- now resolved. S/p distal gastrectomy, cholecystectomy and en bloc dissection off the small part of the pancreatic head  with jejunal feeding tube placement. Initial CT abdomen and pelvis did not show  any local regional lymphadenopathy. Rpt CT chest/abd/pelvis from 03/22/2017 - no overt evidence of residual disease or metastatic disease. Post-surgical peripancreatic fluid collection resolving. CT chest/abd/pelvis 06/21/2017 after 6 cycles of FOLFOX -  No evidence of residual or metastatic carcinoma within the chest, abdomen, or pelvis -Started Chemoradiation on 07/18/17 with Xeloda (825mg /m2 twice daily Monday through Friday while on radiation) and completed treatment on 08/24/2017. -WBC at 2.9, PLT at 99, and  hemoglobin at 11.5 as of today, 09/27/2017.   #2 s/p moderate protein calorie malnutrition patient had lost about 60 pounds in the last 2-3 months but has been gaining back his lost weight well.  . 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  #4 status post port placement on 02/23/2017  -If he has NED status after completion of concurrent chemoradiation will consider port removal.   #5 recent Clostridium difficile colitis -. No overt issues with diarrhea currently. Has completed his prolonged po vancomycin course.   #6 suspected sleep apnea.Has not had a sleep study.Wife notes significant snoring which is important with weight loss. Will have to be careful with excessive sedation. 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 -Patient has no prohibitive toxicities from treatment at this time. -Sucralfate and acid suppressant to aid with acid reflux and radiation esophagitis   CT chest /Abd/pelvis in 6 weeks (1st week of Jan 2019) RTC with Dr Irene Limbo 1 week after CT with  labs in 1st week of Jan 2019   All of the patients questions were answered with apparent satisfaction. The patient knows to call the clinic with any problems, questions or concerns.  I spent 20 minutes counseling the patient face to face. The total time spent in the appointment was 25 minutes and more than 50% was on counseling and direct patient cares.   All of the patients questions were answered with apparent satisfaction. The patient knows to call the clinic with any problems, questions or concerns.    Sullivan Lone MD [place] AAHIVMS [facility] Hematology/Oncology Physician [facility]  (Office):       [phone] (Work cell):  [phone] (Fax):           [phone]   This document serves as a record of services personally performed by Sullivan Lone, MD. It was created on his behalf by Steva Colder, a trained medical scribe. The creation of this record is based on the scribe's personal observations and the provider's statements to them.   .I have reviewed the above documentation for accuracy and completeness, and I agree with the above. Brunetta Genera MD MS

## 2017-09-27 ENCOUNTER — Encounter: Payer: Self-pay | Admitting: Hematology

## 2017-09-27 ENCOUNTER — Ambulatory Visit (HOSPITAL_BASED_OUTPATIENT_CLINIC_OR_DEPARTMENT_OTHER): Payer: BLUE CROSS/BLUE SHIELD | Admitting: Hematology

## 2017-09-27 ENCOUNTER — Other Ambulatory Visit (HOSPITAL_BASED_OUTPATIENT_CLINIC_OR_DEPARTMENT_OTHER): Payer: Medicare Other

## 2017-09-27 VITALS — BP 131/52 | HR 63 | Temp 97.8°F | Resp 18 | Ht 71.0 in | Wt 256.5 lb

## 2017-09-27 DIAGNOSIS — C169 Malignant neoplasm of stomach, unspecified: Secondary | ICD-10-CM

## 2017-09-27 DIAGNOSIS — K2960 Other gastritis without bleeding: Secondary | ICD-10-CM | POA: Diagnosis not present

## 2017-09-27 DIAGNOSIS — E440 Moderate protein-calorie malnutrition: Secondary | ICD-10-CM

## 2017-09-27 DIAGNOSIS — D630 Anemia in neoplastic disease: Secondary | ICD-10-CM

## 2017-09-27 DIAGNOSIS — C160 Malignant neoplasm of cardia: Secondary | ICD-10-CM

## 2017-09-27 DIAGNOSIS — D709 Neutropenia, unspecified: Secondary | ICD-10-CM

## 2017-09-27 LAB — CBC & DIFF AND RETIC
BASO%: 0.7 % (ref 0.0–2.0)
Basophils Absolute: 0 10*3/uL (ref 0.0–0.1)
EOS%: 1.4 % (ref 0.0–7.0)
Eosinophils Absolute: 0 10*3/uL (ref 0.0–0.5)
HCT: 36 % — ABNORMAL LOW (ref 38.4–49.9)
HGB: 11.5 g/dL — ABNORMAL LOW (ref 13.0–17.1)
Immature Retic Fract: 7.1 % (ref 3.00–10.60)
LYMPH%: 13.8 % — ABNORMAL LOW (ref 14.0–49.0)
MCH: 28.1 pg (ref 27.2–33.4)
MCHC: 31.9 g/dL — ABNORMAL LOW (ref 32.0–36.0)
MCV: 88 fL (ref 79.3–98.0)
MONO#: 0.4 10*3/uL (ref 0.1–0.9)
MONO%: 12.1 % (ref 0.0–14.0)
NEUT#: 2.1 10*3/uL (ref 1.5–6.5)
NEUT%: 72 % (ref 39.0–75.0)
Platelets: 99 10*3/uL — ABNORMAL LOW (ref 140–400)
RBC: 4.09 10*6/uL — ABNORMAL LOW (ref 4.20–5.82)
RDW: 15.8 % — ABNORMAL HIGH (ref 11.0–14.6)
Retic %: 1.25 % (ref 0.80–1.80)
Retic Ct Abs: 51.13 10*3/uL (ref 34.80–93.90)
WBC: 2.9 10*3/uL — ABNORMAL LOW (ref 4.0–10.3)
lymph#: 0.4 10*3/uL — ABNORMAL LOW (ref 0.9–3.3)

## 2017-09-27 LAB — COMPREHENSIVE METABOLIC PANEL
ALT: 21 U/L (ref 0–55)
AST: 33 U/L (ref 5–34)
Albumin: 3.6 g/dL (ref 3.5–5.0)
Alkaline Phosphatase: 138 U/L (ref 40–150)
Anion Gap: 8 mEq/L (ref 3–11)
BUN: 9.5 mg/dL (ref 7.0–26.0)
CO2: 25 mEq/L (ref 22–29)
Calcium: 9.4 mg/dL (ref 8.4–10.4)
Chloride: 108 mEq/L (ref 98–109)
Creatinine: 0.9 mg/dL (ref 0.7–1.3)
EGFR: >60 mL/min/{1.73_m2} (ref >60)
Glucose: 122 mg/dl (ref 70–140)
Potassium: 4.1 mEq/L (ref 3.5–5.1)
Sodium: 141 mEq/L (ref 136–145)
Total Bilirubin: 0.69 mg/dL (ref 0.20–1.20)
Total Protein: 7.4 g/dL (ref 6.4–8.3)

## 2017-09-27 LAB — MAGNESIUM: Magnesium: 2.1 mg/dl (ref 1.5–2.5)

## 2017-09-27 NOTE — Patient Instructions (Signed)
Thank you for choosing [facility] to provide your oncology and hematology care.  To afford each patient quality time with our providers, please arrive 30 minutes before your scheduled appointment time.  If you arrive late for your appointment, you may be asked to reschedule.  We strive to give you quality time with our providers, and arriving late affects you and other patients whose appointments are after yours.   If you are a no show for multiple scheduled visits, you may be dismissed from the clinic at the providers discretion.    Again, thank you for choosing [facility], our hope is that these requests will decrease the amount of time that you wait before being seen by our physicians.  ______________________________________________________________________  Should you have questions after your visit to the [facility], please contact our office at [phone] between the hours of 8:30 and 4:30 p.m.    Voicemails left after 4:30p.m will not be returned until the following business day.    For prescription refill requests, please have your pharmacy contact us directly.  Please also try to allow 48 hours for prescription requests.    Please contact the scheduling department for questions regarding scheduling.  For scheduling of procedures such as PET scans, CT scans, MRI, Ultrasound, etc please contact central scheduling at [phone].    Resources For Cancer Patients and Caregivers:   Oncolink.org:  A wonderful resource for patients and healthcare providers for information regarding your disease, ways to tract your treatment, what to expect, etc.     American Cancer Society:  800-227-2345  Can help patients locate various types of support and financial assistance  Cancer Care: 1-800-813-HOPE (4673) Provides financial assistance, online support groups, medication/co-pay assistance.    [place] DSS:  336-641-3447 Where to apply for food  stamps, Medicaid, and utility assistance  Medicare Rights Center: 800-333-4114 Helps people with Medicare understand their rights and benefits, navigate the Medicare system, and secure the quality healthcare they deserve  SCAT: 336-333-6589 [place] [employer]'s shared-ride transportation service for eligible riders who have a disability that prevents them from riding the fixed route bus.    For additional information on assistance programs please contact our social worker:   Grier Hock/Abigail Elmore:  [phone]            

## 2017-09-28 ENCOUNTER — Encounter: Payer: Medicare Other | Admitting: Physical Therapy

## 2017-10-03 ENCOUNTER — Encounter: Payer: Medicare Other | Admitting: Physical Therapy

## 2017-10-04 ENCOUNTER — Telehealth: Payer: Self-pay | Admitting: Hematology

## 2017-10-04 NOTE — Telephone Encounter (Signed)
Scheduled appt per 11/14 los - sent reminder letter in the  Mail with appt date and time . Central radiology to contact patient with scan appts.

## 2017-10-09 ENCOUNTER — Encounter: Payer: Self-pay | Admitting: Radiation Oncology

## 2017-10-09 ENCOUNTER — Ambulatory Visit
Admission: RE | Admit: 2017-10-09 | Discharge: 2017-10-09 | Disposition: A | Discharge status: Home / Self Care | Payer: Medicare Other | Source: Ambulatory Visit | Attending: Radiation Oncology | Admitting: Radiation Oncology

## 2017-10-09 ENCOUNTER — Other Ambulatory Visit: Payer: Self-pay

## 2017-10-09 VITALS — BP 130/68 | HR 71 | Temp 97.6°F | Resp 20 | Ht 71.0 in | Wt 258.0 lb

## 2017-10-09 DIAGNOSIS — K219 Gastro-esophageal reflux disease without esophagitis: Secondary | ICD-10-CM | POA: Diagnosis not present

## 2017-10-09 DIAGNOSIS — Z800 Family history of malignant neoplasm of digestive organs: Secondary | ICD-10-CM | POA: Diagnosis not present

## 2017-10-09 DIAGNOSIS — C259 Malignant neoplasm of pancreas, unspecified: Secondary | ICD-10-CM | POA: Diagnosis not present

## 2017-10-09 DIAGNOSIS — Z9221 Personal history of antineoplastic chemotherapy: Secondary | ICD-10-CM | POA: Insufficient documentation

## 2017-10-09 DIAGNOSIS — R7303 Prediabetes: Secondary | ICD-10-CM | POA: Insufficient documentation

## 2017-10-09 DIAGNOSIS — Z79891 Long term (current) use of opiate analgesic: Secondary | ICD-10-CM | POA: Diagnosis not present

## 2017-10-09 DIAGNOSIS — Z9889 Other specified postprocedural states: Secondary | ICD-10-CM | POA: Insufficient documentation

## 2017-10-09 DIAGNOSIS — Z9049 Acquired absence of other specified parts of digestive tract: Secondary | ICD-10-CM | POA: Insufficient documentation

## 2017-10-09 DIAGNOSIS — C160 Malignant neoplasm of cardia: Secondary | ICD-10-CM | POA: Insufficient documentation

## 2017-10-09 DIAGNOSIS — I471 Supraventricular tachycardia: Secondary | ICD-10-CM | POA: Diagnosis not present

## 2017-10-09 DIAGNOSIS — Z79899 Other long term (current) drug therapy: Secondary | ICD-10-CM | POA: Insufficient documentation

## 2017-10-09 DIAGNOSIS — Z8042 Family history of malignant neoplasm of prostate: Secondary | ICD-10-CM | POA: Insufficient documentation

## 2017-10-09 DIAGNOSIS — Z510 Encounter for antineoplastic radiation therapy: Secondary | ICD-10-CM | POA: Diagnosis not present

## 2017-10-09 NOTE — Progress Notes (Signed)
Radiation Oncology         [phone] ________________________________  Name: [name] MRN: [id number]  Date of Service: 10/09/2017  DOB: Dec 19, 1950  Post Treatment Note  CC: [name], MD  Brunetta Genera, MD  Diagnosis: Stage IV, [phone] moderately-poorly differentiated invasive adenocarcinoma of the stomach.  Interval Since Last Radiation:  7 weeks   07/18/2017 - 08/25/2017: The stomach was treated initially to 45 Gy in 25 fractions, followed by a 5.4 Gy boost to yield a final total dose of 50.4 Gy.  Narrative:  The patient returns today for routine follow-up. During treatment he did very well with radiotherapy and did not have significant desquamation. He did not have significant nausea during treatment. He is scheduled to follow up with Dr. Irene Limbo in about 6 weeks for repeat staging scans, with the hopes of being followed in surveillance and having his PAC removed.                             On review of systems, the patient states he is doing pretty well overall. He had a nice Thanksgiving holiday and reports he was able to tolerate what he ate. He avoids oily or greasy foods, fast food, and focuses on cooked veggies and lean proteins. He will notice some dyspepsia and soreness in his mid abdomen and reports this has steadily improved since completing radiation. He denies any fevers, chills, nausea, or emesis when this occurs. He is no longer taking PPI therapy. No other complaints are noted.   ALLERGIES:  is allergic to no known allergies.  Meds: Current Outpatient Medications  Medication Sig Dispense Refill  . ferrous sulfate 325 (65 FE) MG tablet Take 325 mg by mouth daily with breakfast.    . lidocaine-prilocaine (EMLA) cream Apply to affected area once 30 g 3  . Multiple Vitamin (MULTIVITAMIN) tablet Take 1 tablet by mouth daily.    [name] Kitchen omeprazole (PRILOSEC) 20 MG capsule Take 1 capsule (20 mg total) by mouth 2 (two) times daily before a meal. 60 capsule  2  . ondansetron (ZOFRAN) 8 MG tablet Take 1 tablet (8 mg total) by mouth every 8 (eight) hours as needed for refractory nausea / vomiting. Start on day 3 after chemotherapy. (Patient not taking: Reported on 09/27/2017) 30 tablet 1  . prochlorperazine (COMPAZINE) 10 MG tablet Take 1 tablet (10 mg total) by mouth every 6 (six) hours as needed (Nausea or vomiting). (Patient not taking: Reported on 09/27/2017) 30 tablet 1  . sucralfate (CARAFATE) 1 GM/10ML suspension Take 10 mLs (1 g total) by mouth 4 (four) times daily -  with meals and at bedtime. (Patient not taking: Reported on 10/09/2017) 420 mL 1  . traZODone (DESYREL) 50 MG tablet TAKE 1 TABLET (50 MG TOTAL) BY MOUTH AT BEDTIME AS NEEDED FOR SLEEP. (Patient not taking: Reported on 09/27/2017) 30 tablet 0   No current facility-administered medications for this encounter.     Physical Findings:  height is 5\' 11"  (1.803 m) and weight is 258 lb (117 kg). His oral temperature is 97.6 F (36.4 C). His blood pressure is 130/68 and his pulse is 71. His respiration is 20 and oxygen saturation is 100%.  Pain Assessment Pain Score: 2  Pain Loc: Abdomen/10 In general this is a well appearing African American male in no acute distress. He's alert and oriented x4 and appropriate throughout the examination. Cardiopulmonary assessment is negative for acute distress and he  exhibits normal effort.   Lab Findings: Lab Results  Component Value Date   WBC 2.9 (L) 09/27/2017   HGB 11.5 (L) 09/27/2017   HCT 36.0 (L) 09/27/2017   MCV 88.0 09/27/2017   PLT 99 (L) 09/27/2017     Radiographic Findings: No results found.  Impression/Plan: 1. Stage IV, [id number] moderately-poorly differentiated invasive adenocarcinoma of the stomach. The patient appears to be recovering well from radiotherapy. He will proceed with restaging scans as outlined above and follow up with Dr. Irene Limbo. He was encouraged to resume his PPI therapy with omeprazole. He is in agreement to  try this and will let us know if he has any worsening of his symptoms. He will also follow up with Dr. Benson Norway in surveillance.     Carola Rhine, PAC

## 2017-11-15 ENCOUNTER — Other Ambulatory Visit (HOSPITAL_BASED_OUTPATIENT_CLINIC_OR_DEPARTMENT_OTHER): Payer: Medicare Other

## 2017-11-15 DIAGNOSIS — C163 Malignant neoplasm of pyloric antrum: Secondary | ICD-10-CM

## 2017-11-15 DIAGNOSIS — D630 Anemia in neoplastic disease: Secondary | ICD-10-CM | POA: Diagnosis not present

## 2017-11-15 DIAGNOSIS — C164 Malignant neoplasm of pylorus: Secondary | ICD-10-CM | POA: Diagnosis present

## 2017-11-15 LAB — CBC WITH DIFFERENTIAL/PLATELET
BASO%: 0.9 % (ref 0.0–2.0)
Basophils Absolute: 0 10*3/uL (ref 0.0–0.1)
EOS%: 2.5 % (ref 0.0–7.0)
Eosinophils Absolute: 0.1 10*3/uL (ref 0.0–0.5)
HCT: 32.8 % — ABNORMAL LOW (ref 38.4–49.9)
HGB: 10.9 g/dL — ABNORMAL LOW (ref 13.0–17.1)
LYMPH%: 9 % — ABNORMAL LOW (ref 14.0–49.0)
MCH: 28.1 pg (ref 27.2–33.4)
MCHC: 33.2 g/dL (ref 32.0–36.0)
MCV: 84.7 fL (ref 79.3–98.0)
MONO#: 0.4 10*3/uL (ref 0.1–0.9)
MONO%: 10.8 % (ref 0.0–14.0)
NEUT#: 2.7 10*3/uL (ref 1.5–6.5)
NEUT%: 76.8 % — ABNORMAL HIGH (ref 39.0–75.0)
Platelets: 143 10*3/uL (ref 140–400)
RBC: 3.88 10*6/uL — ABNORMAL LOW (ref 4.20–5.82)
RDW: 14.9 % — ABNORMAL HIGH (ref 11.0–14.6)
WBC: 3.6 10*3/uL — ABNORMAL LOW (ref 4.0–10.3)
lymph#: 0.3 10*3/uL — ABNORMAL LOW (ref 0.9–3.3)

## 2017-11-15 LAB — COMPREHENSIVE METABOLIC PANEL
ALT: 18 U/L (ref 0–55)
AST: 24 U/L (ref 5–34)
Albumin: 3.6 g/dL (ref 3.5–5.0)
Alkaline Phosphatase: 133 U/L (ref 40–150)
Anion Gap: 7 mEq/L (ref 3–11)
BUN: 11.8 mg/dL (ref 7.0–26.0)
CO2: 26 mEq/L (ref 22–29)
Calcium: 9.3 mg/dL (ref 8.4–10.4)
Chloride: 109 mEq/L (ref 98–109)
Creatinine: 0.9 mg/dL (ref 0.7–1.3)
EGFR: >60 mL/min/{1.73_m2} (ref >60)
Glucose: 124 mg/dl (ref 70–140)
Potassium: 4.1 mEq/L (ref 3.5–5.1)
Sodium: 142 mEq/L (ref 136–145)
Total Bilirubin: 0.5 mg/dL (ref 0.20–1.20)
Total Protein: 7 g/dL (ref 6.4–8.3)

## 2017-11-16 ENCOUNTER — Ambulatory Visit (HOSPITAL_COMMUNITY)
Admission: RE | Admit: 2017-11-16 | Discharge: 2017-11-16 | Disposition: A | Discharge status: Home / Self Care | Payer: Medicare Other | Source: Ambulatory Visit | Attending: Hematology | Admitting: Hematology

## 2017-11-16 DIAGNOSIS — D709 Neutropenia, unspecified: Secondary | ICD-10-CM | POA: Diagnosis not present

## 2017-11-16 DIAGNOSIS — K859 Acute pancreatitis without necrosis or infection, unspecified: Secondary | ICD-10-CM | POA: Diagnosis not present

## 2017-11-16 DIAGNOSIS — C160 Malignant neoplasm of cardia: Secondary | ICD-10-CM

## 2017-11-16 DIAGNOSIS — K2960 Other gastritis without bleeding: Secondary | ICD-10-CM | POA: Diagnosis not present

## 2017-11-16 DIAGNOSIS — I700 Atherosclerosis of aorta: Secondary | ICD-10-CM | POA: Diagnosis not present

## 2017-11-16 DIAGNOSIS — I2510 Atherosclerotic heart disease of native coronary artery without angina pectoris: Secondary | ICD-10-CM | POA: Diagnosis not present

## 2017-11-16 DIAGNOSIS — J90 Pleural effusion, not elsewhere classified: Secondary | ICD-10-CM | POA: Diagnosis not present

## 2017-11-16 DIAGNOSIS — R188 Other ascites: Secondary | ICD-10-CM | POA: Diagnosis not present

## 2017-11-16 MED ORDER — IOPAMIDOL (ISOVUE-300) INJECTION 61%
30.0000 mL | Freq: Once | INTRAVENOUS | Status: AC | PRN
Start: 2017-11-16 — End: 2017-11-16
  Administered 2017-11-16: 30 mL via ORAL

## 2017-11-16 MED ORDER — IOPAMIDOL (ISOVUE-300) INJECTION 61%
INTRAVENOUS | Status: AC
  Filled 2017-11-16: qty 100

## 2017-11-16 MED ORDER — IOPAMIDOL (ISOVUE-300) INJECTION 61%
100.0000 mL | Freq: Once | INTRAVENOUS | Status: AC | PRN
  Administered 2017-11-16: 100 mL via INTRAVENOUS

## 2017-11-16 MED ORDER — IOPAMIDOL (ISOVUE-300) INJECTION 61%
INTRAVENOUS | Status: AC
  Filled 2017-11-16: qty 30

## 2017-11-17 ENCOUNTER — Inpatient Hospital Stay: Payer: Medicare Other | Attending: Hematology | Admitting: Hematology

## 2017-11-17 ENCOUNTER — Encounter: Payer: Self-pay | Admitting: Hematology

## 2017-11-17 ENCOUNTER — Ambulatory Visit: Payer: Medicare Other

## 2017-11-17 ENCOUNTER — Other Ambulatory Visit: Payer: Self-pay

## 2017-11-17 VITALS — BP 125/62 | HR 67 | Temp 98.2°F | Resp 18 | Ht 71.0 in | Wt 259.8 lb

## 2017-11-17 DIAGNOSIS — C164 Malignant neoplasm of pylorus: Secondary | ICD-10-CM | POA: Diagnosis not present

## 2017-11-17 DIAGNOSIS — Z23 Encounter for immunization: Secondary | ICD-10-CM | POA: Diagnosis not present

## 2017-11-17 DIAGNOSIS — C160 Malignant neoplasm of cardia: Secondary | ICD-10-CM

## 2017-11-17 DIAGNOSIS — E440 Moderate protein-calorie malnutrition: Secondary | ICD-10-CM | POA: Diagnosis not present

## 2017-11-17 DIAGNOSIS — C169 Malignant neoplasm of stomach, unspecified: Secondary | ICD-10-CM

## 2017-11-17 DIAGNOSIS — K2960 Other gastritis without bleeding: Secondary | ICD-10-CM

## 2017-11-17 DIAGNOSIS — D630 Anemia in neoplastic disease: Secondary | ICD-10-CM | POA: Diagnosis not present

## 2017-11-17 MED ORDER — INFLUENZA VAC SPLIT QUAD 0.5 ML IM SUSY
0.5000 mL | PREFILLED_SYRINGE | Freq: Once | INTRAMUSCULAR | Status: AC
  Administered 2017-11-17: 0.5 mL via INTRAMUSCULAR
  Filled 2017-11-17: qty 0.5

## 2017-11-17 NOTE — Patient Instructions (Signed)
Thank you for choosing [facility] to provide your oncology and hematology care.  To afford each patient quality time with our providers, please arrive 30 minutes before your scheduled appointment time.  If you arrive late for your appointment, you may be asked to reschedule.  We strive to give you quality time with our providers, and arriving late affects you and other patients whose appointments are after yours.  If you are a no show for multiple scheduled visits, you may be dismissed from the clinic at the providers discretion.   Again, thank you for choosing [facility], our hope is that these requests will decrease the amount of time that you wait before being seen by our physicians.  ______________________________________________________________________ Should you have questions after your visit to the [facility], please contact our office at [phone] between the hours of 8:30 and 4:30 p.m.    Voicemails left after 4:30p.m will not be returned until the following business day.   For prescription refill requests, please have your pharmacy contact us directly.  Please also try to allow 48 hours for prescription requests.   Please contact the scheduling department for questions regarding scheduling.  For scheduling of procedures such as PET scans, CT scans, MRI, Ultrasound, etc please contact central scheduling at [phone].   Resources For Cancer Patients and Caregivers:  American Cancer Society:  800-227-2345  Can help patients locate various types of support and financial assistance Cancer Care: 1-800-813-HOPE (4673) Provides financial assistance, online support groups, medication/co-pay assistance.   [place] DSS:  336-641-3447 Where to apply for food stamps, Medicaid, and utility assistance Medicare Rights Center: 800-333-4114 Helps people with Medicare understand their rights and benefits, navigate the Medicare system, and secure the  quality healthcare they deserve SCAT: 336-333-6589 [place] [employer]'s shared-ride transportation service for eligible riders who have a disability that prevents them from riding the fixed route bus.   For additional information on assistance programs please contact our social worker:   Grier Hock/Abigail Elmore:  [phone] 

## 2017-11-17 NOTE — Progress Notes (Signed)
HEMATOLOGY/ONCOLOGY CLINIC NOTE  Date of Service: 11/17/17   Patient Care Team: Steven Mccreedy, MD as PCP - General (Internal Medicine) [name] (Surgery)    CHIEF COMPLAINTS/PURPOSE OF CONSULTATION:   f/u for gastric Adenocarcinoma.  HISTORY OF PRESENTING ILLNESS:   [name] is a wonderful 67 y.o. male is here for continued evaluation and management of recently diagnosed pT4a, pN3b, pM1 Gastric Adenocarcinoma.  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 Patient had presented to primary care physician with dyspeptic symptoms, partial gastric outlet obstruction and weight loss of 60 pounds. Patient has had a distal gastrectomy, cholecystectomy with jejunal feeding tube placement by Dr. Barry Dienes on 01/10/2017 for gastric obstruction secondary to adenocarcinoma of the gastric pylorus. He was noted to have invaded into the pancreas and a bit of the anterior pancreatic head was resected en bloc with the tumor.  Pathology showed  " INVASIVE MODERATELY TO POORLY DIFFERENTIATED ADENOCARCINOMA, SPANNING 4 CM IN GREATEST DIMENSION. TUMOR INVADES THROUGH STOMACH WALL TO INVOLVE SEROSAL SURFACE.  LYMPH/VASCULAR INVASION IS IDENTIFIED.  MARGINS ARE NEGATIVE.- FIFTEEN OF THIRTY NINE LYMPH NODES POSITIVE FOR METASTATIC ADENOCARCINOMA (15/39). EXTRACAPSULAR EXTENSION IS IDENTIFIED. 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  Patient had a prolonged hospitalization post surgery  due to significant NG tube output. Needed epidural and PCA for pain control. It took a while for him to be able to tolerate progression of 2 feeding. He had an abdominal drain due to a small collection near his pancreatic head. Was treated with Augmentin for this. Patient was in the hospital from 01/10/2017 and  was eventually discharged on 01/27/2017.  Patient postponed several attempts at posthospitalization follow-up. He has been recovering at home and has been optimizing his tube feeding. Patient was recently followed up by Dr. Barry Dienes and has had a port placement on 02/23/2017 and preparation for possible chemotherapy.  He notes that his tube feeding is up to 40 mL per hour for 12 hours and has targeted is 16 ml per hour. He was recently admitted to [facility] in [place] for a C. difficile colitis that caused a severe diarrhea. he is currently on oral vancomycin has completed about 5 days of treatment and is also on probiotics.  CURRENT THERAPY:  Concurrent Chemoradiation with Xeloda. He started 07/18/17  INTERVAL HISTORY  [name] is here for f/u following completion of his concurrent chemoradiation with Xeloda.   Of note since his last visit, pt underwent a CT CAP with results showing: IMPRESSION: 1. There is no masslike recurrence, but there is some increase in stranding in the upper abdominal central mesentery and omentum with some slight nodular component although without frank omental caking. Wall thickening in portions of the jejunum, along the gastrojejunostomy site, and in the transverse and ascending colon of uncertain etiology. Some of this could be explained by prior radiation therapy ; surveillance is suggested. 2. Some of the stranding is around the pancreatic head, and localized pancreatitis is not excluded although there is no pseudocyst or obvious acute peripancreatic fluid collections. 3. Trace ascites, primarily in the pelvis. 4. No findings of metastatic disease to the liver. Trace periportal edema. 5. Borderline prominent lymph node adjacent to the medial limb of the left adrenal gland. 6. Potential tethering of the jejunum to the anterior abdominal wall at the site of the prior jejunostomy. 7.  Other imaging findings of potential clinical significance: Coronary atherosclerosis. Aortic  Atherosclerosis (ICD10-I70.0). Bridging spurring of the sacroiliac joints.   He reports that he is doing well overall. His labs as of 11/15/2017 showed Hgb 10.9, WBC 3.6.  He states he is trying to remain physically active but feels the weather is affecting this and he plans to go to the gym. He states he has received the pneumococcal vaccine. He reports he is regaining his strength and intends to return to work soon.   On review of systems, pt reports pain to the center of the abdomen that worsens after eating, decreased taste and denies changes in BM, loss of appetite, fever, chills, night sweats and any other accompanying symptoms.  MEDICAL HISTORY:  Past Medical History:  Diagnosis Date  . Anemia   . Cancer [facility])    Gastric Adenocarcinoma  . Gastric outlet obstruction 01/2017  . GERD (gastroesophageal reflux disease)   . Pre-diabetes    denies  . SVT (supraventricular tachycardia) ([place])    during Admission and Surgery- 01/2017    SURGICAL HISTORY: Past Surgical History:  Procedure Laterality Date  . CHOLECYSTECTOMY N/A 01/10/2017   Procedure: CHOLECYSTECTOMY;  Surgeon: Stark Klein, MD;  Location: [place];  Service: General;  Laterality: N/A;  . COLONOSCOPY    . COLONOSCOPY  10/11/2011   Procedure: COLONOSCOPY;  Surgeon: Daneil Dolin, MD;  [facility];  Service: Endoscopy;  Laterality: N/A;  10:30 AM  . ESOPHAGOGASTRODUODENOSCOPY N/A 12/30/2016   Procedure: ESOPHAGOGASTRODUODENOSCOPY (EGD);  Surgeon: Carol Ada, MD;  Location: [facility];  Service: Endoscopy;  Laterality: N/A;  . GASTRECTOMY N/A 01/10/2017   Procedure: DISTAL GASTRECTOMY, JEJUNUM  FEEDING TUBE PLACEMENT;  Surgeon: Stark Klein, MD;  Location: [place];  Service: General;  Laterality: N/A;  . IR GENERIC HISTORICAL  01/24/2017   IR [place] DUODEN/JEJUNO TUBE PERCUT W/FLUORO 01/24/2017 Aletta Edouard, MD [facility]-INTERV RAD  . IR REPLC DUODEN/JEJUNO TUBE PERCUT W/FLUORO  03/26/2017  . PORTACATH PLACEMENT N/A 02/23/2017    Procedure: INSERTION PORT-A-CATH;  Surgeon: Stark Klein, MD;  Location: [place];  Service: General;  Laterality: N/A;  . testicle removed  1982    SOCIAL HISTORY: Social History   Socioeconomic History  . Marital status: Married    Spouse name: Not on file  . Number of children: Not on file  . Years of education: Not on file  . Highest education level: Not on file  Social Needs  . Financial resource strain: Not on file  . Food insecurity - worry: Not on file  . Food insecurity - inability: Not on file  . Transportation needs - medical: Not on file  . Transportation needs - non-medical: Not on file  Occupational History  . Not on file  Tobacco Use  . Smoking status: Never Smoker  . Smokeless tobacco: Never Used  Substance and Sexual Activity  . Alcohol use: No    Comment: occ beer or wine but none in last 6 months   . Drug use: No  . Sexual activity: Not on file  Other Topics Concern  . Not on file  Social History Narrative  . Not on file    FAMILY HISTORY: Family History  Problem Relation Age of Onset  . Prostate cancer Father   . Colon cancer Neg Hx     ALLERGIES:  is allergic to no known allergies.  MEDICATIONS:  Current Outpatient Medications  Medication Sig Dispense Refill  . ferrous sulfate 325 (65 FE) MG tablet  Take 325 mg by mouth daily with breakfast.    . lidocaine-prilocaine (EMLA) cream Apply to affected area once 30 g 3  . Multiple Vitamin (MULTIVITAMIN) tablet Take 1 tablet by mouth daily.    [name] Kitchen omeprazole (PRILOSEC) 20 MG capsule Take 1 capsule (20 mg total) by mouth 2 (two) times daily before a meal. 60 capsule 2  . ondansetron (ZOFRAN) 8 MG tablet Take 1 tablet (8 mg total) by mouth every 8 (eight) hours as needed for refractory nausea / vomiting. Start on day 3 after chemotherapy. 30 tablet 1  . prochlorperazine (COMPAZINE) 10 MG tablet Take 1 tablet (10 mg total) by mouth every 6 (six) hours as needed (Nausea or vomiting). 30 tablet 1  .  sucralfate (CARAFATE) 1 GM/10ML suspension Take 10 mLs (1 g total) by mouth 4 (four) times daily -  with meals and at bedtime. 420 mL 1  . traZODone (DESYREL) 50 MG tablet TAKE 1 TABLET (50 MG TOTAL) BY MOUTH AT BEDTIME AS NEEDED FOR SLEEP. 30 tablet 0   No current facility-administered medications for this visit.     REVIEW OF SYSTEMS:    10 Point review of Systems was done is negative except as noted above.  PHYSICAL EXAMINATION:  ECOG PERFORMANCE STATUS: 2 - Symptomatic, <50% confined to bed  . Vitals:   11/17/17 1057  BP: 125/62  Pulse: 67  Resp: 18  Temp: 98.2 F (36.8 C)  SpO2: 100%   Filed Weights   11/17/17 1057  Weight: 259 lb 12.8 oz (117.8 kg)   Body mass index is 36.23 kg/m.   GENERAL:alert, in no acute distress and comfortable SKIN: no acute rashes, no significant lesions EYES: conjunctiva are pink and non-injected, sclera anicteric OROPHARYNX: MMM, no exudates, no oropharyngeal erythema or ulceration NECK: supple, no JVD LYMPH:  no palpable lymphadenopathy in the cervical, axillary or inguinal regions LUNGS: clear to auscultation b/l with normal respiratory effort HEART: regular rate & rhythm ABDOMEN:  normoactive bowel sounds, soft, non tender, not distended. Feeding tube site healed. No guarding. Extremity: no pedal edema PSYCH: alert & oriented x 3 with fluent speech NEURO: no focal motor/sensory deficits  LABORATORY DATA:  I have reviewed the data as listed  . CBC Latest Ref Rng & Units 11/15/2017 09/27/2017 09/06/2017  WBC 4.0 - 10.3 10e3/uL 3.6(L) 2.9(L) 1.9(L)  Hemoglobin 13.0 - 17.1 g/dL 10.9(L) 11.5(L) 11.3(L)  Hematocrit 38.4 - 49.9 % 32.8(L) 36.0(L) 33.9(L)  Platelets 140 - 400 10e3/uL 143 99(L) 103(L)   ANC 1.2k . CMP Latest Ref Rng & Units 11/15/2017 09/27/2017 09/06/2017  Glucose 70 - 140 mg/dl 124 122 147(H)  BUN 7.0 - 26.0 mg/dL 11.8 9.5 9.0  Creatinine 0.7 - 1.3 mg/dL 0.9 0.9 0.8  Sodium 136 - 145 mEq/L 142 141 139  Potassium 3.5  - 5.1 mEq/L 4.1 4.1 4.3  Chloride 101 - 111 mmol/L - - -  CO2 22 - 29 mEq/L 26 25 27   Calcium 8.4 - 10.4 mg/dL 9.3 9.4 9.2  Total Protein 6.4 - 8.3 g/dL 7.0 7.4 6.8  Total Bilirubin 0.20 - 1.20 mg/dL 0.50 0.69 0.69  Alkaline Phos 40 - 150 U/L 133 138 114  AST 5 - 34 U/L 24 33 33  ALT 0 - 55 U/L 18 21 19       RADIOGRAPHIC STUDIES: I have personally reviewed the radiological images as listed and agreed with the findings in the report. Ct Chest W Contrast  Result Date: 11/16/2017 CLINICAL DATA:  Restaging and  assessment of response to therapy for gastric adenocarcinoma, prior chemotherapy and radiation therapy. EXAM: CT CHEST, ABDOMEN, AND PELVIS WITH CONTRAST TECHNIQUE: Multidetector CT imaging of the chest, abdomen and pelvis was performed following the standard protocol during bolus administration of intravenous contrast. CONTRAST:  178mL ISOVUE-300 IOPAMIDOL (ISOVUE-300) INJECTION 61%, 55mL ISOVUE-300 IOPAMIDOL (ISOVUE-300) INJECTION 61% COMPARISON:  Multiple exams, including 06/21/2017 FINDINGS: CT CHEST FINDINGS Cardiovascular: Left Port-A-Cath tip: SVC. Mild left anterior descending coronary artery atherosclerotic calcification. Mediastinum/Nodes: No pathologic adenopathy in the chest. Lungs/Pleura: Unremarkable Musculoskeletal: Thoracic spondylosis. Incidental lipoma along the superficial fascia margin of the left trapezius muscle. CT ABDOMEN PELVIS FINDINGS Hepatobiliary: Stable mild periportal edema. Cholecystectomy. Stranding of right upper quadrant fat planes slightly obscuring the extrahepatic biliary tree. Pancreas: Mild peripancreatic stranding most notable around the pancreatic head region, without acute peripancreatic fluid collection or pseudocyst. No findings of pancreatic abscess or necrosis. Spleen: Upper normal splenic size. Adrenals/Urinary Tract: Adrenal glands unremarkable. 0.9 cm hypodense exophytic lesion from the right kidney upper pole laterally, likely a cyst but  technically too small to categorize. Stomach/Bowel: Gastrojejunostomy with some wall thickening in the stomach and the jejunum at the gastrojejunostomy site, as well as stranding and slight nodularity in the surrounding omentum. The degree of stranding is slightly increased compared to the prior exam. Scattered air-fluid levels are present in nondilated but mildly thick-walled loops of jejunum. Oral contrast makes its way to the terminal ileum and nearly to the cecum. There is suspected wall thickening in the proximal colon and transverse colon. Scattered diverticula of the descending colon. Formed stool in the distal colon. Potential tethering of the proximal jejunum to the anterior abdominal wall in the vicinity of what appears to be a prior jejunostomy site. Vascular/Lymphatic: Aortoiliac atherosclerotic vascular disease. Borderline enlarged 10 mm in short axis relatively low-density lymph node adjacent to the medial limb of the left adrenal gland. Reproductive: Upper normal size prostate gland. Other: Small amount of free pelvic fluid. Mesenteric stranding especially in the upper abdomen along with upper abdominal omental stranding. Musculoskeletal: Bridging spurring of both sacroiliac joints. IMPRESSION: 1. There is no masslike recurrence, but there is some increase in stranding in the upper abdominal central mesentery and omentum with some slight nodular component although without frank omental caking. Wall thickening in portions of the jejunum, along the gastrojejunostomy site, and in the transverse and ascending colon of uncertain etiology. Some of this could be explained by prior radiation therapy ; surveillance is suggested. 2. Some of the stranding is around the pancreatic head, and localized pancreatitis is not excluded although there is no pseudocyst or obvious acute peripancreatic fluid collections. 3. Trace ascites, primarily in the pelvis. 4. No findings of metastatic disease to the liver. Trace  periportal edema. 5. Borderline prominent lymph node adjacent to the medial limb of the left adrenal gland. 6. Potential tethering of the jejunum to the anterior abdominal wall at the site of the prior jejunostomy. 7. Other imaging findings of potential clinical significance: Coronary atherosclerosis. Aortic Atherosclerosis (ICD10-I70.0). Bridging spurring of the sacroiliac joints. Electronically Signed   By: Van Clines M.D.   On: 11/16/2017 17:24   Ct Abdomen Pelvis W Contrast  Result Date: 11/16/2017 CLINICAL DATA:  Restaging and assessment of response to therapy for gastric adenocarcinoma, prior chemotherapy and radiation therapy. EXAM: CT CHEST, ABDOMEN, AND PELVIS WITH CONTRAST TECHNIQUE: Multidetector CT imaging of the chest, abdomen and pelvis was performed following the standard protocol during bolus administration of intravenous contrast. CONTRAST:  181mL ISOVUE-300 IOPAMIDOL (  ISOVUE-300) INJECTION 61%, 75mL ISOVUE-300 IOPAMIDOL (ISOVUE-300) INJECTION 61% COMPARISON:  Multiple exams, including 06/21/2017 FINDINGS: CT CHEST FINDINGS Cardiovascular: Left Port-A-Cath tip: SVC. Mild left anterior descending coronary artery atherosclerotic calcification. Mediastinum/Nodes: No pathologic adenopathy in the chest. Lungs/Pleura: Unremarkable Musculoskeletal: Thoracic spondylosis. Incidental lipoma along the superficial fascia margin of the left trapezius muscle. CT ABDOMEN PELVIS FINDINGS Hepatobiliary: Stable mild periportal edema. Cholecystectomy. Stranding of right upper quadrant fat planes slightly obscuring the extrahepatic biliary tree. Pancreas: Mild peripancreatic stranding most notable around the pancreatic head region, without acute peripancreatic fluid collection or pseudocyst. No findings of pancreatic abscess or necrosis. Spleen: Upper normal splenic size. Adrenals/Urinary Tract: Adrenal glands unremarkable. 0.9 cm hypodense exophytic lesion from the right kidney upper pole laterally, likely  a cyst but technically too small to categorize. Stomach/Bowel: Gastrojejunostomy with some wall thickening in the stomach and the jejunum at the gastrojejunostomy site, as well as stranding and slight nodularity in the surrounding omentum. The degree of stranding is slightly increased compared to the prior exam. Scattered air-fluid levels are present in nondilated but mildly thick-walled loops of jejunum. Oral contrast makes its way to the terminal ileum and nearly to the cecum. There is suspected wall thickening in the proximal colon and transverse colon. Scattered diverticula of the descending colon. Formed stool in the distal colon. Potential tethering of the proximal jejunum to the anterior abdominal wall in the vicinity of what appears to be a prior jejunostomy site. Vascular/Lymphatic: Aortoiliac atherosclerotic vascular disease. Borderline enlarged 10 mm in short axis relatively low-density lymph node adjacent to the medial limb of the left adrenal gland. Reproductive: Upper normal size prostate gland. Other: Small amount of free pelvic fluid. Mesenteric stranding especially in the upper abdomen along with upper abdominal omental stranding. Musculoskeletal: Bridging spurring of both sacroiliac joints. IMPRESSION: 1. There is no masslike recurrence, but there is some increase in stranding in the upper abdominal central mesentery and omentum with some slight nodular component although without frank omental caking. Wall thickening in portions of the jejunum, along the gastrojejunostomy site, and in the transverse and ascending colon of uncertain etiology. Some of this could be explained by prior radiation therapy ; surveillance is suggested. 2. Some of the stranding is around the pancreatic head, and localized pancreatitis is not excluded although there is no pseudocyst or obvious acute peripancreatic fluid collections. 3. Trace ascites, primarily in the pelvis. 4. No findings of metastatic disease to the liver.  Trace periportal edema. 5. Borderline prominent lymph node adjacent to the medial limb of the left adrenal gland. 6. Potential tethering of the jejunum to the anterior abdominal wall at the site of the prior jejunostomy. 7. Other imaging findings of potential clinical significance: Coronary atherosclerosis. Aortic Atherosclerosis (ICD10-I70.0). Bridging spurring of the sacroiliac joints. Electronically Signed   By: Van Clines M.D.   On: 11/16/2017 17:24    ASSESSMENT & PLAN:   67 year old African-American male with  1) Resected Stage IV pT4a, pN3b, pM1 invasive moderately-poorly Gastric Adenocarcinoma . Her 2 neg by FISH LVI + margins neg 15/39 LN +ve, Extracapsular invasion +, some involvement of pancreatic capsule there pM1 Presented with severe pyloric stenosis with ulceration. S/p 60 pound weight loss- now resolved. S/p distal gastrectomy, cholecystectomy and en bloc dissection off the small part of the pancreatic head  with jejunal feeding tube placement. Initial CT abdomen and pelvis did not show any local regional lymphadenopathy. Rpt CT chest/abd/pelvis from 03/22/2017 - no overt evidence of residual disease or metastatic disease. Post-surgical peripancreatic  fluid collection resolving. CT chest/abd/pelvis 06/21/2017 after 6 cycles of FOLFOX -  No evidence of residual or metastatic carcinoma within the chest, abdomen, or pelvis -Started Chemoradiation on 07/18/17 with Xeloda (825mg /m2 twice daily Monday through Friday while on radiation) and completed treatment on 08/24/2017.  CT chest/abd/pelvis - show radiation related changes but no evidence of tumor progression at this time.   #2 s/p moderate protein calorie malnutrition patient had lost about 60 pounds in the last 2-3 months but has been gaining back his lost weight well.  . 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  #4 status post port placement on 02/23/2017  -If he has NED status after completion of concurrent chemoradiation will consider port removal.   #5 recent Clostridium difficile colitis -. No overt issues with diarrhea currently. Has completed his prolonged po vancomycin course.   #6 suspected sleep apnea.Has not had a sleep study.Wife notes significant snoring which is important with weight loss. Will have to be careful with excessive sedation. 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 -no signigficant residual treatment related toxicities. -Sucralfate and acid suppressant to aid with acid reflux and radiation esophagitis -Influenza shot today, Prevnar on next clinic fu   Influenza shot today RTC with Dr Irene Limbo in 2 months with labs   All of the patients questions were answered with apparent satisfaction. The patient knows to call the clinic with any problems, questions or concerns.  I spent 20 minutes counseling the patient face to face. The total time spent in the appointment was 20 minutes and more than 50% was on counseling and direct patient cares.  All of the patients questions were answered with apparent satisfaction. The patient knows to call the clinic with any problems, questions or concerns.    Sullivan Lone MD [place] AAHIVMS [facility] Hematology/Oncology Physician [facility]  (Office):       [phone] (Work cell):  [phone] (Fax):           [phone]   This document serves as a record of services personally performed by Sullivan Lone, MD. It was created on his behalf by Alean Rinne, a trained medical scribe. The creation of this record is based on the scribe's personal  observations and the provider's statements to them.   .I have reviewed the above documentation for accuracy and completeness, and I agree with the above. Brunetta Genera MD MS

## 2017-11-22 ENCOUNTER — Telehealth: Payer: Self-pay | Admitting: Hematology

## 2017-11-22 NOTE — Telephone Encounter (Signed)
Left message re March appointment. Schedule mailed.

## 2018-01-15 ENCOUNTER — Ambulatory Visit: Payer: Medicare Other | Admitting: Hematology

## 2018-01-15 ENCOUNTER — Other Ambulatory Visit: Payer: Medicare Other

## 2018-01-15 NOTE — Progress Notes (Signed)
HEMATOLOGY/ONCOLOGY CLINIC NOTE  Date of Service: 01/16/18   Patient Care Team: [name], MD as PCP - General (Internal Medicine) [name] (Surgery)    CHIEF COMPLAINTS/PURPOSE OF CONSULTATION:   F/u for continued management of gastric adenocarcinoma  HISTORY OF PRESENTING ILLNESS:   [name] is a wonderful 67 y.o. male is here for continued evaluation and management of recently diagnosed pT4a, pN3b, pM1 Gastric Adenocarcinoma.  Patient was previously seen in the hospital on 01/19/2017 with a diagnosis of gastric adenocarcinoma in the gastric pylorus causing significant intrinsic stenosis . Patient had presented to primary care physician with dyspeptic symptoms, partial gastric outlet obstruction and weight loss of 60 pounds. Patient has had a distal gastrectomy, cholecystectomy with jejunal feeding tube placement by Dr. Barry Dienes on 01/10/2017 for gastric obstruction secondary to adenocarcinoma of the gastric pylorus. He was noted to have invaded into the pancreas and a bit of the anterior pancreatic head was resected en bloc with the tumor.  Pathology showed  " INVASIVE MODERATELY TO POORLY DIFFERENTIATED ADENOCARCINOMA, SPANNING 4 CM IN GREATEST DIMENSION. TUMOR INVADES THROUGH STOMACH WALL TO INVOLVE SEROSAL SURFACE.  LYMPH/VASCULAR INVASION IS IDENTIFIED.  MARGINS ARE NEGATIVE.- FIFTEEN OF THIRTY NINE LYMPH NODES POSITIVE FOR METASTATIC ADENOCARCINOMA (15/39). EXTRACAPSULAR EXTENSION IS IDENTIFIED. 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  Patient had a prolonged hospitalization post surgery  due to significant NG tube output. Needed epidural and PCA for pain control. It took a while for him to be able to tolerate progression of 2 feeding. He had an abdominal drain due to a small collection near his pancreatic head. Was treated with Augmentin for this. Patient was in the hospital  from 01/10/2017 and was eventually discharged on 01/27/2017.  Patient postponed several attempts at posthospitalization follow-up. He has been recovering at home and has been optimizing his tube feeding. Patient was recently followed up by Dr. Barry Dienes and has had a port placement on 02/23/2017 and preparation for possible chemotherapy.  He notes that his tube feeding is up to 40 mL per hour for 12 hours and has targeted is 16 ml per hour. He was recently admitted to [facility] in [place] for a C. difficile colitis that caused a severe diarrhea. he is currently on oral vancomycin has completed about 5 days of treatment and is also on probiotics.  PREVIOUS THERAPY:  Concurrent Chemoradiation with Xeloda. He started 07/18/17 and completed 08/25/17  INTERVAL HISTORY   [name] is here for f/u for his gastric adenocarcinoma. He presents to the clinic today noting he has been feeling good most of the time. His energy is better since ending treatment. He notices what foods to stay away from as he cannot tolerate them as much. He went back to work part time at [employer] yesterday and notes that he is feeling positive.  On review of symptoms, pt notes improved energy. Pt denies black stool or blood in stool and denies abdominal pain.  No other acute new focal symptoms.  MEDICAL HISTORY:  Past Medical History:  Diagnosis Date  . Anemia   . Cancer [facility])    Gastric Adenocarcinoma  . Gastric outlet obstruction 01/2017  . GERD (gastroesophageal reflux disease)   . Pre-diabetes    denies  . SVT (supraventricular tachycardia) ([place])    during Admission and Surgery- 01/2017    SURGICAL HISTORY: Past Surgical History:  Procedure Laterality Date  . CHOLECYSTECTOMY N/A 01/10/2017   Procedure: CHOLECYSTECTOMY;  Surgeon: Stark Klein, MD;  Location: [place];  Service: General;  Laterality: N/A;  . COLONOSCOPY    . COLONOSCOPY  10/11/2011   Procedure: COLONOSCOPY;  Surgeon: Daneil Dolin, MD;  [facility];  Service: Endoscopy;  Laterality: N/A;  10:30 AM  . ESOPHAGOGASTRODUODENOSCOPY N/A 12/30/2016   Procedure: ESOPHAGOGASTRODUODENOSCOPY (EGD);  Surgeon: Carol Ada, MD;  Location: [facility];  Service: Endoscopy;  Laterality: N/A;  . GASTRECTOMY N/A 01/10/2017   Procedure: DISTAL GASTRECTOMY, JEJUNUM  FEEDING TUBE PLACEMENT;  Surgeon: Stark Klein, MD;  Location: [place];  Service: General;  Laterality: N/A;  . IR GENERIC HISTORICAL  01/24/2017   IR [place] DUODEN/JEJUNO TUBE PERCUT W/FLUORO 01/24/2017 Aletta Edouard, MD [facility]-INTERV RAD  . IR REPLC DUODEN/JEJUNO TUBE PERCUT W/FLUORO  03/26/2017  . PORTACATH PLACEMENT N/A 02/23/2017   Procedure: INSERTION PORT-A-CATH;  Surgeon: Stark Klein, MD;  Location: [place];  Service: General;  Laterality: N/A;  . testicle removed  1982    SOCIAL HISTORY: Social History   Socioeconomic History  . Marital status: Married    Spouse name: Not on file  . Number of children: Not on file  . Years of education: Not on file  . Highest education level: Not on file  Social Needs  . Financial resource strain: Not on file  . Food insecurity - worry: Not on file  . Food insecurity - inability: Not on file  . Transportation needs - medical: Not on file  . Transportation needs - non-medical: Not on file  Occupational History  . Not on file  Tobacco Use  . Smoking status: Never Smoker  . Smokeless tobacco: Never Used  Substance and Sexual Activity  . Alcohol use: No    Comment: occ beer or wine but none in last 6 months   . Drug use: No  . Sexual activity: Not on file  Other Topics Concern  . Not on file  Social History Narrative  . Not on file    FAMILY HISTORY: Family History  Problem Relation Age of Onset  . Prostate cancer Father   . Colon cancer Neg Hx     ALLERGIES:  is allergic to no known allergies.  MEDICATIONS:  Current Outpatient Medications  Medication Sig Dispense Refill  . ferrous sulfate 325 (65 FE) MG tablet Take 325 mg by  mouth daily with breakfast.    . lidocaine-prilocaine (EMLA) cream Apply to affected area once 30 g 3  . Multiple Vitamin (MULTIVITAMIN) tablet Take 1 tablet by mouth daily.    [name] Kitchen omeprazole (PRILOSEC) 20 MG capsule Take 1 capsule (20 mg total) by mouth 2 (two) times daily before a meal. 60 capsule 2  . ondansetron (ZOFRAN) 8 MG tablet Take 1 tablet (8 mg total) by mouth every 8 (eight) hours as needed for refractory nausea / vomiting. Start on day 3 after chemotherapy. 30 tablet 1  . prochlorperazine (COMPAZINE) 10 MG tablet Take 1 tablet (10 mg total) by mouth every 6 (six) hours as needed (Nausea or vomiting). 30 tablet 1  . sucralfate (CARAFATE) 1 GM/10ML suspension Take 10 mLs (1 g total) by mouth 4 (four) times daily -  with meals and at bedtime. 420 mL 1  . traZODone (DESYREL) 50 MG tablet TAKE 1 TABLET (50 MG TOTAL) BY MOUTH AT BEDTIME AS NEEDED FOR SLEEP. 30 tablet 0   No current facility-administered medications for this visit.     REVIEW OF SYSTEMS:   .10 Point review of Systems was done  is negative except as noted above.   PHYSICAL EXAMINATION:  ECOG PERFORMANCE STATUS: 2 - Symptomatic, <50% confined to bed  . Vitals:   01/16/18 1347  BP: 133/64  Pulse: 65  Resp: (!) 24  Temp: 97.7 F (36.5 C)  SpO2: 100%   Filed Weights   01/16/18 1347  Weight: 263 lb 1.6 oz (119.3 kg)   Body mass index is 36.7 kg/m.  [name] Kitchen GENERAL:alert, in no acute distress and comfortable SKIN: no acute rashes, no significant lesions EYES: conjunctiva are pink and non-injected, sclera anicteric OROPHARYNX: MMM, no exudates, no oropharyngeal erythema or ulceration NECK: supple, no JVD LYMPH:  no palpable lymphadenopathy in the cervical, axillary or inguinal regions LUNGS: clear to auscultation b/l with normal respiratory effort HEART: regular rate & rhythm ABDOMEN:  normoactive bowel sounds , non tender, not distended. Extremity: no pedal edema PSYCH: alert & oriented x 3 with fluent  speech NEURO: no focal motor/sensory deficits   LABORATORY DATA:  I have reviewed the data as listed  . CBC Latest Ref Rng & Units 01/16/2018 11/15/2017 09/27/2017  WBC 4.0 - 10.3 K/uL 3.7(L) 3.6(L) 2.9(L)  Hemoglobin 13.0 - 17.1 g/dL 11.4(L) 10.9(L) 11.5(L)  Hematocrit 38.4 - 49.9 % 35.5(L) 32.8(L) 36.0(L)  Platelets 140 - 400 K/uL 153 143 99(L)    . CMP Latest Ref Rng & Units 01/16/2018 11/15/2017 09/27/2017  Glucose 70 - 140 mg/dL 110 124 122  BUN 7 - 26 mg/dL 15 11.8 9.5  Creatinine 0.70 - 1.30 mg/dL 0.97 0.9 0.9  Sodium 136 - 145 mmol/L 140 142 141  Potassium 3.5 - 5.1 mmol/L 4.6 4.1 4.1  Chloride 98 - 109 mmol/L 107 - -  CO2 22 - 29 mmol/L 26 26 25   Calcium 8.4 - 10.4 mg/dL 9.8 9.3 9.4  Total Protein 6.4 - 8.3 g/dL 7.3 7.0 7.4  Total Bilirubin 0.2 - 1.2 mg/dL 0.5 0.50 0.69  Alkaline Phos 40 - 150 U/L 101 133 138  AST 5 - 34 U/L 22 24 33  ALT 0 - 55 U/L 18 18 21       RADIOGRAPHIC STUDIES: I have personally reviewed the radiological images as listed and agreed with the findings in the report. No results found.  ASSESSMENT & PLAN:   67 year old African-American male with  1) Resected Stage IV pT4a, pN3b, pM1 invasive moderately-poorly Gastric Adenocarcinoma . Her 2 neg by FISH LVI + margins neg 15/39 LN +ve, Extracapsular invasion +, some involvement of pancreatic capsule there pM1 Presented with severe pyloric stenosis with ulceration. S/p 60 pound weight loss- now resolved. S/p distal gastrectomy, cholecystectomy and en bloc dissection off the small part of the pancreatic head  with jejunal feeding tube placement. Initial CT abdomen and pelvis did not show any local regional lymphadenopathy. Rpt CT chest/abd/pelvis from 03/22/2017 - no overt evidence of residual disease or metastatic disease. Post-surgical peripancreatic fluid collection resolving. CT chest/abd/pelvis 06/21/2017 after 6 cycles of FOLFOX -  No evidence of residual or metastatic carcinoma within the chest,  abdomen, or pelvis -Started Chemoradiation on 07/18/17 with Xeloda (825mg /m2 twice daily Monday through Friday while on radiation) and completed treatment on 08/24/2017.  CT chest/abd/pelvis (11/16/2017) - show radiation related changes but no evidence of tumor progression at this time.   #2 s/p moderate protein calorie malnutrition patient had lost about 60 pounds in the last 2-3 months but has been gaining back his lost weight well and is back to the weigh that he would like to be at. 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 Hg at 11.4 today   #4 status post port placement on 02/23/2017 by dr. Barry Dienes  -He is NED, Plan to remove PAC soon   -will message Dr Barry Dienes to consider PAC removal.  #5 h/o Clostridium difficile colitis -. No overt issues with diarrhea currently.   #6 suspected sleep apnea.Has not had a sleep study.Wife notes significant snoring which is important with weight loss. Will have to be careful with excessive sedation. -continue prn low dose trazodone for insomnia -recommended he f/u with his PCP for consideration of sleep study-pending  #7 Radiation Gastritis -resolved   Plan  -no significant residual treatment related toxicities. -His next surveillance scan is in 3 months  -I discussed the option of keeping PAC until next scan. Pt notes he would like to proceed with PAC removal sooner than later. Will send note to Dr. Barry Dienes about port removal.  -Sucralfate and acid suppressant to aid with acid reflux and radiation esophagitis -Will continue surveillance with CT Scan every other visit and f/u every 3-4 months for the first 3 years.  -I encouraged him to remain active and positive -Influenza shot received 09/27/2017, Prevnar vaccination today (had received pneumovax  01/2017)   -f/u with Dr Barry Dienes for [facility] a cath removal -RTC with Dr Irene Limbo in 3 months with labs Prevnar vaccination today     All of the patients questions were answered with apparent satisfaction. The patient knows to call the clinic with any problems, questions or concerns.  . The total time spent in the appointment was 20 minutes and more than 50% was on counseling and direct patient cares.   All of the patients questions were answered with apparent satisfaction. The patient knows to call the clinic with any problems, questions or concerns.    Sullivan Lone MD [place] AAHIVMS [facility] Hematology/Oncology Physician [facility]  (Office):       [phone] (Work cell):  [phone] (Fax):           [phone]   This document serves as a record of services personally performed by Sullivan Lone, MD. It was created on his behalf by Joslyn Devon, a trained medical scribe. The creation of this record is based on the scribe's personal observations and the provider's statements to them.    .I have reviewed the above documentation for accuracy and completeness, and I agree with the above. Brunetta Genera MD MS

## 2018-01-16 ENCOUNTER — Inpatient Hospital Stay (HOSPITAL_BASED_OUTPATIENT_CLINIC_OR_DEPARTMENT_OTHER): Payer: Medicare Other | Admitting: Hematology

## 2018-01-16 ENCOUNTER — Inpatient Hospital Stay: Payer: Medicare Other | Attending: Hematology

## 2018-01-16 ENCOUNTER — Telehealth: Payer: Self-pay

## 2018-01-16 VITALS — BP 133/64 | HR 65 | Temp 97.7°F | Resp 24 | Ht 71.0 in | Wt 263.1 lb

## 2018-01-16 DIAGNOSIS — Z452 Encounter for adjustment and management of vascular access device: Secondary | ICD-10-CM | POA: Insufficient documentation

## 2018-01-16 DIAGNOSIS — Z23 Encounter for immunization: Secondary | ICD-10-CM | POA: Diagnosis not present

## 2018-01-16 DIAGNOSIS — C160 Malignant neoplasm of cardia: Secondary | ICD-10-CM

## 2018-01-16 DIAGNOSIS — C163 Malignant neoplasm of pyloric antrum: Secondary | ICD-10-CM

## 2018-01-16 DIAGNOSIS — D630 Anemia in neoplastic disease: Secondary | ICD-10-CM | POA: Diagnosis not present

## 2018-01-16 DIAGNOSIS — C169 Malignant neoplasm of stomach, unspecified: Secondary | ICD-10-CM

## 2018-01-16 LAB — CBC WITH DIFFERENTIAL/PLATELET
Basophils Absolute: 0 10*3/uL (ref 0.0–0.1)
Basophils Relative: 1 %
Eosinophils Absolute: 0 10*3/uL (ref 0.0–0.5)
Eosinophils Relative: 1 %
HCT: 35.5 % — ABNORMAL LOW (ref 38.4–49.9)
Hemoglobin: 11.4 g/dL — ABNORMAL LOW (ref 13.0–17.1)
Lymphocytes Relative: 14 %
Lymphs Abs: 0.5 10*3/uL — ABNORMAL LOW (ref 0.9–3.3)
MCH: 26.5 pg — ABNORMAL LOW (ref 27.2–33.4)
MCHC: 32.1 g/dL (ref 32.0–36.0)
MCV: 82.5 fL (ref 79.3–98.0)
Monocytes Absolute: 0.4 10*3/uL (ref 0.1–0.9)
Monocytes Relative: 10 %
Neutro Abs: 2.7 10*3/uL (ref 1.5–6.5)
Neutrophils Relative %: 74 %
Platelets: 153 10*3/uL (ref 140–400)
RBC: 4.3 MIL/uL (ref 4.20–5.82)
RDW: 15.3 % — ABNORMAL HIGH (ref 11.0–14.6)
WBC: 3.7 10*3/uL — ABNORMAL LOW (ref 4.0–10.3)

## 2018-01-16 LAB — RETICULOCYTES
RBC.: 4.22 MIL/uL (ref 4.20–5.82)
Retic Count, Absolute: 38 10*3/uL (ref 34.8–93.9)
Retic Ct Pct: 0.9 % (ref 0.8–1.8)

## 2018-01-16 LAB — COMPREHENSIVE METABOLIC PANEL
ALT: 18 U/L (ref 0–55)
AST: 22 U/L (ref 5–34)
Albumin: 3.8 g/dL (ref 3.5–5.0)
Alkaline Phosphatase: 101 U/L (ref 40–150)
Anion gap: 7 (ref 3–11)
BUN: 15 mg/dL (ref 7–26)
CO2: 26 mmol/L (ref 22–29)
Calcium: 9.8 mg/dL (ref 8.4–10.4)
Chloride: 107 mmol/L (ref 98–109)
Creatinine, Ser: 0.97 mg/dL (ref 0.70–1.30)
GFR calc Af Amer: >60 mL/min (ref >60)
GFR calc non Af Amer: >60 mL/min (ref >60)
Glucose, Bld: 110 mg/dL (ref 70–140)
Potassium: 4.6 mmol/L (ref 3.5–5.1)
Sodium: 140 mmol/L (ref 136–145)
Total Bilirubin: 0.5 mg/dL (ref 0.2–1.2)
Total Protein: 7.3 g/dL (ref 6.4–8.3)

## 2018-01-16 MED ORDER — PNEUMOCOCCAL 13-VAL CONJ VACC IM SUSP
0.5000 mL | Freq: Once | INTRAMUSCULAR | Status: AC
  Administered 2018-01-16: 0.5 mL via INTRAMUSCULAR
  Filled 2018-01-16: qty 0.5

## 2018-01-16 MED ORDER — PNEUMOCOCCAL 13-VAL CONJ VACC IM SUSP: 0.5000 mL | Freq: Once | INTRAMUSCULAR | Status: DC

## 2018-01-16 NOTE — Patient Instructions (Signed)
Pneumococcal Conjugate Vaccine suspension for injection What is this medicine? PNEUMOCOCCAL VACCINE (NEU mo KOK al vak SEEN) is a vaccine used to prevent pneumococcus bacterial infections. These bacteria can cause serious infections like pneumonia, meningitis, and blood infections. This vaccine will lower your chance of getting pneumonia. If you do get pneumonia, it can make your symptoms milder and your illness shorter. This vaccine will not treat an infection and will not cause infection. This vaccine is recommended for infants and young children, adults with certain medical conditions, and adults 65 years or older. This medicine may be used for other purposes; ask your health care provider or pharmacist if you have questions. COMMON BRAND NAME(S): Prevnar, Prevnar 13 What should I tell my health care provider before I take this medicine? They need to know if you have any of these conditions: -bleeding problems -fever -immune system problems -an unusual or allergic reaction to pneumococcal vaccine, diphtheria toxoid, other vaccines, latex, other medicines, foods, dyes, or preservatives -pregnant or trying to get pregnant -breast-feeding How should I use this medicine? This vaccine is for injection into a muscle. It is given by a health care professional. A copy of Vaccine Information Statements will be given before each vaccination. Read this sheet carefully each time. The sheet may change frequently. Talk to your pediatrician regarding the use of this medicine in children. While this drug may be prescribed for children as young as 6 weeks old for selected conditions, precautions do apply. Overdosage: If you think you have taken too much of this medicine contact a poison control center or emergency room at once. NOTE: This medicine is only for you. Do not share this medicine with others. What if I miss a dose? It is important not to miss your dose. Call your doctor or health care professional  if you are unable to keep an appointment. What may interact with this medicine? -medicines for cancer chemotherapy -medicines that suppress your immune function -steroid medicines like prednisone or cortisone This list may not describe all possible interactions. Give your health care provider a list of all the medicines, herbs, non-prescription drugs, or dietary supplements you use. Also tell them if you smoke, drink alcohol, or use illegal drugs. Some items may interact with your medicine. What should I watch for while using this medicine? Mild fever and pain should go away in 3 days or less. Report any unusual symptoms to your doctor or health care professional. What side effects may I notice from receiving this medicine? Side effects that you should report to your doctor or health care professional as soon as possible: -allergic reactions like skin rash, itching or hives, swelling of the face, lips, or tongue -breathing problems -confused -fast or irregular heartbeat -fever over 102 degrees F -seizures -unusual bleeding or bruising -unusual muscle weakness Side effects that usually do not require medical attention (report to your doctor or health care professional if they continue or are bothersome): -aches and pains -diarrhea -fever of 102 degrees F or less -headache -irritable -loss of appetite -pain, tender at site where injected -trouble sleeping This list may not describe all possible side effects. Call your doctor for medical advice about side effects. You may report side effects to FDA at 1-800-FDA-1088. Where should I keep my medicine? This does not apply. This vaccine is given in a clinic, pharmacy, doctor's office, or other health care setting and will not be stored at home. NOTE: This sheet is a summary. It may not cover all possible information.   If you have questions about this medicine, talk to your doctor, pharmacist, or health care provider.  2018 Elsevier/Gold  Standard (2014-08-07 10:27:27)  

## 2018-01-16 NOTE — Telephone Encounter (Signed)
No los per 3/5. 

## 2018-01-17 LAB — CEA (IN HOUSE-CHCC): CEA (CHCC-In House): 1.86 ng/mL (ref 0.00–5.00)

## 2018-01-26 ENCOUNTER — Telehealth: Payer: Self-pay

## 2018-01-26 NOTE — Telephone Encounter (Signed)
Pt called and left VM this afternoon regarding f/u appt and port removal not yet scheduled. Pt had received a call notifying him of appointment on 01/30/18 for f/u with Dr. Irene Limbo. Pt verbalized, "I thought that I was supposed to f/u in three months with Dr. Irene Limbo." Reviewed LOS and discussed with Dr. Irene Limbo. Pt appointments for 01/30/18 cancelled and scheduling message sent to have 3 month f/u with labs scheduled. Order not yet placed for port removal. Reminder sent to Dr. Irene Limbo to place order. Pt notified of appointment cancellation and why he has not yet been called for port removal appointment from Dr. Marlowe Aschoff office.

## 2018-01-26 NOTE — Telephone Encounter (Signed)
Per Dr. Irene Limbo, order not required for port removal if being performed by [facility]. Called the office back at [phone] to leave a message for Dr. Barry Dienes that Dr. Irene Limbo has given permission to remove pt port. Currently, Dr. Barry Dienes is out of the office for a week. Notified pt that office will get in touch, but that it will probably be the week after next before he will receive a call. Pt verbalized understanding and thanks for the information.

## 2018-01-29 ENCOUNTER — Telehealth: Payer: Self-pay

## 2018-01-29 ENCOUNTER — Telehealth: Payer: Self-pay | Admitting: Hematology

## 2018-01-29 ENCOUNTER — Other Ambulatory Visit: Payer: Medicare Other

## 2018-01-29 ENCOUNTER — Ambulatory Visit: Payer: Medicare Other | Admitting: Hematology

## 2018-01-29 NOTE — Telephone Encounter (Signed)
Scheduled appt per 3/18 sch msg - spoke with patient regarding appt.

## 2018-01-29 NOTE — Telephone Encounter (Signed)
(  Provider message to schedule appointment in 3 months). Left a detailed voice message of the upcoming appointment. Per 3/7 in message

## 2018-01-30 ENCOUNTER — Other Ambulatory Visit: Payer: Medicare Other

## 2018-01-30 ENCOUNTER — Ambulatory Visit: Payer: Medicare Other | Admitting: Hematology

## 2018-01-31 ENCOUNTER — Telehealth: Payer: Self-pay | Admitting: Hematology

## 2018-01-31 NOTE — Telephone Encounter (Signed)
Patient called to reschedule  °

## 2018-02-02 ENCOUNTER — Inpatient Hospital Stay: Payer: Medicare Other

## 2018-02-02 VITALS — BP 128/62 | HR 70 | Temp 97.9°F | Resp 20

## 2018-02-02 DIAGNOSIS — Z95828 Presence of other vascular implants and grafts: Secondary | ICD-10-CM

## 2018-02-02 DIAGNOSIS — Z23 Encounter for immunization: Secondary | ICD-10-CM | POA: Diagnosis not present

## 2018-02-02 DIAGNOSIS — Z452 Encounter for adjustment and management of vascular access device: Secondary | ICD-10-CM | POA: Diagnosis not present

## 2018-02-02 DIAGNOSIS — C160 Malignant neoplasm of cardia: Secondary | ICD-10-CM

## 2018-02-02 DIAGNOSIS — C163 Malignant neoplasm of pyloric antrum: Secondary | ICD-10-CM | POA: Diagnosis not present

## 2018-02-02 DIAGNOSIS — D630 Anemia in neoplastic disease: Secondary | ICD-10-CM | POA: Diagnosis not present

## 2018-02-02 MED ORDER — HEPARIN SOD (PORK) LOCK FLUSH 100 UNIT/ML IV SOLN
500.0000 [IU] | Freq: Once | INTRAVENOUS | Status: AC | PRN
Start: 2018-02-02 — End: 2018-02-02
  Administered 2018-02-02: 500 [IU] via INTRAVENOUS
  Filled 2018-02-02: qty 5

## 2018-02-02 MED ORDER — SODIUM CHLORIDE 0.9% FLUSH
10.0000 mL | INTRAVENOUS | Status: DC | PRN
  Administered 2018-02-02: 10 mL via INTRAVENOUS
  Filled 2018-02-02: qty 10

## 2018-02-02 NOTE — Patient Instructions (Signed)
Implanted Port Insertion  Implanted port insertion is a procedure to put in a port and catheter. The port is a device with an injectable disk that can be accessed by your health care provider. The port is connected to a vein in the chest or neck by a small flexible tube (catheter). There are different types of ports. The implanted port may be used as a long-term IV access for:  · Medicines, such as chemotherapy.  · Fluids.  · Liquid nutrition, such as total parenteral nutrition (TPN).  · Blood samples.    Having a port means that your health care provider will not need to use the veins in your arms for these procedures.  Tell a health care provider about:  · Any allergies you have.  · All medicines you are taking, especially blood thinners, as well as any vitamins, herbs, eye drops, creams, over-the-counter medicines, and steroids.  · Any problems you or family members have had with anesthetic medicines.  · Any blood disorders you have.  · Any surgeries you have had.  · Any medical conditions you have, including diabetes or kidney problems.  · Whether you are pregnant or may be pregnant.  What are the risks?  Generally, this is a safe procedure. However, problems may occur, including:  · Allergic reactions to medicines or dyes.  · Damage to other structures or organs.  · Infection.  · Damage to the blood vessel, bruising, or bleeding at the puncture site.  · Blood clot.  · Breakdown of the skin over the port.  · A collection of air in the chest that can cause one of the lungs to collapse (pneumothorax). This is rare.    What happens before the procedure?  Staying hydrated  Follow instructions from your health care provider about hydration, which may include:  · Up to 2 hours before the procedure - you may continue to drink clear liquids, such as water, clear fruit juice, black coffee, and plain tea.    Eating and drinking restrictions  · Follow instructions from your health care provider about eating and drinking,  which may include:  ? 8 hours before the procedure - stop eating heavy meals or foods such as meat, fried foods, or fatty foods.  ? 6 hours before the procedure - stop eating light meals or foods, such as toast or cereal.  ? 6 hours before the procedure - stop drinking milk or drinks that contain milk.  ? 2 hours before the procedure - stop drinking clear liquids.  Medicines  · Ask your health care provider about:  ? Changing or stopping your regular medicines. This is especially important if you are taking diabetes medicines or blood thinners.  ? Taking medicines such as aspirin and ibuprofen. These medicines can thin your blood. Do not take these medicines before your procedure if your health care provider instructs you not to.  · You may be given antibiotic medicine to help prevent infection.  General instructions  · Plan to have someone take you home from the hospital or clinic.  · If you will be going home right after the procedure, plan to have someone with you for 24 hours.  · You may have blood tests.  · You may be asked to shower with a germ-killing soap.  What happens during the procedure?  · To lower your risk of infection:  ? Your health care team will wash or sanitize their hands.  ? Your skin will be washed with   soap.  ? Hair may be removed from the surgical area.  · An IV tube will be inserted into one of your veins.  · You will be given one or more of the following:  ? A medicine to help you relax (sedative).  ? A medicine to numb the area (local anesthetic).  · Two small cuts (incisions) will be made to insert the port.  ? One incision will be made in your neck to get access to the vein where the catheter will lie.  ? The other incision will be made in the upper chest. This is where the port will lie.  · The procedure may be done using continuous X-ray (fluoroscopy) or other imaging tools for guidance.  · The port and catheter will be placed. There may be a small, raised area where the port  is.  · The port will be flushed with a salt solution (saline), and blood will be drawn to make sure that it is working correctly.  · The incisions will be closed.  · Bandages (dressings) may be placed over the incisions.  The procedure may vary among health care providers and hospitals.  What happens after the procedure?  · Your blood pressure, heart rate, breathing rate, and blood oxygen level will be monitored until the medicines you were given have worn off.  · Do not drive for 24 hours if you were given a sedative.  · You will be given a manufacturer's information card for the type of port that you have. Keep this with you.  · Your port will need to be flushed and checked as told by your health care provider, usually every few weeks.  · A chest X-ray will be done to:  ? Check the placement of the port.  ? Make sure there is no injury to your lung.  Summary  · Implanted port insertion is a procedure to put in a port and catheter.  · The implanted port is used as a long-term IV access.  · The port will need to be flushed and checked as told by your health care provider, usually every few weeks.  · Keep your manufacturer's information card with you at all times.  This information is not intended to replace advice given to you by your health care provider. Make sure you discuss any questions you have with your health care provider.  Document Released: 08/21/2013 Document Revised: 09/21/2016 Document Reviewed: 09/21/2016  Elsevier Interactive Patient Education © 2017 [employer]

## 2018-04-17 NOTE — Progress Notes (Signed)
HEMATOLOGY/ONCOLOGY CLINIC NOTE  Date of Service: 04/19/18   Patient Care Team: [name], MD as PCP - General (Internal Medicine) [name] (Surgery)    CHIEF COMPLAINTS/PURPOSE OF CONSULTATION:   F/u for continued management of gastric adenocarcinoma  HISTORY OF PRESENTING ILLNESS:   [name] is a wonderful 67 y.o. male is here for continued evaluation and management of recently diagnosed pT4a, pN3b, pM1 Gastric Adenocarcinoma.  Patient was previously seen in the hospital on 01/19/2017 with a diagnosis of gastric adenocarcinoma in the gastric pylorus causing significant intrinsic stenosis . Patient had presented to primary care physician with dyspeptic symptoms, partial gastric outlet obstruction and weight loss of 60 pounds. Patient has had a distal gastrectomy, cholecystectomy with jejunal feeding tube placement by Dr. Barry Dienes on 01/10/2017 for gastric obstruction secondary to adenocarcinoma of the gastric pylorus. He was noted to have invaded into the pancreas and a bit of the anterior pancreatic head was resected en bloc with the tumor.  Pathology showed  " INVASIVE MODERATELY TO POORLY DIFFERENTIATED ADENOCARCINOMA, SPANNING 4 CM IN GREATEST DIMENSION. TUMOR INVADES THROUGH STOMACH WALL TO INVOLVE SEROSAL SURFACE.  LYMPH/VASCULAR INVASION IS IDENTIFIED.  MARGINS ARE NEGATIVE.- FIFTEEN OF THIRTY NINE LYMPH NODES POSITIVE FOR METASTATIC ADENOCARCINOMA (15/39). EXTRACAPSULAR EXTENSION IS IDENTIFIED. 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  Patient had a prolonged hospitalization post surgery  due to significant NG tube output. Needed epidural and PCA for pain control. It took a while for him to be able to tolerate progression of 2 feeding. He had an abdominal drain due to a small collection near his pancreatic head. Was treated with Augmentin for this. Patient was in the hospital  from 01/10/2017 and was eventually discharged on 01/27/2017.  Patient postponed several attempts at posthospitalization follow-up. He has been recovering at home and has been optimizing his tube feeding. Patient was recently followed up by Dr. Barry Dienes and has had a port placement on 02/23/2017 and preparation for possible chemotherapy.  He notes that his tube feeding is up to 40 mL per hour for 12 hours and has targeted is 16 ml per hour. He was recently admitted to [facility] in [place] for a C. difficile colitis that caused a severe diarrhea. he is currently on oral vancomycin has completed about 5 days of treatment and is also on probiotics.  PREVIOUS THERAPY:  Concurrent Chemoradiation with Xeloda. He started 07/18/17 and completed 08/25/17  INTERVAL HISTORY   [name] is here for f/u for his gastric adenocarcinoma. The patient's last visit with [name] was on 01/16/18. The pt reports that he is doing well overall.   The pt reports that he has been keeping very busy with yard work and vacations around the country. He notes that he does not have any abdominal symptoms and has been able to determine a diet that minimizes abdominal pain and discomfort.   He notes some continuing numbness in the tips of his fingers, and describes that the numbness is beginning to dissipate more and more.   Lab results today (04/19/18) of CBC, CMP, and Reticulocytes is as follows: all values are WNL except for HGB at 11.7, HCT at 36.2, MCH at 27.0, RDW at 15.9, Lymphs Abs at 0.8k. CEA 04/19/18 is wnl at 2.27  On review of systems, pt reports normal bowel movements, weight gain, improving neuropathy, eating well, and denies abdominal pain, diarrhea, constipation, back pain, pain along the spine, and any other symptoms.  MEDICAL HISTORY:  Past Medical History:  Diagnosis Date  . Anemia   . Cancer [facility])    Gastric Adenocarcinoma  . Gastric outlet obstruction 01/2017  . GERD (gastroesophageal reflux disease)   .  Pre-diabetes    denies  . SVT (supraventricular tachycardia) ([place])    during Admission and Surgery- 01/2017    SURGICAL HISTORY: Past Surgical History:  Procedure Laterality Date  . CHOLECYSTECTOMY N/A 01/10/2017   Procedure: CHOLECYSTECTOMY;  Surgeon: Stark Klein, MD;  Location: [place];  Service: General;  Laterality: N/A;  . COLONOSCOPY    . COLONOSCOPY  10/11/2011   Procedure: COLONOSCOPY;  Surgeon: Daneil Dolin, MD;  [facility];  Service: Endoscopy;  Laterality: N/A;  10:30 AM  . ESOPHAGOGASTRODUODENOSCOPY N/A 12/30/2016   Procedure: ESOPHAGOGASTRODUODENOSCOPY (EGD);  Surgeon: Carol Ada, MD;  Location: [facility];  Service: Endoscopy;  Laterality: N/A;  . GASTRECTOMY N/A 01/10/2017   Procedure: DISTAL GASTRECTOMY, JEJUNUM  FEEDING TUBE PLACEMENT;  Surgeon: Stark Klein, MD;  Location: [place];  Service: General;  Laterality: N/A;  . IR GENERIC HISTORICAL  01/24/2017   IR [place] DUODEN/JEJUNO TUBE PERCUT W/FLUORO 01/24/2017 Aletta Edouard, MD [facility]-INTERV RAD  . IR REPLC DUODEN/JEJUNO TUBE PERCUT W/FLUORO  03/26/2017  . PORTACATH PLACEMENT N/A 02/23/2017   Procedure: INSERTION PORT-A-CATH;  Surgeon: Stark Klein, MD;  Location: [place];  Service: General;  Laterality: N/A;  . testicle removed  1982    SOCIAL HISTORY: Social History   Socioeconomic History  . Marital status: Married    Spouse name: Not on file  . Number of children: Not on file  . Years of education: Not on file  . Highest education level: Not on file  Occupational History  . Not on file  Social Needs  . Financial resource strain: Not on file  . Food insecurity:    Worry: Not on file    Inability: Not on file  . Transportation needs:    Medical: Not on file    Non-medical: Not on file  Tobacco Use  . Smoking status: Never Smoker  . Smokeless tobacco: Never Used  Substance and Sexual Activity  . Alcohol use: No    Comment: occ beer or wine but none in last 6 months   . Drug use: No  . Sexual  activity: Not on file  Lifestyle  . Physical activity:    Days per week: Not on file    Minutes per session: Not on file  . Stress: Not on file  Relationships  . Social connections:    Talks on phone: Not on file    Gets together: Not on file    Attends religious service: Not on file    Active member of club or organization: Not on file    Attends meetings of clubs or organizations: Not on file    Relationship status: Not on file  . Intimate partner violence:    Fear of current or ex partner: Not on file    Emotionally abused: Not on file    Physically abused: Not on file    Forced sexual activity: Not on file  Other Topics Concern  . Not on file  Social History Narrative  . Not on file    FAMILY HISTORY: Family History  Problem Relation Age of Onset  . Prostate cancer Father   . Colon cancer Neg Hx     ALLERGIES:  is allergic to no known allergies.  MEDICATIONS:  Current Outpatient Medications  Medication  Sig Dispense Refill  . ferrous sulfate 325 (65 FE) MG tablet Take 325 mg by mouth daily with breakfast.    . lidocaine-prilocaine (EMLA) cream Apply to affected area once 30 g 3  . Multiple Vitamin (MULTIVITAMIN) tablet Take 1 tablet by mouth daily.    [name] Kitchen omeprazole (PRILOSEC) 20 MG capsule Take 1 capsule (20 mg total) by mouth 2 (two) times daily before a meal. (Patient not taking: Reported on 04/19/2018) 60 capsule 2  . ondansetron (ZOFRAN) 8 MG tablet Take 1 tablet (8 mg total) by mouth every 8 (eight) hours as needed for refractory nausea / vomiting. Start on day 3 after chemotherapy. (Patient not taking: Reported on 04/19/2018) 30 tablet 1  . prochlorperazine (COMPAZINE) 10 MG tablet Take 1 tablet (10 mg total) by mouth every 6 (six) hours as needed (Nausea or vomiting). (Patient not taking: Reported on 04/19/2018) 30 tablet 1  . sucralfate (CARAFATE) 1 GM/10ML suspension Take 10 mLs (1 g total) by mouth 4 (four) times daily -  with meals and at bedtime. (Patient not  taking: Reported on 04/19/2018) 420 mL 1  . traZODone (DESYREL) 50 MG tablet TAKE 1 TABLET (50 MG TOTAL) BY MOUTH AT BEDTIME AS NEEDED FOR SLEEP. (Patient not taking: Reported on 04/19/2018) 30 tablet 0   No current facility-administered medications for this visit.     REVIEW OF SYSTEMS:   A 10+ POINT REVIEW OF SYSTEMS WAS OBTAINED including neurology, dermatology, psychiatry, cardiac, respiratory, lymph, extremities, GI, GU, Musculoskeletal, constitutional, breasts, reproductive, HEENT.  All pertinent positives are noted in the HPI.  All others are negative.    PHYSICAL EXAMINATION:  ECOG PERFORMANCE STATUS: 2 - Symptomatic, <50% confined to bed  . Vitals:   04/19/18 1541  BP: (!) 136/57  Pulse: (!) 55  Resp: 18  Temp: 97.9 F (36.6 C)  SpO2: 100%   Filed Weights   04/19/18 1541  Weight: 268 lb 6.4 oz (121.7 kg)   Body mass index is 37.43 kg/m.   GENERAL:alert, in no acute distress and comfortable SKIN: no acute rashes, no significant lesions EYES: conjunctiva are pink and non-injected, sclera anicteric OROPHARYNX: MMM, no exudates, no oropharyngeal erythema or ulceration NECK: supple, no JVD LYMPH:  no palpable lymphadenopathy in the cervical, axillary or inguinal regions LUNGS: clear to auscultation b/l with normal respiratory effort HEART: regular rate & rhythm ABDOMEN:  normoactive bowel sounds , non tender, not distended. Extremity: no pedal edema PSYCH: alert & oriented x 3 with fluent speech NEURO: no focal motor/sensory deficits   LABORATORY DATA:  I have reviewed the data as listed  . CBC Latest Ref Rng & Units 04/19/2018 01/16/2018 11/15/2017  WBC 4.0 - 10.3 K/uL 4.6 3.7(L) 3.6(L)  Hemoglobin 13.0 - 17.1 g/dL 11.7(L) 11.4(L) 10.9(L)  Hematocrit 38.4 - 49.9 % 36.2(L) 35.5(L) 32.8(L)  Platelets 140 - 400 K/uL 145 153 143    . CMP Latest Ref Rng & Units 04/19/2018 01/16/2018 11/15/2017  Glucose 70 - 140 mg/dL 112 110 124  BUN 7 - 26 mg/dL 15 15 11.8  Creatinine 0.70  - 1.30 mg/dL 1.09 0.97 0.9  Sodium 136 - 145 mmol/L 143 140 142  Potassium 3.5 - 5.1 mmol/L 4.2 4.6 4.1  Chloride 98 - 109 mmol/L 108 107 -  CO2 22 - 29 mmol/L 28 26 26   Calcium 8.4 - 10.4 mg/dL 9.5 9.8 9.3  Total Protein 6.4 - 8.3 g/dL 7.3 7.3 7.0  Total Bilirubin 0.2 - 1.2 mg/dL 0.6 0.5 0.50  Alkaline  Phos 40 - 150 U/L 98 101 133  AST 5 - 34 U/L 18 22 24   ALT 0 - 55 U/L 12 18 18       RADIOGRAPHIC STUDIES: I have personally reviewed the radiological images as listed and agreed with the findings in the report. No results found.  ASSESSMENT & PLAN:   67 y.o.  African-American male with  1) Resected Stage IV pT4a, pN3b, pM1 invasive moderately-poorly Gastric Adenocarcinoma . Her 2 neg by FISH LVI + margins neg 15/39 LN +ve, Extracapsular invasion +, some involvement of pancreatic capsule there pM1 Presented with severe pyloric stenosis with ulceration. S/p 60 pound weight loss- now resolved. S/p distal gastrectomy, cholecystectomy and en bloc dissection off the small part of the pancreatic head  with jejunal feeding tube placement. Initial CT abdomen and pelvis did not show any local regional lymphadenopathy. Rpt CT chest/abd/pelvis from 03/22/2017 - no overt evidence of residual disease or metastatic disease. Post-surgical peripancreatic fluid collection resolving. CT chest/abd/pelvis 06/21/2017 after 6 cycles of FOLFOX -  No evidence of residual or metastatic carcinoma within the chest, abdomen, or pelvis -Started Chemoradiation on 07/18/17 with Xeloda (825mg /m2 twice daily Monday through Friday while on radiation) and completed treatment on 08/24/2017.  CT chest/abd/pelvis (11/16/2017) - show radiation related changes but no evidence of tumor progression at this time.   #2 s/p moderate protein calorie malnutrition patient had lost about 60 pounds in the last 2-3 months but has been gaining back his lost weight well and is back to the weigh that he would like to be at. . 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 Hg at 11.4 today   #4 status post port placement on 02/23/2017 by dr. Barry Dienes   #5 h/o Clostridium difficile colitis -. No overt issues with diarrhea currently.   #6 suspected sleep apnea.Has not had a sleep study.Wife notes significant snoring which is important with weight loss. Will have to be careful with excessive sedation. -continue prn low dose trazodone for insomnia -recommended he f/u with his PCP for consideration of sleep study-pending  #7 Radiation Gastritis -resolved   Plan  -Discussed pt labwork today, 04/19/18; blood counts and chemistries are stable, CEA is wnl 2.27 -The pt shows no clinical or lab progression of his adenocarcinoma at this time.  -No indication for further treatment at this time.   -Will continue surveillance with CT Scan every other visit and f/u every 3-4 months for the first 3 years.  -Influenza shot received 09/27/2017, Prevnar vaccination January (had received pneumovax 01/2017) -Will see pt back in 3 months with repeat CT    Ct chest/abd/pelvis in 12 weeks RTC with Dr Irene Limbo in 3 months with labs      All of the patients questions were answered with apparent satisfaction. The patient knows to call the clinic with any problems, questions or concerns.   The toal time spent in the appt was 20 minutes and more than 50% was on counseling and direct patient cares.    All of the patients questions were answered with apparent satisfaction. The patient knows to call the clinic with any problems, questions or concerns.    Sullivan Lone MD [place] AAHIVMS [facility] Hematology/Oncology Physician [place]  (Office):  [phone] (Work cell):  [phone] (Fax):           [phone]   I, Baldwin [place],  am acting as a scribe for Dr Irene Limbo.   .I have reviewed the above documentation for accuracy and completeness, and I agree with the above. Brunetta Genera MD

## 2018-04-19 ENCOUNTER — Encounter: Payer: Self-pay | Admitting: Hematology

## 2018-04-19 ENCOUNTER — Inpatient Hospital Stay (HOSPITAL_BASED_OUTPATIENT_CLINIC_OR_DEPARTMENT_OTHER): Payer: Medicare Other | Admitting: Hematology

## 2018-04-19 ENCOUNTER — Inpatient Hospital Stay: Payer: Medicare Other | Attending: Hematology

## 2018-04-19 VITALS — BP 136/57 | HR 55 | Temp 97.9°F | Resp 18 | Ht 71.0 in | Wt 268.4 lb

## 2018-04-19 DIAGNOSIS — E440 Moderate protein-calorie malnutrition: Secondary | ICD-10-CM

## 2018-04-19 DIAGNOSIS — C163 Malignant neoplasm of pyloric antrum: Secondary | ICD-10-CM | POA: Diagnosis not present

## 2018-04-19 DIAGNOSIS — D630 Anemia in neoplastic disease: Secondary | ICD-10-CM | POA: Diagnosis not present

## 2018-04-19 DIAGNOSIS — C160 Malignant neoplasm of cardia: Secondary | ICD-10-CM

## 2018-04-19 LAB — CMP (CANCER CENTER ONLY)
ALT: 12 U/L (ref 0–55)
AST: 18 U/L (ref 5–34)
Albumin: 4 g/dL (ref 3.5–5.0)
Alkaline Phosphatase: 98 U/L (ref 40–150)
Anion gap: 7 (ref 3–11)
BUN: 15 mg/dL (ref 7–26)
CO2: 28 mmol/L (ref 22–29)
Calcium: 9.5 mg/dL (ref 8.4–10.4)
Chloride: 108 mmol/L (ref 98–109)
Creatinine: 1.09 mg/dL (ref 0.70–1.30)
GFR, Est AFR Am: >60 mL/min (ref >60)
GFR, Estimated: >60 mL/min (ref >60)
Glucose, Bld: 112 mg/dL (ref 70–140)
Potassium: 4.2 mmol/L (ref 3.5–5.1)
Sodium: 143 mmol/L (ref 136–145)
Total Bilirubin: 0.6 mg/dL (ref 0.2–1.2)
Total Protein: 7.3 g/dL (ref 6.4–8.3)

## 2018-04-19 LAB — CBC WITH DIFFERENTIAL (CANCER CENTER ONLY)
Basophils Absolute: 0 10*3/uL (ref 0.0–0.1)
Basophils Relative: 0 %
Eosinophils Absolute: 0 10*3/uL (ref 0.0–0.5)
Eosinophils Relative: 1 %
HCT: 36.2 % — ABNORMAL LOW (ref 38.4–49.9)
Hemoglobin: 11.7 g/dL — ABNORMAL LOW (ref 13.0–17.1)
Lymphocytes Relative: 17 %
Lymphs Abs: 0.8 10*3/uL — ABNORMAL LOW (ref 0.9–3.3)
MCH: 27 pg — ABNORMAL LOW (ref 27.2–33.4)
MCHC: 32.3 g/dL (ref 32.0–36.0)
MCV: 83.6 fL (ref 79.3–98.0)
Monocytes Absolute: 0.4 10*3/uL (ref 0.1–0.9)
Monocytes Relative: 8 %
Neutro Abs: 3.4 10*3/uL (ref 1.5–6.5)
Neutrophils Relative %: 74 %
Platelet Count: 145 10*3/uL (ref 140–400)
RBC: 4.33 MIL/uL (ref 4.20–5.82)
RDW: 15.9 % — ABNORMAL HIGH (ref 11.0–14.6)
WBC Count: 4.6 10*3/uL (ref 4.0–10.3)

## 2018-04-19 LAB — RETICULOCYTES
RBC.: 4.33 MIL/uL (ref 4.20–5.82)
Retic Count, Absolute: 39 10*3/uL (ref 34.8–93.9)
Retic Ct Pct: 0.9 % (ref 0.8–1.8)

## 2018-04-20 LAB — CEA (IN HOUSE-CHCC): CEA (CHCC-In House): 2.27 ng/mL (ref 0.00–5.00)

## 2018-07-13 ENCOUNTER — Ambulatory Visit (HOSPITAL_COMMUNITY)
Admission: RE | Admit: 2018-07-13 | Discharge: 2018-07-13 | Disposition: A | Discharge status: Home / Self Care | Payer: Medicare Other | Source: Ambulatory Visit | Attending: Hematology | Admitting: Hematology

## 2018-07-13 ENCOUNTER — Encounter (HOSPITAL_COMMUNITY): Payer: Self-pay

## 2018-07-13 ENCOUNTER — Inpatient Hospital Stay: Payer: Medicare Other | Attending: Hematology

## 2018-07-13 DIAGNOSIS — C160 Malignant neoplasm of cardia: Secondary | ICD-10-CM | POA: Diagnosis not present

## 2018-07-13 DIAGNOSIS — C169 Malignant neoplasm of stomach, unspecified: Secondary | ICD-10-CM | POA: Diagnosis not present

## 2018-07-13 LAB — CBC WITH DIFFERENTIAL/PLATELET
Basophils Absolute: 0 10*3/uL (ref 0.0–0.1)
Basophils Relative: 1 %
Eosinophils Absolute: 0.1 10*3/uL (ref 0.0–0.5)
Eosinophils Relative: 2 %
HCT: 36.2 % — ABNORMAL LOW (ref 38.4–49.9)
Hemoglobin: 12 g/dL — ABNORMAL LOW (ref 13.0–17.1)
Lymphocytes Relative: 16 %
Lymphs Abs: 0.5 10*3/uL — ABNORMAL LOW (ref 0.9–3.3)
MCH: 28 pg (ref 27.2–33.4)
MCHC: 33.2 g/dL (ref 32.0–36.0)
MCV: 84.5 fL (ref 79.3–98.0)
Monocytes Absolute: 0.3 10*3/uL (ref 0.1–0.9)
Monocytes Relative: 10 %
Neutro Abs: 2.3 10*3/uL (ref 1.5–6.5)
Neutrophils Relative %: 71 %
Platelets: 130 10*3/uL — ABNORMAL LOW (ref 140–400)
RBC: 4.29 MIL/uL (ref 4.20–5.82)
RDW: 14.9 % — ABNORMAL HIGH (ref 11.0–14.6)
WBC: 3.2 10*3/uL — ABNORMAL LOW (ref 4.0–10.3)

## 2018-07-13 LAB — CMP (CANCER CENTER ONLY)
ALT: 16 U/L (ref 0–44)
AST: 22 U/L (ref 15–41)
Albumin: 3.9 g/dL (ref 3.5–5.0)
Alkaline Phosphatase: 89 U/L (ref 38–126)
Anion gap: 9 (ref 5–15)
BUN: 16 mg/dL (ref 8–23)
CO2: 27 mmol/L (ref 22–32)
Calcium: 9.3 mg/dL (ref 8.9–10.3)
Chloride: 106 mmol/L (ref 98–111)
Creatinine: 1.01 mg/dL (ref 0.61–1.24)
GFR, Est AFR Am: >60 mL/min (ref >60)
GFR, Estimated: >60 mL/min (ref >60)
Glucose, Bld: 113 mg/dL — ABNORMAL HIGH (ref 70–99)
Potassium: 4.2 mmol/L (ref 3.5–5.1)
Sodium: 142 mmol/L (ref 135–145)
Total Bilirubin: 0.6 mg/dL (ref 0.3–1.2)
Total Protein: 7.2 g/dL (ref 6.5–8.1)

## 2018-07-13 LAB — CEA (IN HOUSE-CHCC): CEA (CHCC-In House): 2.53 ng/mL (ref 0.00–5.00)

## 2018-07-13 MED ORDER — IOHEXOL 300 MG/ML  SOLN
30.0000 mL | Freq: Once | INTRAMUSCULAR | Status: AC | PRN
  Administered 2018-07-13: 30 mL via ORAL

## 2018-07-13 MED ORDER — HEPARIN SOD (PORK) LOCK FLUSH 100 UNIT/ML IV SOLN
500.0000 [IU] | Freq: Once | INTRAVENOUS | Status: AC
  Administered 2018-07-13: 500 [IU] via INTRAVENOUS

## 2018-07-13 MED ORDER — HEPARIN SOD (PORK) LOCK FLUSH 100 UNIT/ML IV SOLN
INTRAVENOUS | Status: AC
  Administered 2018-07-13: 500 [IU] via INTRAVENOUS
  Filled 2018-07-13: qty 5

## 2018-07-13 MED ORDER — IOHEXOL 300 MG/ML  SOLN
100.0000 mL | Freq: Once | INTRAMUSCULAR | Status: AC | PRN
  Administered 2018-07-13: 100 mL via INTRAVENOUS

## 2018-07-13 MED ORDER — IOPAMIDOL (ISOVUE-300) INJECTION 61%
INTRAVENOUS | Status: AC
  Filled 2018-07-13: qty 30

## 2018-07-19 NOTE — Progress Notes (Signed)
HEMATOLOGY/ONCOLOGY CLINIC NOTE  Date of Service:  07/20/18     Patient Care Team: [name], MD as PCP - General (Internal Medicine) [name] (Surgery)    CHIEF COMPLAINTS/PURPOSE OF CONSULTATION:   F/u for continued management of gastric adenocarcinoma  HISTORY OF PRESENTING ILLNESS:   [name] is a wonderful 67 y.o. male is here for continued evaluation and management of recently diagnosed pT4a, pN3b, pM1 Gastric Adenocarcinoma.  Patient was previously seen in the hospital on 01/19/2017 with a diagnosis of gastric adenocarcinoma in the gastric pylorus causing significant intrinsic stenosis . Patient had presented to primary care physician with dyspeptic symptoms, partial gastric outlet obstruction and weight loss of 60 pounds. Patient has had a distal gastrectomy, cholecystectomy with jejunal feeding tube placement by Dr. Barry Dienes on 01/10/2017 for gastric obstruction secondary to adenocarcinoma of the gastric pylorus. He was noted to have invaded into the pancreas and a bit of the anterior pancreatic head was resected en bloc with the tumor.  Pathology showed  " INVASIVE MODERATELY TO POORLY DIFFERENTIATED ADENOCARCINOMA, SPANNING 4 CM IN GREATEST DIMENSION. TUMOR INVADES THROUGH STOMACH WALL TO INVOLVE SEROSAL SURFACE.  LYMPH/VASCULAR INVASION IS IDENTIFIED.  MARGINS ARE NEGATIVE.- FIFTEEN OF THIRTY NINE LYMPH NODES POSITIVE FOR METASTATIC ADENOCARCINOMA (15/39). EXTRACAPSULAR EXTENSION IS IDENTIFIED. 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  Patient had a prolonged hospitalization post surgery  due to significant NG tube output. Needed epidural and PCA for pain control. It took a while for him to be able to tolerate progression of 2 feeding. He had an abdominal drain due to a small collection near his pancreatic head. Was treated with Augmentin for this. Patient was in the  hospital from 01/10/2017 and was eventually discharged on 01/27/2017.  Patient postponed several attempts at posthospitalization follow-up. He has been recovering at home and has been optimizing his tube feeding. Patient was recently followed up by Dr. Barry Dienes and has had a port placement on 02/23/2017 and preparation for possible chemotherapy.  He notes that his tube feeding is up to 40 mL per hour for 12 hours and has targeted is 16 ml per hour. He was recently admitted to [facility] in [place] for a C. difficile colitis that caused a severe diarrhea. he is currently on oral vancomycin has completed about 5 days of treatment and is also on probiotics.  CURRENT THERAPY: Active surveillance  PREVIOUS THERAPY:  Concurrent Chemoradiation with Xeloda. He started 07/18/17 and completed 08/25/17  INTERVAL HISTORY   [name] is here for f/u for his gastric adenocarcinoma. The patient's last visit with [name] was on 04/19/18. The pt reports that he is doing well overall.   The pt reports that he has had no new concerns in the interim. He notes that he has stayed active. He notes marked improvement in his strength and function, noting similar improvements in his general quality of life. The pt notes that he has no abdominal pain and endorses normal bowel movements. The pt's neuropathy continues to resolve and is minimal at this point in time.   Of note since the patient's last visit, pt has had CT C/A/P completed on 07/13/18 with results revealing No evidence of metastatic disease in the chest, abdomen or pelvis. Previously described mildly enlarged high left para-aortic retroperitoneal node is decreased and now normal in size. Previously described peripancreatic fat stranding is decreased and probably treatment related.  Lab results (07/13/18) of CBC w/diff, CMP is  as follows: all values are WNL except for WBC at 3.2k, HGB at 12.0, HCT at 36.2, RDW at 14.9, PLT at 130k, Lymphs abs at 500, Glucose at 113. 07/13/18  CEA is WNL at 2.53  On review of systems, pt reports gaining weight, staying active, good energy levels, normal bowel movements, more strength, and denies blood in the stools, abdominal pains, leg swelling, problems with the port, and any other symptoms.   MEDICAL HISTORY:  Past Medical History:  Diagnosis Date  . Anemia   . Cancer [facility])    Gastric Adenocarcinoma  . Gastric outlet obstruction 01/2017  . GERD (gastroesophageal reflux disease)   . Pre-diabetes    denies  . SVT (supraventricular tachycardia) ([place])    during Admission and Surgery- 01/2017    SURGICAL HISTORY: Past Surgical History:  Procedure Laterality Date  . CHOLECYSTECTOMY N/A 01/10/2017   Procedure: CHOLECYSTECTOMY;  Surgeon: Stark Klein, MD;  Location: [place];  Service: General;  Laterality: N/A;  . COLONOSCOPY    . COLONOSCOPY  10/11/2011   Procedure: COLONOSCOPY;  Surgeon: Daneil Dolin, MD;  [facility];  Service: Endoscopy;  Laterality: N/A;  10:30 AM  . ESOPHAGOGASTRODUODENOSCOPY N/A 12/30/2016   Procedure: ESOPHAGOGASTRODUODENOSCOPY (EGD);  Surgeon: Carol Ada, MD;  Location: [facility];  Service: Endoscopy;  Laterality: N/A;  . GASTRECTOMY N/A 01/10/2017   Procedure: DISTAL GASTRECTOMY, JEJUNUM  FEEDING TUBE PLACEMENT;  Surgeon: Stark Klein, MD;  Location: [place];  Service: General;  Laterality: N/A;  . IR GENERIC HISTORICAL  01/24/2017   IR [place] DUODEN/JEJUNO TUBE PERCUT W/FLUORO 01/24/2017 Aletta Edouard, MD [facility]-INTERV RAD  . IR REPLC DUODEN/JEJUNO TUBE PERCUT W/FLUORO  03/26/2017  . PORTACATH PLACEMENT N/A 02/23/2017   Procedure: INSERTION PORT-A-CATH;  Surgeon: Stark Klein, MD;  Location: [place];  Service: General;  Laterality: N/A;  . testicle removed  1982    SOCIAL HISTORY: Social History   Socioeconomic History  . Marital status: Married    Spouse name: Not on file  . Number of children: Not on file  . Years of education: Not on file  . Highest education level: Not on file    Occupational History  . Not on file  Social Needs  . Financial resource strain: Not on file  . Food insecurity:    Worry: Not on file    Inability: Not on file  . Transportation needs:    Medical: Not on file    Non-medical: Not on file  Tobacco Use  . Smoking status: Never Smoker  . Smokeless tobacco: Never Used  Substance and Sexual Activity  . Alcohol use: No    Comment: occ beer or wine but none in last 6 months   . Drug use: No  . Sexual activity: Not on file  Lifestyle  . Physical activity:    Days per week: Not on file    Minutes per session: Not on file  . Stress: Not on file  Relationships  . Social connections:    Talks on phone: Not on file    Gets together: Not on file    Attends religious service: Not on file    Active member of club or organization: Not on file    Attends meetings of clubs or organizations: Not on file    Relationship status: Not on file  . Intimate partner violence:    Fear of current or ex partner: Not on file    Emotionally abused: Not on file    Physically  abused: Not on file    Forced sexual activity: Not on file  Other Topics Concern  . Not on file  Social History Narrative  . Not on file    FAMILY HISTORY: Family History  Problem Relation Age of Onset  . Prostate cancer Father   . Colon cancer Neg Hx     ALLERGIES:  is allergic to no known allergies.  MEDICATIONS:  Current Outpatient Medications  Medication Sig Dispense Refill  . ferrous sulfate 325 (65 FE) MG tablet Take 325 mg by mouth daily with breakfast.    . lidocaine-prilocaine (EMLA) cream Apply to affected area once 30 g 3  . Multiple Vitamin (MULTIVITAMIN) tablet Take 1 tablet by mouth daily.    [name] Kitchen omeprazole (PRILOSEC) 20 MG capsule Take 1 capsule (20 mg total) by mouth 2 (two) times daily before a meal. (Patient not taking: Reported on 04/19/2018) 60 capsule 2  . ondansetron (ZOFRAN) 8 MG tablet Take 1 tablet (8 mg total) by mouth every 8 (eight) hours as  needed for refractory nausea / vomiting. Start on day 3 after chemotherapy. (Patient not taking: Reported on 04/19/2018) 30 tablet 1  . prochlorperazine (COMPAZINE) 10 MG tablet Take 1 tablet (10 mg total) by mouth every 6 (six) hours as needed (Nausea or vomiting). (Patient not taking: Reported on 04/19/2018) 30 tablet 1  . sucralfate (CARAFATE) 1 GM/10ML suspension Take 10 mLs (1 g total) by mouth 4 (four) times daily -  with meals and at bedtime. (Patient not taking: Reported on 04/19/2018) 420 mL 1  . traZODone (DESYREL) 50 MG tablet TAKE 1 TABLET (50 MG TOTAL) BY MOUTH AT BEDTIME AS NEEDED FOR SLEEP. (Patient not taking: Reported on 04/19/2018) 30 tablet 0   No current facility-administered medications for this visit.     REVIEW OF SYSTEMS:    A 10+ POINT REVIEW OF SYSTEMS WAS OBTAINED including neurology, dermatology, psychiatry, cardiac, respiratory, lymph, extremities, GI, GU, Musculoskeletal, constitutional, breasts, reproductive, HEENT.  All pertinent positives are noted in the HPI.  All others are negative.   PHYSICAL EXAMINATION:  ECOG PERFORMANCE STATUS: 2 - Symptomatic, <50% confined to bed  Vitals:   07/20/18 1146  BP: (!) 142/53  Pulse: 68  Resp: 18  Temp: 97.8 F (36.6 C)  SpO2: 100%   Filed Weights   07/20/18 1146  Weight: 279 lb 11.2 oz (126.9 kg)   Body mass index is 39.01 kg/m.   GENERAL:alert, in no acute distress and comfortable SKIN: no acute rashes, no significant lesions EYES: conjunctiva are pink and non-injected, sclera anicteric OROPHARYNX: MMM, no exudates, no oropharyngeal erythema or ulceration NECK: supple, no JVD LYMPH:  no palpable lymphadenopathy in the cervical, axillary or inguinal regions LUNGS: clear to auscultation b/l with normal respiratory effort HEART: regular rate & rhythm ABDOMEN:  normoactive bowel sounds , non tender, not distended. No palpable hepatosplenomegaly.  Extremity: no pedal edema PSYCH: alert & oriented x 3 with fluent  speech NEURO: no focal motor/sensory deficits   LABORATORY DATA:  I have reviewed the data as listed  . CBC Latest Ref Rng & Units 07/13/2018 04/19/2018 01/16/2018  WBC 4.0 - 10.3 K/uL 3.2(L) 4.6 3.7(L)  Hemoglobin 13.0 - 17.1 g/dL 12.0(L) 11.7(L) 11.4(L)  Hematocrit 38.4 - 49.9 % 36.2(L) 36.2(L) 35.5(L)  Platelets 140 - 400 K/uL 130(L) 145 153    . CMP Latest Ref Rng & Units 07/13/2018 04/19/2018 01/16/2018  Glucose 70 - 99 mg/dL 113(H) 112 110  BUN 8 - 23 mg/dL 16  15 15  Creatinine 0.61 - 1.24 mg/dL 1.01 1.09 0.97  Sodium 135 - 145 mmol/L 142 143 140  Potassium 3.5 - 5.1 mmol/L 4.2 4.2 4.6  Chloride 98 - 111 mmol/L 106 108 107  CO2 22 - 32 mmol/L 27 28 26   Calcium 8.9 - 10.3 mg/dL 9.3 9.5 9.8  Total Protein 6.5 - 8.1 g/dL 7.2 7.3 7.3  Total Bilirubin 0.3 - 1.2 mg/dL 0.6 0.6 0.5  Alkaline Phos 38 - 126 U/L 89 98 101  AST 15 - 41 U/L 22 18 22   ALT 0 - 44 U/L 16 12 18       RADIOGRAPHIC STUDIES: I have personally reviewed the radiological images as listed and agreed with the findings in the report. Ct Chest W Contrast  Result Date: 07/15/2018 CLINICAL DATA:  Locally advanced gastric adenocarcinoma status post surgery and adjuvant therapy. Restaging. EXAM: CT CHEST, ABDOMEN, AND PELVIS WITH CONTRAST TECHNIQUE: Multidetector CT imaging of the chest, abdomen and pelvis was performed following the standard protocol during bolus administration of intravenous contrast. CONTRAST:  163mL OMNIPAQUE IOHEXOL 300 MG/ML  SOLN COMPARISON:  11/16/2017 CT chest, abdomen and pelvis. FINDINGS: CT CHEST FINDINGS Cardiovascular: Normal heart size. No significant pericardial effusion/thickening. Left anterior descending and right coronary atherosclerosis. Left subclavian MediPort terminates in the middle third of the SVC. Normal course and caliber of thoracic aorta. Top-normal caliber main pulmonary artery (3.2 cm diameter). No central pulmonary emboli. Mediastinum/Nodes: No discrete thyroid nodules. Unremarkable  esophagus. No pathologically enlarged axillary, mediastinal or hilar lymph nodes. Lungs/Pleura: No pneumothorax. No pleural effusion. No acute consolidative airspace disease, lung masses or significant pulmonary nodules. Musculoskeletal: No aggressive appearing focal osseous lesions. Moderate thoracic spondylosis. CT ABDOMEN PELVIS FINDINGS Hepatobiliary: Normal liver with no liver mass. Cholecystectomy. No biliary ductal dilatation. Pancreas: No pancreatic mass, thickening or duct dilation. Mild peripancreatic fat stranding is mildly decreased and probably treatment related. Spleen: Normal size spleen. No splenic mass. Stable granulomatous splenic calcification. Adrenals/Urinary Tract: Normal adrenals. Normal kidneys with no hydronephrosis and no renal mass. Normal bladder. Stomach/Bowel: Small hiatal hernia. Status post distal gastrectomy with gastrojejunostomy. No discrete gastric mass or significant gastric wall thickening. Normal caliber small bowel with no small bowel wall thickening. Normal appendix. Mild left colonic diverticulosis, with no large bowel wall thickening or significant acute pericolonic fat stranding. Moderate stool in rectum and distal colon. Vascular/Lymphatic: Normal caliber abdominal aorta. Patent portal, splenic, hepatic and renal veins. Previously described mildly enlarged high left para aortic 1.0 cm node is decreased to 0.8 cm (series 2/image 62). No pathologically enlarged lymph nodes in the abdomen or pelvis. Reproductive: Mildly enlarged prostate. Other: No pneumoperitoneum, ascites or focal fluid collection. Stable postsurgical changes from right orchiectomy. Musculoskeletal: No aggressive appearing focal osseous lesions. Moderate lumbar spondylosis. IMPRESSION: No evidence of metastatic disease in the chest, abdomen or pelvis. Previously described mildly enlarged high left para-aortic retroperitoneal node is decreased and now normal in size. Previously described peripancreatic fat  stranding is decreased and probably treatment related. Electronically Signed   By: Ilona Sorrel M.D.   On: 07/15/2018 12:35   Ct Abdomen Pelvis W Contrast  Result Date: 07/15/2018 CLINICAL DATA:  Locally advanced gastric adenocarcinoma status post surgery and adjuvant therapy. Restaging. EXAM: CT CHEST, ABDOMEN, AND PELVIS WITH CONTRAST TECHNIQUE: Multidetector CT imaging of the chest, abdomen and pelvis was performed following the standard protocol during bolus administration of intravenous contrast. CONTRAST:  14mL OMNIPAQUE IOHEXOL 300 MG/ML  SOLN COMPARISON:  11/16/2017 CT chest, abdomen and pelvis.  FINDINGS: CT CHEST FINDINGS Cardiovascular: Normal heart size. No significant pericardial effusion/thickening. Left anterior descending and right coronary atherosclerosis. Left subclavian MediPort terminates in the middle third of the SVC. Normal course and caliber of thoracic aorta. Top-normal caliber main pulmonary artery (3.2 cm diameter). No central pulmonary emboli. Mediastinum/Nodes: No discrete thyroid nodules. Unremarkable esophagus. No pathologically enlarged axillary, mediastinal or hilar lymph nodes. Lungs/Pleura: No pneumothorax. No pleural effusion. No acute consolidative airspace disease, lung masses or significant pulmonary nodules. Musculoskeletal: No aggressive appearing focal osseous lesions. Moderate thoracic spondylosis. CT ABDOMEN PELVIS FINDINGS Hepatobiliary: Normal liver with no liver mass. Cholecystectomy. No biliary ductal dilatation. Pancreas: No pancreatic mass, thickening or duct dilation. Mild peripancreatic fat stranding is mildly decreased and probably treatment related. Spleen: Normal size spleen. No splenic mass. Stable granulomatous splenic calcification. Adrenals/Urinary Tract: Normal adrenals. Normal kidneys with no hydronephrosis and no renal mass. Normal bladder. Stomach/Bowel: Small hiatal hernia. Status post distal gastrectomy with gastrojejunostomy. No discrete gastric  mass or significant gastric wall thickening. Normal caliber small bowel with no small bowel wall thickening. Normal appendix. Mild left colonic diverticulosis, with no large bowel wall thickening or significant acute pericolonic fat stranding. Moderate stool in rectum and distal colon. Vascular/Lymphatic: Normal caliber abdominal aorta. Patent portal, splenic, hepatic and renal veins. Previously described mildly enlarged high left para aortic 1.0 cm node is decreased to 0.8 cm (series 2/image 62). No pathologically enlarged lymph nodes in the abdomen or pelvis. Reproductive: Mildly enlarged prostate. Other: No pneumoperitoneum, ascites or focal fluid collection. Stable postsurgical changes from right orchiectomy. Musculoskeletal: No aggressive appearing focal osseous lesions. Moderate lumbar spondylosis. IMPRESSION: No evidence of metastatic disease in the chest, abdomen or pelvis. Previously described mildly enlarged high left para-aortic retroperitoneal node is decreased and now normal in size. Previously described peripancreatic fat stranding is decreased and probably treatment related. Electronically Signed   By: Ilona Sorrel M.D.   On: 07/15/2018 12:35    ASSESSMENT & PLAN:   67 y.o.  African-American male with  1) Resected Stage IV pT4a, pN3b, pM1 invasive moderately-poorly Gastric Adenocarcinoma . Her 2 neg by FISH LVI + margins neg 15/39 LN +ve, Extracapsular invasion +, some involvement of pancreatic capsule there pM1 Presented with severe pyloric stenosis with ulceration. S/p 60 pound weight loss- now resolved. S/p distal gastrectomy, cholecystectomy and en bloc dissection off the small part of the pancreatic head  with jejunal feeding tube placement. Initial CT abdomen and pelvis did not show any local regional lymphadenopathy. Rpt CT chest/abd/pelvis from 03/22/2017 - no overt evidence of residual disease or metastatic disease. Post-surgical peripancreatic fluid collection resolving. CT  chest/abd/pelvis 06/21/2017 after 6 cycles of FOLFOX -  No evidence of residual or metastatic carcinoma within the chest, abdomen, or pelvis -Started Chemoradiation on 07/18/17 with Xeloda (825mg /m2 twice daily Monday through Friday while on radiation) and completed treatment on 08/24/2017. CT chest/abd/pelvis (11/16/2017) - show radiation related changes but no evidence of tumor progression at this time.   #2 s/p moderate protein calorie malnutrition patient had lost about 60 pounds in the last 2-3 months but has been gaining back his lost weight well and is back to the weigh that he would like to be at. . Wt Readings from Last 3 Encounters:  07/20/18 279 lb 11.2 oz (126.9 kg)  04/19/18 268 lb 6.4 oz (121.7 kg)  01/16/18 263 lb 1.6 oz (119.3 kg)   #3 status post port placement on 02/23/2017 by dr. Barry Dienes   #4 h/o Clostridium difficile colitis -.  No overt issues with diarrhea currently.   #5 suspected sleep apnea.Has not had a sleep study.Wife notes significant snoring which is important with weight loss. Will have to be careful with excessive sedation. -continue prn low dose trazodone for insomnia -recommended he f/u with his PCP for consideration of sleep study-pending   PLAN:  -Discussed pt labwork from 07/13/18; borderline low WBC and PLT, normal CEA and chemistries  -Discussed the 07/13/18 CT C/A/P which revealed No evidence of metastatic disease in the chest, abdomen or pelvis. Previously described mildly enlarged high left para-aortic retroperitoneal node is decreased and now normal in size. Previously described peripancreatic fat stranding is decreased and probably treatment related. -patient will discuss port removal recommendations with surgeon Dr. Barry Dienes -Will see the pt back in 4 months, sooner if any new concerns  -No indication for further treatment at this time.  -Will continue surveillance with CT Scan every other visit and f/u every 3-4 months for the first 3 years.   Port  flush in 2 months RTC with Dr Irene Limbo with labs and port flush appointment in 4 months   All of the patients questions were answered with apparent satisfaction. The patient knows to call the clinic with any problems, questions or concerns.   The total time spent in the appt was 20 minutes and more than 50% was on counseling and direct patient cares.     Sullivan Lone MD MS AAHIVMS [facility] Hematology/Oncology Physician [facility]  (Office):       [phone] (Work cell):  [phone] (Fax):           [phone]  I, Baldwin [place], am acting as a scribe for Dr. Irene Limbo  .I have reviewed the above documentation for accuracy and completeness, and I agree with the above. Brunetta Genera MD

## 2018-07-20 ENCOUNTER — Inpatient Hospital Stay: Payer: Medicare Other

## 2018-07-20 ENCOUNTER — Inpatient Hospital Stay: Payer: Medicare Other | Attending: Hematology | Admitting: Hematology

## 2018-07-20 VITALS — BP 142/53 | HR 68 | Temp 97.8°F | Resp 18 | Ht 71.0 in | Wt 279.7 lb

## 2018-07-20 DIAGNOSIS — E440 Moderate protein-calorie malnutrition: Secondary | ICD-10-CM | POA: Diagnosis not present

## 2018-07-20 DIAGNOSIS — E43 Unspecified severe protein-calorie malnutrition: Secondary | ICD-10-CM

## 2018-07-20 DIAGNOSIS — D709 Neutropenia, unspecified: Secondary | ICD-10-CM

## 2018-07-20 DIAGNOSIS — Z85028 Personal history of other malignant neoplasm of stomach: Secondary | ICD-10-CM | POA: Diagnosis not present

## 2018-07-20 DIAGNOSIS — K2960 Other gastritis without bleeding: Secondary | ICD-10-CM

## 2018-07-20 DIAGNOSIS — C160 Malignant neoplasm of cardia: Secondary | ICD-10-CM

## 2018-08-13 ENCOUNTER — Encounter: Payer: Self-pay | Admitting: Hematology

## 2018-08-13 DIAGNOSIS — Z7189 Other specified counseling: Secondary | ICD-10-CM | POA: Insufficient documentation

## 2018-08-27 ENCOUNTER — Encounter: Payer: Self-pay | Admitting: Internal Medicine

## 2018-09-05 DIAGNOSIS — Z23 Encounter for immunization: Secondary | ICD-10-CM | POA: Diagnosis not present

## 2018-09-17 ENCOUNTER — Inpatient Hospital Stay: Payer: Medicare Other | Attending: Hematology

## 2018-11-16 NOTE — Progress Notes (Signed)
HEMATOLOGY/ONCOLOGY CLINIC NOTE  Date of Service:  11/19/18     Patient Care Team: [name], MD as PCP - General (Internal Medicine) [name] (Surgery)    CHIEF COMPLAINTS/PURPOSE OF CONSULTATION:   F/u for continued management of gastric adenocarcinoma  HISTORY OF PRESENTING ILLNESS:   [name] is a wonderful 68 y.o. male is here for continued evaluation and management of recently diagnosed pT4a, pN3b, pM1 Gastric Adenocarcinoma.  Patient was previously seen in the hospital on 01/19/2017 with a diagnosis of gastric adenocarcinoma in the gastric pylorus causing significant intrinsic stenosis . Patient had presented to primary care physician with dyspeptic symptoms, partial gastric outlet obstruction and weight loss of 60 pounds. Patient has had a distal gastrectomy, cholecystectomy with jejunal feeding tube placement by Dr. Barry Dienes on 01/10/2017 for gastric obstruction secondary to adenocarcinoma of the gastric pylorus. He was noted to have invaded into the pancreas and a bit of the anterior pancreatic head was resected en bloc with the tumor.  Pathology showed  " INVASIVE MODERATELY TO POORLY DIFFERENTIATED ADENOCARCINOMA, SPANNING 4 CM IN GREATEST DIMENSION. TUMOR INVADES THROUGH STOMACH WALL TO INVOLVE SEROSAL SURFACE.  LYMPH/VASCULAR INVASION IS IDENTIFIED.  MARGINS ARE NEGATIVE.- FIFTEEN OF THIRTY NINE LYMPH NODES POSITIVE FOR METASTATIC ADENOCARCINOMA (15/39). EXTRACAPSULAR EXTENSION IS IDENTIFIED. 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  Patient had a prolonged hospitalization post surgery  due to significant NG tube output. Needed epidural and PCA for pain control. It took a while for him to be able to tolerate progression of 2 feeding. He had an abdominal drain due to a small collection near his pancreatic head. Was treated with Augmentin for this. Patient was in the  hospital from 01/10/2017 and was eventually discharged on 01/27/2017.  Patient postponed several attempts at posthospitalization follow-up. He has been recovering at home and has been optimizing his tube feeding. Patient was recently followed up by Dr. Barry Dienes and has had a port placement on 02/23/2017 and preparation for possible chemotherapy.  He notes that his tube feeding is up to 40 mL per hour for 12 hours and has targeted is 16 ml per hour. He was recently admitted to [facility] in [place] for a C. difficile colitis that caused a severe diarrhea. he is currently on oral vancomycin has completed about 5 days of treatment and is also on probiotics.  CURRENT THERAPY: Active surveillance  PREVIOUS THERAPY:  Concurrent Chemoradiation with Xeloda. He started 07/18/17 and completed 08/25/17  INTERVAL HISTORY   [name] is here for f/u for his gastric adenocarcinoma. The patient's last visit with [name] was on 07/20/18. The pt reports that he is doing well overall.   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 He notes however that he has been gaining weight, which is his only concern at this time. He notes that he intends to lose weight. The pt notes that he has been eating well. He denies any leg swelling, and endorses sleeping well. He eats 3 meals a day, eats dinner at 7pm, limits his soda intake, stays hydrated, and goes to sleep at midnight.   The pt denies back pains and abdominal pains.   Lab results today (11/19/18) of CBC w/diff and CMP is as follows: all values are WNL except for RBC at 4.21, HGB at 11.9, HCT at 36.2, PLT at  145k, Glucose at 124. 11/19/18 CEA is 2.71  On review of systems, pt reports staying active, weight gain, eating well, sleeping well, moving his bowels well, and denies leg swelling, back pains, abdominal pains, and any other symptoms.    MEDICAL HISTORY:    Past Medical History:  Diagnosis Date  . Anemia   . Cancer [facility])    Gastric Adenocarcinoma  . Gastric outlet obstruction 01/2017  . GERD (gastroesophageal reflux disease)   . Pre-diabetes    denies  . SVT (supraventricular tachycardia) ([place])    during Admission and Surgery- 01/2017    SURGICAL HISTORY: Past Surgical History:  Procedure Laterality Date  . CHOLECYSTECTOMY N/A 01/10/2017   Procedure: CHOLECYSTECTOMY;  Surgeon: Stark Klein, MD;  Location: [place];  Service: General;  Laterality: N/A;  . COLONOSCOPY    . COLONOSCOPY  10/11/2011   Procedure: COLONOSCOPY;  Surgeon: Daneil Dolin, MD;  [facility];  Service: Endoscopy;  Laterality: N/A;  10:30 AM  . ESOPHAGOGASTRODUODENOSCOPY N/A 12/30/2016   Procedure: ESOPHAGOGASTRODUODENOSCOPY (EGD);  Surgeon: Carol Ada, MD;  Location: [facility];  Service: Endoscopy;  Laterality: N/A;  . GASTRECTOMY N/A 01/10/2017   Procedure: DISTAL GASTRECTOMY, JEJUNUM  FEEDING TUBE PLACEMENT;  Surgeon: Stark Klein, MD;  Location: [place];  Service: General;  Laterality: N/A;  . IR GENERIC HISTORICAL  01/24/2017   IR [place] DUODEN/JEJUNO TUBE PERCUT W/FLUORO 01/24/2017 Aletta Edouard, MD [facility]-INTERV RAD  . IR REPLC DUODEN/JEJUNO TUBE PERCUT W/FLUORO  03/26/2017  . PORTACATH PLACEMENT N/A 02/23/2017   Procedure: INSERTION PORT-A-CATH;  Surgeon: Stark Klein, MD;  Location: [place];  Service: General;  Laterality: N/A;  . testicle removed  1982    SOCIAL HISTORY: Social History   Socioeconomic History  . Marital status: Married    Spouse name: Not on file  . Number of children: Not on file  . Years of education: Not on file  . Highest education level: Not on file  Occupational History  . Not on file  Social Needs  . Financial resource strain: Not on file  . Food insecurity:    Worry: Not on file    Inability: Not on file  . Transportation needs:    Medical: Not on file    Non-medical: Not on file  Tobacco Use  . Smoking status:  Never Smoker  . Smokeless tobacco: Never Used  Substance and Sexual Activity  . Alcohol use: No    Comment: occ beer or wine but none in last 6 months   . Drug use: No  . Sexual activity: Not on file  Lifestyle  . Physical activity:    Days per week: Not on file    Minutes per session: Not on file  . Stress: Not on file  Relationships  . Social connections:    Talks on phone: Not on file    Gets together: Not on file    Attends religious service: Not on file    Active member of club or organization: Not on file    Attends meetings of clubs or organizations: Not on file    Relationship status: Not on file  . Intimate partner violence:    Fear of current or ex partner: Not on file    Emotionally abused: Not on file    Physically abused: Not on file    Forced sexual activity: Not on file  Other Topics Concern  . Not on file  Social History Narrative  . Not on file  FAMILY HISTORY: Family History  Problem Relation Age of Onset  . Prostate cancer Father   . Colon cancer Neg Hx     ALLERGIES:  is allergic to no known allergies.  MEDICATIONS:  Current Outpatient Medications  Medication Sig Dispense Refill  . ferrous sulfate 325 (65 FE) MG tablet Take 325 mg by mouth daily with breakfast.    . lidocaine-prilocaine (EMLA) cream Apply to affected area once 30 g 3  . Multiple Vitamin (MULTIVITAMIN) tablet Take 1 tablet by mouth daily.    [name] Kitchen omeprazole (PRILOSEC) 20 MG capsule Take 1 capsule (20 mg total) by mouth 2 (two) times daily before a meal. (Patient not taking: Reported on 04/19/2018) 60 capsule 2  . ondansetron (ZOFRAN) 8 MG tablet Take 1 tablet (8 mg total) by mouth every 8 (eight) hours as needed for refractory nausea / vomiting. Start on day 3 after chemotherapy. (Patient not taking: Reported on 04/19/2018) 30 tablet 1  . prochlorperazine (COMPAZINE) 10 MG tablet Take 1 tablet (10 mg total) by mouth every 6 (six) hours as needed (Nausea or vomiting). (Patient not taking:  Reported on 04/19/2018) 30 tablet 1  . sucralfate (CARAFATE) 1 GM/10ML suspension Take 10 mLs (1 g total) by mouth 4 (four) times daily -  with meals and at bedtime. (Patient not taking: Reported on 04/19/2018) 420 mL 1  . traZODone (DESYREL) 50 MG tablet TAKE 1 TABLET (50 MG TOTAL) BY MOUTH AT BEDTIME AS NEEDED FOR SLEEP. (Patient not taking: Reported on 04/19/2018) 30 tablet 0   No current facility-administered medications for this visit.     REVIEW OF SYSTEMS:    A 10+ POINT REVIEW OF SYSTEMS WAS OBTAINED including neurology, dermatology, psychiatry, cardiac, respiratory, lymph, extremities, GI, GU, Musculoskeletal, constitutional, breasts, reproductive, HEENT.  All pertinent positives are noted in the HPI.  All others are negative.   PHYSICAL EXAMINATION:  ECOG PERFORMANCE STATUS: 2 - Symptomatic, <50% confined to bed  Vitals:   11/19/18 1338  BP: (!) 146/71  Pulse: 62  Resp: 20  Temp: 97.7 F (36.5 C)  SpO2: 100%   Filed Weights   11/19/18 1338  Weight: 291 lb 3.2 oz (132.1 kg)   Body mass index is 40.61 kg/m.   GENERAL:alert, in no acute distress and comfortable SKIN: no acute rashes, no significant lesions EYES: conjunctiva are pink and non-injected, sclera anicteric OROPHARYNX: MMM, no exudates, no oropharyngeal erythema or ulceration NECK: supple, no JVD LYMPH:  no palpable lymphadenopathy in the cervical, axillary or inguinal regions LUNGS: clear to auscultation b/l with normal respiratory effort HEART: regular rate & rhythm ABDOMEN:  normoactive bowel sounds , non tender, not distended. No palpable hepatosplenomegaly.  Extremity: no pedal edema PSYCH: alert & oriented x 3 with fluent speech NEURO: no focal motor/sensory deficits   LABORATORY DATA:  I have reviewed the data as listed  . CBC Latest Ref Rng & Units 11/19/2018 07/13/2018 04/19/2018  WBC 4.0 - 10.5 K/uL 4.6 3.2(L) 4.6  Hemoglobin 13.0 - 17.0 g/dL 11.9(L) 12.0(L) 11.7(L)  Hematocrit 39.0 - 52.0 % 36.2(L)  36.2(L) 36.2(L)  Platelets 150 - 400 K/uL 145(L) 130(L) 145    . CMP Latest Ref Rng & Units 11/19/2018 07/13/2018 04/19/2018  Glucose 70 - 99 mg/dL 124(H) 113(H) 112  BUN 8 - 23 mg/dL 16 16 15   Creatinine 0.61 - 1.24 mg/dL 0.96 1.01 1.09  Sodium 135 - 145 mmol/L 140 142 143  Potassium 3.5 - 5.1 mmol/L 4.3 4.2 4.2  Chloride 98 - 111  mmol/L 107 106 108  CO2 22 - 32 mmol/L 26 27 28   Calcium 8.9 - 10.3 mg/dL 9.1 9.3 9.5  Total Protein 6.5 - 8.1 g/dL 6.9 7.2 7.3  Total Bilirubin 0.3 - 1.2 mg/dL 0.5 0.6 0.6  Alkaline Phos 38 - 126 U/L 85 89 98  AST 15 - 41 U/L 19 22 18   ALT 0 - 44 U/L 19 16 12       RADIOGRAPHIC STUDIES: I have personally reviewed the radiological images as listed and agreed with the findings in the report. No results found.  ASSESSMENT & PLAN:   68 y.o.  African-American male with  1) Resected Stage IV pT4a, pN3b, pM1 invasive moderately-poorly Gastric Adenocarcinoma . Her 2 neg by FISH LVI + margins neg 15/39 LN +ve, Extracapsular invasion +, some involvement of pancreatic capsule there pM1 Presented with severe pyloric stenosis with ulceration. S/p 60 pound weight loss- now resolved. S/p distal gastrectomy, cholecystectomy and en bloc dissection off the small part of the pancreatic head  with jejunal feeding tube placement. Initial CT abdomen and pelvis did not show any local regional lymphadenopathy. Rpt CT chest/abd/pelvis from 03/22/2017 - no overt evidence of residual disease or metastatic disease. Post-surgical peripancreatic fluid collection resolving. CT chest/abd/pelvis 06/21/2017 after 6 cycles of FOLFOX -  No evidence of residual or metastatic carcinoma within the chest, abdomen, or pelvis -Started Chemoradiation on 07/18/17 with Xeloda (825mg /m2 twice daily Monday through Friday while on radiation) and completed treatment on 08/24/2017. CT chest/abd/pelvis (11/16/2017) - show radiation related changes but no evidence of tumor progression at this time. 07/13/18 CT  C/A/P revealed No evidence of metastatic disease in the chest, abdomen or pelvis. Previously described mildly enlarged high left para-aortic retroperitoneal node is decreased and now normal in size. Previously described peripancreatic fat stranding is decreased and probably treatment related.   #2 s/p moderate protein calorie malnutrition patient had lost about 60 pounds in the last 2-3 months but has been gaining back his lost weight well. . Wt Readings from Last 3 Encounters:  11/19/18 291 lb 3.2 oz (132.1 kg)  07/20/18 279 lb 11.2 oz (126.9 kg)  04/19/18 268 lb 6.4 oz (121.7 kg)   #3 status post port placement on 02/23/2017 by dr. Barry Dienes   #4 h/o Clostridium difficile colitis -. No overt issues with diarrhea currently.   #5 suspected sleep apnea.Has not had a sleep study.Wife notes significant snoring which is important with weight loss. Will have to be careful with excessive sedation. -continue prn low dose trazodone for insomnia -recommended he f/u with his PCP for consideration of sleep study-pending   PLAN: -Discussed pt labwork today, 11/19/18; blood counts and chemistries are stable  -11/19/18 CEA is WNL -Counseled the pt towards dietary improvement for his desired weight loss -Prevnar in March 2019, Pneumovax in March 2018, obtained annual flu vaccine as well. -Defer question of 81mg  aspirin daily to PCP for primary prevention strategy -The pt shows no clinical or lab progression/return of his gastric adenocarcinoma at this time.  -No indication for additional treatment at this time. -Will repeat CT imaging prior to next visit in 4 months  -patient will discuss port removal recommendations with surgeon Dr. Barry Dienes  -Will continue surveillance with CT Scan every other visit and f/u every 3-4 months for the first 3 years.  -Will see the pt back in 4 months   CT abd/pelvis in 16 weeks Port flush in 2 months and in 4 months RTC with Dr Irene Limbo with labs in  4 months(with port  flush)   All of the patients questions were answered with apparent satisfaction. The patient knows to call the clinic with any problems, questions or concerns.   The total time spent in the appt was 25 minutes and more than 50% was on counseling and direct patient cares.     Sullivan Lone MD [place] AAHIVMS [facility] Hematology/Oncology Physician [facility]  (Office):       [phone] (Work cell):  [phone] (Fax):           [phone]  I, Baldwin [place], am acting as a scribe for Dr. Sullivan Lone.   .I have reviewed the above documentation for accuracy and completeness, and I agree with the above. Brunetta Genera MD

## 2018-11-19 ENCOUNTER — Inpatient Hospital Stay: Payer: Medicare Other

## 2018-11-19 ENCOUNTER — Inpatient Hospital Stay: Payer: Medicare Other | Attending: Hematology | Admitting: Hematology

## 2018-11-19 VITALS — BP 146/71 | HR 62 | Temp 97.7°F | Resp 20 | Ht 71.0 in | Wt 291.2 lb

## 2018-11-19 DIAGNOSIS — Z95828 Presence of other vascular implants and grafts: Secondary | ICD-10-CM

## 2018-11-19 DIAGNOSIS — C160 Malignant neoplasm of cardia: Secondary | ICD-10-CM

## 2018-11-19 DIAGNOSIS — K219 Gastro-esophageal reflux disease without esophagitis: Secondary | ICD-10-CM | POA: Insufficient documentation

## 2018-11-19 DIAGNOSIS — Z85028 Personal history of other malignant neoplasm of stomach: Secondary | ICD-10-CM

## 2018-11-19 DIAGNOSIS — Z452 Encounter for adjustment and management of vascular access device: Secondary | ICD-10-CM | POA: Insufficient documentation

## 2018-11-19 DIAGNOSIS — Z7189 Other specified counseling: Secondary | ICD-10-CM

## 2018-11-19 LAB — CBC WITH DIFFERENTIAL/PLATELET
Abs Immature Granulocytes: 0.01 10*3/uL (ref 0.00–0.07)
Basophils Absolute: 0 10*3/uL (ref 0.0–0.1)
Basophils Relative: 1 %
Eosinophils Absolute: 0.1 10*3/uL (ref 0.0–0.5)
Eosinophils Relative: 1 %
HCT: 36.2 % — ABNORMAL LOW (ref 39.0–52.0)
Hemoglobin: 11.9 g/dL — ABNORMAL LOW (ref 13.0–17.0)
Immature Granulocytes: 0 %
Lymphocytes Relative: 15 %
Lymphs Abs: 0.7 10*3/uL (ref 0.7–4.0)
MCH: 28.3 pg (ref 26.0–34.0)
MCHC: 32.9 g/dL (ref 30.0–36.0)
MCV: 86 fL (ref 80.0–100.0)
Monocytes Absolute: 0.5 10*3/uL (ref 0.1–1.0)
Monocytes Relative: 10 %
Neutro Abs: 3.4 10*3/uL (ref 1.7–7.7)
Neutrophils Relative %: 73 %
Platelets: 145 10*3/uL — ABNORMAL LOW (ref 150–400)
RBC: 4.21 MIL/uL — ABNORMAL LOW (ref 4.22–5.81)
RDW: 13.9 % (ref 11.5–15.5)
WBC: 4.6 10*3/uL (ref 4.0–10.5)
nRBC: 0 % (ref 0.0–0.2)

## 2018-11-19 LAB — CMP (CANCER CENTER ONLY)
ALT: 19 U/L (ref 0–44)
AST: 19 U/L (ref 15–41)
Albumin: 3.6 g/dL (ref 3.5–5.0)
Alkaline Phosphatase: 85 U/L (ref 38–126)
Anion gap: 7 (ref 5–15)
BUN: 16 mg/dL (ref 8–23)
CO2: 26 mmol/L (ref 22–32)
Calcium: 9.1 mg/dL (ref 8.9–10.3)
Chloride: 107 mmol/L (ref 98–111)
Creatinine: 0.96 mg/dL (ref 0.61–1.24)
GFR, Est AFR Am: >60 mL/min (ref >60)
GFR, Estimated: >60 mL/min (ref >60)
Glucose, Bld: 124 mg/dL — ABNORMAL HIGH (ref 70–99)
Potassium: 4.3 mmol/L (ref 3.5–5.1)
Sodium: 140 mmol/L (ref 135–145)
Total Bilirubin: 0.5 mg/dL (ref 0.3–1.2)
Total Protein: 6.9 g/dL (ref 6.5–8.1)

## 2018-11-19 LAB — CEA (IN HOUSE-CHCC): CEA (CHCC-In House): 2.71 ng/mL (ref 0.00–5.00)

## 2018-11-19 IMAGING — CT CT ABD-PELV W/O CM
2 of 4 series · 16 of 46 positions shown, 18 images · non-contrast
Comparison: 12/26/2016 CT

CLINICAL DATA: 66-year-old male postop day 16 status post distal
gastrectomy for carcinoma. Cholecystectomy and jejunostomy tube.
Evaluate for residual fluid collections.

EXAM:
CT ABDOMEN AND PELVIS WITHOUT CONTRAST
TECHNIQUE: Multidetector CT imaging of the abdomen and pelvis was performed
following the standard protocol without IV contrast.

[Series 3: abd/ pelvis 5.0 i30f 2 · axial · 0.98mm/px · z∈[-892,-352]mm · 13 of 118 slices shown, 15 images]
[im 5/118  soft-tissue]
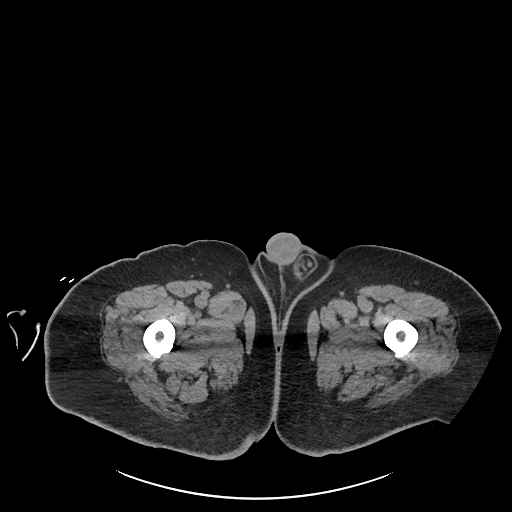
[im 5/118  bone]
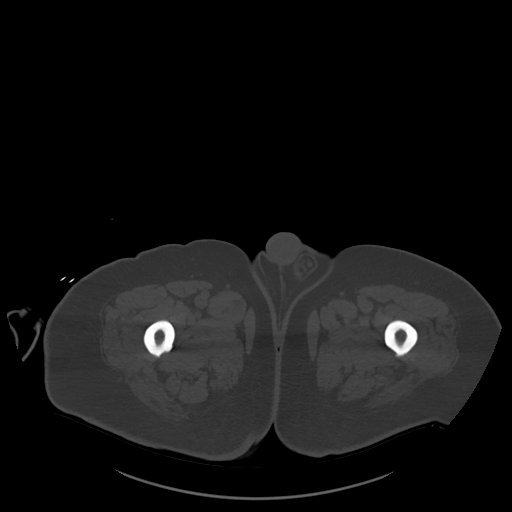
[im 14/118  soft-tissue]
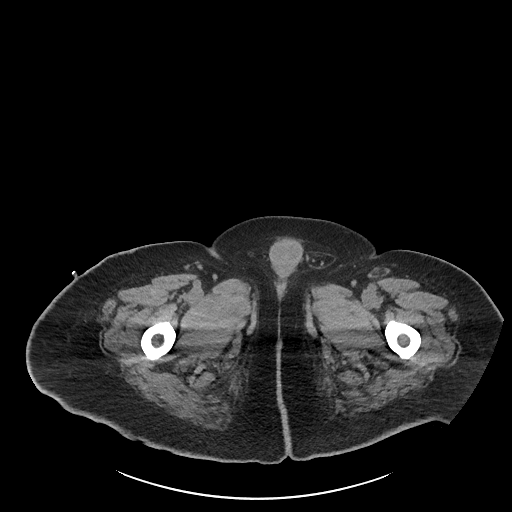
[im 23/118  soft-tissue]
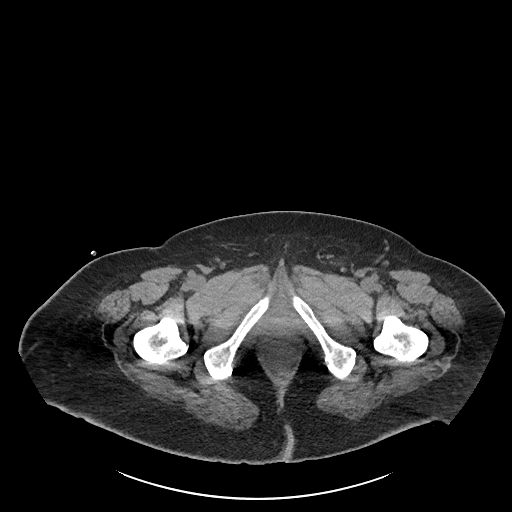
[im 32/118  soft-tissue]
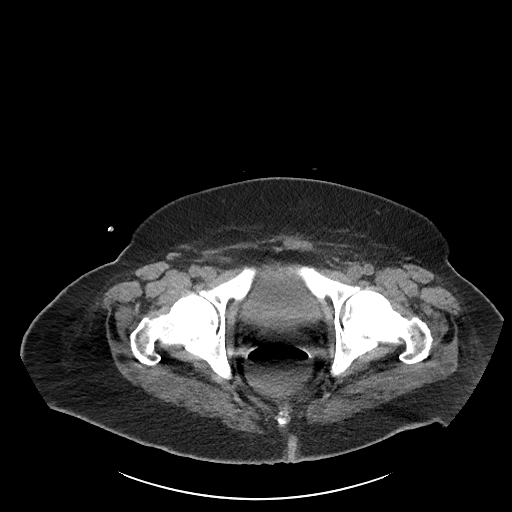
[im 41/118  soft-tissue]
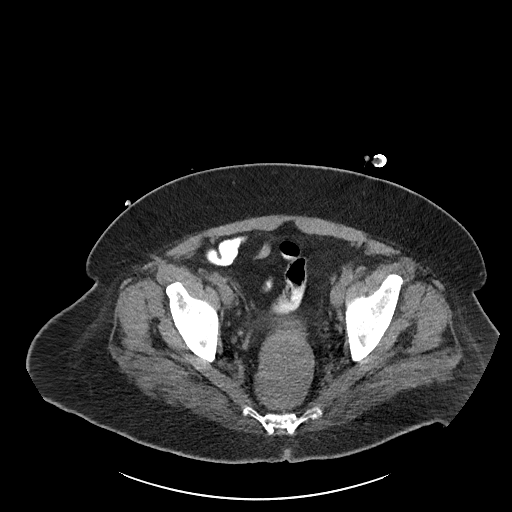
[im 50/118  soft-tissue]
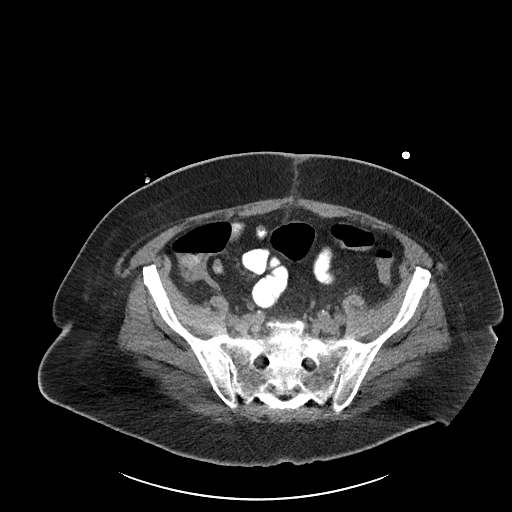
[im 59/118  soft-tissue]
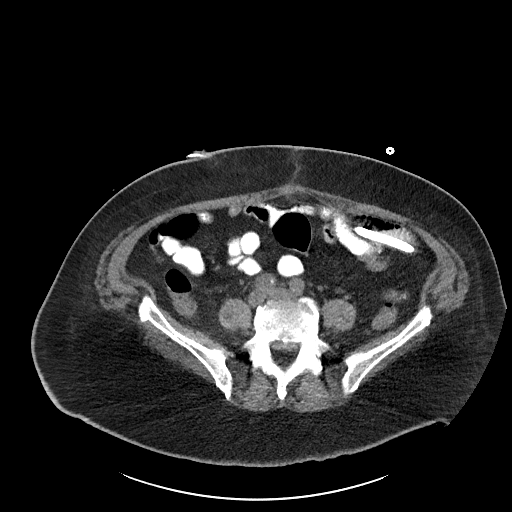
[im 68/118  soft-tissue]
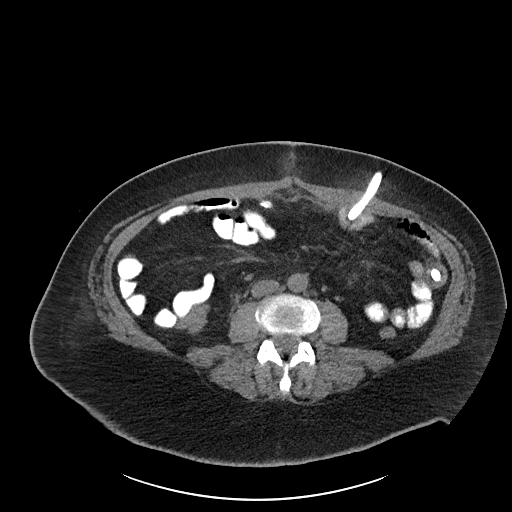
[im 77/118  soft-tissue]
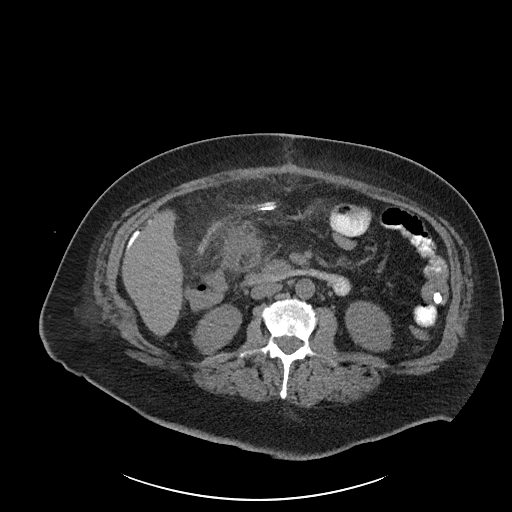
[im 77/118  bone]
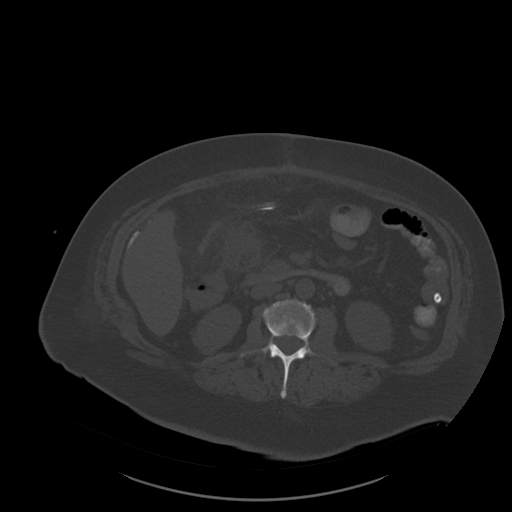
[im 86/118  soft-tissue]
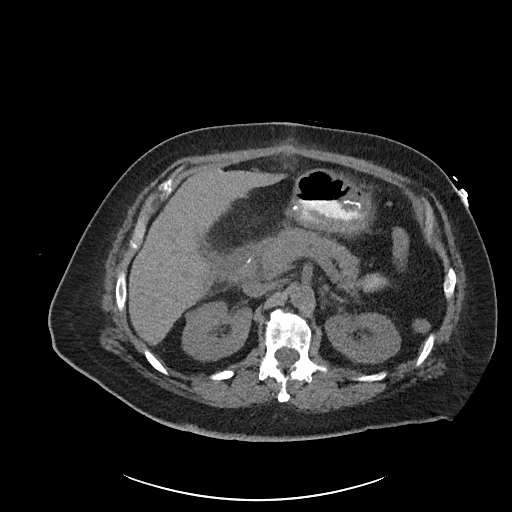
[im 95/118  soft-tissue]
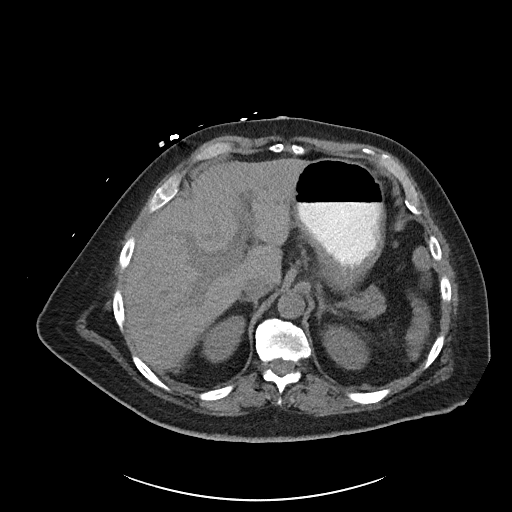
[im 104/118  soft-tissue]
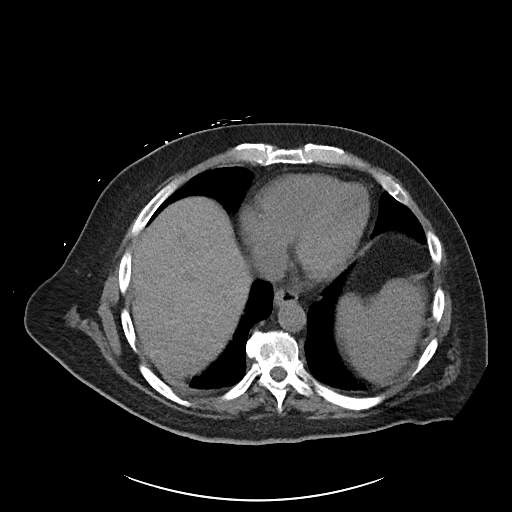
[im 113/118  soft-tissue]
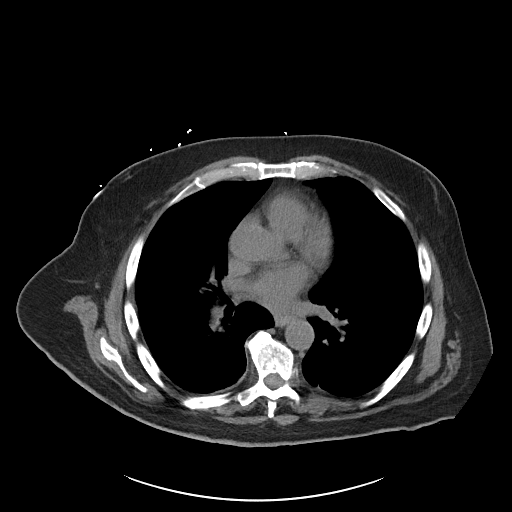

[Series 6: cor st · coronal · 0.97mm/px · 3 of 124 slices shown]
[im 42/124  soft-tissue]
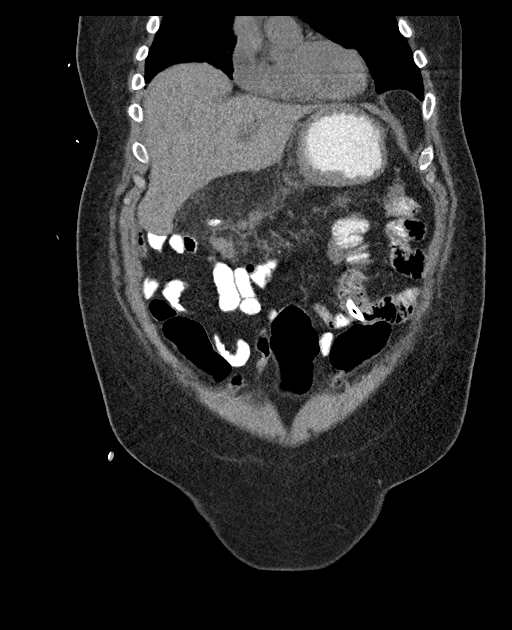
[im 55/124  soft-tissue]
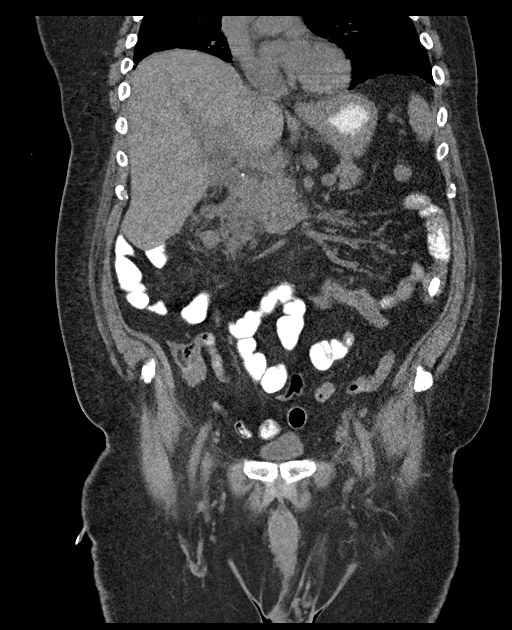
[im 69/124  soft-tissue]
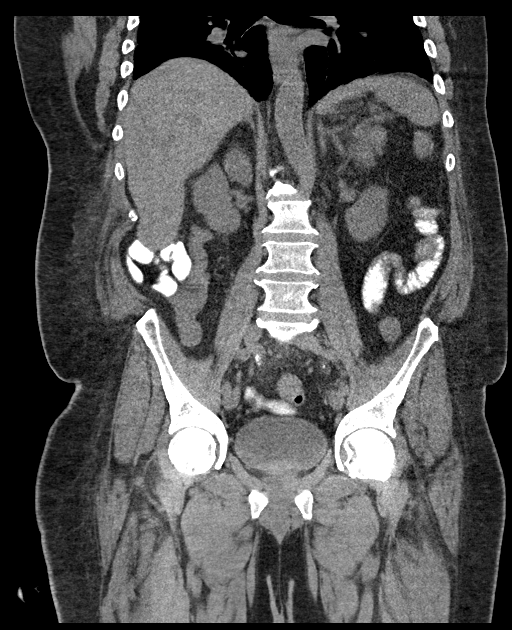

[16 of 46 positions shown; findings below may reference images not displayed]

FINDINGS: Please note that parenchymal abnormalities may be missed without
intravenous contrast.

Lower chest: No acute abnormality.

Hepatobiliary: The liver is unremarkable. The patient is status post
cholecystectomy.

Pancreas: Ill-defined fluid measuring 3.6 x 3.8 cm along the
inferior pancreatic head noted and located posterior to the right
percutaneous strain. The remainder the pancreas is unremarkable.

Spleen: Unremarkable

Adrenals/Urinary Tract: The kidneys, adrenal glands and bladder are
unremarkable except for a small right renal cyst.

Stomach/Bowel: Gastric/ small bowel surgical changes noted without
evidence of definite bowel wall thickening or bowel obstruction. The
appendix is normal. A percutaneous jejunostomy tube is present.

Vascular/Lymphatic: No aneurysm or definite enlarged lymph nodes.

Reproductive: Prostate is unremarkable.

Other: No free fluid, defined collection or pneumoperitoneum
identified.

Musculoskeletal: No acute or suspicious abnormality.
IMPRESSION: 3.8 cm area of ill-defined fluid inferior to the pancreatic head and
posterior to the patient's percutaneous drain. No evidence of bowel
obstruction or pneumoperitoneum.

Gastroenteric postsurgical changes and percutaneous jejunostomy
tube.

## 2018-11-19 MED ORDER — HEPARIN SOD (PORK) LOCK FLUSH 100 UNIT/ML IV SOLN
500.0000 [IU] | Freq: Once | INTRAVENOUS | Status: AC | PRN
  Administered 2018-11-19: 500 [IU] via INTRAVENOUS
  Filled 2018-11-19: qty 5

## 2018-11-19 MED ORDER — SODIUM CHLORIDE 0.9% FLUSH
10.0000 mL | INTRAVENOUS | Status: DC | PRN
  Administered 2018-11-19: 10 mL via INTRAVENOUS
  Filled 2018-11-19: qty 10

## 2018-12-17 IMAGING — CR DG CHEST 1V PORT
1 series · 1 of 1 positions shown · non-contrast
Comparison: 03/01/2016

CLINICAL DATA: Port-A-Cath placement

EXAM:
PORTABLE CHEST 1 VIEW

[AP]
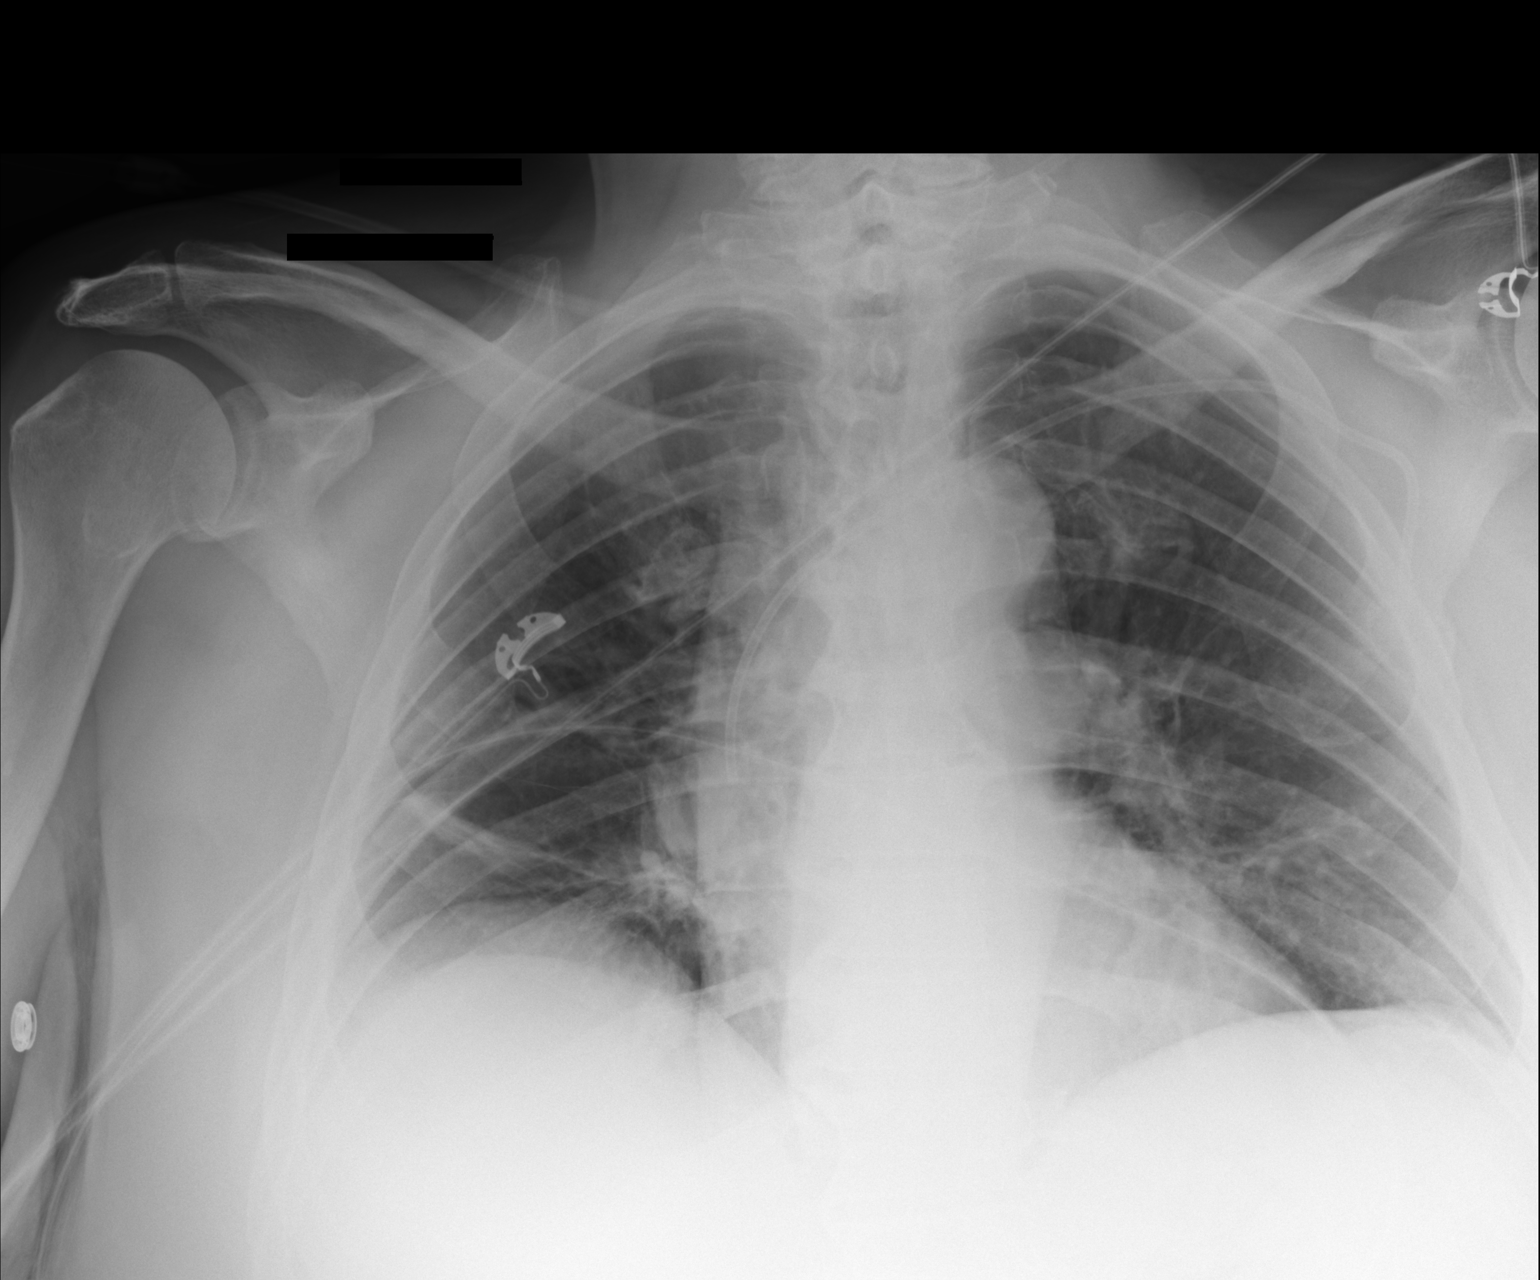

[1 of 1 positions shown; findings below may reference images not displayed]

FINDINGS: Cardiomediastinal silhouette is stable. No infiltrate or pulmonary
edema. There is linear atelectasis right base. There is left
subclavian Port-A-Cath with tip in distal SVC. No pneumothorax.
IMPRESSION: No infiltrate or pulmonary edema. Linear atelectasis noted right
base. Left subclavian Port-A-Cath in place. No pneumothorax.

## 2019-01-14 ENCOUNTER — Inpatient Hospital Stay: Payer: 59 | Attending: Hematology

## 2019-02-11 ENCOUNTER — Telehealth: Payer: Self-pay | Admitting: *Deleted

## 2019-02-11 NOTE — Telephone Encounter (Signed)
Missed port flush appt on 3/2. Wanted to know if he needed to make one before his next one on 5/4. Per Dr. Irene Limbo, can come on 5/4, no need to have sooner appt for flush. Reminded patient of CT scheduled on 4/27. Patient verbalized understanding.

## 2019-02-12 DIAGNOSIS — Z5181 Encounter for therapeutic drug level monitoring: Secondary | ICD-10-CM | POA: Diagnosis not present

## 2019-02-12 DIAGNOSIS — Z1329 Encounter for screening for other suspected endocrine disorder: Secondary | ICD-10-CM | POA: Diagnosis not present

## 2019-02-12 DIAGNOSIS — Z1389 Encounter for screening for other disorder: Secondary | ICD-10-CM | POA: Diagnosis not present

## 2019-02-12 DIAGNOSIS — Z01021 Encounter for examination of eyes and vision following failed vision screening with abnormal findings: Secondary | ICD-10-CM | POA: Diagnosis not present

## 2019-02-12 DIAGNOSIS — Z131 Encounter for screening for diabetes mellitus: Secondary | ICD-10-CM | POA: Diagnosis not present

## 2019-02-12 DIAGNOSIS — Z01118 Encounter for examination of ears and hearing with other abnormal findings: Secondary | ICD-10-CM | POA: Diagnosis not present

## 2019-02-25 ENCOUNTER — Telehealth: Payer: Self-pay | Admitting: Hematology

## 2019-02-25 NOTE — Telephone Encounter (Signed)
Per 4/8 schedule message moved 5/4 lab/port to 4/27 with ct. Left message for patient. Updated schedule mailed.

## 2019-03-11 ENCOUNTER — Ambulatory Visit (HOSPITAL_COMMUNITY)
Admission: RE | Admit: 2019-03-11 | Discharge: 2019-03-11 | Disposition: A | Discharge status: Home / Self Care | Payer: 59 | Source: Ambulatory Visit | Attending: Hematology | Admitting: Hematology

## 2019-03-11 ENCOUNTER — Other Ambulatory Visit: Payer: Self-pay

## 2019-03-11 ENCOUNTER — Inpatient Hospital Stay: Payer: 59 | Attending: Hematology

## 2019-03-11 ENCOUNTER — Encounter (HOSPITAL_COMMUNITY): Payer: Self-pay

## 2019-03-11 ENCOUNTER — Inpatient Hospital Stay: Payer: 59

## 2019-03-11 ENCOUNTER — Other Ambulatory Visit: Payer: Medicare Other

## 2019-03-11 ENCOUNTER — Ambulatory Visit (HOSPITAL_COMMUNITY): Admission: RE | Admit: 2019-03-11 | Payer: Medicare Other | Source: Ambulatory Visit

## 2019-03-11 DIAGNOSIS — Z452 Encounter for adjustment and management of vascular access device: Secondary | ICD-10-CM | POA: Insufficient documentation

## 2019-03-11 DIAGNOSIS — Z95828 Presence of other vascular implants and grafts: Secondary | ICD-10-CM

## 2019-03-11 DIAGNOSIS — C160 Malignant neoplasm of cardia: Secondary | ICD-10-CM | POA: Diagnosis not present

## 2019-03-11 DIAGNOSIS — Z85028 Personal history of other malignant neoplasm of stomach: Secondary | ICD-10-CM | POA: Diagnosis not present

## 2019-03-11 LAB — CMP (CANCER CENTER ONLY)
ALT: 17 U/L (ref 0–44)
AST: 22 U/L (ref 15–41)
Albumin: 3.4 g/dL — ABNORMAL LOW (ref 3.5–5.0)
Alkaline Phosphatase: 85 U/L (ref 38–126)
Anion gap: 10 (ref 5–15)
BUN: 13 mg/dL (ref 8–23)
CO2: 24 mmol/L (ref 22–32)
Calcium: 8.5 mg/dL — ABNORMAL LOW (ref 8.9–10.3)
Chloride: 105 mmol/L (ref 98–111)
Creatinine: 0.98 mg/dL (ref 0.61–1.24)
GFR, Est AFR Am: >60 mL/min (ref >60)
GFR, Estimated: >60 mL/min (ref >60)
Glucose, Bld: 140 mg/dL — ABNORMAL HIGH (ref 70–99)
Potassium: 4.3 mmol/L (ref 3.5–5.1)
Sodium: 139 mmol/L (ref 135–145)
Total Bilirubin: 0.4 mg/dL (ref 0.3–1.2)
Total Protein: 6.9 g/dL (ref 6.5–8.1)

## 2019-03-11 LAB — CBC WITH DIFFERENTIAL/PLATELET
Abs Immature Granulocytes: 0.02 10*3/uL (ref 0.00–0.07)
Basophils Absolute: 0 10*3/uL (ref 0.0–0.1)
Basophils Relative: 0 %
Eosinophils Absolute: 0.1 10*3/uL (ref 0.0–0.5)
Eosinophils Relative: 2 %
HCT: 36.4 % — ABNORMAL LOW (ref 39.0–52.0)
Hemoglobin: 11.5 g/dL — ABNORMAL LOW (ref 13.0–17.0)
Immature Granulocytes: 0 %
Lymphocytes Relative: 17 %
Lymphs Abs: 0.8 10*3/uL (ref 0.7–4.0)
MCH: 27.6 pg (ref 26.0–34.0)
MCHC: 31.6 g/dL (ref 30.0–36.0)
MCV: 87.5 fL (ref 80.0–100.0)
Monocytes Absolute: 0.5 10*3/uL (ref 0.1–1.0)
Monocytes Relative: 10 %
Neutro Abs: 3.3 10*3/uL (ref 1.7–7.7)
Neutrophils Relative %: 71 %
Platelets: 135 10*3/uL — ABNORMAL LOW (ref 150–400)
RBC: 4.16 MIL/uL — ABNORMAL LOW (ref 4.22–5.81)
RDW: 14.1 % (ref 11.5–15.5)
WBC: 4.7 10*3/uL (ref 4.0–10.5)
nRBC: 0 % (ref 0.0–0.2)

## 2019-03-11 LAB — CEA (IN HOUSE-CHCC): CEA (CHCC-In House): 2.67 ng/mL (ref 0.00–5.00)

## 2019-03-11 MED ORDER — HEPARIN SOD (PORK) LOCK FLUSH 100 UNIT/ML IV SOLN
500.0000 [IU] | Freq: Once | INTRAVENOUS | Status: AC
  Administered 2019-03-11: 500 [IU] via INTRAVENOUS

## 2019-03-11 MED ORDER — HEPARIN SOD (PORK) LOCK FLUSH 100 UNIT/ML IV SOLN
INTRAVENOUS | Status: AC
  Filled 2019-03-11: qty 5

## 2019-03-11 MED ORDER — SODIUM CHLORIDE (PF) 0.9 % IJ SOLN
INTRAMUSCULAR | Status: AC
  Filled 2019-03-11: qty 50

## 2019-03-11 MED ORDER — HEPARIN SOD (PORK) LOCK FLUSH 100 UNIT/ML IV SOLN
500.0000 [IU] | Freq: Once | INTRAVENOUS | Status: DC
  Filled 2019-03-11: qty 5

## 2019-03-11 MED ORDER — SODIUM CHLORIDE 0.9% FLUSH
10.0000 mL | INTRAVENOUS | Status: DC | PRN
  Administered 2019-03-11: 11:00:00 10 mL via INTRAVENOUS
  Filled 2019-03-11: qty 10

## 2019-03-11 MED ORDER — IOHEXOL 300 MG/ML  SOLN
30.0000 mL | Freq: Once | INTRAMUSCULAR | Status: AC | PRN
  Administered 2019-03-11: 30 mL via ORAL

## 2019-03-11 MED ORDER — IOHEXOL 300 MG/ML  SOLN
100.0000 mL | Freq: Once | INTRAMUSCULAR | Status: AC | PRN
  Administered 2019-03-11: 100 mL via INTRAVENOUS

## 2019-03-15 NOTE — Progress Notes (Signed)
HEMATOLOGY/ONCOLOGY CLINIC NOTE  Date of Service:  03/18/19     Patient Care Team: [name], MD as PCP - General (Internal Medicine) [name] (Surgery)    CHIEF COMPLAINTS/PURPOSE OF CONSULTATION:   F/u for continued management of gastric adenocarcinoma  HISTORY OF PRESENTING ILLNESS:   [name] is a wonderful 68 y.o. male is here for continued evaluation and management of recently diagnosed pT4a, pN3b, pM1 Gastric Adenocarcinoma.  Patient was previously seen in the hospital on 01/19/2017 with a diagnosis of gastric adenocarcinoma in the gastric pylorus causing significant intrinsic stenosis . Patient had presented to primary care physician with dyspeptic symptoms, partial gastric outlet obstruction and weight loss of 60 pounds. Patient has had a distal gastrectomy, cholecystectomy with jejunal feeding tube placement by Dr. Barry Dienes on 01/10/2017 for gastric obstruction secondary to adenocarcinoma of the gastric pylorus. He was noted to have invaded into the pancreas and a bit of the anterior pancreatic head was resected en bloc with the tumor.  Pathology showed  " INVASIVE MODERATELY TO POORLY DIFFERENTIATED ADENOCARCINOMA, SPANNING 4 CM IN GREATEST DIMENSION. TUMOR INVADES THROUGH STOMACH WALL TO INVOLVE SEROSAL SURFACE.  LYMPH/VASCULAR INVASION IS IDENTIFIED.  MARGINS ARE NEGATIVE.- FIFTEEN OF THIRTY NINE LYMPH NODES POSITIVE FOR METASTATIC ADENOCARCINOMA (15/39). EXTRACAPSULAR EXTENSION IS IDENTIFIED. 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  Patient had a prolonged hospitalization post surgery  due to significant NG tube output. Needed epidural and PCA for pain control. It took a while for him to be able to tolerate progression of 2 feeding. He had an abdominal drain due to a small collection near his pancreatic head. Was treated with Augmentin for this. Patient was in the  hospital from 01/10/2017 and was eventually discharged on 01/27/2017.  Patient postponed several attempts at posthospitalization follow-up. He has been recovering at home and has been optimizing his tube feeding. Patient was recently followed up by Dr. Barry Dienes and has had a port placement on 02/23/2017 and preparation for possible chemotherapy.  He notes that his tube feeding is up to 40 mL per hour for 12 hours and has targeted is 16 ml per hour. He was recently admitted to [facility] in [place] for a C. difficile colitis that caused a severe diarrhea. he is currently on oral vancomycin has completed about 5 days of treatment and is also on probiotics.  CURRENT THERAPY: Active surveillance  PREVIOUS THERAPY:  Concurrent Chemoradiation with Xeloda. He started 07/18/17 and completed 08/25/17  INTERVAL HISTORY   [name] is here for f/u for his gastric adenocarcinoma. The patient's last visit with [name] was on 11/19/18. The pt reports that he is doing well overall.  The pt reports that he has not developed any new concerns in the interim. 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 He denies any changes in bowel habits and denies abdominal pains.  Of note since the patient's last visit, pt has had a CT A/P completed on 03/11/19 with results revealing Status post distal gastrectomy, without findings to suggest local recurrence of disease or definite metastatic disease in the abdomen or pelvis. 2. Colonic diverticulosis without evidence of acute diverticulitis at this time. 3. Incidental findings, as above, similar to prior studies.  Lab results (03/11/19) of CBC w/diff and CMP is as follows: all values are WNL except for RBC at 4.16, HGB at 11.5, HCT at 36.4, PLT at 135k, Glucose at 140, Calcium at 8.5, Albumin at 3.4.  03/11/19 CEA at 2.67  On review of systems, pt reports good energy levels, staying active, eating well, and denies changes in bowel habits, abdominal pains, and any other  symptoms.   MEDICAL HISTORY:  Past Medical History:  Diagnosis Date   Anemia    Cancer ([place]) dx'd 11/2016   Gastric Adenocarcinoma   Gastric outlet obstruction 01/2017   GERD (gastroesophageal reflux disease)    Pre-diabetes    denies   SVT (supraventricular tachycardia) ([place])    during Admission and Surgery- 01/2017    SURGICAL HISTORY: Past Surgical History:  Procedure Laterality Date   CHOLECYSTECTOMY N/A 01/10/2017   Procedure: CHOLECYSTECTOMY;  Surgeon: Stark Klein, MD;  Location: [place];  Service: General;  Laterality: N/A;   COLONOSCOPY     COLONOSCOPY  10/11/2011   Procedure: COLONOSCOPY;  Surgeon: Daneil Dolin, MD;  [facility];  Service: Endoscopy;  Laterality: N/A;  10:30 AM   ESOPHAGOGASTRODUODENOSCOPY N/A 12/30/2016   Procedure: ESOPHAGOGASTRODUODENOSCOPY (EGD);  Surgeon: Carol Ada, MD;  Location: [facility];  Service: Endoscopy;  Laterality: N/A;   GASTRECTOMY N/A 01/10/2017   Procedure: DISTAL GASTRECTOMY, JEJUNUM  FEEDING TUBE PLACEMENT;  Surgeon: Stark Klein, MD;  Location: [place];  Service: General;  Laterality: N/A;   IR GENERIC HISTORICAL  01/24/2017   IR [place] DUODEN/JEJUNO TUBE PERCUT W/FLUORO 01/24/2017 Aletta Edouard, MD [facility]INTER[facility]   IR [facility] DUODEN/JEJUNO TUBE PERCUT W/FLUORO  03/26/2017   PORTACATH PLACEMENT N/A 02/23/2017   Procedure: INSERTION PORT-A-CATH;  Surgeon: Stark Klein, MD;  Location: [place];  Service: General;  Laterality: N/A;   testicle removed  1982    SOCIAL HISTORY: Social History   Socioeconomic History   Marital status: Married    Spouse name: Not on file   Number of children: Not on file   Years of education: Not on file   Highest education level: Not on file  Occupational History   Not on file  Social Needs   Financial resource strain: Not on file   Food insecurity:    Worry: Not on file    Inability: Not on file   Transportation needs:    Medical: Not on file    Non-medical: Not  on file  Tobacco Use   Smoking status: Never Smoker   Smokeless tobacco: Never Used  Substance and Sexual Activity   Alcohol use: No    Comment: occ beer or wine but none in last 6 months    Drug use: No   Sexual activity: Not on file  Lifestyle   Physical activity:    Days per week: Not on file    Minutes per session: Not on file   Stress: Not on file  Relationships   Social connections:    Talks on phone: Not on file    Gets together: Not on file    Attends religious service: Not on file    Active member of club or organization: Not on file    Attends meetings of clubs or organizations: Not on file    Relationship status: Not on file   Intimate partner violence:    Fear of current or ex partner: Not on file    Emotionally abused: Not on file    Physically abused: Not on file    Forced sexual activity: Not on file  Other Topics Concern   Not on file  Social History Narrative   Not on file    FAMILY HISTORY: Family History  Problem Relation Age of  Onset   Prostate cancer Father    Colon cancer Neg Hx     ALLERGIES:  is allergic to no known allergies.  MEDICATIONS:  Current Outpatient Medications  Medication Sig Dispense Refill   ferrous sulfate 325 (65 FE) MG tablet Take 325 mg by mouth daily with breakfast.     lidocaine-prilocaine (EMLA) cream Apply to affected area once 30 g 3   Multiple Vitamin (MULTIVITAMIN) tablet Take 1 tablet by mouth daily.     omeprazole (PRILOSEC) 20 MG capsule Take 1 capsule (20 mg total) by mouth 2 (two) times daily before a meal. (Patient not taking: Reported on 04/19/2018) 60 capsule 2   ondansetron (ZOFRAN) 8 MG tablet Take 1 tablet (8 mg total) by mouth every 8 (eight) hours as needed for refractory nausea / vomiting. Start on day 3 after chemotherapy. (Patient not taking: Reported on 04/19/2018) 30 tablet 1   prochlorperazine (COMPAZINE) 10 MG tablet Take 1 tablet (10 mg total) by mouth every 6 (six) hours as needed  (Nausea or vomiting). (Patient not taking: Reported on 04/19/2018) 30 tablet 1   sucralfate (CARAFATE) 1 GM/10ML suspension Take 10 mLs (1 g total) by mouth 4 (four) times daily -  with meals and at bedtime. (Patient not taking: Reported on 04/19/2018) 420 mL 1   traZODone (DESYREL) 50 MG tablet TAKE 1 TABLET (50 MG TOTAL) BY MOUTH AT BEDTIME AS NEEDED FOR SLEEP. (Patient not taking: Reported on 04/19/2018) 30 tablet 0   No current facility-administered medications for this visit.     REVIEW OF SYSTEMS:    A 10+ POINT REVIEW OF SYSTEMS WAS OBTAINED including neurology, dermatology, psychiatry, cardiac, respiratory, lymph, extremities, GI, GU, Musculoskeletal, constitutional, breasts, reproductive, HEENT.  All pertinent positives are noted in the HPI.  All others are negative.  PHYSICAL EXAMINATION:  ECOG PERFORMANCE STATUS: 2 - Symptomatic, <50% confined to bed  Vitals:   03/18/19 1401  BP: (!) 150/73  Pulse: 71  Resp: 18  Temp: 97.6 F (36.4 C)  SpO2: 100%   Filed Weights   03/18/19 1401  Weight: 294 lb 1.6 oz (133.4 kg)   Body mass index is 41.02 kg/m.   GENERAL:alert, in no acute distress and comfortable SKIN: no acute rashes, no significant lesions EYES: conjunctiva are pink and non-injected, sclera anicteric OROPHARYNX: MMM, no exudates, no oropharyngeal erythema or ulceration NECK: supple, no JVD LYMPH:  no palpable lymphadenopathy in the cervical, axillary or inguinal regions LUNGS: clear to auscultation b/l with normal respiratory effort HEART: regular rate & rhythm ABDOMEN:  normoactive bowel sounds , non tender, not distended. No palpable hepatosplenomegaly.  Extremity: no pedal edema PSYCH: alert & oriented x 3 with fluent speech NEURO: no focal motor/sensory deficits   LABORATORY DATA:  I have reviewed the data as listed  . CBC Latest Ref Rng & Units 03/11/2019 11/19/2018 07/13/2018  WBC 4.0 - 10.5 K/uL 4.7 4.6 3.2(L)  Hemoglobin 13.0 - 17.0 g/dL 11.5(L) 11.9(L)  12.0(L)  Hematocrit 39.0 - 52.0 % 36.4(L) 36.2(L) 36.2(L)  Platelets 150 - 400 K/uL 135(L) 145(L) 130(L)    . CMP Latest Ref Rng & Units 03/11/2019 11/19/2018 07/13/2018  Glucose 70 - 99 mg/dL 140(H) 124(H) 113(H)  BUN 8 - 23 mg/dL 13 16 16   Creatinine 0.61 - 1.24 mg/dL 0.98 0.96 1.01  Sodium 135 - 145 mmol/L 139 140 142  Potassium 3.5 - 5.1 mmol/L 4.3 4.3 4.2  Chloride 98 - 111 mmol/L 105 107 106  CO2 22 - 32 mmol/L  24 26 27   Calcium 8.9 - 10.3 mg/dL 8.5(L) 9.1 9.3  Total Protein 6.5 - 8.1 g/dL 6.9 6.9 7.2  Total Bilirubin 0.3 - 1.2 mg/dL 0.4 0.5 0.6  Alkaline Phos 38 - 126 U/L 85 85 89  AST 15 - 41 U/L 22 19 22   ALT 0 - 44 U/L 17 19 16       RADIOGRAPHIC STUDIES: I have personally reviewed the radiological images as listed and agreed with the findings in the report. Ct Abdomen Pelvis W Contrast  Result Date: 03/11/2019 CLINICAL DATA:  68 year old male with history of gastric adenocarcinoma status post chemotherapy and radiation therapy now complete. EXAM: CT ABDOMEN AND PELVIS WITH CONTRAST TECHNIQUE: Multidetector CT imaging of the abdomen and pelvis was performed using the standard protocol following bolus administration of intravenous contrast. CONTRAST:  190mL OMNIPAQUE IOHEXOL 300 MG/ML  SOLN COMPARISON:  CT the chest, abdomen and pelvis 07/13/2018. FINDINGS: Lower chest: Small hiatal hernia. Hepatobiliary: No suspicious cystic or solid hepatic lesions. No intra or extrahepatic biliary ductal dilatation. Status post cholecystectomy. Pancreas: No pancreatic mass. No pancreatic ductal dilatation. No pancreatic or peripancreatic fluid or inflammatory changes. Spleen: Unremarkable. Adrenals/Urinary Tract: Bilateral kidneys and adrenal glands are normal in appearance. No hydroureteronephrosis. Urinary bladder is normal in appearance. Stomach/Bowel: Postoperative changes of distal gastrectomy. No definite soft tissue mass associated with the remaining portions of the stomach to suggest local  recurrence of disease. No pathologic dilatation of small bowel or colon. Scattered colonic diverticulae are noted, without surrounding inflammatory changes to suggest an acute diverticulitis at this time. Normal appendix. Vascular/Lymphatic: Atherosclerotic calcifications noted the pelvic vasculature, without evidence of aneurysm or dissection in the abdomen or pelvis. No lymphadenopathy noted in the abdomen or pelvis. Reproductive: Prostate gland and seminal vesicles are unremarkable in appearance. Other: No significant volume of ascites.  No pneumoperitoneum. Musculoskeletal: There are no aggressive appearing lytic or blastic lesions noted in the visualized portions of the skeleton. IMPRESSION: 1. Status post distal gastrectomy, without findings to suggest local recurrence of disease or definite metastatic disease in the abdomen or pelvis. 2. Colonic diverticulosis without evidence of acute diverticulitis at this time. 3. Incidental findings, as above, similar to prior studies. Electronically Signed   By: Vinnie Langton M.D.   On: 03/11/2019 16:30    ASSESSMENT & PLAN:   68 y.o.  African-American male with  1) Resected Stage IV pT4a, pN3b, pM1 invasive moderately-poorly Gastric Adenocarcinoma . Her 2 neg by FISH LVI + margins neg 15/39 LN +ve, Extracapsular invasion +, some involvement of pancreatic capsule there pM1 Presented with severe pyloric stenosis with ulceration. S/p 60 pound weight loss- now resolved. S/p distal gastrectomy, cholecystectomy and en bloc dissection off the small part of the pancreatic head  with jejunal feeding tube placement. Initial CT abdomen and pelvis did not show any local regional lymphadenopathy. Rpt CT chest/abd/pelvis from 03/22/2017 - no overt evidence of residual disease or metastatic disease. Post-surgical peripancreatic fluid collection resolving. CT chest/abd/pelvis 06/21/2017 after 6 cycles of FOLFOX -  No evidence of residual or metastatic carcinoma within  the chest, abdomen, or pelvis -Started Chemoradiation on 07/18/17 with Xeloda (825mg /m2 twice daily Monday through Friday while on radiation) and completed treatment on 08/24/2017. CT chest/abd/pelvis (11/16/2017) - show radiation related changes but no evidence of tumor progression at this time. 07/13/18 CT C/A/P revealed No evidence of metastatic disease in the chest, abdomen or pelvis. Previously described mildly enlarged high left para-aortic retroperitoneal node is decreased and now normal in size.  Previously described peripancreatic fat stranding is decreased and probably treatment related.   #2 s/p moderate protein calorie malnutrition patient had lost about 60 pounds in the last 2-3 months but has been gaining back his lost weight well. . Wt Readings from Last 3 Encounters:  03/18/19 294 lb 1.6 oz (133.4 kg)  11/19/18 291 lb 3.2 oz (132.1 kg)  07/20/18 279 lb 11.2 oz (126.9 kg)   #3 status post port placement on 02/23/2017 by dr. Barry Dienes   #4 h/o Clostridium difficile colitis -. No overt issues with diarrhea currently.   #5 suspected sleep apnea.Has not had a sleep study.Wife notes significant snoring which is important with weight loss. Will have to be careful with excessive sedation. -continue prn low dose trazodone for insomnia -recommended he f/u with his PCP for consideration of sleep study-pending   PLAN: -Discussed pt labwork  From 03/11/19; WBC normalized at 4.7k. HGB stable at 11.5. Other blood counts and chemistries are stable. CEA is within normal limits at 2.67. -Discussed the 03/11/19 CT A/P which revealed Status post distal gastrectomy, without findings to suggest local recurrence of disease or definite metastatic disease in the abdomen or pelvis. 2. Colonic diverticulosis without evidence of acute diverticulitis at this time. 3. Incidental findings, as above, similar to prior studies. -The pt shows no clinical or lab progression/return of his gastric adenocarcinoma at this  time. -No indication for further treatment at this time. -Counseled the pt towards dietary improvement for his desired weight loss -Prevnar in March 2019, Pneumovax in March 2018, obtained annual flu vaccine as well. -Defer question of 81mg  aspirin daily to PCP for primary prevention strategy -Patient will discuss port removal recommendations with surgeon Dr. Barry Dienes. Okay from my perspective to remove port -Will continue surveillance with CT Scan every other visit and f/u every 3-4 months for the first 3 years. -Will see the pt back in 12 weeks   RTC with Dr Irene Limbo with labs and portflush appointment in 12 weeks   All of the patients questions were answered with apparent satisfaction. The patient knows to call the clinic with any problems, questions or concerns.   The total time spent in the appt was 20 minutes and more than 50% was on counseling and direct patient cares.    Sullivan Lone MD [place] AAHIVMS [facility] Hematology/Oncology Physician [facility]  (Office):       [phone] (Work cell):  [phone] (Fax):           [phone]  I, Baldwin [place], am acting as a scribe for Dr. Sullivan Lone.   .I have reviewed the above documentation for accuracy and completeness, and I agree with the above. Brunetta Genera MD

## 2019-03-18 ENCOUNTER — Other Ambulatory Visit: Payer: Medicare Other

## 2019-03-18 ENCOUNTER — Inpatient Hospital Stay: Payer: 59 | Attending: Hematology | Admitting: Hematology

## 2019-03-18 ENCOUNTER — Telehealth: Payer: Self-pay | Admitting: Hematology

## 2019-03-18 ENCOUNTER — Other Ambulatory Visit: Payer: Self-pay

## 2019-03-18 VITALS — BP 150/73 | HR 71 | Temp 97.6°F | Resp 18 | Ht 71.0 in | Wt 294.1 lb

## 2019-03-18 DIAGNOSIS — Z79899 Other long term (current) drug therapy: Secondary | ICD-10-CM | POA: Insufficient documentation

## 2019-03-18 DIAGNOSIS — Z85028 Personal history of other malignant neoplasm of stomach: Secondary | ICD-10-CM | POA: Diagnosis not present

## 2019-03-18 DIAGNOSIS — C160 Malignant neoplasm of cardia: Secondary | ICD-10-CM

## 2019-03-18 DIAGNOSIS — Z95828 Presence of other vascular implants and grafts: Secondary | ICD-10-CM

## 2019-03-18 NOTE — Telephone Encounter (Signed)
Scheduled appt per 5/4 los.  A calendar will be mailed out.

## 2019-06-08 NOTE — Progress Notes (Signed)
HEMATOLOGY/ONCOLOGY CLINIC NOTE  Date of Service:  06/10/19     Patient Care Team: [name], MD as PCP - General (Internal Medicine) [name] (Surgery)    CHIEF COMPLAINTS/PURPOSE OF CONSULTATION:   F/u for continued management of gastric adenocarcinoma  HISTORY OF PRESENTING ILLNESS:   [name] is a wonderful 69 y.o. male is here for continued evaluation and management of recently diagnosed pT4a, pN3b, pM1 Gastric Adenocarcinoma.  Patient was previously seen in the hospital on 01/19/2017 with a diagnosis of gastric adenocarcinoma in the gastric pylorus causing significant intrinsic stenosis . Patient had presented to primary care physician with dyspeptic symptoms, partial gastric outlet obstruction and weight loss of 60 pounds. Patient has had a distal gastrectomy, cholecystectomy with jejunal feeding tube placement by Dr. Barry Dienes on 01/10/2017 for gastric obstruction secondary to adenocarcinoma of the gastric pylorus. He was noted to have invaded into the pancreas and a bit of the anterior pancreatic head was resected en bloc with the tumor.  Pathology showed  " INVASIVE MODERATELY TO POORLY DIFFERENTIATED ADENOCARCINOMA, SPANNING 4 CM IN GREATEST DIMENSION. TUMOR INVADES THROUGH STOMACH WALL TO INVOLVE SEROSAL SURFACE.  LYMPH/VASCULAR INVASION IS IDENTIFIED.  MARGINS ARE NEGATIVE.- FIFTEEN OF THIRTY NINE LYMPH NODES POSITIVE FOR METASTATIC ADENOCARCINOMA (15/39). EXTRACAPSULAR EXTENSION IS IDENTIFIED. 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  Patient had a prolonged hospitalization post surgery  due to significant NG tube output. Needed epidural and PCA for pain control. It took a while for him to be able to tolerate progression of 2 feeding. He had an abdominal drain due to a small collection near his pancreatic head. Was treated with Augmentin for this. Patient was in the  hospital from 01/10/2017 and was eventually discharged on 01/27/2017.  Patient postponed several attempts at posthospitalization follow-up. He has been recovering at home and has been optimizing his tube feeding. Patient was recently followed up by Dr. Barry Dienes and has had a port placement on 02/23/2017 and preparation for possible chemotherapy.  He notes that his tube feeding is up to 40 mL per hour for 12 hours and has targeted is 16 ml per hour. He was recently admitted to [facility] in [place] for a C. difficile colitis that caused a severe diarrhea. he is currently on oral vancomycin has completed about 5 days of treatment and is also on probiotics.  CURRENT THERAPY: Active surveillance  PREVIOUS THERAPY:  Concurrent Chemoradiation with Xeloda. He started 07/18/17 and completed 08/25/17  INTERVAL HISTORY   [name] is here for f/u for his gastric adenocarcinoma. The patient's last visit with [name] was on 03/18/2019. The pt reports that he is doing well overall.  The pt reports some weight gain, which he thinks is due to his eating habits. [place]. His PCP put him on Metformin about a month ago.  Lab results today (06/10/2019) of CBC w/diff and CMP is as follows: all values are WNL except for RBC at 4.20, HGB at 11.9, HCT 36.6, PLT at 121k. 06/10/2019 Vit B12 is 775 06/10/2019 Ferritin is 100 06/10/2019 CMP is wnl  On review of systems, pt reports some weight gain and denies changing in bowel habits, leg edema, back pain and any other symptoms.    MEDICAL HISTORY:  Past Medical History:  Diagnosis Date  . Anemia   . Cancer ([place]) dx'd 11/2016   Gastric Adenocarcinoma  . Gastric outlet obstruction 01/2017  . GERD (gastroesophageal  reflux disease)   . Pre-diabetes    denies  . SVT (supraventricular tachycardia) ([place])    during Admission and Surgery- 01/2017    SURGICAL HISTORY: Past Surgical History:  Procedure Laterality Date  . CHOLECYSTECTOMY N/A  01/10/2017   Procedure: CHOLECYSTECTOMY;  Surgeon: Stark Klein, MD;  [facility];  Service: General;  Laterality: N/A;  . COLONOSCOPY    . COLONOSCOPY  10/11/2011   Procedure: COLONOSCOPY;  Surgeon: Daneil Dolin, MD;  [facility];  Service: Endoscopy;  Laterality: N/A;  10:30 AM  . ESOPHAGOGASTRODUODENOSCOPY N/A 12/30/2016   Procedure: ESOPHAGOGASTRODUODENOSCOPY (EGD);  Surgeon: Carol Ada, MD;  [facility];  Service: Endoscopy;  Laterality: N/A;  . GASTRECTOMY N/A 01/10/2017   Procedure: DISTAL GASTRECTOMY, JEJUNUM  FEEDING TUBE PLACEMENT;  Surgeon: Stark Klein, MD;  Location: [facility];  Service: General;  Laterality: N/A;  . IR GENERIC HISTORICAL  01/24/2017   IR [place] DUODEN/JEJUNO TUBE PERCUT W/FLUORO 01/24/2017 Aletta Edouard, MD [facility]  . IR REPLC DUODEN/JEJUNO TUBE PERCUT W/FLUORO  03/26/2017  . PORTACATH PLACEMENT N/A 02/23/2017   Procedure: INSERTION PORT-A-CATH;  Surgeon: Stark Klein, MD;  Location: [facility];  Service: General;  Laterality: N/A;  . testicle removed  1982    SOCIAL HISTORY: Social History   Socioeconomic History  . Marital status: Married    Spouse name: Not on file  . Number of children: Not on file  . Years of education: Not on file  . Highest education level: Not on file  Occupational History  . Not on file  Social Needs  . Financial resource strain: Not on file  . Food insecurity    Worry: Not on file    Inability: Not on file  . Transportation needs    Medical: Not on file    Non-medical: Not on file  Tobacco Use  . Smoking status: Never Smoker  . Smokeless tobacco: Never Used  Substance and Sexual Activity  . Alcohol use: No    Comment: occ beer or wine but none in last 6 months   . Drug use: No  . Sexual activity: Not on file  Lifestyle  . Physical activity    Days per week: Not on file    Minutes per session: Not on file  . Stress: Not on file  Relationships  . Social Herbalist on phone: Not on  file    Gets together: Not on file    Attends religious service: Not on file    Active member of club or organization: Not on file    Attends meetings of clubs or organizations: Not on file    Relationship status: Not on file  . Intimate partner violence    Fear of current or ex partner: Not on file    Emotionally abused: Not on file    Physically abused: Not on file    Forced sexual activity: Not on file  Other Topics Concern  . Not on file  Social History Narrative  . Not on file    FAMILY HISTORY: Family History  Problem Relation Age of Onset  . Prostate cancer Father   . Colon cancer Neg Hx     ALLERGIES:  is allergic to no known allergies.  MEDICATIONS:  Current Outpatient Medications  Medication Sig Dispense Refill  . ferrous sulfate 325 (65 FE) MG tablet Take 325 mg by mouth daily with breakfast.    . lidocaine-prilocaine (EMLA) cream Apply to affected area once  30 g 3  . Multiple Vitamin (MULTIVITAMIN) tablet Take 1 tablet by mouth daily.    [name] Kitchen omeprazole (PRILOSEC) 20 MG capsule Take 1 capsule (20 mg total) by mouth 2 (two) times daily before a meal. (Patient not taking: Reported on 04/19/2018) 60 capsule 2  . ondansetron (ZOFRAN) 8 MG tablet Take 1 tablet (8 mg total) by mouth every 8 (eight) hours as needed for refractory nausea / vomiting. Start on day 3 after chemotherapy. (Patient not taking: Reported on 04/19/2018) 30 tablet 1  . prochlorperazine (COMPAZINE) 10 MG tablet Take 1 tablet (10 mg total) by mouth every 6 (six) hours as needed (Nausea or vomiting). (Patient not taking: Reported on 04/19/2018) 30 tablet 1  . sucralfate (CARAFATE) 1 GM/10ML suspension Take 10 mLs (1 g total) by mouth 4 (four) times daily -  with meals and at bedtime. (Patient not taking: Reported on 04/19/2018) 420 mL 1  . traZODone (DESYREL) 50 MG tablet TAKE 1 TABLET (50 MG TOTAL) BY MOUTH AT BEDTIME AS NEEDED FOR SLEEP. (Patient not taking: Reported on 04/19/2018) 30 tablet 0   No current  facility-administered medications for this visit.     REVIEW OF SYSTEMS:    A 10+ POINT REVIEW OF SYSTEMS WAS OBTAINED including neurology, dermatology, psychiatry, cardiac, respiratory, lymph, extremities, GI, GU, Musculoskeletal, constitutional, breasts, reproductive, HEENT.  All pertinent positives are noted in the HPI.  All others are negative.   PHYSICAL EXAMINATION:  ECOG PERFORMANCE STATUS: 2 - Symptomatic, <50% confined to bed  Vitals:   06/10/19 1407  BP: (!) 143/52  Pulse: 68  Resp: 18  Temp: 98 F (36.7 C)  SpO2: 100%   Filed Weights   06/10/19 1407  Weight: 298 lb 12.8 oz (135.5 kg)   Body mass index is 41.67 kg/m.   GENERAL:alert, in no acute distress and comfortable SKIN: no acute rashes, no significant lesions EYES: conjunctiva are pink and non-injected, sclera anicteric OROPHARYNX: MMM, no exudates, no oropharyngeal erythema or ulceration NECK: supple, no JVD LYMPH:  no palpable lymphadenopathy in the cervical, axillary or inguinal regions LUNGS: clear to auscultation b/l with normal respiratory effort HEART: regular rate & rhythm ABDOMEN:  normoactive bowel sounds , non tender, not distended. No palpable hepatosplenomegaly.  Extremity: no pedal edema PSYCH: alert & oriented x 3 with fluent speech NEURO: no focal motor/sensory deficits   LABORATORY DATA:  I have reviewed the data as listed  . CBC Latest Ref Rng & Units 06/10/2019 03/11/2019 11/19/2018  WBC 4.0 - 10.5 K/uL 4.7 4.7 4.6  Hemoglobin 13.0 - 17.0 g/dL 11.9(L) 11.5(L) 11.9(L)  Hematocrit 39.0 - 52.0 % 36.6(L) 36.4(L) 36.2(L)  Platelets 150 - 400 K/uL 121(L) 135(L) 145(L)    . CMP Latest Ref Rng & Units 03/11/2019 11/19/2018 07/13/2018  Glucose 70 - 99 mg/dL 140(H) 124(H) 113(H)  BUN 8 - 23 mg/dL 13 16 16   Creatinine 0.61 - 1.24 mg/dL 0.98 0.96 1.01  Sodium 135 - 145 mmol/L 139 140 142  Potassium 3.5 - 5.1 mmol/L 4.3 4.3 4.2  Chloride 98 - 111 mmol/L 105 107 106  CO2 22 - 32 mmol/L 24 26 27    Calcium 8.9 - 10.3 mg/dL 8.5(L) 9.1 9.3  Total Protein 6.5 - 8.1 g/dL 6.9 6.9 7.2  Total Bilirubin 0.3 - 1.2 mg/dL 0.4 0.5 0.6  Alkaline Phos 38 - 126 U/L 85 85 89  AST 15 - 41 U/L 22 19 22   ALT 0 - 44 U/L 17 19 16  RADIOGRAPHIC STUDIES: I have personally reviewed the radiological images as listed and agreed with the findings in the report. No results found.  ASSESSMENT & PLAN:   68 y.o.  African-American male with  1) Resected Stage IV pT4a, pN3b, pM1 invasive moderately-poorly Gastric Adenocarcinoma . Her 2 neg by FISH LVI + margins neg 15/39 LN +ve, Extracapsular invasion +, some involvement of pancreatic capsule there pM1 Presented with severe pyloric stenosis with ulceration. S/p 60 pound weight loss- now resolved. S/p distal gastrectomy, cholecystectomy and en bloc dissection off the small part of the pancreatic head  with jejunal feeding tube placement. Initial CT abdomen and pelvis did not show any local regional lymphadenopathy. Rpt CT chest/abd/pelvis from 03/22/2017 - no overt evidence of residual disease or metastatic disease. Post-surgical peripancreatic fluid collection resolving. CT chest/abd/pelvis 06/21/2017 after 6 cycles of FOLFOX -  No evidence of residual or metastatic carcinoma within the chest, abdomen, or pelvis -Started Chemoradiation on 07/18/17 with Xeloda (825mg /m2 twice daily Monday through Friday while on radiation) and completed treatment on 08/24/2017. CT chest/abd/pelvis (11/16/2017) - show radiation related changes but no evidence of tumor progression at this time.  07/13/18 CT C/A/P revealed No evidence of metastatic disease in the chest, abdomen or pelvis. Previously described mildly enlarged high left para-aortic retroperitoneal node is decreased and now normal in size. Previously described peripancreatic fat stranding is decreased and probably treatment related.   03/11/19 CT A/P revealed "Status post distal gastrectomy, without findings to suggest  local recurrence of disease or definite metastatic disease in the abdomen or pelvis. 2. Colonic diverticulosis without evidence of acute diverticulitis at this time. 3. Incidental findings, as above, similar to prior studies."  #2 s/p moderate protein calorie malnutrition patient had lost about 60 pounds in the last 2-3 months but has been gaining back his lost weight well. . Wt Readings from Last 3 Encounters:  06/10/19 298 lb 12.8 oz (135.5 kg)  03/18/19 294 lb 1.6 oz (133.4 kg)  11/19/18 291 lb 3.2 oz (132.1 kg)   #3 status post port placement on 02/23/2017 by dr. Barry Dienes   #4 h/o Clostridium difficile colitis -. No overt issues with diarrhea currently.   #5 suspected sleep apnea.Has not had a sleep study.Wife notes significant snoring which is important with weight loss. Will have to be careful with excessive sedation. -continue prn low dose trazodone for insomnia -recommended he f/u with his PCP for consideration of sleep study-pending   PLAN: -Discussed pt labwork today, 06/10/2019; blood counts are stable, blood chemistries are pending -The pt shows no clinical or lab progression/return of his gastric adenocarcinoma at this time. -No indication for further treatment at this time. -Patient will discuss port removal recommendations with surgeon Dr. Barry Dienes. Okay from my perspective to remove port -Will continue surveillance with CT Scan every other visit and f/u every 3-4 months for the first 3 years. -Recommended that the pt awork on diet and exercise to maintain ideal body weight and drink at least 48-64 oz of water each day, and walk 20-30 minutes each day.  -Will see the pt back in 4 months with CT C/A/P prior to visit   Ct chest/abd/pelvis in 16 weeks with labs and portflush Clinic visit with Dr Irene Limbo in 4 months   All of the patients questions were answered with apparent satisfaction. The patient knows to call the clinic with any problems, questions or concerns.  The total  time spent in the appt was 20 minutes and more than 50% was on  counseling and direct patient cares.   Sullivan Lone MD [place] AAHIVMS [facility] Hematology/Oncology Physician [facility]  (Office):       [phone] (Work cell):  [phone] (Fax):           [phone]  I, De Burrs, am acting as a scribe for Dr. Irene Limbo  .I have reviewed the above documentation for accuracy and completeness, and I agree with the above. Brunetta Genera MD

## 2019-06-10 ENCOUNTER — Telehealth: Payer: Self-pay | Admitting: Hematology

## 2019-06-10 ENCOUNTER — Other Ambulatory Visit: Payer: Self-pay

## 2019-06-10 ENCOUNTER — Inpatient Hospital Stay: Payer: 59 | Attending: Hematology

## 2019-06-10 ENCOUNTER — Inpatient Hospital Stay: Payer: 59

## 2019-06-10 ENCOUNTER — Inpatient Hospital Stay (HOSPITAL_BASED_OUTPATIENT_CLINIC_OR_DEPARTMENT_OTHER): Payer: 59 | Admitting: Hematology

## 2019-06-10 VITALS — BP 143/52 | HR 68 | Temp 98.0°F | Resp 18 | Ht 71.0 in | Wt 298.8 lb

## 2019-06-10 DIAGNOSIS — C160 Malignant neoplasm of cardia: Secondary | ICD-10-CM

## 2019-06-10 DIAGNOSIS — E440 Moderate protein-calorie malnutrition: Secondary | ICD-10-CM | POA: Insufficient documentation

## 2019-06-10 DIAGNOSIS — C169 Malignant neoplasm of stomach, unspecified: Secondary | ICD-10-CM

## 2019-06-10 DIAGNOSIS — Z85028 Personal history of other malignant neoplasm of stomach: Secondary | ICD-10-CM | POA: Diagnosis not present

## 2019-06-10 DIAGNOSIS — Z7189 Other specified counseling: Secondary | ICD-10-CM

## 2019-06-10 DIAGNOSIS — Z95828 Presence of other vascular implants and grafts: Secondary | ICD-10-CM

## 2019-06-10 LAB — CBC WITH DIFFERENTIAL/PLATELET
Abs Immature Granulocytes: 0.01 10*3/uL (ref 0.00–0.07)
Basophils Absolute: 0 10*3/uL (ref 0.0–0.1)
Basophils Relative: 0 %
Eosinophils Absolute: 0.1 10*3/uL (ref 0.0–0.5)
Eosinophils Relative: 1 %
HCT: 36.6 % — ABNORMAL LOW (ref 39.0–52.0)
Hemoglobin: 11.9 g/dL — ABNORMAL LOW (ref 13.0–17.0)
Immature Granulocytes: 0 %
Lymphocytes Relative: 18 %
Lymphs Abs: 0.9 10*3/uL (ref 0.7–4.0)
MCH: 28.3 pg (ref 26.0–34.0)
MCHC: 32.5 g/dL (ref 30.0–36.0)
MCV: 87.1 fL (ref 80.0–100.0)
Monocytes Absolute: 0.4 10*3/uL (ref 0.1–1.0)
Monocytes Relative: 9 %
Neutro Abs: 3.3 10*3/uL (ref 1.7–7.7)
Neutrophils Relative %: 72 %
Platelets: 121 10*3/uL — ABNORMAL LOW (ref 150–400)
RBC: 4.2 MIL/uL — ABNORMAL LOW (ref 4.22–5.81)
RDW: 14.5 % (ref 11.5–15.5)
WBC: 4.7 10*3/uL (ref 4.0–10.5)
nRBC: 0 % (ref 0.0–0.2)

## 2019-06-10 LAB — FERRITIN: Ferritin: 100 ng/mL (ref 24–336)

## 2019-06-10 LAB — CMP (CANCER CENTER ONLY)
ALT: 17 U/L (ref 0–44)
AST: 18 U/L (ref 15–41)
Albumin: 3.7 g/dL (ref 3.5–5.0)
Alkaline Phosphatase: 78 U/L (ref 38–126)
Anion gap: 8 (ref 5–15)
BUN: 13 mg/dL (ref 8–23)
CO2: 24 mmol/L (ref 22–32)
Calcium: 9 mg/dL (ref 8.9–10.3)
Chloride: 109 mmol/L (ref 98–111)
Creatinine: 1.06 mg/dL (ref 0.61–1.24)
GFR, Est AFR Am: >60 mL/min (ref >60)
GFR, Estimated: >60 mL/min (ref >60)
Glucose, Bld: 125 mg/dL — ABNORMAL HIGH (ref 70–99)
Potassium: 3.9 mmol/L (ref 3.5–5.1)
Sodium: 141 mmol/L (ref 135–145)
Total Bilirubin: 0.5 mg/dL (ref 0.3–1.2)
Total Protein: 7.2 g/dL (ref 6.5–8.1)

## 2019-06-10 LAB — VITAMIN B12: Vitamin B-12: 775 pg/mL (ref 180–914)

## 2019-06-10 MED ORDER — SODIUM CHLORIDE 0.9% FLUSH
10.0000 mL | INTRAVENOUS | Status: DC | PRN
  Administered 2019-06-10: 10 mL via INTRAVENOUS
  Filled 2019-06-10: qty 10

## 2019-06-10 MED ORDER — HEPARIN SOD (PORK) LOCK FLUSH 100 UNIT/ML IV SOLN
500.0000 [IU] | Freq: Once | INTRAVENOUS | Status: AC | PRN
  Administered 2019-06-10: 500 [IU] via INTRAVENOUS
  Filled 2019-06-10: qty 5

## 2019-06-10 NOTE — Telephone Encounter (Signed)
Scheduled appt per 7/27 los.  Printed and mailed appt calendar along with the number to central radiology.

## 2019-06-13 DIAGNOSIS — E1165 Type 2 diabetes mellitus with hyperglycemia: Secondary | ICD-10-CM | POA: Diagnosis not present

## 2019-08-06 DIAGNOSIS — K219 Gastro-esophageal reflux disease without esophagitis: Secondary | ICD-10-CM | POA: Diagnosis not present

## 2019-08-06 DIAGNOSIS — R1013 Epigastric pain: Secondary | ICD-10-CM | POA: Diagnosis not present

## 2019-08-06 DIAGNOSIS — D638 Anemia in other chronic diseases classified elsewhere: Secondary | ICD-10-CM | POA: Diagnosis not present

## 2019-08-06 DIAGNOSIS — E1165 Type 2 diabetes mellitus with hyperglycemia: Secondary | ICD-10-CM | POA: Diagnosis not present

## 2019-08-06 DIAGNOSIS — E782 Mixed hyperlipidemia: Secondary | ICD-10-CM | POA: Diagnosis not present

## 2019-09-26 DIAGNOSIS — R1013 Epigastric pain: Secondary | ICD-10-CM | POA: Diagnosis not present

## 2019-09-26 DIAGNOSIS — E1165 Type 2 diabetes mellitus with hyperglycemia: Secondary | ICD-10-CM | POA: Diagnosis not present

## 2019-09-26 DIAGNOSIS — D638 Anemia in other chronic diseases classified elsewhere: Secondary | ICD-10-CM | POA: Diagnosis not present

## 2019-09-26 DIAGNOSIS — K219 Gastro-esophageal reflux disease without esophagitis: Secondary | ICD-10-CM | POA: Diagnosis not present

## 2019-09-26 DIAGNOSIS — E782 Mixed hyperlipidemia: Secondary | ICD-10-CM | POA: Diagnosis not present

## 2019-09-26 DIAGNOSIS — Z1389 Encounter for screening for other disorder: Secondary | ICD-10-CM | POA: Diagnosis not present

## 2019-09-30 ENCOUNTER — Other Ambulatory Visit: Payer: Self-pay

## 2019-09-30 ENCOUNTER — Ambulatory Visit (HOSPITAL_COMMUNITY)
Admission: RE | Admit: 2019-09-30 | Discharge: 2019-09-30 | Disposition: A | Discharge status: Home / Self Care | Payer: 59 | Source: Ambulatory Visit | Attending: Hematology | Admitting: Hematology

## 2019-09-30 ENCOUNTER — Inpatient Hospital Stay: Payer: 59

## 2019-09-30 ENCOUNTER — Inpatient Hospital Stay: Payer: 59 | Attending: Hematology

## 2019-09-30 DIAGNOSIS — C160 Malignant neoplasm of cardia: Secondary | ICD-10-CM

## 2019-09-30 DIAGNOSIS — C169 Malignant neoplasm of stomach, unspecified: Secondary | ICD-10-CM | POA: Insufficient documentation

## 2019-09-30 DIAGNOSIS — Z7189 Other specified counseling: Secondary | ICD-10-CM

## 2019-09-30 DIAGNOSIS — Z452 Encounter for adjustment and management of vascular access device: Secondary | ICD-10-CM | POA: Insufficient documentation

## 2019-09-30 DIAGNOSIS — Z95828 Presence of other vascular implants and grafts: Secondary | ICD-10-CM

## 2019-09-30 LAB — CBC WITH DIFFERENTIAL/PLATELET
Abs Immature Granulocytes: 0.01 10*3/uL (ref 0.00–0.07)
Basophils Absolute: 0 10*3/uL (ref 0.0–0.1)
Basophils Relative: 0 %
Eosinophils Absolute: 0.1 10*3/uL (ref 0.0–0.5)
Eosinophils Relative: 1 %
HCT: 37.4 % — ABNORMAL LOW (ref 39.0–52.0)
Hemoglobin: 12 g/dL — ABNORMAL LOW (ref 13.0–17.0)
Immature Granulocytes: 0 %
Lymphocytes Relative: 19 %
Lymphs Abs: 0.9 10*3/uL (ref 0.7–4.0)
MCH: 27.8 pg (ref 26.0–34.0)
MCHC: 32.1 g/dL (ref 30.0–36.0)
MCV: 86.8 fL (ref 80.0–100.0)
Monocytes Absolute: 0.4 10*3/uL (ref 0.1–1.0)
Monocytes Relative: 9 %
Neutro Abs: 3.4 10*3/uL (ref 1.7–7.7)
Neutrophils Relative %: 71 %
Platelets: 150 10*3/uL (ref 150–400)
RBC: 4.31 MIL/uL (ref 4.22–5.81)
RDW: 14 % (ref 11.5–15.5)
WBC: 4.8 10*3/uL (ref 4.0–10.5)
nRBC: 0 % (ref 0.0–0.2)

## 2019-09-30 LAB — CMP (CANCER CENTER ONLY)
ALT: 26 U/L (ref 0–44)
AST: 22 U/L (ref 15–41)
Albumin: 3.8 g/dL (ref 3.5–5.0)
Alkaline Phosphatase: 76 U/L (ref 38–126)
Anion gap: 9 (ref 5–15)
BUN: 15 mg/dL (ref 8–23)
CO2: 25 mmol/L (ref 22–32)
Calcium: 8.9 mg/dL (ref 8.9–10.3)
Chloride: 106 mmol/L (ref 98–111)
Creatinine: 0.96 mg/dL (ref 0.61–1.24)
GFR, Est AFR Am: >60 mL/min (ref >60)
GFR, Estimated: >60 mL/min (ref >60)
Glucose, Bld: 131 mg/dL — ABNORMAL HIGH (ref 70–99)
Potassium: 4.3 mmol/L (ref 3.5–5.1)
Sodium: 140 mmol/L (ref 135–145)
Total Bilirubin: 0.5 mg/dL (ref 0.3–1.2)
Total Protein: 7.2 g/dL (ref 6.5–8.1)

## 2019-09-30 LAB — CEA (IN HOUSE-CHCC): CEA (CHCC-In House): 2.34 ng/mL (ref 0.00–5.00)

## 2019-09-30 MED ORDER — IOHEXOL 9 MG/ML PO SOLN
ORAL | Status: AC
  Filled 2019-09-30: qty 1000

## 2019-09-30 MED ORDER — SODIUM CHLORIDE (PF) 0.9 % IJ SOLN
INTRAMUSCULAR | Status: AC
  Filled 2019-09-30: qty 50

## 2019-09-30 MED ORDER — IOHEXOL 9 MG/ML PO SOLN
1000.0000 mL | ORAL | Status: AC
  Administered 2019-09-30: 500 mL via ORAL

## 2019-09-30 MED ORDER — SODIUM CHLORIDE 0.9% FLUSH
10.0000 mL | INTRAVENOUS | Status: DC | PRN
  Administered 2019-09-30: 10 mL via INTRAVENOUS
  Filled 2019-09-30: qty 10

## 2019-09-30 MED ORDER — IOHEXOL 9 MG/ML PO SOLN
1000.0000 mL | ORAL | Status: DC
  Administered 2019-09-30: 1000 mL via ORAL

## 2019-09-30 MED ORDER — IOHEXOL 300 MG/ML  SOLN
100.0000 mL | Freq: Once | INTRAMUSCULAR | Status: AC | PRN
  Administered 2019-09-30: 100 mL via INTRAVENOUS

## 2019-09-30 MED ORDER — HEPARIN SOD (PORK) LOCK FLUSH 100 UNIT/ML IV SOLN
INTRAVENOUS | Status: AC
  Filled 2019-09-30: qty 5

## 2019-09-30 MED ORDER — HEPARIN SOD (PORK) LOCK FLUSH 100 UNIT/ML IV SOLN
500.0000 [IU] | Freq: Once | INTRAVENOUS | Status: AC
  Administered 2019-09-30: 500 [IU] via INTRAVENOUS

## 2019-10-02 ENCOUNTER — Telehealth: Payer: Self-pay | Admitting: Hematology

## 2019-10-02 NOTE — Telephone Encounter (Signed)
Sedan PAL 11/23 f/u moved to 11/30. Confirmed with patient.

## 2019-10-07 ENCOUNTER — Ambulatory Visit: Payer: 59 | Admitting: Hematology

## 2019-10-14 ENCOUNTER — Inpatient Hospital Stay (HOSPITAL_BASED_OUTPATIENT_CLINIC_OR_DEPARTMENT_OTHER): Payer: 59 | Admitting: Hematology

## 2019-10-14 DIAGNOSIS — C169 Malignant neoplasm of stomach, unspecified: Secondary | ICD-10-CM | POA: Diagnosis present

## 2019-10-14 NOTE — Progress Notes (Signed)
HEMATOLOGY/ONCOLOGY CLINIC NOTE  Date of Service:  10/14/19     Patient Care Team: [name], MD as PCP - General (Internal Medicine) [name] (Surgery)    CHIEF COMPLAINTS/PURPOSE OF CONSULTATION:   F/u for continued management of gastric adenocarcinoma  HISTORY OF PRESENTING ILLNESS:   [name] is a wonderful 68 y.o. male is here for continued evaluation and management of recently diagnosed pT4a, pN3b, pM1 Gastric Adenocarcinoma.  Patient was previously seen in the hospital on 01/19/2017 with a diagnosis of gastric adenocarcinoma in the gastric pylorus causing significant intrinsic stenosis . Patient had presented to primary care physician with dyspeptic symptoms, partial gastric outlet obstruction and weight loss of 60 pounds. Patient has had a distal gastrectomy, cholecystectomy with jejunal feeding tube placement by Dr. Barry Dienes on 01/10/2017 for gastric obstruction secondary to adenocarcinoma of the gastric pylorus. He was noted to have invaded into the pancreas and a bit of the anterior pancreatic head was resected en bloc with the tumor.  Pathology showed  " INVASIVE MODERATELY TO POORLY DIFFERENTIATED ADENOCARCINOMA, SPANNING 4 CM IN GREATEST DIMENSION. TUMOR INVADES THROUGH STOMACH WALL TO INVOLVE SEROSAL SURFACE.  LYMPH/VASCULAR INVASION IS IDENTIFIED.  MARGINS ARE NEGATIVE.- FIFTEEN OF THIRTY NINE LYMPH NODES POSITIVE FOR METASTATIC ADENOCARCINOMA (15/39). EXTRACAPSULAR EXTENSION IS IDENTIFIED. 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  Patient had a prolonged hospitalization post surgery  due to significant NG tube output. Needed epidural and PCA for pain control. It took a while for him to be able to tolerate progression of 2 feeding. He had an abdominal drain due to a small collection near his pancreatic head. Was treated with Augmentin for this. Patient was in the  hospital from 01/10/2017 and was eventually discharged on 01/27/2017.  Patient postponed several attempts at posthospitalization follow-up. He has been recovering at home and has been optimizing his tube feeding. Patient was recently followed up by Dr. Barry Dienes and has had a port placement on 02/23/2017 and preparation for possible chemotherapy.  He notes that his tube feeding is up to 40 mL per hour for 12 hours and has targeted is 16 ml per hour. He was recently admitted to [facility] in [place] for a C. difficile colitis that caused a severe diarrhea. he is currently on oral vancomycin has completed about 5 days of treatment and is also on probiotics.  CURRENT THERAPY: Active surveillance  PREVIOUS THERAPY:  Concurrent Chemoradiation with Xeloda. He started 07/18/17 and completed 08/25/17  INTERVAL HISTORY   I connected with  [name] on 10/14/19 by telephone and verified that I am speaking with the correct person using two identifiers.   I discussed the limitations of evaluation and management by telemedicine. The patient expressed understanding and agreed to proceed.  Other persons participating in the visit and their role in the encounter:     -Yevette Edwards, Medical Scribe  Patient's location: Home Provider's location: [facility] is here for f/u for his gastric adenocarcinoma. The patient's last visit with [name] was on 06/10/2019. The pt reports that he is doing well overall.  The pt reports that he has been well and had a good holiday. Pt has been eating better foods, staying active and losing weight. He has continued to keep safe in light of the current pandemic. He denies any new symptoms and is ready to schedule his routine Colonoscopy. Pt had shingles about 10 years ago  and is interested in a shingles vaccine. Pt received quadrivalent flu shot in September.   Of note since the patient's last visit, pt has had CT C/A/P  [phone]) completed on 09/30/2019 with results revealing "1. Status post distal gastrectomy with gastrojejunostomy. No specific findings identified to suggest local tumor recurrence or metastatic disease. 2. Aortic atherosclerosis. Coronary artery calcifications noted. Aortic Atherosclerosis (ICD10-I70.0)."  Lab results today (09/30/19) of CBC w/diff and CMP is as follows: all values are WNL except for Hgb at 12.0, HCT at 37.4, Glucose at 131. 09/30/2019 CEA at 2.34  On review of systems, pt denies unexpected weight loss, infection issues and any other symptoms.   MEDICAL HISTORY:  Past Medical History:  Diagnosis Date   Anemia    Cancer ([place]) dx'd 11/2016   Gastric Adenocarcinoma   Gastric outlet obstruction 01/2017   GERD (gastroesophageal reflux disease)    Pre-diabetes    denies   SVT (supraventricular tachycardia) ([place])    during Admission and Surgery- 01/2017    SURGICAL HISTORY: Past Surgical History:  Procedure Laterality Date   CHOLECYSTECTOMY N/A 01/10/2017   Procedure: CHOLECYSTECTOMY;  Surgeon: Stark Klein, MD;  Location: [place];  Service: General;  Laterality: N/A;   COLONOSCOPY     COLONOSCOPY  10/11/2011   Procedure: COLONOSCOPY;  Surgeon: Daneil Dolin, MD;  [facility];  Service: Endoscopy;  Laterality: N/A;  10:30 AM   ESOPHAGOGASTRODUODENOSCOPY N/A 12/30/2016   Procedure: ESOPHAGOGASTRODUODENOSCOPY (EGD);  Surgeon: Carol Ada, MD;  Location: [facility];  Service: Endoscopy;  Laterality: N/A;   GASTRECTOMY N/A 01/10/2017   Procedure: DISTAL GASTRECTOMY, JEJUNUM  FEEDING TUBE PLACEMENT;  Surgeon: Stark Klein, MD;  Location: [place];  Service: General;  Laterality: N/A;   IR GENERIC HISTORICAL  01/24/2017   IR [place] DUODEN/JEJUNO TUBE PERCUT W/FLUORO 01/24/2017 Aletta Edouard, MD [facility]-INTERV RAD   IR [facility] DUODEN/JEJUNO TUBE PERCUT W/FLUORO  03/26/2017   PORTACATH PLACEMENT N/A 02/23/2017   Procedure: INSERTION PORT-A-CATH;   Surgeon: Stark Klein, MD;  Location: [place];  Service: General;  Laterality: N/A;   testicle removed  1982    SOCIAL HISTORY: Social History   Socioeconomic History   Marital status: Married    Spouse name: Not on file   Number of children: Not on file   Years of education: Not on file   Highest education level: Not on file  Occupational History   Not on file  Social Needs   Financial resource strain: Not on file   Food insecurity    Worry: Not on file    Inability: Not on file   Transportation needs    Medical: Not on file    Non-medical: Not on file  Tobacco Use   Smoking status: Never Smoker   Smokeless tobacco: Never Used  Substance and Sexual Activity   Alcohol use: No    Comment: occ beer or wine but none in last 6 months    Drug use: No   Sexual activity: Not on file  Lifestyle   Physical activity    Days per week: Not on file    Minutes per session: Not on file   Stress: Not on file  Relationships   Social connections    Talks on phone: Not on file    Gets together: Not on file    Attends religious service: Not on file    Active member of club or organization: Not on file    Attends meetings of clubs or organizations: Not  on file    Relationship status: Not on file   Intimate partner violence    Fear of current or ex partner: Not on file    Emotionally abused: Not on file    Physically abused: Not on file    Forced sexual activity: Not on file  Other Topics Concern   Not on file  Social History Narrative   Not on file    FAMILY HISTORY: Family History  Problem Relation Age of Onset   Prostate cancer Father    Colon cancer Neg Hx     ALLERGIES:  is allergic to no known allergies.  MEDICATIONS:  Current Outpatient Medications  Medication Sig Dispense Refill   ferrous sulfate 325 (65 FE) MG tablet Take 325 mg by mouth daily with breakfast.     lidocaine-prilocaine (EMLA) cream Apply to affected area once (Patient not  taking: Reported on 06/12/2019) 30 g 3   metFORMIN (GLUCOPHAGE) 500 MG tablet Take 500 mg by mouth daily.     Multiple Vitamin (MULTIVITAMIN) tablet Take 1 tablet by mouth daily.     omeprazole (PRILOSEC) 20 MG capsule Take 1 capsule (20 mg total) by mouth 2 (two) times daily before a meal. (Patient not taking: Reported on 04/19/2018) 60 capsule 2   ondansetron (ZOFRAN) 8 MG tablet Take 1 tablet (8 mg total) by mouth every 8 (eight) hours as needed for refractory nausea / vomiting. Start on day 3 after chemotherapy. (Patient not taking: Reported on 04/19/2018) 30 tablet 1   prochlorperazine (COMPAZINE) 10 MG tablet Take 1 tablet (10 mg total) by mouth every 6 (six) hours as needed (Nausea or vomiting). (Patient not taking: Reported on 04/19/2018) 30 tablet 1   sucralfate (CARAFATE) 1 GM/10ML suspension Take 10 mLs (1 g total) by mouth 4 (four) times daily -  with meals and at bedtime. (Patient not taking: Reported on 04/19/2018) 420 mL 1   traZODone (DESYREL) 50 MG tablet TAKE 1 TABLET (50 MG TOTAL) BY MOUTH AT BEDTIME AS NEEDED FOR SLEEP. (Patient not taking: Reported on 04/19/2018) 30 tablet 0   No current facility-administered medications for this visit.     REVIEW OF SYSTEMS:   A 10+ POINT REVIEW OF SYSTEMS WAS OBTAINED including neurology, dermatology, psychiatry, cardiac, respiratory, lymph, extremities, GI, GU, Musculoskeletal, constitutional, breasts, reproductive, HEENT.  All pertinent positives are noted in the HPI.  All others are negative.   PHYSICAL EXAMINATION:   There were no vitals filed for this visit. There were no vitals filed for this visit. There is no height or weight on file to calculate BMI.   Telehealth visit   LABORATORY DATA:  I have reviewed the data as listed  . CBC Latest Ref Rng & Units 09/30/2019 06/10/2019 03/11/2019  WBC 4.0 - 10.5 K/uL 4.8 4.7 4.7  Hemoglobin 13.0 - 17.0 g/dL 12.0(L) 11.9(L) 11.5(L)  Hematocrit 39.0 - 52.0 % 37.4(L) 36.6(L) 36.4(L)    Platelets 150 - 400 K/uL 150 121(L) 135(L)    . CMP Latest Ref Rng & Units 09/30/2019 06/10/2019 03/11/2019  Glucose 70 - 99 mg/dL 131(H) 125(H) 140(H)  BUN 8 - 23 mg/dL 15 13 13   Creatinine 0.61 - 1.24 mg/dL 0.96 1.06 0.98  Sodium 135 - 145 mmol/L 140 141 139  Potassium 3.5 - 5.1 mmol/L 4.3 3.9 4.3  Chloride 98 - 111 mmol/L 106 109 105  CO2 22 - 32 mmol/L 25 24 24   Calcium 8.9 - 10.3 mg/dL 8.9 9.0 8.5(L)  Total Protein 6.5 - 8.1  g/dL 7.2 7.2 6.9  Total Bilirubin 0.3 - 1.2 mg/dL 0.5 0.5 0.4  Alkaline Phos 38 - 126 U/L 76 78 85  AST 15 - 41 U/L 22 18 22   ALT 0 - 44 U/L 26 17 17       RADIOGRAPHIC STUDIES: I have personally reviewed the radiological images as listed and agreed with the findings in the report. Ct Chest W Contrast  Result Date: 09/30/2019 CLINICAL DATA:  Restaging gastric adenocarcinoma. EXAM: CT CHEST, ABDOMEN, AND PELVIS WITH CONTRAST TECHNIQUE: Multidetector CT imaging of the chest, abdomen and pelvis was performed following the standard protocol during bolus administration of intravenous contrast. CONTRAST:  177mL OMNIPAQUE IOHEXOL 300 MG/ML  SOLN COMPARISON:  CT [facility] 03/11/2019 and CT chest 07/13/2018 FINDINGS: CT CHEST FINDINGS Cardiovascular: The heart size is normal. Lad coronary artery calcification. Mediastinum/Nodes: No enlarged mediastinal, hilar, or axillary lymph nodes. Thyroid gland, trachea, and esophagus demonstrate no significant findings. Lungs/Pleura: No pleural effusion identified. No airspace consolidation, atelectasis, or pneumothorax. No suspicious nodule. Musculoskeletal: No chest wall mass or suspicious bone lesions identified. CT ABDOMEN PELVIS FINDINGS Hepatobiliary: No focal liver abnormality is seen. Status post cholecystectomy. No biliary dilatation. Pancreas: Unremarkable. No pancreatic ductal dilatation or surrounding inflammatory changes. Spleen: Normal in size without focal abnormality. Adrenals/Urinary Tract: The adrenal glands are normal. The  kidneys are unremarkable. Urinary bladder appears within normal limits. Stomach/Bowel: The patient is status post distal gastrectomy with gastrojejunostomy. There are no specific, suspicious findings to suggest local tumor recurrence. There is mild, nonspecific wall thickening involving the proximal jejunum just distal to the anastomosis, image 63/2 and image 79/2. The mid and distal small bowel loops are unremarkable. No dilated loops of bowel identified. Moderate stool burden noted throughout the colon. Vascular/Lymphatic: Aortic atherosclerosis. No aneurysm. Aortic atherosclerosis. No aneurysm. No abdominopelvic adenopathy identified. Reproductive: Prostate is unremarkable. Other: No free fluid or fluid collections. Musculoskeletal: No acute or significant osseous findings. IMPRESSION: 1. Status post distal gastrectomy with gastrojejunostomy. No specific findings identified to suggest local tumor recurrence or metastatic disease. 2. Aortic atherosclerosis. Coronary artery calcifications noted. Aortic Atherosclerosis (ICD10-I70.0). Electronically Signed   By: Kerby Moors M.D.   On: 09/30/2019 14:41   Ct Abdomen Pelvis W Contrast  Result Date: 09/30/2019 CLINICAL DATA:  Restaging gastric adenocarcinoma. EXAM: CT CHEST, ABDOMEN, AND PELVIS WITH CONTRAST TECHNIQUE: Multidetector CT imaging of the chest, abdomen and pelvis was performed following the standard protocol during bolus administration of intravenous contrast. CONTRAST:  198mL OMNIPAQUE IOHEXOL 300 MG/ML  SOLN COMPARISON:  CT AP 03/11/2019 and CT chest 07/13/2018 FINDINGS: CT CHEST FINDINGS Cardiovascular: The heart size is normal. Lad coronary artery calcification. Mediastinum/Nodes: No enlarged mediastinal, hilar, or axillary lymph nodes. Thyroid gland, trachea, and esophagus demonstrate no significant findings. Lungs/Pleura: No pleural effusion identified. No airspace consolidation, atelectasis, or pneumothorax. No suspicious nodule.  Musculoskeletal: No chest wall mass or suspicious bone lesions identified. CT ABDOMEN PELVIS FINDINGS Hepatobiliary: No focal liver abnormality is seen. Status post cholecystectomy. No biliary dilatation. Pancreas: Unremarkable. No pancreatic ductal dilatation or surrounding inflammatory changes. Spleen: Normal in size without focal abnormality. Adrenals/Urinary Tract: The adrenal glands are normal. The kidneys are unremarkable. Urinary bladder appears within normal limits. Stomach/Bowel: The patient is status post distal gastrectomy with gastrojejunostomy. There are no specific, suspicious findings to suggest local tumor recurrence. There is mild, nonspecific wall thickening involving the proximal jejunum just distal to the anastomosis, image 63/2 and image 79/2. The mid and distal small bowel loops are unremarkable. No dilated loops  of bowel identified. Moderate stool burden noted throughout the colon. Vascular/Lymphatic: Aortic atherosclerosis. No aneurysm. Aortic atherosclerosis. No aneurysm. No abdominopelvic adenopathy identified. Reproductive: Prostate is unremarkable. Other: No free fluid or fluid collections. Musculoskeletal: No acute or significant osseous findings. IMPRESSION: 1. Status post distal gastrectomy with gastrojejunostomy. No specific findings identified to suggest local tumor recurrence or metastatic disease. 2. Aortic atherosclerosis. Coronary artery calcifications noted. Aortic Atherosclerosis (ICD10-I70.0). Electronically Signed   By: Kerby Moors M.D.   On: 09/30/2019 14:41    ASSESSMENT & PLAN:   68 y.o.  African-American male with  1) Resected Stage IV pT4a, pN3b, pM1 invasive moderately-poorly Gastric Adenocarcinoma . Her 2 neg by FISH LVI + margins neg 15/39 LN +ve, Extracapsular invasion +, some involvement of pancreatic capsule there pM1 Presented with severe pyloric stenosis with ulceration. S/p 60 pound weight loss- now resolved. S/p distal gastrectomy,  cholecystectomy and en bloc dissection off the small part of the pancreatic head  with jejunal feeding tube placement. Initial CT abdomen and pelvis did not show any local regional lymphadenopathy. Rpt CT chest/abd/pelvis from 03/22/2017 - no overt evidence of residual disease or metastatic disease. Post-surgical peripancreatic fluid collection resolving. CT chest/abd/pelvis 06/21/2017 after 6 cycles of FOLFOX -  No evidence of residual or metastatic carcinoma within the chest, abdomen, or pelvis -Started Chemoradiation on 07/18/17 with Xeloda (825mg /m2 twice daily Monday through Friday while on radiation) and completed treatment on 08/24/2017. CT chest/abd/pelvis (11/16/2017) - show radiation related changes but no evidence of tumor progression at this time.  07/13/18 CT C/A/P revealed No evidence of metastatic disease in the chest, abdomen or pelvis. Previously described mildly enlarged high left para-aortic retroperitoneal node is decreased and now normal in size. Previously described peripancreatic fat stranding is decreased and probably treatment related.   03/11/19 CT A/P revealed "Status post distal gastrectomy, without findings to suggest local recurrence of disease or definite metastatic disease in the abdomen or pelvis. 2. Colonic diverticulosis without evidence of acute diverticulitis at this time. 3. Incidental findings, as above, similar to prior studies."  09/30/2019 CT C/A/P [phone]) revealed "1. Status post distal gastrectomy with gastrojejunostomy. No specific findings identified to suggest local tumor recurrence or metastatic disease. 2. Aortic atherosclerosis. Coronary artery calcifications noted. Aortic Atherosclerosis (ICD10-I70.0)."  #2 s/p moderate protein calorie malnutrition patient had lost about 60 pounds in the last 2-3 months but has been gaining back his lost weight well. . Wt Readings from Last 3 Encounters:  06/10/19 298 lb 12.8 oz (135.5 kg)  03/18/19 294 lb  1.6 oz (133.4 kg)  11/19/18 291 lb 3.2 oz (132.1 kg)   #3 status post port placement on 02/23/2017 by dr. Barry Dienes   #4 h/o Clostridium difficile colitis -. No overt issues with diarrhea currently.   #5 suspected sleep apnea.Has not had a sleep study.Wife notes significant snoring which is important with weight loss. Will have to be careful with excessive sedation. -continue prn low dose trazodone for insomnia -recommended he f/u with his PCP for consideration of sleep study-pending   PLAN: -Discussed pt labwork today, 09/30/19; blood counts and chemistries are stable, PLTs have improved.  -Discussed 09/30/2019 CEA levels are stable at 2.34 -Discussed 09/30/2019 CT C/A/P [phone]) which revealed nothing that shows gastric adenocarcinoma recurrance  -Recommended that the pt continue to diet and exercise to maintain ideal body weight and drink at least 48-64 oz of water each day -Advised pt that he can get Shingrix with his PCP, as he his far enough out from  treatment -Pt received his Pneumovax and Prevnar vaccines in 01/2017 and 01/2018  -Advised pt that is not wise to donate blood at this time due to his cancer Hx and previous anemia  -Patient will discuss port removal recommendations with surgeon Dr. Barry Dienes. Okay from my perspective to remove port -Will set up port flush in 2 months -The pt shows no clinical or lab progression/return of his gastric adenocarcinoma at this time.  -No indication for further treatment at this time. -Will continue surveillance with CT Scan every other visit and f/u every 3-4 months for the first 3 years. -Pt advised to contact if any changes in symptomology  -Will see the pt back in 2 months    FOLLOW UP: -Port flush in 8 weeks -RTC with Dr Irene Limbo with labs in 8 weeks   The total time spent in the appt was 20 minutes and more than 50% was on counseling and direct patient cares.  All of the patient's questions were answered with apparent  satisfaction. The patient knows to call the clinic with any problems, questions or concerns.   Sullivan Lone MD [place] AAHIVMS [facility] Hematology/Oncology Physician [facility]  (Office):       [phone] (Work cell):  [phone] (Fax):           [phone]  I, Yevette Edwards, am acting as a scribe for Dr. Sullivan Lone.   .I have reviewed the above documentation for accuracy and completeness, and I agree with the above. Brunetta Genera MD

## 2019-10-15 ENCOUNTER — Telehealth: Payer: Self-pay | Admitting: Hematology

## 2019-10-15 NOTE — Telephone Encounter (Signed)
Scheduled appt per 11/30 los.  Spoke with pt and they are aware of the appt date and time.

## 2019-11-21 DIAGNOSIS — E1165 Type 2 diabetes mellitus with hyperglycemia: Secondary | ICD-10-CM | POA: Diagnosis not present

## 2019-11-21 DIAGNOSIS — D638 Anemia in other chronic diseases classified elsewhere: Secondary | ICD-10-CM | POA: Diagnosis not present

## 2019-11-21 DIAGNOSIS — K219 Gastro-esophageal reflux disease without esophagitis: Secondary | ICD-10-CM | POA: Diagnosis not present

## 2019-11-21 DIAGNOSIS — R1013 Epigastric pain: Secondary | ICD-10-CM | POA: Diagnosis not present

## 2019-11-21 DIAGNOSIS — E782 Mixed hyperlipidemia: Secondary | ICD-10-CM | POA: Diagnosis not present

## 2019-12-09 ENCOUNTER — Inpatient Hospital Stay: Payer: Medicare Other

## 2019-12-09 ENCOUNTER — Inpatient Hospital Stay (HOSPITAL_BASED_OUTPATIENT_CLINIC_OR_DEPARTMENT_OTHER): Payer: Medicare Other | Admitting: Hematology

## 2019-12-09 ENCOUNTER — Other Ambulatory Visit: Payer: Self-pay

## 2019-12-09 ENCOUNTER — Inpatient Hospital Stay: Payer: Medicare Other | Attending: Hematology

## 2019-12-09 VITALS — BP 145/73 | HR 57 | Temp 98.0°F | Resp 18 | Ht 71.0 in | Wt 307.3 lb

## 2019-12-09 DIAGNOSIS — E440 Moderate protein-calorie malnutrition: Secondary | ICD-10-CM | POA: Diagnosis not present

## 2019-12-09 DIAGNOSIS — C169 Malignant neoplasm of stomach, unspecified: Secondary | ICD-10-CM | POA: Diagnosis not present

## 2019-12-09 DIAGNOSIS — Z85028 Personal history of other malignant neoplasm of stomach: Secondary | ICD-10-CM | POA: Diagnosis not present

## 2019-12-09 DIAGNOSIS — Z7189 Other specified counseling: Secondary | ICD-10-CM

## 2019-12-09 DIAGNOSIS — Z452 Encounter for adjustment and management of vascular access device: Secondary | ICD-10-CM | POA: Diagnosis not present

## 2019-12-09 DIAGNOSIS — Z95828 Presence of other vascular implants and grafts: Secondary | ICD-10-CM

## 2019-12-09 DIAGNOSIS — C160 Malignant neoplasm of cardia: Secondary | ICD-10-CM

## 2019-12-09 LAB — CBC WITH DIFFERENTIAL/PLATELET
Abs Immature Granulocytes: 0.01 10*3/uL (ref 0.00–0.07)
Basophils Absolute: 0 10*3/uL (ref 0.0–0.1)
Basophils Relative: 1 %
Eosinophils Absolute: 0.1 10*3/uL (ref 0.0–0.5)
Eosinophils Relative: 1 %
HCT: 37.4 % — ABNORMAL LOW (ref 39.0–52.0)
Hemoglobin: 12 g/dL — ABNORMAL LOW (ref 13.0–17.0)
Immature Granulocytes: 0 %
Lymphocytes Relative: 23 %
Lymphs Abs: 1.1 10*3/uL (ref 0.7–4.0)
MCH: 27.7 pg (ref 26.0–34.0)
MCHC: 32.1 g/dL (ref 30.0–36.0)
MCV: 86.4 fL (ref 80.0–100.0)
Monocytes Absolute: 0.4 10*3/uL (ref 0.1–1.0)
Monocytes Relative: 9 %
Neutro Abs: 3.1 10*3/uL (ref 1.7–7.7)
Neutrophils Relative %: 66 %
Platelets: 158 10*3/uL (ref 150–400)
RBC: 4.33 MIL/uL (ref 4.22–5.81)
RDW: 14.1 % (ref 11.5–15.5)
WBC: 4.6 10*3/uL (ref 4.0–10.5)
nRBC: 0 % (ref 0.0–0.2)

## 2019-12-09 LAB — CMP (CANCER CENTER ONLY)
ALT: 27 U/L (ref 0–44)
AST: 24 U/L (ref 15–41)
Albumin: 3.8 g/dL (ref 3.5–5.0)
Alkaline Phosphatase: 73 U/L (ref 38–126)
Anion gap: 8 (ref 5–15)
BUN: 14 mg/dL (ref 8–23)
CO2: 24 mmol/L (ref 22–32)
Calcium: 8.5 mg/dL — ABNORMAL LOW (ref 8.9–10.3)
Chloride: 107 mmol/L (ref 98–111)
Creatinine: 0.98 mg/dL (ref 0.61–1.24)
GFR, Est AFR Am: >60 mL/min (ref >60)
GFR, Estimated: >60 mL/min (ref >60)
Glucose, Bld: 160 mg/dL — ABNORMAL HIGH (ref 70–99)
Potassium: 4.1 mmol/L (ref 3.5–5.1)
Sodium: 139 mmol/L (ref 135–145)
Total Bilirubin: 0.5 mg/dL (ref 0.3–1.2)
Total Protein: 7.2 g/dL (ref 6.5–8.1)

## 2019-12-09 LAB — VITAMIN B12: Vitamin B-12: 552 pg/mL (ref 180–914)

## 2019-12-09 MED ORDER — HEPARIN SOD (PORK) LOCK FLUSH 100 UNIT/ML IV SOLN
500.0000 [IU] | Freq: Once | INTRAVENOUS | Status: AC | PRN
  Administered 2019-12-09: 500 [IU] via INTRAVENOUS
  Filled 2019-12-09: qty 5

## 2019-12-09 MED ORDER — SODIUM CHLORIDE 0.9% FLUSH
10.0000 mL | INTRAVENOUS | Status: DC | PRN
  Administered 2019-12-09: 10 mL via INTRAVENOUS
  Filled 2019-12-09: qty 10

## 2019-12-09 NOTE — Progress Notes (Signed)
HEMATOLOGY/ONCOLOGY CLINIC NOTE  Date of Service:  12/09/19     Patient Care Team: [name], MD as PCP - General (Internal Medicine) [name] (Surgery)    CHIEF COMPLAINTS/PURPOSE OF CONSULTATION:   F/u for continued management of gastric adenocarcinoma  HISTORY OF PRESENTING ILLNESS:   [name] is a wonderful 69 y.o. male is here for continued evaluation and management of recently diagnosed pT4a, pN3b, pM1 Gastric Adenocarcinoma.  Patient was previously seen in the hospital on 01/19/2017 with a diagnosis of gastric adenocarcinoma in the gastric pylorus causing significant intrinsic stenosis . Patient had presented to primary care physician with dyspeptic symptoms, partial gastric outlet obstruction and weight loss of 60 pounds. Patient has had a distal gastrectomy, cholecystectomy with jejunal feeding tube placement by Dr. Barry Dienes on 01/10/2017 for gastric obstruction secondary to adenocarcinoma of the gastric pylorus. He was noted to have invaded into the pancreas and a bit of the anterior pancreatic head was resected en bloc with the tumor.  Pathology showed  " INVASIVE MODERATELY TO POORLY DIFFERENTIATED ADENOCARCINOMA, SPANNING 4 CM IN GREATEST DIMENSION. TUMOR INVADES THROUGH STOMACH WALL TO INVOLVE SEROSAL SURFACE.  LYMPH/VASCULAR INVASION IS IDENTIFIED.  MARGINS ARE NEGATIVE.- FIFTEEN OF THIRTY NINE LYMPH NODES POSITIVE FOR METASTATIC ADENOCARCINOMA (15/39). EXTRACAPSULAR EXTENSION IS IDENTIFIED. 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  Patient had a prolonged hospitalization post surgery  due to significant NG tube output. Needed epidural and PCA for pain control. It took a while for him to be able to tolerate progression of 2 feeding. He had an abdominal drain due to a small collection near his pancreatic head. Was treated with Augmentin for this. Patient was in the  hospital from 01/10/2017 and was eventually discharged on 01/27/2017.  Patient postponed several attempts at posthospitalization follow-up. He has been recovering at home and has been optimizing his tube feeding. Patient was recently followed up by Dr. Barry Dienes and has had a port placement on 02/23/2017 and preparation for possible chemotherapy.  He notes that his tube feeding is up to 40 mL per hour for 12 hours and has targeted is 16 ml per hour. He was recently admitted to [facility] in [place] for a C. difficile colitis that caused a severe diarrhea. he is currently on oral vancomycin has completed about 5 days of treatment and is also on probiotics.  CURRENT THERAPY: Active surveillance  PREVIOUS THERAPY:  Concurrent Chemoradiation with Xeloda. He started 07/18/17 and completed 08/25/17  INTERVAL HISTORY   [name] is here for f/u for his gastric adenocarcinoma. The patient's last visit with [name] was on 10/14/2019. The pt reports that he is doing well overall.  The pt reports that he has been well in the interim. He is scheduled to get the first dose of the COVID19 vaccine tomorrow. Pt notes that he feels cold often, especially while sitting. Pt takes a daily multivitamin. He has remained active by working outside.   Lab results today (12/09/19) of CBC w/diff and CMP is as follows: all values are WNL except for Hgb at 12.0, HCT at 37.4, Glucose at 160, Calcium at 8.5. 12/09/2019 Vitamin B12 at 552 12/09/2019 CEA is 2.34 12/09/2019 Ferritin is in 87  On review of systems, pt reports chills and denies constipation, diarrhea, abdominal pain, leg swelling, testicular pain/swelling and any other symptoms.   MEDICAL HISTORY:  Past Medical History:  Diagnosis Date  . Anemia   . Cancer ([place]) dx'd  11/2016   Gastric Adenocarcinoma  . Gastric outlet obstruction 01/2017  . GERD (gastroesophageal reflux disease)   . Pre-diabetes    denies  . SVT (supraventricular tachycardia) ([place])    during  Admission and Surgery- 01/2017    SURGICAL HISTORY: Past Surgical History:  Procedure Laterality Date  . CHOLECYSTECTOMY N/A 01/10/2017   Procedure: CHOLECYSTECTOMY;  Surgeon: Stark Klein, MD;  Location: [place];  Service: General;  Laterality: N/A;  . COLONOSCOPY    . COLONOSCOPY  10/11/2011   Procedure: COLONOSCOPY;  Surgeon: Daneil Dolin, MD;  [facility];  Service: Endoscopy;  Laterality: N/A;  10:30 AM  . ESOPHAGOGASTRODUODENOSCOPY N/A 12/30/2016   Procedure: ESOPHAGOGASTRODUODENOSCOPY (EGD);  Surgeon: Carol Ada, MD;  Location: [facility];  Service: Endoscopy;  Laterality: N/A;  . GASTRECTOMY N/A 01/10/2017   Procedure: DISTAL GASTRECTOMY, JEJUNUM  FEEDING TUBE PLACEMENT;  Surgeon: Stark Klein, MD;  Location: [place];  Service: General;  Laterality: N/A;  . IR GENERIC HISTORICAL  01/24/2017   IR [place] DUODEN/JEJUNO TUBE PERCUT W/FLUORO 01/24/2017 Aletta Edouard, MD [facility]  . IR REPLC DUODEN/JEJUNO TUBE PERCUT W/FLUORO  03/26/2017  . PORTACATH PLACEMENT N/A 02/23/2017   Procedure: INSERTION PORT-A-CATH;  Surgeon: Stark Klein, MD;  Location: [place];  Service: General;  Laterality: N/A;  . testicle removed  1982    SOCIAL HISTORY: Social History   Socioeconomic History  . Marital status: Married    Spouse name: Not on file  . Number of children: Not on file  . Years of education: Not on file  . Highest education level: Not on file  Occupational History  . Not on file  Tobacco Use  . Smoking status: Never Smoker  . Smokeless tobacco: Never Used  Substance and Sexual Activity  . Alcohol use: No    Comment: occ beer or wine but none in last 6 months   . Drug use: No  . Sexual activity: Not on file  Other Topics Concern  . Not on file  Social History Narrative  . Not on file   Social Determinants of Health   Financial Resource Strain:   . Difficulty of Paying Living Expenses: Not on file  Food Insecurity:   . Worried About Charity fundraiser in the  Last Year: Not on file  . Ran Out of Food in the Last Year: Not on file  Transportation Needs:   . Lack of Transportation (Medical): Not on file  . Lack of Transportation (Non-Medical): Not on file  Physical Activity:   . Days of Exercise per Week: Not on file  . Minutes of Exercise per Session: Not on file  Stress:   . Feeling of Stress : Not on file  Social Connections:   . Frequency of Communication with Friends and Family: Not on file  . Frequency of Social Gatherings with Friends and Family: Not on file  . Attends Religious Services: Not on file  . Active Member of Clubs or Organizations: Not on file  . Attends Archivist Meetings: Not on file  . Marital Status: Not on file  Intimate Partner Violence:   . Fear of Current or Ex-Partner: Not on file  . Emotionally Abused: Not on file  . Physically Abused: Not on file  . Sexually Abused: Not on file    FAMILY HISTORY: Family History  Problem Relation Age of Onset  . Prostate cancer Father   . Colon cancer Neg Hx     ALLERGIES:  is  allergic to no known allergies.  MEDICATIONS:  Current Outpatient Medications  Medication Sig Dispense Refill  . ferrous sulfate 325 (65 FE) MG tablet Take 325 mg by mouth daily with breakfast.    . Multiple Vitamin (MULTIVITAMIN) tablet Take 1 tablet by mouth daily.    [name] Kitchen lidocaine-prilocaine (EMLA) cream Apply to affected area once (Patient not taking: Reported on 06/12/2019) 30 g 3  . omeprazole (PRILOSEC) 20 MG capsule Take 1 capsule (20 mg total) by mouth 2 (two) times daily before a meal. (Patient not taking: Reported on 04/19/2018) 60 capsule 2  . ondansetron (ZOFRAN) 8 MG tablet Take 1 tablet (8 mg total) by mouth every 8 (eight) hours as needed for refractory nausea / vomiting. Start on day 3 after chemotherapy. (Patient not taking: Reported on 04/19/2018) 30 tablet 1   No current facility-administered medications for this visit.    REVIEW OF SYSTEMS:   A 10+ POINT REVIEW OF  SYSTEMS WAS OBTAINED including neurology, dermatology, psychiatry, cardiac, respiratory, lymph, extremities, GI, GU, Musculoskeletal, constitutional, breasts, reproductive, HEENT.  All pertinent positives are noted in the HPI.  All others are negative.   PHYSICAL EXAMINATION:   Vitals:   12/09/19 1506  BP: (!) 145/73  Pulse: (!) 57  Resp: 18  Temp: 98 F (36.7 C)  SpO2: 100%   Filed Weights   12/09/19 1506  Weight: (!) 307 lb 4.8 oz (139.4 kg)   Body mass index is 42.86 kg/m.   GENERAL:alert, in no acute distress and comfortable SKIN: no acute rashes, no significant lesions EYES: conjunctiva are pink and non-injected, sclera anicteric OROPHARYNX: MMM, no exudates, no oropharyngeal erythema or ulceration NECK: supple, no JVD LYMPH:  no palpable lymphadenopathy in the cervical, axillary or inguinal regions LUNGS: clear to auscultation b/l with normal respiratory effort HEART: regular rate & rhythm ABDOMEN:  normoactive bowel sounds , non tender, not distended. No palpable hepatosplenomegaly.  Extremity: no pedal edema PSYCH: alert & oriented x 3 with fluent speech NEURO: no focal motor/sensory deficits  LABORATORY DATA:  I have reviewed the data as listed  . CBC Latest Ref Rng & Units 12/09/2019 09/30/2019 06/10/2019  WBC 4.0 - 10.5 K/uL 4.6 4.8 4.7  Hemoglobin 13.0 - 17.0 g/dL 12.0(L) 12.0(L) 11.9(L)  Hematocrit 39.0 - 52.0 % 37.4(L) 37.4(L) 36.6(L)  Platelets 150 - 400 K/uL 158 150 121(L)    . CMP Latest Ref Rng & Units 12/09/2019 09/30/2019 06/10/2019  Glucose 70 - 99 mg/dL 160(H) 131(H) 125(H)  BUN 8 - 23 mg/dL _0 Creatinine 0.61 - 1.24 mg/dL 0.98 0.96 1.06  Sodium 135 - 145 mmol/L 139 140 141  Potassium 3.5 - 5.1 mmol/L 4.1 4.3 3.9  Chloride 98 - 111 mmol/L 107 106 109  CO2 22 - 32 mmol/L _1 Calcium 8.9 - 10.3 mg/dL 8.5(L) 8.9 9.0  Total Protein 6.5 - 8.1 g/dL 7.2 7.2 7.2  Total Bilirubin 0.3 - 1.2 mg/dL 0.5 0.5 0.5  Alkaline Phos 38 - 126 U/L 73  76 78  AST 15 - 41 U/L _2 ALT 0 - 44 U/L _3 RADIOGRAPHIC STUDIES: I have personally reviewed the radiological images as listed and agreed with the findings in the report. No results found.  ASSESSMENT & PLAN:   69 y.o.  African-American male with  1) Resected Stage IV pT4a, pN3b, pM1 invasive moderately-poorly Gastric Adenocarcinoma . Her 2 neg by FISH LVI + margins neg 15/39  LN +ve, Extracapsular invasion +, some involvement of pancreatic capsule there pM1 Presented with severe pyloric stenosis with ulceration. S/p 60 pound weight loss- now resolved. S/p distal gastrectomy, cholecystectomy and en bloc dissection off the small part of the pancreatic head  with jejunal feeding tube placement. Initial CT abdomen and pelvis did not show any local regional lymphadenopathy. Rpt CT chest/abd/pelvis from 03/22/2017 - no overt evidence of residual disease or metastatic disease. Post-surgical peripancreatic fluid collection resolving. CT chest/abd/pelvis 06/21/2017 after 6 cycles of FOLFOX -  No evidence of residual or metastatic carcinoma within the chest, abdomen, or pelvis -Started Chemoradiation on 07/18/17 with Xeloda (898m/m2 twice daily Monday through Friday while on radiation) and completed treatment on 08/24/2017. CT chest/abd/pelvis (11/16/2017) - show radiation related changes but no evidence of tumor progression at this time.  07/13/18 CT C/A/P revealed No evidence of metastatic disease in the chest, abdomen or pelvis. Previously described mildly enlarged high left para-aortic retroperitoneal node is decreased and now normal in size. Previously described peripancreatic fat stranding is decreased and probably treatment related.   03/11/19 CT A/P revealed "Status post distal gastrectomy, without findings to suggest local recurrence of disease or definite metastatic disease in the abdomen or pelvis. 2. Colonic diverticulosis without evidence of acute diverticulitis at this  time. 3. Incidental findings, as above, similar to prior studies."  09/30/2019 CT C/A/P ([phone])([phone]) revealed "1. Status post distal gastrectomy with gastrojejunostomy. No specific findings identified to suggest local tumor recurrence or metastatic disease. 2. Aortic atherosclerosis. Coronary artery calcifications noted. Aortic Atherosclerosis (ICD10-I70.0)."  #2 s/p moderate protein calorie malnutrition patient had lost about 60 pounds in the last 2-3 months but has been gaining back his lost weight well. . Wt Readings from Last 3 Encounters:  12/09/19 (!) 307 lb 4.8 oz (139.4 kg)  06/10/19 298 lb 12.8 oz (135.5 kg)  03/18/19 294 lb 1.6 oz (133.4 kg)   #3 status post port placement on 02/23/2017 by dr. BBarry Dienes  #4 h/o Clostridium difficile colitis -. No overt issues with diarrhea currently.   #5 suspected sleep apnea.Has not had a sleep study.Wife notes significant snoring which is important with weight loss. Will have to be careful with excessive sedation. -continue prn low dose trazodone for insomnia -recommended he f/u with his PCP for consideration of sleep study-pending   PLAN: -Discussed pt labwork today, 12/09/19; blood counts are stable, blood chemistries are steady -Discussed 12/09/2019 Vitamin B12 at 552 -Discussed 12/09/2019 CEA is wnl at 2.41 -Discussed 12/09/2019 Ferritin is 87 -Advised pt that Vitamin B12 and Iron deficiency is common after gastrectomy due to improper nutrient absorption - will continue to monitor -Pt received his Pneumovax and Prevnar vaccines in 01/2017 and 01/2018   -Recommended pt f/u with COVID19 vaccine appointment -The pt shows no clinical or lab progression/return of his gastric adenocarcinoma at this time. -No indication for further treatment at this time -Will continue surveillance with f/u every 3-4 months for the first 3 years. -Pt advised to contact if any changes in symptomology  -Will see back in 4 months with labs     FOLLOW UP: Portflush in 2 months RTC with Dr KIrene Limbowith portflush and labs in 4 months   The total time spent in the appt was 25 minutes and more than 50% was on counseling and direct patient cares.  All of the patient's questions were answered with apparent satisfaction. The patient knows to call the clinic with any problems, questions or concerns.    GSullivan Lone  MD MS AAHIVMS [facility] Hematology/Oncology Physician [facility]  (Office):       [phone] (Work cell):  [phone] (Fax):           [phone]  I, Yevette Edwards, am acting as a scribe for Dr. Sullivan Lone.   .I have reviewed the above documentation for accuracy and completeness, and I agree with the above. Brunetta Genera MD

## 2019-12-10 ENCOUNTER — Telehealth: Payer: Self-pay | Admitting: Hematology

## 2019-12-10 DIAGNOSIS — Z23 Encounter for immunization: Secondary | ICD-10-CM | POA: Diagnosis not present

## 2019-12-10 LAB — FERRITIN: Ferritin: 87 ng/mL (ref 24–336)

## 2019-12-10 LAB — CEA (IN HOUSE-CHCC): CEA (CHCC-In House): 2.41 ng/mL (ref 0.00–5.00)

## 2019-12-10 NOTE — Telephone Encounter (Signed)
Scheduled per 01/25 los, patient has been called and notified. 

## 2019-12-24 DIAGNOSIS — D638 Anemia in other chronic diseases classified elsewhere: Secondary | ICD-10-CM | POA: Diagnosis not present

## 2019-12-24 DIAGNOSIS — E1165 Type 2 diabetes mellitus with hyperglycemia: Secondary | ICD-10-CM | POA: Diagnosis not present

## 2019-12-24 DIAGNOSIS — R1013 Epigastric pain: Secondary | ICD-10-CM | POA: Diagnosis not present

## 2019-12-24 DIAGNOSIS — K219 Gastro-esophageal reflux disease without esophagitis: Secondary | ICD-10-CM | POA: Diagnosis not present

## 2019-12-24 DIAGNOSIS — Z789 Other specified health status: Secondary | ICD-10-CM | POA: Diagnosis not present

## 2019-12-24 DIAGNOSIS — E782 Mixed hyperlipidemia: Secondary | ICD-10-CM | POA: Diagnosis not present

## 2019-12-29 ENCOUNTER — Ambulatory Visit: Payer: 59

## 2020-01-07 DIAGNOSIS — Z23 Encounter for immunization: Secondary | ICD-10-CM | POA: Diagnosis not present

## 2020-02-06 ENCOUNTER — Inpatient Hospital Stay: Payer: Medicare Other | Attending: Hematology

## 2020-02-06 ENCOUNTER — Other Ambulatory Visit: Payer: Self-pay

## 2020-02-06 DIAGNOSIS — Z95828 Presence of other vascular implants and grafts: Secondary | ICD-10-CM

## 2020-02-06 DIAGNOSIS — Z85028 Personal history of other malignant neoplasm of stomach: Secondary | ICD-10-CM | POA: Diagnosis not present

## 2020-02-06 DIAGNOSIS — C160 Malignant neoplasm of cardia: Secondary | ICD-10-CM

## 2020-02-06 DIAGNOSIS — Z452 Encounter for adjustment and management of vascular access device: Secondary | ICD-10-CM | POA: Diagnosis not present

## 2020-02-06 DIAGNOSIS — Z7189 Other specified counseling: Secondary | ICD-10-CM

## 2020-02-06 MED ORDER — SODIUM CHLORIDE 0.9% FLUSH
10.0000 mL | INTRAVENOUS | Status: DC | PRN
  Administered 2020-02-06: 10 mL via INTRAVENOUS
  Filled 2020-02-06: qty 10

## 2020-02-06 MED ORDER — HEPARIN SOD (PORK) LOCK FLUSH 100 UNIT/ML IV SOLN
500.0000 [IU] | Freq: Once | INTRAVENOUS | Status: AC | PRN
  Administered 2020-02-06: 500 [IU] via INTRAVENOUS
  Filled 2020-02-06: qty 5

## 2020-02-11 DIAGNOSIS — D638 Anemia in other chronic diseases classified elsewhere: Secondary | ICD-10-CM | POA: Diagnosis not present

## 2020-02-11 DIAGNOSIS — K219 Gastro-esophageal reflux disease without esophagitis: Secondary | ICD-10-CM | POA: Diagnosis not present

## 2020-02-11 DIAGNOSIS — R1013 Epigastric pain: Secondary | ICD-10-CM | POA: Diagnosis not present

## 2020-02-11 DIAGNOSIS — E782 Mixed hyperlipidemia: Secondary | ICD-10-CM | POA: Diagnosis not present

## 2020-02-11 DIAGNOSIS — E1165 Type 2 diabetes mellitus with hyperglycemia: Secondary | ICD-10-CM | POA: Diagnosis not present

## 2020-02-11 DIAGNOSIS — Z125 Encounter for screening for malignant neoplasm of prostate: Secondary | ICD-10-CM | POA: Diagnosis not present

## 2020-02-25 DIAGNOSIS — E782 Mixed hyperlipidemia: Secondary | ICD-10-CM | POA: Diagnosis not present

## 2020-02-25 DIAGNOSIS — D638 Anemia in other chronic diseases classified elsewhere: Secondary | ICD-10-CM | POA: Diagnosis not present

## 2020-02-25 DIAGNOSIS — K219 Gastro-esophageal reflux disease without esophagitis: Secondary | ICD-10-CM | POA: Diagnosis not present

## 2020-02-25 DIAGNOSIS — E1165 Type 2 diabetes mellitus with hyperglycemia: Secondary | ICD-10-CM | POA: Diagnosis not present

## 2020-02-25 DIAGNOSIS — R1013 Epigastric pain: Secondary | ICD-10-CM | POA: Diagnosis not present

## 2020-04-06 ENCOUNTER — Other Ambulatory Visit: Payer: Self-pay | Admitting: *Deleted

## 2020-04-06 DIAGNOSIS — C169 Malignant neoplasm of stomach, unspecified: Secondary | ICD-10-CM

## 2020-04-07 ENCOUNTER — Inpatient Hospital Stay: Payer: 59

## 2020-04-07 ENCOUNTER — Inpatient Hospital Stay: Payer: 59 | Admitting: Hematology

## 2020-04-28 ENCOUNTER — Other Ambulatory Visit: Payer: Self-pay

## 2020-04-28 ENCOUNTER — Inpatient Hospital Stay: Payer: Medicare Other

## 2020-04-28 ENCOUNTER — Inpatient Hospital Stay (HOSPITAL_BASED_OUTPATIENT_CLINIC_OR_DEPARTMENT_OTHER): Payer: Medicare Other | Admitting: Hematology

## 2020-04-28 ENCOUNTER — Inpatient Hospital Stay: Payer: Medicare Other | Attending: Hematology

## 2020-04-28 VITALS — BP 139/64 | HR 65 | Temp 98.1°F | Resp 17 | Ht 71.0 in | Wt 310.1 lb

## 2020-04-28 DIAGNOSIS — C169 Malignant neoplasm of stomach, unspecified: Secondary | ICD-10-CM

## 2020-04-28 DIAGNOSIS — Z85028 Personal history of other malignant neoplasm of stomach: Secondary | ICD-10-CM | POA: Diagnosis not present

## 2020-04-28 DIAGNOSIS — Z95828 Presence of other vascular implants and grafts: Secondary | ICD-10-CM | POA: Diagnosis not present

## 2020-04-28 LAB — CBC WITH DIFFERENTIAL (CANCER CENTER ONLY)
Abs Immature Granulocytes: 0.01 10*3/uL (ref 0.00–0.07)
Basophils Absolute: 0 10*3/uL (ref 0.0–0.1)
Basophils Relative: 0 %
Eosinophils Absolute: 0.1 10*3/uL (ref 0.0–0.5)
Eosinophils Relative: 1 %
HCT: 36.9 % — ABNORMAL LOW (ref 39.0–52.0)
Hemoglobin: 12 g/dL — ABNORMAL LOW (ref 13.0–17.0)
Immature Granulocytes: 0 %
Lymphocytes Relative: 18 %
Lymphs Abs: 0.9 10*3/uL (ref 0.7–4.0)
MCH: 28.1 pg (ref 26.0–34.0)
MCHC: 32.5 g/dL (ref 30.0–36.0)
MCV: 86.4 fL (ref 80.0–100.0)
Monocytes Absolute: 0.4 10*3/uL (ref 0.1–1.0)
Monocytes Relative: 8 %
Neutro Abs: 3.5 10*3/uL (ref 1.7–7.7)
Neutrophils Relative %: 73 %
Platelet Count: 144 10*3/uL — ABNORMAL LOW (ref 150–400)
RBC: 4.27 MIL/uL (ref 4.22–5.81)
RDW: 14.2 % (ref 11.5–15.5)
WBC Count: 5 10*3/uL (ref 4.0–10.5)
nRBC: 0 % (ref 0.0–0.2)

## 2020-04-28 LAB — CMP (CANCER CENTER ONLY)
ALT: 23 U/L (ref 0–44)
AST: 17 U/L (ref 15–41)
Albumin: 3.6 g/dL (ref 3.5–5.0)
Alkaline Phosphatase: 73 U/L (ref 38–126)
Anion gap: 10 (ref 5–15)
BUN: 12 mg/dL (ref 8–23)
CO2: 23 mmol/L (ref 22–32)
Calcium: 9 mg/dL (ref 8.9–10.3)
Chloride: 106 mmol/L (ref 98–111)
Creatinine: 1.15 mg/dL (ref 0.61–1.24)
GFR, Est AFR Am: >60 mL/min (ref >60)
GFR, Estimated: >60 mL/min (ref >60)
Glucose, Bld: 224 mg/dL — ABNORMAL HIGH (ref 70–99)
Potassium: 4.3 mmol/L (ref 3.5–5.1)
Sodium: 139 mmol/L (ref 135–145)
Total Bilirubin: 0.5 mg/dL (ref 0.3–1.2)
Total Protein: 7 g/dL (ref 6.5–8.1)

## 2020-04-28 LAB — FERRITIN: Ferritin: 92 ng/mL (ref 24–336)

## 2020-04-28 NOTE — Progress Notes (Signed)
HEMATOLOGY/ONCOLOGY CLINIC NOTE  Date of Service:  04/28/20     Patient Care Team: [name], MD as PCP - General (Internal Medicine) [name] (Surgery)    CHIEF COMPLAINTS/PURPOSE OF CONSULTATION:   F/u for continued management of gastric adenocarcinoma  HISTORY OF PRESENTING ILLNESS:   [name] is a wonderful 69 y.o. male is here for continued evaluation and management of recently diagnosed pT4a, pN3b, pM1 Gastric Adenocarcinoma.  Patient was previously seen in the hospital on 01/19/2017 with a diagnosis of gastric adenocarcinoma in the gastric pylorus causing significant intrinsic stenosis . Patient had presented to primary care physician with dyspeptic symptoms, partial gastric outlet obstruction and weight loss of 60 pounds. Patient has had a distal gastrectomy, cholecystectomy with jejunal feeding tube placement by Dr. Barry Dienes on 01/10/2017 for gastric obstruction secondary to adenocarcinoma of the gastric pylorus. He was noted to have invaded into the pancreas and a bit of the anterior pancreatic head was resected en bloc with the tumor.  Pathology showed  " INVASIVE MODERATELY TO POORLY DIFFERENTIATED ADENOCARCINOMA, SPANNING 4 CM IN GREATEST DIMENSION. TUMOR INVADES THROUGH STOMACH WALL TO INVOLVE SEROSAL SURFACE.  LYMPH/VASCULAR INVASION IS IDENTIFIED.  MARGINS ARE NEGATIVE.- FIFTEEN OF THIRTY NINE LYMPH NODES POSITIVE FOR METASTATIC ADENOCARCINOMA (15/39). EXTRACAPSULAR EXTENSION IS IDENTIFIED. 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  Patient had a prolonged hospitalization post surgery  due to significant NG tube output. Needed epidural and PCA for pain control. It took a while for him to be able to tolerate progression of 2 feeding. He had an abdominal drain due to a small collection near his pancreatic head. Was treated with Augmentin for this. Patient was in the  hospital from 01/10/2017 and was eventually discharged on 01/27/2017.  Patient postponed several attempts at posthospitalization follow-up. He has been recovering at home and has been optimizing his tube feeding. Patient was recently followed up by Dr. Barry Dienes and has had a port placement on 02/23/2017 and preparation for possible chemotherapy.  He notes that his tube feeding is up to 40 mL per hour for 12 hours and has targeted is 16 ml per hour. He was recently admitted to [facility] in [place] for a C. difficile colitis that caused a severe diarrhea. he is currently on oral vancomycin has completed about 5 days of treatment and is also on probiotics.  CURRENT THERAPY: Active surveillance  PREVIOUS THERAPY:  Concurrent Chemoradiation with Xeloda. He started 07/18/17 and completed 08/25/17  INTERVAL HISTORY  [name] is here for f/u for his gastric adenocarcinoma. The patient's last visit with [name] was on 12/09/2019. The pt reports that he is doing well overall.  The pt reports that he has been feeling well, eating well, and staying active. He has gotten both of his COVID19 vaccines. Pt has been visiting his PCP monthly to receive clearance to stay out of work. Pt has previously been on Metformin for Diabetes, but was told to discontinue around the time of his Gastric Adenocarcinoma diagnosis. He has not required any medication to control his blood glucose since.   Lab results today (04/28/20) of CBC w/diff and CMP is as follows: all values are WNL except for Hgb at 12.0, HCT at 36.9, PLT at 144K, Glucose at 224. 04/28/2020 Ferritin is in progress  On review of systems, pt reports numbness in feet and denies abdominal pain, black/bloody stools, constipation, diarrhea, unexpected weight loss, loss of appetite, leg swelling and any  other symptoms.   MEDICAL HISTORY:  Past Medical History:  Diagnosis Date  . Anemia   . Cancer ([place]) dx'd 11/2016   Gastric Adenocarcinoma  . Gastric outlet  obstruction 01/2017  . GERD (gastroesophageal reflux disease)   . Pre-diabetes    denies  . SVT (supraventricular tachycardia) ([place])    during Admission and Surgery- 01/2017    SURGICAL HISTORY: Past Surgical History:  Procedure Laterality Date  . CHOLECYSTECTOMY N/A 01/10/2017   Procedure: CHOLECYSTECTOMY;  Surgeon: Stark Klein, MD;  Location: [facility];  Service: General;  Laterality: N/A;  . COLONOSCOPY    . COLONOSCOPY  10/11/2011   Procedure: COLONOSCOPY;  Surgeon: Daneil Dolin, MD;  [facility];  Service: Endoscopy;  Laterality: N/A;  10:30 AM  . ESOPHAGOGASTRODUODENOSCOPY N/A 12/30/2016   Procedure: ESOPHAGOGASTRODUODENOSCOPY (EGD);  Surgeon: Carol Ada, MD;  Location: [facility];  Service: Endoscopy;  Laterality: N/A;  . GASTRECTOMY N/A 01/10/2017   Procedure: DISTAL GASTRECTOMY, JEJUNUM  FEEDING TUBE PLACEMENT;  Surgeon: Stark Klein, MD;  Location: [facility];  Service: General;  Laterality: N/A;  . IR GENERIC HISTORICAL  01/24/2017   IR [place] DUODEN/JEJUNO TUBE PERCUT W/FLUORO 01/24/2017 Aletta Edouard, MD [facility]  . IR REPLC DUODEN/JEJUNO TUBE PERCUT W/FLUORO  03/26/2017  . PORTACATH PLACEMENT N/A 02/23/2017   Procedure: INSERTION PORT-A-CATH;  Surgeon: Stark Klein, MD;  Location: [facility];  Service: General;  Laterality: N/A;  . testicle removed  1982    SOCIAL HISTORY: Social History   Socioeconomic History  . Marital status: Married    Spouse name: Not on file  . Number of children: Not on file  . Years of education: Not on file  . Highest education level: Not on file  Occupational History  . Not on file  Tobacco Use  . Smoking status: Never Smoker  . Smokeless tobacco: Never Used  Vaping Use  . Vaping Use: Unknown  Substance and Sexual Activity  . Alcohol use: No    Comment: occ beer or wine but none in last 6 months   . Drug use: No  . Sexual activity: Not on file  Other Topics Concern  . Not on file  Social History Narrative  . Not on file     Social Determinants of Health   Financial Resource Strain:   . Difficulty of Paying Living Expenses:   Food Insecurity:   . Worried About Charity fundraiser in the Last Year:   . Arboriculturist in the Last Year:   Transportation Needs:   . Film/video editor (Medical):   [name] Kitchen Lack of Transportation (Non-Medical):   Physical Activity:   . Days of Exercise per Week:   . Minutes of Exercise per Session:   Stress:   . Feeling of Stress :   Social Connections:   . Frequency of Communication with Friends and Family:   . Frequency of Social Gatherings with Friends and Family:   . Attends Religious Services:   . Active Member of Clubs or Organizations:   . Attends Archivist Meetings:   [name] Kitchen Marital Status:   Intimate Partner Violence:   . Fear of Current or Ex-Partner:   . Emotionally Abused:   [name] Kitchen Physically Abused:   . Sexually Abused:     FAMILY HISTORY: Family History  Problem Relation Age of Onset  . Prostate cancer Father   . Colon cancer Neg Hx     ALLERGIES:  is allergic to no known allergies.  MEDICATIONS:  Current Outpatient Medications  Medication Sig Dispense Refill  . ferrous sulfate 325 (65 FE) MG tablet Take 325 mg by mouth daily with breakfast.    . lidocaine-prilocaine (EMLA) cream Apply to affected area once (Patient not taking: Reported on 06/12/2019) 30 g 3  . Multiple Vitamin (MULTIVITAMIN) tablet Take 1 tablet by mouth daily.    [name] Kitchen omeprazole (PRILOSEC) 20 MG capsule Take 1 capsule (20 mg total) by mouth 2 (two) times daily before a meal. (Patient not taking: Reported on 04/19/2018) 60 capsule 2  . ondansetron (ZOFRAN) 8 MG tablet Take 1 tablet (8 mg total) by mouth every 8 (eight) hours as needed for refractory nausea / vomiting. Start on day 3 after chemotherapy. (Patient not taking: Reported on 04/19/2018) 30 tablet 1   No current facility-administered medications for this visit.    REVIEW OF SYSTEMS:   A 10+ POINT REVIEW OF SYSTEMS WAS  OBTAINED including neurology, dermatology, psychiatry, cardiac, respiratory, lymph, extremities, GI, GU, Musculoskeletal, constitutional, breasts, reproductive, HEENT.  All pertinent positives are noted in the HPI.  All others are negative.    PHYSICAL EXAMINATION:   Vitals:   04/28/20 1217  BP: 139/64  Pulse: 65  Resp: 17  Temp: 98.1 F (36.7 C)  SpO2: 100%   Filed Weights   04/28/20 1217  Weight: (!) 310 lb 1.6 oz (140.7 kg)   Body mass index is 43.25 kg/m.   GENERAL:alert, in no acute distress and comfortable SKIN: no acute rashes, no significant lesions EYES: conjunctiva are pink and non-injected, sclera anicteric OROPHARYNX: MMM, no exudates, no oropharyngeal erythema or ulceration NECK: supple, no JVD LYMPH:  no palpable lymphadenopathy in the cervical, axillary or inguinal regions LUNGS: clear to auscultation b/l with normal respiratory effort HEART: regular rate & rhythm ABDOMEN:  normoactive bowel sounds , non tender, not distended. No palpable hepatosplenomegaly.  Extremity: no pedal edema PSYCH: alert & oriented x 3 with fluent speech NEURO: no focal motor/sensory deficits  LABORATORY DATA:  I have reviewed the data as listed  . CBC Latest Ref Rng & Units 04/28/2020 12/09/2019 09/30/2019  WBC 4.0 - 10.5 K/uL 5.0 4.6 4.8  Hemoglobin 13.0 - 17.0 g/dL 12.0(L) 12.0(L) 12.0(L)  Hematocrit 39 - 52 % 36.9(L) 37.4(L) 37.4(L)  Platelets 150 - 400 K/uL 144(L) 158 150    . CMP Latest Ref Rng & Units 04/28/2020 12/09/2019 09/30/2019  Glucose 70 - 99 mg/dL 224(H) 160(H) 131(H)  BUN 8 - 23 mg/dL _0 Creatinine 0.61 - 1.24 mg/dL 1.15 0.98 0.96  Sodium 135 - 145 mmol/L 139 139 140  Potassium 3.5 - 5.1 mmol/L 4.3 4.1 4.3  Chloride 98 - 111 mmol/L 106 107 106  CO2 22 - 32 mmol/L _1 Calcium 8.9 - 10.3 mg/dL 9.0 8.5(L) 8.9  Total Protein 6.5 - 8.1 g/dL 7.0 7.2 7.2  Total Bilirubin 0.3 - 1.2 mg/dL 0.5 0.5 0.5  Alkaline Phos 38 - 126 U/L 73 73 76  AST 15 - 41  U/L _2 ALT 0 - 44 U/L _3 RADIOGRAPHIC STUDIES: I have personally reviewed the radiological images as listed and agreed with the findings in the report. No results found.  ASSESSMENT & PLAN:   69 y.o.  African-American male with  1) Resected Stage IV pT4a, pN3b, pM1 invasive moderately-poorly Gastric Adenocarcinoma . Her 2 neg by FISH LVI + margins neg 15/39 LN +ve, Extracapsular invasion +, some involvement  of pancreatic capsule there pM1 Presented with severe pyloric stenosis with ulceration. S/p 60 pound weight loss- now resolved. S/p distal gastrectomy, cholecystectomy and en bloc dissection off the small part of the pancreatic head  with jejunal feeding tube placement. Initial CT abdomen and pelvis did not show any local regional lymphadenopathy. Rpt CT chest/abd/pelvis from 03/22/2017 - no overt evidence of residual disease or metastatic disease. Post-surgical peripancreatic fluid collection resolving. CT chest/abd/pelvis 06/21/2017 after 6 cycles of FOLFOX -  No evidence of residual or metastatic carcinoma within the chest, abdomen, or pelvis -Started Chemoradiation on 07/18/17 with Xeloda (868m/m2 twice daily Monday through Friday while on radiation) and completed treatment on 08/24/2017. CT chest/abd/pelvis (11/16/2017) - show radiation related changes but no evidence of tumor progression at this time.  07/13/18 CT C/A/P revealed No evidence of metastatic disease in the chest, abdomen or pelvis. Previously described mildly enlarged high left para-aortic retroperitoneal node is decreased and now normal in size. Previously described peripancreatic fat stranding is decreased and probably treatment related.   03/11/19 CT A/P revealed "Status post distal gastrectomy, without findings to suggest local recurrence of disease or definite metastatic disease in the abdomen or pelvis. 2. Colonic diverticulosis without evidence of acute diverticulitis at this time. 3. Incidental  findings, as above, similar to prior studies."  09/30/2019 CT C/A/P ([phone])([phone]) revealed "1. Status post distal gastrectomy with gastrojejunostomy. No specific findings identified to suggest local tumor recurrence or metastatic disease. 2. Aortic atherosclerosis. Coronary artery calcifications noted. Aortic Atherosclerosis (ICD10-I70.0)."  #2 s/p moderate protein calorie malnutrition patient had lost about 60 pounds in the last 2-3 months but has been gaining back his lost weight well. . Wt Readings from Last 3 Encounters:  04/28/20 (!) 310 lb 1.6 oz (140.7 kg)  12/09/19 (!) 307 lb 4.8 oz (139.4 kg)  06/10/19 298 lb 12.8 oz (135.5 kg)   #3 status post port placement on 02/23/2017 by dr. BBarry Dienes  #4 h/o Clostridium difficile colitis -. No overt issues with diarrhea currently.   #5 suspected sleep apnea.Has not had a sleep study.Wife notes significant snoring which is important with weight loss. Will have to be careful with excessive sedation. -continue prn low dose trazodone for insomnia -recommended he f/u with his PCP for consideration of sleep study-pending   PLAN: -Discussed pt labwork today, 04/28/20; blood counts and chemistries look good, Glucose is elevated, Ferritin is stable 92 -No lab or clinical evidence of gastric adenocarcinoma recurrence. No indication for further treatment at this time.   -Recommend pt continue to monitoring Glucose or getting a Hgb A1C with his PCP, Dr. GIona BeardOsei-Bonsu.  -Recommend pt f/u with Dr. BBarry Dienesfor Port-a-cath removal  -Plan to repeat CT C/A/P in 23 weeks -Will see back in 6 months, with labs 1 week prior    FOLLOW UP: Portflush in 2 months CT chest/abd/pelvis in 23 weeks Portflush and labs in 23 weeks RTC with Dr KIrene Limboin 24 weeks    The total time spent in the appt was 20 minutes and more than 50% was on counseling and direct patient cares.  All of the patient's questions were answered with apparent satisfaction. The  patient knows to call the clinic with any problems, questions or concerns.    GSullivan LoneMD MFordyceAAHIVMS SSagewest Health CareCSt. Luke'S ElmoreHematology/Oncology Physician CMercy PhiladeLPhia [facility] (Office):       [phone](Work cell):  [phone](Fax):           [phone] I, JYevette Edwards am  acting as a scribe for Dr. Sullivan Lone.   .I have reviewed the above documentation for accuracy and completeness, and I agree with the above. Brunetta Genera MD

## 2020-05-04 ENCOUNTER — Telehealth: Payer: Self-pay | Admitting: Hematology

## 2020-05-04 NOTE — Telephone Encounter (Signed)
Scheduled per 06/15 los, patient has been called and voicemail was left.

## 2020-10-01 DIAGNOSIS — Z23 Encounter for immunization: Secondary | ICD-10-CM | POA: Diagnosis not present

## 2020-10-06 ENCOUNTER — Inpatient Hospital Stay: Payer: Medicare Other | Attending: Hematology

## 2020-10-06 ENCOUNTER — Inpatient Hospital Stay: Payer: Medicare Other

## 2020-10-06 ENCOUNTER — Other Ambulatory Visit: Payer: Self-pay

## 2020-10-06 DIAGNOSIS — C772 Secondary and unspecified malignant neoplasm of intra-abdominal lymph nodes: Secondary | ICD-10-CM | POA: Insufficient documentation

## 2020-10-06 DIAGNOSIS — C169 Malignant neoplasm of stomach, unspecified: Secondary | ICD-10-CM

## 2020-10-06 DIAGNOSIS — C7889 Secondary malignant neoplasm of other digestive organs: Secondary | ICD-10-CM | POA: Insufficient documentation

## 2020-10-06 DIAGNOSIS — K219 Gastro-esophageal reflux disease without esophagitis: Secondary | ICD-10-CM | POA: Insufficient documentation

## 2020-10-06 DIAGNOSIS — I471 Supraventricular tachycardia: Secondary | ICD-10-CM | POA: Insufficient documentation

## 2020-10-06 DIAGNOSIS — Z23 Encounter for immunization: Secondary | ICD-10-CM | POA: Insufficient documentation

## 2020-10-06 DIAGNOSIS — C164 Malignant neoplasm of pylorus: Secondary | ICD-10-CM | POA: Insufficient documentation

## 2020-10-06 DIAGNOSIS — Z95828 Presence of other vascular implants and grafts: Secondary | ICD-10-CM

## 2020-10-06 DIAGNOSIS — C160 Malignant neoplasm of cardia: Secondary | ICD-10-CM

## 2020-10-06 DIAGNOSIS — Z7189 Other specified counseling: Secondary | ICD-10-CM

## 2020-10-06 DIAGNOSIS — E440 Moderate protein-calorie malnutrition: Secondary | ICD-10-CM | POA: Diagnosis not present

## 2020-10-06 LAB — CBC WITH DIFFERENTIAL/PLATELET
Abs Immature Granulocytes: 0.01 10*3/uL (ref 0.00–0.07)
Basophils Absolute: 0.1 10*3/uL (ref 0.0–0.1)
Basophils Relative: 1 %
Eosinophils Absolute: 0.2 10*3/uL (ref 0.0–0.5)
Eosinophils Relative: 3 %
HCT: 36.8 % — ABNORMAL LOW (ref 39.0–52.0)
Hemoglobin: 12.1 g/dL — ABNORMAL LOW (ref 13.0–17.0)
Immature Granulocytes: 0 %
Lymphocytes Relative: 19 %
Lymphs Abs: 1.1 10*3/uL (ref 0.7–4.0)
MCH: 28.3 pg (ref 26.0–34.0)
MCHC: 32.9 g/dL (ref 30.0–36.0)
MCV: 86.2 fL (ref 80.0–100.0)
Monocytes Absolute: 0.5 10*3/uL (ref 0.1–1.0)
Monocytes Relative: 9 %
Neutro Abs: 3.8 10*3/uL (ref 1.7–7.7)
Neutrophils Relative %: 68 %
Platelets: 170 10*3/uL (ref 150–400)
RBC: 4.27 MIL/uL (ref 4.22–5.81)
RDW: 14 % (ref 11.5–15.5)
WBC: 5.6 10*3/uL (ref 4.0–10.5)
nRBC: 0 % (ref 0.0–0.2)

## 2020-10-06 LAB — CMP (CANCER CENTER ONLY)
ALT: 23 U/L (ref 0–44)
AST: 22 U/L (ref 15–41)
Albumin: 3.5 g/dL (ref 3.5–5.0)
Alkaline Phosphatase: 68 U/L (ref 38–126)
Anion gap: 11 (ref 5–15)
BUN: 13 mg/dL (ref 8–23)
CO2: 24 mmol/L (ref 22–32)
Calcium: 8.7 mg/dL — ABNORMAL LOW (ref 8.9–10.3)
Chloride: 103 mmol/L (ref 98–111)
Creatinine: 1.08 mg/dL (ref 0.61–1.24)
GFR, Estimated: >60 mL/min (ref >60)
Glucose, Bld: 188 mg/dL — ABNORMAL HIGH (ref 70–99)
Potassium: 4.3 mmol/L (ref 3.5–5.1)
Sodium: 138 mmol/L (ref 135–145)
Total Bilirubin: 0.5 mg/dL (ref 0.3–1.2)
Total Protein: 7.2 g/dL (ref 6.5–8.1)

## 2020-10-06 LAB — CEA (IN HOUSE-CHCC): CEA (CHCC-In House): 2.58 ng/mL (ref 0.00–5.00)

## 2020-10-06 MED ORDER — HEPARIN SOD (PORK) LOCK FLUSH 100 UNIT/ML IV SOLN
500.0000 [IU] | Freq: Once | INTRAVENOUS | Status: AC | PRN
  Administered 2020-10-06: 500 [IU] via INTRAVENOUS
  Filled 2020-10-06: qty 5

## 2020-10-06 MED ORDER — SODIUM CHLORIDE 0.9% FLUSH
10.0000 mL | INTRAVENOUS | Status: DC | PRN
  Administered 2020-10-06: 10 mL via INTRAVENOUS
  Filled 2020-10-06: qty 10

## 2020-10-12 NOTE — Progress Notes (Signed)
HEMATOLOGY/ONCOLOGY CLINIC NOTE  Date of Service:  10/13/20     Patient Care Team: [name], MD as PCP - General (Internal Medicine) [name] (Surgery)    CHIEF COMPLAINTS/PURPOSE OF CONSULTATION:   F/u for continued management of gastric adenocarcinoma  HISTORY OF PRESENTING ILLNESS:   [name] is a wonderful 69 y.o. male is here for continued evaluation and management of recently diagnosed pT4a, pN3b, pM1 Gastric Adenocarcinoma.  Patient was previously seen in the hospital on 01/19/2017 with a diagnosis of gastric adenocarcinoma in the gastric pylorus causing significant intrinsic stenosis . Patient had presented to primary care physician with dyspeptic symptoms, partial gastric outlet obstruction and weight loss of 60 pounds. Patient has had a distal gastrectomy, cholecystectomy with jejunal feeding tube placement by Dr. Barry Dienes on 01/10/2017 for gastric obstruction secondary to adenocarcinoma of the gastric pylorus. He was noted to have invaded into the pancreas and a bit of the anterior pancreatic head was resected en bloc with the tumor.  Pathology showed  " INVASIVE MODERATELY TO POORLY DIFFERENTIATED ADENOCARCINOMA, SPANNING 4 CM IN GREATEST DIMENSION. TUMOR INVADES THROUGH STOMACH WALL TO INVOLVE SEROSAL SURFACE.  LYMPH/VASCULAR INVASION IS IDENTIFIED.  MARGINS ARE NEGATIVE.- FIFTEEN OF THIRTY NINE LYMPH NODES POSITIVE FOR METASTATIC ADENOCARCINOMA (15/39). EXTRACAPSULAR EXTENSION IS IDENTIFIED. 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  Patient had a prolonged hospitalization post surgery  due to significant NG tube output. Needed epidural and PCA for pain control. It took a while for him to be able to tolerate progression of 2 feeding. He had an abdominal drain due to a small collection near his pancreatic head. Was treated with Augmentin for this. Patient was in the  hospital from 01/10/2017 and was eventually discharged on 01/27/2017.  Patient postponed several attempts at posthospitalization follow-up. He has been recovering at home and has been optimizing his tube feeding. Patient was recently followed up by Dr. Barry Dienes and has had a port placement on 02/23/2017 and preparation for possible chemotherapy.  He notes that his tube feeding is up to 40 mL per hour for 12 hours and has targeted is 16 ml per hour. He was recently admitted to [facility] in [place] for a C. difficile colitis that caused a severe diarrhea. he is currently on oral vancomycin has completed about 5 days of treatment and is also on probiotics.  CURRENT THERAPY: Active surveillance  PREVIOUS THERAPY:  Concurrent Chemoradiation with Xeloda. He started 07/18/17 and completed 08/25/17  INTERVAL HISTORY  [name] is here for f/u for his gastric adenocarcinoma. The patient's last visit with [name] was on 04/28/2020. The pt reports that he is doing well overall.  The pt reports that he has been feeling well, eating well, and sleeping well in the interim. The pt has received his COVID19 booster and would like to get the flu vaccine. He notes that his blood glucose levels have been stable at home. Pt has noticed some increased imbalance when walking, but this has not limited his activitiy.   Lab results (10/06/20) of CBC w/diff and CMP is as follows: all values are WNL except for Hgb at 12.1, HCT at 36.8, Glucose at 188, Calcium at 8.7. 10/06/2020 CEA at 2.58  On review of systems, pt denies diarrhea, constipation, blood/mucous in stools, abdominal pain, leg swelling and any other symptoms.   MEDICAL HISTORY:  Past Medical History:  Diagnosis Date  . Anemia   . Cancer ([place]) dx'd 11/2016  Gastric Adenocarcinoma  . Gastric outlet obstruction 01/2017  . GERD (gastroesophageal reflux disease)   . Pre-diabetes    denies  . SVT (supraventricular tachycardia) ([place])    during Admission and  Surgery- 01/2017    SURGICAL HISTORY: Past Surgical History:  Procedure Laterality Date  . CHOLECYSTECTOMY N/A 01/10/2017   Procedure: CHOLECYSTECTOMY;  Surgeon: Stark Klein, MD;  Location: [facility];  Service: General;  Laterality: N/A;  . COLONOSCOPY    . COLONOSCOPY  10/11/2011   Procedure: COLONOSCOPY;  Surgeon: Daneil Dolin, MD;  [facility];  Service: Endoscopy;  Laterality: N/A;  10:30 AM  . ESOPHAGOGASTRODUODENOSCOPY N/A 12/30/2016   Procedure: ESOPHAGOGASTRODUODENOSCOPY (EGD);  Surgeon: Carol Ada, MD;  Location: [facility];  Service: Endoscopy;  Laterality: N/A;  . GASTRECTOMY N/A 01/10/2017   Procedure: DISTAL GASTRECTOMY, JEJUNUM  FEEDING TUBE PLACEMENT;  Surgeon: Stark Klein, MD;  Location: [facility];  Service: General;  Laterality: N/A;  . IR GENERIC HISTORICAL  01/24/2017   IR Elfrida DUODEN/JEJUNO TUBE PERCUT W/FLUORO 01/24/2017 Aletta Edouard, MD [facility]  . IR REPLC DUODEN/JEJUNO TUBE PERCUT W/FLUORO  03/26/2017  . PORTACATH PLACEMENT N/A 02/23/2017   Procedure: INSERTION PORT-A-CATH;  Surgeon: Stark Klein, MD;  Location: [facility];  Service: General;  Laterality: N/A;  . testicle removed  1982    SOCIAL HISTORY: Social History   Socioeconomic History  . Marital status: Married    Spouse name: Not on file  . Number of children: Not on file  . Years of education: Not on file  . Highest education level: Not on file  Occupational History  . Not on file  Tobacco Use  . Smoking status: Never Smoker  . Smokeless tobacco: Never Used  Vaping Use  . Vaping Use: Unknown  Substance and Sexual Activity  . Alcohol use: No    Comment: occ beer or wine but none in last 6 months   . Drug use: No  . Sexual activity: Not on file  Other Topics Concern  . Not on file  Social History Narrative  . Not on file   Social Determinants of Health   Financial Resource Strain:   . Difficulty of Paying Living Expenses: Not on file  Food Insecurity:   . Worried About  Charity fundraiser in the Last Year: Not on file  . Ran Out of Food in the Last Year: Not on file  Transportation Needs:   . Lack of Transportation (Medical): Not on file  . Lack of Transportation (Non-Medical): Not on file  Physical Activity:   . Days of Exercise per Week: Not on file  . Minutes of Exercise per Session: Not on file  Stress:   . Feeling of Stress : Not on file  Social Connections:   . Frequency of Communication with Friends and Family: Not on file  . Frequency of Social Gatherings with Friends and Family: Not on file  . Attends Religious Services: Not on file  . Active Member of Clubs or Organizations: Not on file  . Attends Archivist Meetings: Not on file  . Marital Status: Not on file  Intimate Partner Violence:   . Fear of Current or Ex-Partner: Not on file  . Emotionally Abused: Not on file  . Physically Abused: Not on file  . Sexually Abused: Not on file    FAMILY HISTORY: Family History  Problem Relation Age of Onset  . Prostate cancer Father   . Colon cancer Neg Hx  ALLERGIES:  is allergic to no known allergies.  MEDICATIONS:  Current Outpatient Medications  Medication Sig Dispense Refill  . ferrous sulfate 325 (65 FE) MG tablet Take 325 mg by mouth daily with breakfast.    . lidocaine-prilocaine (EMLA) cream Apply to affected area once (Patient not taking: Reported on 06/12/2019) 30 g 3  . Multiple Vitamin (MULTIVITAMIN) tablet Take 1 tablet by mouth daily.    [name] Kitchen omeprazole (PRILOSEC) 20 MG capsule Take 1 capsule (20 mg total) by mouth 2 (two) times daily before a meal. (Patient not taking: Reported on 04/19/2018) 60 capsule 2  . ondansetron (ZOFRAN) 8 MG tablet Take 1 tablet (8 mg total) by mouth every 8 (eight) hours as needed for refractory nausea / vomiting. Start on day 3 after chemotherapy. (Patient not taking: Reported on 04/19/2018) 30 tablet 1   Current Facility-Administered Medications  Medication Dose Route Frequency Provider  Last Rate Last Admin  . influenza vaccine adjuvanted (FLUAD) injection 0.5 mL  0.5 mL Intramuscular Once Irene Limbo Cloria Spring, MD        REVIEW OF SYSTEMS:   A 10+ POINT REVIEW OF SYSTEMS WAS OBTAINED including neurology, dermatology, psychiatry, cardiac, respiratory, lymph, extremities, GI, GU, Musculoskeletal, constitutional, breasts, reproductive, HEENT.  All pertinent positives are noted in the HPI.  All others are negative.   PHYSICAL EXAMINATION:   Vitals:   10/13/20 1117  BP: (!) 149/65  Pulse: 74  Resp: 18  Temp: 97.9 F (36.6 C)  SpO2: 100%   Filed Weights   10/13/20 1117  Weight: (!) 311 lb 4.8 oz (141.2 kg)   Body mass index is 43.42 kg/m.   GENERAL:alert, in no acute distress and comfortable SKIN: no acute rashes, no significant lesions EYES: conjunctiva are pink and non-injected, sclera anicteric OROPHARYNX: MMM, no exudates, no oropharyngeal erythema or ulceration NECK: supple, no JVD LYMPH:  no palpable lymphadenopathy in the cervical, axillary or inguinal regions LUNGS: clear to auscultation b/l with normal respiratory effort HEART: regular rate & rhythm ABDOMEN:  normoactive bowel sounds , non tender, not distended. No palpable hepatosplenomegaly.  Extremity: no pedal edema PSYCH: alert & oriented x 3 with fluent speech NEURO: no focal motor/sensory deficits  LABORATORY DATA:  I have reviewed the data as listed  . CBC Latest Ref Rng & Units 10/06/2020 04/28/2020 12/09/2019  WBC 4.0 - 10.5 K/uL 5.6 5.0 4.6  Hemoglobin 13.0 - 17.0 g/dL 12.1(L) 12.0(L) 12.0(L)  Hematocrit 39 - 52 % 36.8(L) 36.9(L) 37.4(L)  Platelets 150 - 400 K/uL 170 144(L) 158    . CMP Latest Ref Rng & Units 10/06/2020 04/28/2020 12/09/2019  Glucose 70 - 99 mg/dL 188(H) 224(H) 160(H)  BUN 8 - 23 mg/dL _0 Creatinine 0.61 - 1.24 mg/dL 1.08 1.15 0.98  Sodium 135 - 145 mmol/L 138 139 139  Potassium 3.5 - 5.1 mmol/L 4.3 4.3 4.1  Chloride 98 - 111 mmol/L 103 106 107  CO2 22 - 32  mmol/L _1 Calcium 8.9 - 10.3 mg/dL 8.7(L) 9.0 8.5(L)  Total Protein 6.5 - 8.1 g/dL 7.2 7.0 7.2  Total Bilirubin 0.3 - 1.2 mg/dL 0.5 0.5 0.5  Alkaline Phos 38 - 126 U/L 68 73 73  AST 15 - 41 U/L _2 ALT 0 - 44 U/L _3 RADIOGRAPHIC STUDIES: I have personally reviewed the radiological images as listed and agreed with the findings in the report. No results found.  ASSESSMENT & PLAN:  69 y.o.  African-American male with  1) Resected Stage IV pT4a, pN3b, pM1 invasive moderately-poorly Gastric Adenocarcinoma . Her 2 neg by FISH LVI + margins neg 15/39 LN +ve, Extracapsular invasion +, some involvement of pancreatic capsule there pM1 Presented with severe pyloric stenosis with ulceration. S/p 60 pound weight loss- now resolved. S/p distal gastrectomy, cholecystectomy and en bloc dissection off the small part of the pancreatic head  with jejunal feeding tube placement. Initial CT abdomen and pelvis did not show any local regional lymphadenopathy. Rpt CT chest/abd/pelvis from 03/22/2017 - no overt evidence of residual disease or metastatic disease. Post-surgical peripancreatic fluid collection resolving. CT chest/abd/pelvis 06/21/2017 after 6 cycles of FOLFOX -  No evidence of residual or metastatic carcinoma within the chest, abdomen, or pelvis -Started Chemoradiation on 07/18/17 with Xeloda (831m/m2 twice daily Monday through Friday while on radiation) and completed treatment on 08/24/2017. CT chest/abd/pelvis (11/16/2017) - show radiation related changes but no evidence of tumor progression at this time.  07/13/18 CT C/A/P revealed No evidence of metastatic disease in the chest, abdomen or pelvis. Previously described mildly enlarged high left para-aortic retroperitoneal node is decreased and now normal in size. Previously described peripancreatic fat stranding is decreased and probably treatment related.   03/11/19 CT A/P revealed "Status post distal gastrectomy, without  findings to suggest local recurrence of disease or definite metastatic disease in the abdomen or pelvis. 2. Colonic diverticulosis without evidence of acute diverticulitis at this time. 3. Incidental findings, as above, similar to prior studies."  09/30/2019 CT C/A/P ([phone])([phone]) revealed "1. Status post distal gastrectomy with gastrojejunostomy. No specific findings identified to suggest local tumor recurrence or metastatic disease. 2. Aortic atherosclerosis. Coronary artery calcifications noted. Aortic Atherosclerosis (ICD10-I70.0)."  #2 s/p moderate protein calorie malnutrition patient had lost about 60 pounds in the last 2-3 months but has been gaining back his lost weight well. . Wt Readings from Last 3 Encounters:  10/13/20 (!) 311 lb 4.8 oz (141.2 kg)  04/28/20 (!) 310 lb 1.6 oz (140.7 kg)  12/09/19 (!) 307 lb 4.8 oz (139.4 kg)   #3 status post port placement on 02/23/2017 by dr. BBarry Dienes  #4 h/o Clostridium difficile colitis -. No overt issues with diarrhea currently.   #5 suspected sleep apnea.Has not had a sleep study.Wife notes significant snoring which is important with weight loss. Will have to be careful with excessive sedation. -continue prn low dose trazodone for insomnia -recommended he f/u with his PCP for consideration of sleep study-pending   PLAN: -Discussed pt labwork today, 10/06/2020; blood counts and chemistries are look good, CEA is WNL.  -No lab or clinical evidence of Gastric Adenocarcinoma recurrence at this time. Will continue watchful observation. -Recommend pt receive annual flu vaccine. Will give in clinic today.  -Will get CT C/A/P in 11 weeks  -Will get labs + Portflush in 11 weeks -Will see back in 12 weeks via phone   FOLLOW UP: Flu shot today CT chest/abd/pelvis in 11 weeks Portflush and labs in 11 weeks Phone visit with Dr KIrene Limboin 12 weeks   The total time spent in the appt was 25 minutes and more than 50% was on counseling and  direct patient cares.  All of the patient's questions were answered with apparent satisfaction. The patient knows to call the clinic with any problems, questions or concerns.    GSullivan LoneMD MBostoniaAAHIVMS SEnt [facility] Center Of August[facility] LLCCPeach Regional Medical CenterHematology/Oncology Physician CMercy Westbrook (Office):       [phone](Work cell):  [phone] (Fax):           [phone]  I, Yevette Edwards, am acting as a scribe for Dr. Sullivan Lone.   .I have reviewed the above documentation for accuracy and completeness, and I agree with the above. Brunetta Genera MD

## 2020-10-13 ENCOUNTER — Inpatient Hospital Stay (HOSPITAL_BASED_OUTPATIENT_CLINIC_OR_DEPARTMENT_OTHER): Payer: Medicare Other | Admitting: Hematology

## 2020-10-13 ENCOUNTER — Other Ambulatory Visit: Payer: Self-pay

## 2020-10-13 VITALS — BP 149/65 | HR 74 | Temp 97.9°F | Resp 18 | Ht 71.0 in | Wt 311.3 lb

## 2020-10-13 DIAGNOSIS — E440 Moderate protein-calorie malnutrition: Secondary | ICD-10-CM | POA: Diagnosis not present

## 2020-10-13 DIAGNOSIS — C169 Malignant neoplasm of stomach, unspecified: Secondary | ICD-10-CM | POA: Diagnosis not present

## 2020-10-13 DIAGNOSIS — C772 Secondary and unspecified malignant neoplasm of intra-abdominal lymph nodes: Secondary | ICD-10-CM | POA: Diagnosis not present

## 2020-10-13 DIAGNOSIS — C164 Malignant neoplasm of pylorus: Secondary | ICD-10-CM | POA: Diagnosis not present

## 2020-10-13 DIAGNOSIS — C7889 Secondary malignant neoplasm of other digestive organs: Secondary | ICD-10-CM | POA: Diagnosis not present

## 2020-10-13 DIAGNOSIS — Z23 Encounter for immunization: Secondary | ICD-10-CM | POA: Diagnosis not present

## 2020-10-13 DIAGNOSIS — K219 Gastro-esophageal reflux disease without esophagitis: Secondary | ICD-10-CM | POA: Diagnosis not present

## 2020-10-13 MED ORDER — INFLUENZA VAC A&B SA ADJ QUAD 0.5 ML IM PRSY
0.5000 mL | PREFILLED_SYRINGE | Freq: Once | INTRAMUSCULAR | Status: AC
  Administered 2020-10-13: 0.5 mL via INTRAMUSCULAR

## 2020-10-13 MED ORDER — INFLUENZA VAC A&B SA ADJ QUAD 0.5 ML IM PRSY
PREFILLED_SYRINGE | INTRAMUSCULAR | Status: AC
  Filled 2020-10-13: qty 0.5

## 2020-12-29 ENCOUNTER — Telehealth: Payer: Self-pay | Admitting: Hematology

## 2020-12-29 NOTE — Telephone Encounter (Signed)
Reminded patient about upcoming 2/16 appointment. Patient is aware.

## 2020-12-30 ENCOUNTER — Other Ambulatory Visit: Payer: Self-pay

## 2020-12-30 ENCOUNTER — Ambulatory Visit (HOSPITAL_COMMUNITY)
Admission: RE | Admit: 2020-12-30 | Discharge: 2020-12-30 | Disposition: A | Discharge status: Home / Self Care | Payer: Medicare Other | Source: Ambulatory Visit | Attending: Hematology | Admitting: Hematology

## 2020-12-30 ENCOUNTER — Inpatient Hospital Stay: Payer: Medicare Other

## 2020-12-30 ENCOUNTER — Encounter (HOSPITAL_COMMUNITY): Payer: Self-pay

## 2020-12-30 ENCOUNTER — Inpatient Hospital Stay: Payer: Medicare Other | Attending: Hematology

## 2020-12-30 DIAGNOSIS — Z95828 Presence of other vascular implants and grafts: Secondary | ICD-10-CM

## 2020-12-30 DIAGNOSIS — C169 Malignant neoplasm of stomach, unspecified: Secondary | ICD-10-CM | POA: Insufficient documentation

## 2020-12-30 DIAGNOSIS — C164 Malignant neoplasm of pylorus: Secondary | ICD-10-CM | POA: Insufficient documentation

## 2020-12-30 DIAGNOSIS — E440 Moderate protein-calorie malnutrition: Secondary | ICD-10-CM | POA: Insufficient documentation

## 2020-12-30 DIAGNOSIS — Z79899 Other long term (current) drug therapy: Secondary | ICD-10-CM | POA: Diagnosis not present

## 2020-12-30 DIAGNOSIS — I2510 Atherosclerotic heart disease of native coronary artery without angina pectoris: Secondary | ICD-10-CM | POA: Diagnosis not present

## 2020-12-30 DIAGNOSIS — K760 Fatty (change of) liver, not elsewhere classified: Secondary | ICD-10-CM | POA: Diagnosis not present

## 2020-12-30 DIAGNOSIS — I700 Atherosclerosis of aorta: Secondary | ICD-10-CM | POA: Diagnosis not present

## 2020-12-30 DIAGNOSIS — Z7189 Other specified counseling: Secondary | ICD-10-CM

## 2020-12-30 DIAGNOSIS — C160 Malignant neoplasm of cardia: Secondary | ICD-10-CM

## 2020-12-30 DIAGNOSIS — K449 Diaphragmatic hernia without obstruction or gangrene: Secondary | ICD-10-CM | POA: Diagnosis not present

## 2020-12-30 LAB — CMP (CANCER CENTER ONLY)
ALT: 25 U/L (ref 0–44)
AST: 18 U/L (ref 15–41)
Albumin: 3.6 g/dL (ref 3.5–5.0)
Alkaline Phosphatase: 73 U/L (ref 38–126)
Anion gap: 7 (ref 5–15)
BUN: 12 mg/dL (ref 8–23)
CO2: 25 mmol/L (ref 22–32)
Calcium: 8.8 mg/dL — ABNORMAL LOW (ref 8.9–10.3)
Chloride: 103 mmol/L (ref 98–111)
Creatinine: 1.22 mg/dL (ref 0.61–1.24)
GFR, Estimated: >60 mL/min (ref >60)
Glucose, Bld: 314 mg/dL — ABNORMAL HIGH (ref 70–99)
Potassium: 4.1 mmol/L (ref 3.5–5.1)
Sodium: 135 mmol/L (ref 135–145)
Total Bilirubin: 0.5 mg/dL (ref 0.3–1.2)
Total Protein: 7.1 g/dL (ref 6.5–8.1)

## 2020-12-30 LAB — CBC WITH DIFFERENTIAL/PLATELET
Abs Immature Granulocytes: 0.03 10*3/uL (ref 0.00–0.07)
Basophils Absolute: 0 10*3/uL (ref 0.0–0.1)
Basophils Relative: 1 %
Eosinophils Absolute: 0.1 10*3/uL (ref 0.0–0.5)
Eosinophils Relative: 2 %
HCT: 34.2 % — ABNORMAL LOW (ref 39.0–52.0)
Hemoglobin: 11.5 g/dL — ABNORMAL LOW (ref 13.0–17.0)
Immature Granulocytes: 1 %
Lymphocytes Relative: 21 %
Lymphs Abs: 1 10*3/uL (ref 0.7–4.0)
MCH: 28.5 pg (ref 26.0–34.0)
MCHC: 33.6 g/dL (ref 30.0–36.0)
MCV: 84.7 fL (ref 80.0–100.0)
Monocytes Absolute: 0.4 10*3/uL (ref 0.1–1.0)
Monocytes Relative: 9 %
Neutro Abs: 3.3 10*3/uL (ref 1.7–7.7)
Neutrophils Relative %: 66 %
Platelets: 136 10*3/uL — ABNORMAL LOW (ref 150–400)
RBC: 4.04 MIL/uL — ABNORMAL LOW (ref 4.22–5.81)
RDW: 13.7 % (ref 11.5–15.5)
WBC: 4.9 10*3/uL (ref 4.0–10.5)
nRBC: 0 % (ref 0.0–0.2)

## 2020-12-30 MED ORDER — IOHEXOL 9 MG/ML PO SOLN
1000.0000 mL | ORAL | Status: AC
Start: 2020-12-30 — End: 2020-12-30
  Administered 2020-12-30: 1000 mL via ORAL

## 2020-12-30 MED ORDER — HEPARIN SOD (PORK) LOCK FLUSH 100 UNIT/ML IV SOLN
INTRAVENOUS | Status: AC
  Filled 2020-12-30: qty 5

## 2020-12-30 MED ORDER — IOHEXOL 9 MG/ML PO SOLN
ORAL | Status: AC
  Filled 2020-12-30: qty 1000

## 2020-12-30 MED ORDER — IOHEXOL 300 MG/ML  SOLN
100.0000 mL | Freq: Once | INTRAMUSCULAR | Status: AC | PRN
  Administered 2020-12-30: 100 mL via INTRAVENOUS

## 2020-12-30 MED ORDER — SODIUM CHLORIDE 0.9% FLUSH
10.0000 mL | INTRAVENOUS | Status: DC | PRN
  Administered 2020-12-30: 10 mL via INTRAVENOUS
  Filled 2020-12-30: qty 10

## 2020-12-30 MED ORDER — HEPARIN SOD (PORK) LOCK FLUSH 100 UNIT/ML IV SOLN
500.0000 [IU] | Freq: Once | INTRAVENOUS | Status: AC
  Administered 2020-12-30: 500 [IU] via INTRAVENOUS

## 2020-12-30 NOTE — Patient Instructions (Signed)
Implanted Port Insertion, Care After This sheet gives you information about how to care for yourself after your procedure. Your health care provider may also give you more specific instructions. If you have problems or questions, contact your health care provider. What can I expect after the procedure? After the procedure, it is common to have:  Discomfort at the port insertion site.  Bruising on the skin over the port. This should improve over 3-4 days. Follow these instructions at home: Port care  After your port is placed, you will get a manufacturer's information card. The card has information about your port. Keep this card with you at all times.  Take care of the port as told by your health care provider. Ask your health care provider if you or a family member can get training for taking care of the port at home. A home health care nurse may also take care of the port.  Make sure to remember what type of port you have. Incision care  Follow instructions from your health care provider about how to take care of your port insertion site. Make sure you: ? Wash your hands with soap and water before and after you change your bandage (dressing). If soap and water are not available, use hand sanitizer. ? Change your dressing as told by your health care provider. ? Leave stitches (sutures), skin glue, or adhesive strips in place. These skin closures may need to stay in place for 2 weeks or longer. If adhesive strip edges start to loosen and curl up, you may trim the loose edges. Do not remove adhesive strips completely unless your health care provider tells you to do that.  Check your port insertion site every day for signs of infection. Check for: ? Redness, swelling, or pain. ? Fluid or blood. ? Warmth. ? Pus or a bad smell.      Activity  Return to your normal activities as told by your health care provider. Ask your health care provider what activities are safe for you.  Do not  lift anything that is heavier than 10 lb (4.5 kg), or the limit that you are told, until your health care provider says that it is safe. General instructions  Take over-the-counter and prescription medicines only as told by your health care provider.  Do not take baths, swim, or use a hot tub until your health care provider approves. Ask your health care provider if you may take showers. You may only be allowed to take sponge baths.  Do not drive for 24 hours if you were given a sedative during your procedure.  Wear a medical alert bracelet in case of an emergency. This will tell any health care providers that you have a port.  Keep all follow-up visits as told by your health care provider. This is important. Contact a health care provider if:  You cannot flush your port with saline as directed, or you cannot draw blood from the port.  You have a fever or chills.  You have redness, swelling, or pain around your port insertion site.  You have fluid or blood coming from your port insertion site.  Your port insertion site feels warm to the touch.  You have pus or a bad smell coming from the port insertion site. Get help right away if:  You have chest pain or shortness of breath.  You have bleeding from your port that you cannot control. Summary  Take care of the port as told by your   health care provider. Keep the manufacturer's information card with you at all times.  Change your dressing as told by your health care provider.  Contact a health care provider if you have a fever or chills or if you have redness, swelling, or pain around your port insertion site.  Keep all follow-up visits as told by your health care provider. This information is not intended to replace advice given to you by your health care provider. Make sure you discuss any questions you have with your health care provider. Document Revised: 05/29/2018 Document Reviewed: 05/29/2018 Elsevier Patient Education   2021 [employer]  

## 2021-01-05 NOTE — Progress Notes (Signed)
HEMATOLOGY/ONCOLOGY CLINIC NOTE  Date of Service:  01/05/21     Patient Care Team: Steven Mccreedy, MD as PCP - General (Internal Medicine) [name] (Surgery)    CHIEF COMPLAINTS/PURPOSE OF CONSULTATION:   F/u for continued management of gastric adenocarcinoma  HISTORY OF PRESENTING ILLNESS:   [name] is a wonderful 70 y.o. male is here for continued evaluation and management of recently diagnosed pT4a, pN3b, pM1 Gastric Adenocarcinoma.  Patient was previously seen in the hospital on 01/19/2017 with a diagnosis of gastric adenocarcinoma in the gastric pylorus causing significant intrinsic stenosis . Patient had presented to primary care physician with dyspeptic symptoms, partial gastric outlet obstruction and weight loss of 60 pounds. Patient has had a distal gastrectomy, cholecystectomy with jejunal feeding tube placement by Dr. Barry Dienes on 01/10/2017 for gastric obstruction secondary to adenocarcinoma of the gastric pylorus. He was noted to have invaded into the pancreas and a bit of the anterior pancreatic head was resected en bloc with the tumor.  Pathology showed  " INVASIVE MODERATELY TO POORLY DIFFERENTIATED ADENOCARCINOMA, SPANNING 4 CM IN GREATEST DIMENSION. TUMOR INVADES THROUGH STOMACH WALL TO INVOLVE SEROSAL SURFACE.  LYMPH/VASCULAR INVASION IS IDENTIFIED.  MARGINS ARE NEGATIVE.- FIFTEEN OF THIRTY NINE LYMPH NODES POSITIVE FOR METASTATIC ADENOCARCINOMA (15/39). EXTRACAPSULAR EXTENSION IS IDENTIFIED. 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  Patient had a prolonged hospitalization post surgery  due to significant NG tube output. Needed epidural and PCA for pain control. It took a while for him to be able to tolerate progression of 2 feeding. He had an abdominal drain due to a small collection near his pancreatic head. Was treated with Augmentin for this. Patient was in the  hospital from 01/10/2017 and was eventually discharged on 01/27/2017.  Patient postponed several attempts at posthospitalization follow-up. He has been recovering at home and has been optimizing his tube feeding. Patient was recently followed up by Dr. Barry Dienes and has had a port placement on 02/23/2017 and preparation for possible chemotherapy.  He notes that his tube feeding is up to 40 mL per hour for 12 hours and has targeted is 16 ml per hour. He was recently admitted to [facility] in [place] for a C. difficile colitis that caused a severe diarrhea. he is currently on oral vancomycin has completed about 5 days of treatment and is also on probiotics.  CURRENT THERAPY: Active surveillance  PREVIOUS THERAPY:  Concurrent Chemoradiation with Xeloda. He started 07/18/17 and completed 08/25/17  INTERVAL HISTORY   I connected with Jerl Strong on 01/06/2021 by telephone and verified that I am speaking with the correct person using two identifiers.   I discussed the limitations of evaluation and management by telemedicine. The patient expressed understanding and agreed to proceed.   Other persons participating in the visit and their role in the encounter:                                                         - [name], Medical Scribe     Patient's location: Home Provider's location: [place] [facility] [place] is here for f/u for his gastric adenocarcinoma. The patient's last visit with [name] was on 10/13/2020. The pt reports that he is doing well overall.  The pt reports  no new symptoms or concerns. The pt notes some confusion with scheduling as they left a voice message he did not receive and the pt showed up in person today for his phone visit. The pt was able to go back home and have phone visit as normal. The pt denies any recent infection issues or viral symptoms. The pt did not receive a sleep study for sleep apnea at this time and notes no symptoms with any related issues. The pt  denies fatigue and snoring when sleeping (much improved since when noted by his wife in visit).  The pt denies any medications for blood sugars and is just diet-controlling this at this time. The pt notes that the contrast dye bothers the pt's stomach and he notes it takes one week for symptoms to reside and be alleviated.  Of note since the patient's last visit, pt has had CT Chest Abd Pel w contrast ([id number]) on 12/30/2020, which revealed "1. No definite evidence for recurrent or metastatic disease in the chest, abdomen, or pelvis. 2. Tiny bilateral pulmonary nodules. Attention on follow-up recommended. 3. Tiny hiatal hernia. 4. Hepatic steatosis. 5. Aortic Atherosclerosis (ICD10-I70.0)."  Lab results 12/30/2020 of CBC w/diff and CMP is as follows: all values are WNL except for Glucose of 314, Calcium of 8.8, RBC of 4.04, Hgb of 11.5, HCT of 34.2, Plt of 136K.   On review of systems, pt denies decreased appetite, unexpected weight changes, changes in bowel habits, difficulty sleeping or insomnia, acute respiratory symptoms, infection issues, fatigue, and any other symptoms.    MEDICAL HISTORY:  Past Medical History:  Diagnosis Date  . Anemia   . Cancer ([place]) dx'd 11/2016   Gastric Adenocarcinoma  . Gastric outlet obstruction 01/2017  . GERD (gastroesophageal reflux disease)   . Pre-diabetes    denies  . SVT (supraventricular tachycardia) ([place])    during Admission and Surgery- 01/2017    SURGICAL HISTORY: Past Surgical History:  Procedure Laterality Date  . CHOLECYSTECTOMY N/A 01/10/2017   Procedure: CHOLECYSTECTOMY;  Surgeon: Stark Klein, MD;  Location: [facility];  Service: General;  Laterality: N/A;  . COLONOSCOPY    . COLONOSCOPY  10/11/2011   Procedure: COLONOSCOPY;  Surgeon: Daneil Dolin, MD;  [facility];  Service: Endoscopy;  Laterality: N/A;  10:30 AM  . ESOPHAGOGASTRODUODENOSCOPY N/A 12/30/2016   Procedure: ESOPHAGOGASTRODUODENOSCOPY (EGD);  Surgeon: Carol Ada, MD;  Location: [facility];  Service: Endoscopy;  Laterality: N/A;  . GASTRECTOMY N/A 01/10/2017   Procedure: DISTAL GASTRECTOMY, JEJUNUM  FEEDING TUBE PLACEMENT;  Surgeon: Stark Klein, MD;  Location: [facility];  Service: General;  Laterality: N/A;  . IR GENERIC HISTORICAL  01/24/2017   IR [place] DUODEN/JEJUNO TUBE PERCUT W/FLUORO 01/24/2017 Aletta Edouard, MD [facility]-INTER[facility]  . IR REPLC DUODEN/JEJUNO TUBE PERCUT W/FLUORO  03/26/2017  . PORTACATH PLACEMENT N/A 02/23/2017   Procedure: INSERTION PORT-A-CATH;  Surgeon: Stark Klein, MD;  Location: [facility];  Service: General;  Laterality: N/A;  . testicle removed  1982    SOCIAL HISTORY: Social History   Socioeconomic History  . Marital status: Married    Spouse name: Not on file  . Number of children: Not on file  . Years of education: Not on file  . Highest education level: Not on file  Occupational History  . Not on file  Tobacco Use  . Smoking status: Never Smoker  . Smokeless tobacco: Never Used  Vaping Use  . Vaping Use: Unknown  Substance and Sexual Activity  .  Alcohol use: No    Comment: occ beer or wine but none in last 6 months   . Drug use: No  . Sexual activity: Not on file  Other Topics Concern  . Not on file  Social History Narrative  . Not on file   Social Determinants of Health   Financial Resource Strain: Not on file  Food Insecurity: Not on file  Transportation Needs: Not on file  Physical Activity: Not on file  Stress: Not on file  Social Connections: Not on file  Intimate Partner Violence: Not on file    FAMILY HISTORY: Family History  Problem Relation Age of Onset  . Prostate cancer Father   . Colon cancer Neg Hx     ALLERGIES:  is allergic to no known allergies.  MEDICATIONS:  Current Outpatient Medications  Medication Sig Dispense Refill  . ferrous sulfate 325 (65 FE) MG tablet Take 325 mg by mouth daily with breakfast.    . lidocaine-prilocaine (EMLA) cream Apply to affected area once  (Patient not taking: Reported on 06/12/2019) 30 g 3  . Multiple Vitamin (MULTIVITAMIN) tablet Take 1 tablet by mouth daily.    [name] Kitchen omeprazole (PRILOSEC) 20 MG capsule Take 1 capsule (20 mg total) by mouth 2 (two) times daily before a meal. (Patient not taking: Reported on 04/19/2018) 60 capsule 2  . ondansetron (ZOFRAN) 8 MG tablet Take 1 tablet (8 mg total) by mouth every 8 (eight) hours as needed for refractory nausea / vomiting. Start on day 3 after chemotherapy. (Patient not taking: Reported on 04/19/2018) 30 tablet 1   No current facility-administered medications for this visit.    REVIEW OF SYSTEMS:   10 Point review of Systems was done is negative except as noted above.  PHYSICAL EXAMINATION:   Telehealth Visit  LABORATORY DATA:  I have reviewed the data as listed  CBC Latest Ref Rng & Units 12/30/2020 10/06/2020 04/28/2020  WBC 4.0 - 10.5 K/uL 4.9 5.6 5.0  Hemoglobin 13.0 - 17.0 g/dL 11.5(L) 12.1(L) 12.0(L)  Hematocrit 39.0 - 52.0 % 34.2(L) 36.8(L) 36.9(L)  Platelets 150 - 400 K/uL 136(L) 170 144(L)    CMP Latest Ref Rng & Units 12/30/2020 10/06/2020 04/28/2020  Glucose 70 - 99 mg/dL 314(H) 188(H) 224(H)  BUN 8 - 23 mg/dL _0 Creatinine 0.61 - 1.24 mg/dL 1.22 1.08 1.15  Sodium 135 - 145 mmol/L 135 138 139  Potassium 3.5 - 5.1 mmol/L 4.1 4.3 4.3  Chloride 98 - 111 mmol/L 103 103 106  CO2 22 - 32 mmol/L _1 Calcium 8.9 - 10.3 mg/dL 8.8(L) 8.7(L) 9.0  Total Protein 6.5 - 8.1 g/dL 7.1 7.2 7.0  Total Bilirubin 0.3 - 1.2 mg/dL 0.5 0.5 0.5  Alkaline Phos 38 - 126 U/L 73 68 73  AST 15 - 41 U/L _2 ALT 0 - 44 U/L _3 RADIOGRAPHIC STUDIES: I have personally reviewed the radiological images as listed and agreed with the findings in the report. CT CHEST ABDOMEN PELVIS W CONTRAST  Result Date: 12/31/2020 CLINICAL DATA:  Gastric cancer.  Restaging. EXAM: CT CHEST, ABDOMEN, AND PELVIS WITH CONTRAST TECHNIQUE: Multidetector CT imaging of the chest, abdomen and  pelvis was performed following the standard protocol during bolus administration of intravenous contrast. CONTRAST:  170m OMNIPAQUE IOHEXOL 300 MG/ML  SOLN COMPARISON:  09/30/2019 FINDINGS: CT CHEST FINDINGS Cardiovascular: The heart size is normal. No substantial pericardial effusion. Coronary artery calcification is evident.  Atherosclerotic calcification is noted in the wall of the thoracic aorta. Left Port-A-Cath tip is positioned in the upper SVC. Mediastinum/Nodes: No mediastinal lymphadenopathy. There is no hilar lymphadenopathy. Tiny hiatal hernia. The esophagus has normal imaging features. There is no axillary lymphadenopathy. Lungs/Pleura: 2 mm peripheral right upper lobe nodule identified on 76/7. 4 mm left upper lobe nodule noted on 71/7 2 mm right middle lobe nodule evident on 88/7. No overtly suspicious nodule or mass. No focal airspace consolidation. No pleural effusion. Musculoskeletal: No worrisome lytic or sclerotic osseous abnormality. CT ABDOMEN PELVIS FINDINGS Hepatobiliary: The liver shows diffusely decreased attenuation suggesting fat deposition. No suspicious focal abnormality within the liver parenchyma. Gallbladder is surgically absent. No intrahepatic or extrahepatic biliary dilation. Pancreas: No focal mass lesion. No dilatation of the main duct. No intraparenchymal cyst. No peripancreatic edema. Spleen: No splenomegaly. No focal mass lesion. Adrenals/Urinary Tract: No adrenal nodule or mass. Early contrast excretion noted in the kidneys. Kidneys otherwise unremarkable. No evidence for hydroureter. The urinary bladder appears normal for the degree of distention. Stomach/Bowel: Status post distal gastrectomy. Roux limb is nondilated. No small bowel wall thickening. No small bowel dilatation. The terminal ileum is normal. The appendix is normal. No gross colonic mass. No colonic wall thickening. Vascular/Lymphatic: No abdominal aortic aneurysm. There is no gastrohepatic or hepatoduodenal  ligament lymphadenopathy. No retroperitoneal or mesenteric lymphadenopathy. No pelvic sidewall lymphadenopathy. Reproductive: The prostate gland and seminal vesicles are unremarkable. Other: No intraperitoneal free fluid. Musculoskeletal: No worrisome lytic or sclerotic osseous abnormality. IMPRESSION: 1. No definite evidence for recurrent or metastatic disease in the chest, abdomen, or pelvis. 2. Tiny bilateral pulmonary nodules. Attention on follow-up recommended. 3. Tiny hiatal hernia. 4. Hepatic steatosis. 5. Aortic Atherosclerosis (ICD10-I70.0). Electronically Signed   By: Misty Stanley M.D.   On: 12/31/2020 10:25    ASSESSMENT & PLAN:   70 y.o.  African-American male with  1) Resected Stage IV pT4a, pN3b, pM1 invasive moderately-poorly Gastric Adenocarcinoma . Her 2 neg by FISH LVI + margins neg 15/39 LN +ve, Extracapsular invasion +, some involvement of pancreatic capsule there pM1 Presented with severe pyloric stenosis with ulceration. S/p 60 pound weight loss- now resolved. S/p distal gastrectomy, cholecystectomy and en bloc dissection off the small part of the pancreatic head  with jejunal feeding tube placement. Initial CT abdomen and pelvis did not show any local regional lymphadenopathy. Rpt CT chest/abd/pelvis from 03/22/2017 - no overt evidence of residual disease or metastatic disease. Post-surgical peripancreatic fluid collection resolving. CT chest/abd/pelvis 06/21/2017 after 6 cycles of FOLFOX -  No evidence of residual or metastatic carcinoma within the chest, abdomen, or pelvis -Started Chemoradiation on 07/18/17 with Xeloda (880m/m2 twice daily Monday through Friday while on radiation) and completed treatment on 08/24/2017. CT chest/abd/pelvis (11/16/2017) - show radiation related changes but no evidence of tumor progression at this time.  07/13/18 CT C/A/P revealed No evidence of metastatic disease in the chest, abdomen or pelvis. Previously described mildly enlarged high left  para-aortic retroperitoneal node is decreased and now normal in size. Previously described peripancreatic fat stranding is decreased and probably treatment related.   03/11/19 CT A/P revealed "Status post distal gastrectomy, without findings to suggest local recurrence of disease or definite metastatic disease in the abdomen or pelvis. 2. Colonic diverticulosis without evidence of acute diverticulitis at this time. 3. Incidental findings, as above, similar to prior studies."  09/30/2019 CT C/A/P [phone]) revealed "1. Status post distal gastrectomy with gastrojejunostomy. No specific findings identified to suggest local tumor recurrence  or metastatic disease. 2. Aortic atherosclerosis. Coronary artery calcifications noted. Aortic Atherosclerosis (ICD10-I70.0)."  #2 s/p moderate protein calorie malnutrition patient had lost about 60 pounds in the last 2-3 months but has been gaining back his lost weight well. . Wt Readings from Last 3 Encounters:  10/13/20 (!) 311 lb 4.8 oz (141.2 kg)  04/28/20 (!) 310 lb 1.6 oz (140.7 kg)  12/09/19 (!) 307 lb 4.8 oz (139.4 kg)   #3 status post port placement on 02/23/2017 by Dr. Barry Dienes   #4 h/o Clostridium difficile colitis -. No overt issues with diarrhea currently.   #5 suspected sleep apnea.Has not had a sleep study.Wife notes significant snoring which is important with weight loss. Will have to be careful with excessive sedation. -continue prn low dose trazodone for insomnia -recommended he f/u with his PCP for consideration of sleep study-pending   PLAN: -Discussed pt labwork, 12/30/2020; blood counts stable, chemistries normal outside uncontrolled blood sugars. Plt slightly lower but stable within pt's history/baseline. -Discussed pt CT Chest Abd Pel w contrast ([id number]) on 12/30/2020; no new signs of disease. Non-specific lung nodules. No evidence of overt recurrent cancer.  -Recommended we receive a repeat scan of the lungs in six  months prior to visit. -Advised pt he is nearly 4 years out from surgery.  -No lab or clinical evidence of Gastric Adenocarcinoma recurrence at this time. Will continue watchful observation. -Recommended again for pt to f/u w PCP regarding referral for sleep study and potential sleep apnea. -Recommended pt f/u w PCP regarding blood sugars and controlling this. -Will see back in 6 months with labs and a prior repeat CT Chest/Abd. The pt is aware to contact if any acute symptoms or concerns prior to this.    FOLLOW UP: CT Chest in 5 months RTC w Dr Irene Limbo with labs in 6 months   The total time spent in the appointment was 20 minutes and more than 50% was on counseling and direct patient cares.   All of the patient's questions were answered with apparent satisfaction. The patient knows to call the clinic with any problems, questions or concerns.    Sullivan Lone MD [place] AAHIVMS [facility] Hematology/Oncology Physician [facility]  (Office):       [phone] (Work cell):  [phone] (Fax):           [phone]  I, [name], am acting as scribe for Dr. Sullivan Lone, MD.     .I have reviewed the above documentation for accuracy and completeness, and I agree with the above. Brunetta Genera MD

## 2021-01-06 ENCOUNTER — Inpatient Hospital Stay (HOSPITAL_BASED_OUTPATIENT_CLINIC_OR_DEPARTMENT_OTHER): Payer: Medicare Other | Admitting: Hematology

## 2021-01-06 DIAGNOSIS — C169 Malignant neoplasm of stomach, unspecified: Secondary | ICD-10-CM

## 2021-01-06 DIAGNOSIS — R918 Other nonspecific abnormal finding of lung field: Secondary | ICD-10-CM | POA: Diagnosis not present

## 2021-03-04 DIAGNOSIS — Z23 Encounter for immunization: Secondary | ICD-10-CM | POA: Diagnosis not present

## 2021-06-09 ENCOUNTER — Encounter: Payer: Self-pay | Admitting: Hematology

## 2021-06-15 ENCOUNTER — Ambulatory Visit (INDEPENDENT_AMBULATORY_CARE_PROVIDER_SITE_OTHER): Payer: Medicare Other | Admitting: Internal Medicine

## 2021-06-15 ENCOUNTER — Encounter: Payer: Self-pay | Admitting: Internal Medicine

## 2021-06-15 ENCOUNTER — Other Ambulatory Visit: Payer: Self-pay

## 2021-06-15 ENCOUNTER — Encounter: Payer: Self-pay | Admitting: Hematology

## 2021-06-15 VITALS — BP 136/88 | HR 82 | Temp 98.4°F | Ht 71.0 in | Wt 280.0 lb

## 2021-06-15 DIAGNOSIS — R200 Anesthesia of skin: Secondary | ICD-10-CM | POA: Diagnosis not present

## 2021-06-15 DIAGNOSIS — E785 Hyperlipidemia, unspecified: Secondary | ICD-10-CM

## 2021-06-15 DIAGNOSIS — E1159 Type 2 diabetes mellitus with other circulatory complications: Secondary | ICD-10-CM | POA: Insufficient documentation

## 2021-06-15 DIAGNOSIS — E1169 Type 2 diabetes mellitus with other specified complication: Secondary | ICD-10-CM | POA: Insufficient documentation

## 2021-06-15 DIAGNOSIS — Z794 Long term (current) use of insulin: Secondary | ICD-10-CM

## 2021-06-15 DIAGNOSIS — D649 Anemia, unspecified: Secondary | ICD-10-CM | POA: Insufficient documentation

## 2021-06-15 DIAGNOSIS — D508 Other iron deficiency anemias: Secondary | ICD-10-CM

## 2021-06-15 DIAGNOSIS — R202 Paresthesia of skin: Secondary | ICD-10-CM | POA: Diagnosis not present

## 2021-06-15 DIAGNOSIS — Z23 Encounter for immunization: Secondary | ICD-10-CM | POA: Insufficient documentation

## 2021-06-15 DIAGNOSIS — R9431 Abnormal electrocardiogram [ECG] [EKG]: Secondary | ICD-10-CM

## 2021-06-15 DIAGNOSIS — Z125 Encounter for screening for malignant neoplasm of prostate: Secondary | ICD-10-CM | POA: Insufficient documentation

## 2021-06-15 DIAGNOSIS — E118 Type 2 diabetes mellitus with unspecified complications: Secondary | ICD-10-CM

## 2021-06-15 DIAGNOSIS — E1142 Type 2 diabetes mellitus with diabetic polyneuropathy: Secondary | ICD-10-CM | POA: Diagnosis not present

## 2021-06-15 DIAGNOSIS — E119 Type 2 diabetes mellitus without complications: Secondary | ICD-10-CM

## 2021-06-15 DIAGNOSIS — I10 Essential (primary) hypertension: Secondary | ICD-10-CM | POA: Diagnosis not present

## 2021-06-15 MED ORDER — SHINGRIX 50 MCG/0.5ML IM SUSR: 0.5000 mL | Freq: Once | INTRAMUSCULAR | 1 refills | Status: AC

## 2021-06-15 MED ORDER — BOOSTRIX 5-2.5-18.5 LF-MCG/0.5 IM SUSP: 0.5000 mL | Freq: Once | INTRAMUSCULAR | 0 refills | Status: AC

## 2021-06-15 NOTE — Progress Notes (Signed)
Subjective:  Patient ID: [name], male    DOB: 1951-07-17  Age: 70 y.o. MRN: [id number]  CC: Anemia, Diabetes, Hypertension, and Hyperlipidemia  This visit occurred during the SARS-CoV-2 public health emergency.  Safety protocols were in place, including screening questions prior to the visit, additional usage of staff PPE, and extensive cleaning of exam room while observing appropriate contact time as indicated for disinfecting solutions.    HPI [name] presents for establishing.  His blood sugar was over 300 5 months ago and he says he did not know that he had diabetes mellitus.  He complains of a 41-monthhistory of numbness involving both feet with a stinging sensation.  He denies tingling or weakness.  He is active and denies any recent episodes of chest pain, shortness of breath, palpitations, or edema.  He denies polys.  History [name] a past medical history of Anemia, Cancer ([place] (dx'd 11/2016), Gastric outlet obstruction (01/2017), GERD (gastroesophageal reflux disease), Pre-diabetes, and SVT (supraventricular tachycardia) ([place].   He has a past surgical history that includes testicle removed (1982); Colonoscopy; Colonoscopy (10/11/2011); Esophagogastroduodenoscopy (N/A, 12/30/2016); Gastrectomy (N/A, 01/10/2017); Cholecystectomy (N/A, 01/10/2017); ir generic historical (01/24/2017); Portacath placement (N/A, 02/23/2017); and IR Replc Duoden/Jejuno Tube Percut W/Fluoro (03/26/2017).   His family history includes Prostate cancer in his father.He reports that he has never smoked. He has never used smokeless tobacco. He reports that he does not drink alcohol and does not use drugs.  Outpatient Medications Prior to Visit  Medication Sig Dispense Refill   ferrous sulfate 325 (65 FE) MG tablet Take 325 mg by mouth daily with breakfast.     Multiple Vitamin (MULTIVITAMIN) tablet Take 1 tablet by mouth daily.     lidocaine-prilocaine (EMLA) cream Apply to affected area once 30 g 3    omeprazole (PRILOSEC) 20 MG capsule Take 1 capsule (20 mg total) by mouth 2 (two) times daily before a meal. 60 capsule 2   ondansetron (ZOFRAN) 8 MG tablet Take 1 tablet (8 mg total) by mouth every 8 (eight) hours as needed for refractory nausea / vomiting. Start on day 3 after chemotherapy. 30 tablet 1   No facility-administered medications prior to visit.    ROS Review of Systems  Constitutional:  Negative for chills, fatigue and fever.  HENT: Negative.    Eyes:  Negative for visual disturbance.  Respiratory:  Negative for cough, chest tightness, shortness of breath and wheezing.   Cardiovascular:  Negative for chest pain, palpitations and leg swelling.  Gastrointestinal:  Negative for abdominal pain, constipation, diarrhea, nausea and vomiting.  Endocrine: Negative for polydipsia, polyphagia and polyuria.  Genitourinary: Negative.  Negative for difficulty urinating.  Musculoskeletal: Negative.  Negative for arthralgias and myalgias.  Skin: Negative.   Neurological: Negative.  Negative for dizziness and weakness.  Hematological:  Negative for adenopathy. Does not bruise/bleed easily.  Psychiatric/Behavioral: Negative.     Objective:  BP 136/88 (BP Location: Left Arm, Patient Position: Sitting, Cuff Size: Large)   Pulse 82   Temp 98.4 F (36.9 C) (Oral)   Ht _0  (1.803 m)   Wt 280 lb (127 kg)   SpO2 98%   BMI 39.05 kg/m   Physical Exam Vitals reviewed.  Constitutional:      General: He is not in acute distress.    Appearance: He is obese. He is not toxic-appearing or diaphoretic.  HENT:     Nose: Nose normal.     Mouth/Throat:     Mouth: Mucous membranes are  moist.  Eyes:     Conjunctiva/sclera: Conjunctivae normal.  Cardiovascular:     Rate and Rhythm: Normal rate and regular rhythm. Occasional Extrasystoles are present.    Heart sounds: S1 normal and S2 normal. No murmur heard.   No gallop.     Comments: EKG- SR with a PAC, 74 bpm TWI inv in inf/lat/ant  leads is not new No LVH or Q waves Pulmonary:     Effort: Pulmonary effort is normal.     Breath sounds: No stridor. No wheezing, rhonchi or rales.  Abdominal:     General: Abdomen is protuberant. Bowel sounds are normal. There is no distension.     Palpations: Abdomen is soft. There is no hepatomegaly, splenomegaly or mass.     Tenderness: There is no abdominal tenderness.  Musculoskeletal:     Cervical back: Neck supple.     Right lower leg: No edema.     Left lower leg: No edema.  Lymphadenopathy:     Cervical: No cervical adenopathy.  Skin:    General: Skin is warm and dry.     Coloration: Skin is not pale.  Neurological:     General: No focal deficit present.     Mental Status: He is alert and oriented to person, place, and time.    Lab Results  Component Value Date   WBC 5.6 06/15/2021   HGB 12.8 (L) 06/15/2021   HCT 38.8 (L) 06/15/2021   PLT 107.0 Repeated and verified X2. (L) 06/15/2021   GLUCOSE 357 (H) 06/15/2021   CHOL 178 06/15/2021   TRIG 176.0 (H) 06/15/2021   HDL 36.70 (L) 06/15/2021   LDLCALC 106 (H) 06/15/2021   ALT 26 06/15/2021   AST 22 06/15/2021   NA 136 06/15/2021   K 4.4 06/15/2021   CL 100 06/15/2021   CREATININE 1.05 06/15/2021   BUN 15 06/15/2021   CO2 26 06/15/2021   TSH 1.65 06/15/2021   INR 1.13 03/26/2017   HGBA1C 13.2 (H) 06/15/2021   MICROALBUR 6.2 (H) 06/15/2021     Assessment & Plan:   [name] was seen today for anemia, diabetes, hypertension and hyperlipidemia.  Diagnoses and all orders for this visit:  Diabetic polyneuropathy associated with type 2 diabetes mellitus ([place])- Will try to improve glycemic control.  Type II diabetes mellitus with manifestations ([place])- His A1c is 13.2%.  Will start basal/bolus insulin, metformin, and a GLP-1 agonist. -     Basic metabolic panel; Future -     Microalbumin / creatinine urine ratio; Future -     Hemoglobin A1c; Future -     HM Diabetes Foot Exam -     Ambulatory referral to  Ophthalmology -     Hemoglobin A1c -     Microalbumin / creatinine urine ratio -     Basic metabolic panel -     insulin glargine, 2 Unit Dial, (TOUJEO MAX SOLOSTAR) 300 UNIT/ML Solostar Pen; Inject 50 Units into the skin daily. -     metFORMIN (GLUCOPHAGE XR) 750 MG 24 hr tablet; Take 1 tablet (750 mg total) by mouth daily with breakfast. -     Semaglutide (RYBELSUS) 3 MG TABS; Take 3 mg by mouth daily. -     olmesartan (BENICAR) 20 MG tablet; Take 1 tablet (20 mg total) by mouth daily.  Hyperlipidemia with target LDL less than 100- I recommended that he take a statin for cardiovascular risk reduction. -     Lipid panel; Future -  TSH; Future -     Hepatic function panel; Future -     Hepatic function panel -     TSH -     Lipid panel -     rosuvastatin (CRESTOR) 10 MG tablet; Take 1 tablet (10 mg total) by mouth daily.  Numbness and tingling of both feet-this is likely related to the poorly controlled diabetes.  We will screen for secondary causes. -     Vitamin B12; Future -     Folate; Future -     Vitamin B1; Future -     Vitamin B1 -     Folate -     Vitamin B12  Other iron deficiency anemia-I will screen for vitamin deficiencies.  It also could also be the anemia of chronic disease. -     CBC with Differential/Platelet; Future -     Iron; Future -     Ferritin; Future -     Ferritin -     Iron -     CBC with Differential/Platelet  Prostate cancer screening  Primary hypertension-his blood pressure is not adequately well controlled.  We will start an ARB. -     Basic metabolic panel; Future -     TSH; Future -     Urinalysis, Routine w reflex microscopic; Future -     EKG 12-Lead -     Urinalysis, Routine w reflex microscopic -     TSH -     Basic metabolic panel -     olmesartan (BENICAR) 20 MG tablet; Take 1 tablet (20 mg total) by mouth daily.  Abnormal electrocardiogram (ECG) (EKG) -     Ambulatory referral to Cardiology  Need for shingles vaccine -      Zoster Vaccine Adjuvanted [facility]) injection; Inject 0.5 mLs into the muscle once for 1 dose.  Need for prophylactic vaccination with combined diphtheria-tetanus-pertussis (DTP) vaccine -     Tdap (BOOSTRIX) 5-2.5-18.5 LF-MCG/0.5 injection; Inject 0.5 mLs into the muscle once for 1 dose.  Insulin-requiring or dependent type II diabetes mellitus (HCC) -     insulin glargine, 2 Unit Dial, (TOUJEO MAX SOLOSTAR) 300 UNIT/ML Solostar Pen; Inject 50 Units into the skin daily. -     insulin lispro (HUMALOG KWIKPEN) 200 UNIT/ML KwikPen; Inject 15 Units into the skin 3 (three) times daily with meals. -     Amb Referral to Nutrition and Diabetic Education -     Ambulatory referral to Endocrinology -     Continuous Blood Gluc Sensor (FREESTYLE LIBRE 2 SENSOR) MISC; 1 Act by Does not apply route daily. -     Continuous Blood Gluc Receiver (FREESTYLE LIBRE 2 READER) DEVI; 1 Act by Does not apply route daily. -     Consult to [facility] -     Insulin Pen Needle 32G X 6 MM MISC; 1 Act by Does not apply route 4 (four) times daily. -     Discontinue: glucose blood (ACCU-CHEK AVIVA PLUS) test strip; 1 each by Other route 4 (four) times daily -  with meals and at bedtime. Use as instructed -     Blood Glucose Monitoring Suppl (ACCU-CHEK AVIVA PLUS) w/Device KIT; 1 Act by Does not apply route 4 (four) times daily -  with meals and at bedtime. -     Blood Glucose Calibration (ACCU-CHEK AVIVA) SOLN; 1 Act by In Vitro route daily. -     glucose blood (ACCU-CHEK AVIVA PLUS) test strip; 1 each by  Other route with breakfast, with lunch, and with evening meal. Use as instructed -     metFORMIN (GLUCOPHAGE XR) 750 MG 24 hr tablet; Take 1 tablet (750 mg total) by mouth daily with breakfast. -     Semaglutide (RYBELSUS) 3 MG TABS; Take 3 mg by mouth daily.  I have discontinued [name]'Steven lidocaine-prilocaine, ondansetron, and omeprazole. I have also changed his Accu-Chek Aviva Plus. Additionally, I am having  him start on Shingrix, Boostrix, Toujeo Max SoloStar, [employer], [employer] 2 Sensor, [employer] 2 Reader, Insulin Pen Needle, Accu-Chek Aviva Plus, Accu-Chek Aviva, metFORMIN, Rybelsus, rosuvastatin, and olmesartan. Lastly, I am having him maintain his ferrous sulfate and multivitamin.  Meds ordered this encounter  Medications   Zoster Vaccine Adjuvanted [facility]) injection    Sig: Inject 0.5 mLs into the muscle once for 1 dose.    Dispense:  0.5 mL    Refill:  1   Tdap (BOOSTRIX) 5-2.5-18.5 LF-MCG/0.5 injection    Sig: Inject 0.5 mLs into the muscle once for 1 dose.    Dispense:  0.5 mL    Refill:  0   insulin glargine, 2 Unit Dial, (TOUJEO MAX SOLOSTAR) 300 UNIT/ML Solostar Pen    Sig: Inject 50 Units into the skin daily.    Dispense:  9 mL    Refill:  1   insulin lispro (HUMALOG KWIKPEN) 200 UNIT/ML KwikPen    Sig: Inject 15 Units into the skin 3 (three) times daily with meals.    Dispense:  24 mL    Refill:  1   Continuous Blood Gluc Sensor (FREESTYLE LIBRE 2 SENSOR) MISC    Sig: 1 Act by Does not apply route daily.    Dispense:  2 each    Refill:  5   Continuous Blood Gluc Receiver (FREESTYLE LIBRE 2 READER) DEVI    Sig: 1 Act by Does not apply route daily.    Dispense:  2 each    Refill:  5   Insulin Pen Needle 32G X 6 MM MISC    Sig: 1 Act by Does not apply route 4 (four) times daily.    Dispense:  400 each    Refill:  1   DISCONTD: glucose blood (ACCU-CHEK AVIVA PLUS) test strip    Sig: 1 each by Other route 4 (four) times daily -  with meals and at bedtime. Use as instructed    Dispense:  400 strip    Refill:  1   Blood Glucose Monitoring Suppl (ACCU-CHEK AVIVA PLUS) w/Device KIT    Sig: 1 Act by Does not apply route 4 (four) times daily -  with meals and at bedtime.    Dispense:  1 kit    Refill:  3   Blood Glucose Calibration (ACCU-CHEK AVIVA) SOLN    Sig: 1 Act by In Vitro route daily.    Dispense:  1 each    Refill:  2   glucose blood  (ACCU-CHEK AVIVA PLUS) test strip    Sig: 1 each by Other route with breakfast, with lunch, and with evening meal. Use as instructed    Dispense:  300 strip    Refill:  1   metFORMIN (GLUCOPHAGE XR) 750 MG 24 hr tablet    Sig: Take 1 tablet (750 mg total) by mouth daily with breakfast.    Dispense:  90 tablet    Refill:  0   Semaglutide (RYBELSUS) 3 MG TABS    Sig: Take 3 mg by mouth  daily.    Dispense:  30 tablet    Refill:  0   rosuvastatin (CRESTOR) 10 MG tablet    Sig: Take 1 tablet (10 mg total) by mouth daily.    Dispense:  90 tablet    Refill:  1   olmesartan (BENICAR) 20 MG tablet    Sig: Take 1 tablet (20 mg total) by mouth daily.    Dispense:  90 tablet    Refill:  1      Follow-up: Return in about 3 months (around 09/15/2021).  Scarlette Calico, MD

## 2021-06-15 NOTE — Patient Instructions (Signed)

## 2021-06-16 ENCOUNTER — Other Ambulatory Visit: Payer: Self-pay | Admitting: Internal Medicine

## 2021-06-16 ENCOUNTER — Encounter: Payer: Self-pay | Admitting: Internal Medicine

## 2021-06-16 DIAGNOSIS — E119 Type 2 diabetes mellitus without complications: Secondary | ICD-10-CM

## 2021-06-16 DIAGNOSIS — Z794 Long term (current) use of insulin: Secondary | ICD-10-CM

## 2021-06-16 LAB — MICROALBUMIN / CREATININE URINE RATIO
Creatinine,U: 125.4 mg/dL
Microalb Creat Ratio: 4.9 mg/g (ref 0.0–30.0)
Microalb, Ur: 6.2 mg/dL — ABNORMAL HIGH (ref 0.0–1.9)

## 2021-06-16 LAB — CBC WITH DIFFERENTIAL/PLATELET
Basophils Absolute: 0 10*3/uL (ref 0.0–0.1)
Basophils Relative: 0.8 % (ref 0.0–3.0)
Eosinophils Absolute: 0 10*3/uL (ref 0.0–0.7)
Eosinophils Relative: 0.8 % (ref 0.0–5.0)
HCT: 38.8 % — ABNORMAL LOW (ref 39.0–52.0)
Hemoglobin: 12.8 g/dL — ABNORMAL LOW (ref 13.0–17.0)
Lymphocytes Relative: 19.4 % (ref 12.0–46.0)
Lymphs Abs: 1.1 10*3/uL (ref 0.7–4.0)
MCHC: 33 g/dL (ref 30.0–36.0)
MCV: 85.4 fl (ref 78.0–100.0)
Monocytes Absolute: 0.4 10*3/uL (ref 0.1–1.0)
Monocytes Relative: 7.5 % (ref 3.0–12.0)
Neutro Abs: 4 10*3/uL (ref 1.4–7.7)
Neutrophils Relative %: 71.5 % (ref 43.0–77.0)
Platelets: 107 10*3/uL — ABNORMAL LOW (ref 150.0–400.0)
RBC: 4.54 Mil/uL (ref 4.22–5.81)
RDW: 14.4 % (ref 11.5–15.5)
WBC: 5.6 10*3/uL (ref 4.0–10.5)

## 2021-06-16 LAB — HEPATIC FUNCTION PANEL
ALT: 26 U/L (ref 0–53)
AST: 22 U/L (ref 0–37)
Albumin: 4 g/dL (ref 3.5–5.2)
Alkaline Phosphatase: 70 U/L (ref 39–117)
Bilirubin, Direct: 0.1 mg/dL (ref 0.0–0.3)
Total Bilirubin: 0.7 mg/dL (ref 0.2–1.2)
Total Protein: 7.3 g/dL (ref 6.0–8.3)

## 2021-06-16 LAB — LIPID PANEL
Cholesterol: 178 mg/dL (ref 0–200)
HDL: 36.7 mg/dL — ABNORMAL LOW (ref >39.00)
LDL Cholesterol: 106 mg/dL — ABNORMAL HIGH (ref 0–99)
NonHDL: 140.89
Total CHOL/HDL Ratio: 5
Triglycerides: 176 mg/dL — ABNORMAL HIGH (ref 0.0–149.0)
VLDL: 35.2 mg/dL (ref 0.0–40.0)

## 2021-06-16 LAB — BASIC METABOLIC PANEL
BUN: 15 mg/dL (ref 6–23)
CO2: 26 mEq/L (ref 19–32)
Calcium: 9.5 mg/dL (ref 8.4–10.5)
Chloride: 100 mEq/L (ref 96–112)
Creatinine, Ser: 1.05 mg/dL (ref 0.40–1.50)
GFR: 71.48 mL/min (ref >60.00)
Glucose, Bld: 357 mg/dL — ABNORMAL HIGH (ref 70–99)
Potassium: 4.4 mEq/L (ref 3.5–5.1)
Sodium: 136 mEq/L (ref 135–145)

## 2021-06-16 LAB — URINALYSIS, ROUTINE W REFLEX MICROSCOPIC
Bilirubin Urine: NEGATIVE
Hgb urine dipstick: NEGATIVE
Ketones, ur: NEGATIVE
Leukocytes,Ua: NEGATIVE
Nitrite: NEGATIVE
RBC / HPF: NONE SEEN (ref 0–?)
Specific Gravity, Urine: 1.025 (ref 1.000–1.030)
Total Protein, Urine: NEGATIVE
Urine Glucose: >=1000 — AB
Urobilinogen, UA: 0.2 (ref 0.0–1.0)
pH: 5.5 (ref 5.0–8.0)

## 2021-06-16 LAB — HEMOGLOBIN A1C: Hgb A1c MFr Bld: 13.2 % — ABNORMAL HIGH (ref 4.6–6.5)

## 2021-06-16 LAB — FOLATE: Folate: 23.8 ng/mL (ref >5.9)

## 2021-06-16 LAB — FERRITIN: Ferritin: 163 ng/mL (ref 22.0–322.0)

## 2021-06-16 LAB — TSH: TSH: 1.65 u[IU]/mL (ref 0.35–5.50)

## 2021-06-16 LAB — VITAMIN B12: Vitamin B-12: 806 pg/mL (ref 211–911)

## 2021-06-16 LAB — IRON: Iron: 62 ug/dL (ref 42–165)

## 2021-06-16 MED ORDER — ACCU-CHEK AVIVA PLUS VI STRP: 1.0000 | ORAL_STRIP | Freq: Three times a day (TID) | 1 refills | Status: DC

## 2021-06-16 MED ORDER — FREESTYLE LIBRE 2 READER DEVI: 1.0000 | Freq: Every day | 5 refills | Status: DC

## 2021-06-16 MED ORDER — HUMALOG KWIKPEN 200 UNIT/ML ~~LOC~~ SOPN: 15.0000 [IU] | PEN_INJECTOR | Freq: Three times a day (TID) | SUBCUTANEOUS | 1 refills | Status: DC

## 2021-06-16 MED ORDER — ACCU-CHEK AVIVA VI SOLN: 1.0000 | Freq: Every day | 2 refills | Status: DC

## 2021-06-16 MED ORDER — ACCU-CHEK AVIVA PLUS W/DEVICE KIT: 1.0000 | PACK | Freq: Three times a day (TID) | 3 refills | Status: DC

## 2021-06-16 MED ORDER — FREESTYLE LIBRE 2 SENSOR MISC: 1.0000 | Freq: Every day | 5 refills | Status: DC

## 2021-06-16 MED ORDER — INSULIN PEN NEEDLE 32G X 6 MM MISC: 1.0000 | Freq: Four times a day (QID) | 1 refills | Status: DC

## 2021-06-16 MED ORDER — TOUJEO MAX SOLOSTAR 300 UNIT/ML ~~LOC~~ SOPN: 50.0000 [IU] | PEN_INJECTOR | Freq: Every day | SUBCUTANEOUS | 1 refills | Status: DC

## 2021-06-17 ENCOUNTER — Ambulatory Visit (HOSPITAL_COMMUNITY)
Admission: RE | Admit: 2021-06-17 | Discharge: 2021-06-17 | Disposition: A | Discharge status: Home / Self Care | Payer: Medicare Other | Source: Ambulatory Visit | Attending: Hematology | Admitting: Hematology

## 2021-06-17 ENCOUNTER — Other Ambulatory Visit: Payer: Self-pay

## 2021-06-17 DIAGNOSIS — E1142 Type 2 diabetes mellitus with diabetic polyneuropathy: Secondary | ICD-10-CM

## 2021-06-17 DIAGNOSIS — R918 Other nonspecific abnormal finding of lung field: Secondary | ICD-10-CM

## 2021-06-17 DIAGNOSIS — R911 Solitary pulmonary nodule: Secondary | ICD-10-CM | POA: Diagnosis not present

## 2021-06-17 MED ORDER — RYBELSUS 3 MG PO TABS: 3.0000 mg | ORAL_TABLET | Freq: Every day | ORAL | 0 refills | Status: AC

## 2021-06-17 MED ORDER — ROSUVASTATIN CALCIUM 10 MG PO TABS: 10.0000 mg | ORAL_TABLET | Freq: Every day | ORAL | 1 refills | Status: DC

## 2021-06-17 MED ORDER — OLMESARTAN MEDOXOMIL 20 MG PO TABS: 20.0000 mg | ORAL_TABLET | Freq: Every day | ORAL | 1 refills | Status: DC

## 2021-06-17 MED ORDER — METFORMIN HCL ER 750 MG PO TB24: 750.0000 mg | ORAL_TABLET | Freq: Every day | ORAL | 0 refills | Status: DC

## 2021-06-18 ENCOUNTER — Telehealth: Payer: Self-pay | Admitting: *Deleted

## 2021-06-18 NOTE — Chronic Care Management (AMB) (Signed)
  Chronic Care Management   Note  06/18/2021 Name: [name] MRN: [id number] DOB: May 21, 1951  [name] is a 70 y.o. year old male who is a primary care patient of Janith Lima, MD. I reached out to [name] by phone today in response to a referral sent by [name] PCP, Dr.Jones      [name] was given information about Chronic Care Management services today including:  CCM service includes personalized support from designated clinical staff supervised by his physician, including individualized plan of care and coordination with other care providers 24/7 contact phone numbers for assistance for urgent and routine care needs. Service will only be billed when office clinical staff spend 20 minutes or more in a month to coordinate care. Only one practitioner may furnish and bill the service in a calendar month. The patient may stop CCM services at any time (effective at the end of the month) by phone call to the office staff. The patient will be responsible for cost sharing (co-pay) of up to 20% of the service fee (after annual deductible is met).  Patient did not agree to enrollment in care management services and does not wish to consider at this time.  Follow up plan: Patient declines engagement by the care management team. Appropriate care team members and provider have been notified via electronic communication. The care management team is available to follow up with the patient after provider conversation with the patient regarding recommendation for care management engagement and subsequent re-referral to the care management team.   [facility]  Direct Dial: [phone]

## 2021-06-23 LAB — VITAMIN B1: Vitamin B1 (Thiamine): 14 nmol/L (ref 8–30)

## 2021-07-06 ENCOUNTER — Other Ambulatory Visit: Payer: Self-pay

## 2021-07-06 ENCOUNTER — Inpatient Hospital Stay: Payer: Medicare Other | Attending: Hematology

## 2021-07-06 ENCOUNTER — Inpatient Hospital Stay (HOSPITAL_BASED_OUTPATIENT_CLINIC_OR_DEPARTMENT_OTHER): Payer: Medicare Other | Admitting: Hematology

## 2021-07-06 VITALS — BP 136/68 | HR 54 | Temp 97.8°F | Resp 18 | Wt 289.4 lb

## 2021-07-06 DIAGNOSIS — C169 Malignant neoplasm of stomach, unspecified: Secondary | ICD-10-CM

## 2021-07-06 DIAGNOSIS — Z8509 Personal history of malignant neoplasm of other digestive organs: Secondary | ICD-10-CM | POA: Diagnosis not present

## 2021-07-06 DIAGNOSIS — R918 Other nonspecific abnormal finding of lung field: Secondary | ICD-10-CM

## 2021-07-06 LAB — CBC WITH DIFFERENTIAL/PLATELET
Abs Immature Granulocytes: 0.01 10*3/uL (ref 0.00–0.07)
Basophils Absolute: 0 10*3/uL (ref 0.0–0.1)
Basophils Relative: 1 %
Eosinophils Absolute: 0 10*3/uL (ref 0.0–0.5)
Eosinophils Relative: 1 %
HCT: 35.7 % — ABNORMAL LOW (ref 39.0–52.0)
Hemoglobin: 11.7 g/dL — ABNORMAL LOW (ref 13.0–17.0)
Immature Granulocytes: 0 %
Lymphocytes Relative: 23 %
Lymphs Abs: 1 10*3/uL (ref 0.7–4.0)
MCH: 28.3 pg (ref 26.0–34.0)
MCHC: 32.8 g/dL (ref 30.0–36.0)
MCV: 86.4 fL (ref 80.0–100.0)
Monocytes Absolute: 0.3 10*3/uL (ref 0.1–1.0)
Monocytes Relative: 8 %
Neutro Abs: 2.9 10*3/uL (ref 1.7–7.7)
Neutrophils Relative %: 67 %
Platelets: 162 10*3/uL (ref 150–400)
RBC: 4.13 MIL/uL — ABNORMAL LOW (ref 4.22–5.81)
RDW: 13.7 % (ref 11.5–15.5)
WBC: 4.2 10*3/uL (ref 4.0–10.5)
nRBC: 0 % (ref 0.0–0.2)

## 2021-07-06 LAB — CMP (CANCER CENTER ONLY)
ALT: 24 U/L (ref 0–44)
AST: 23 U/L (ref 15–41)
Albumin: 3.7 g/dL (ref 3.5–5.0)
Alkaline Phosphatase: 59 U/L (ref 38–126)
Anion gap: 9 (ref 5–15)
BUN: 16 mg/dL (ref 8–23)
CO2: 25 mmol/L (ref 22–32)
Calcium: 9.6 mg/dL (ref 8.9–10.3)
Chloride: 106 mmol/L (ref 98–111)
Creatinine: 1.13 mg/dL (ref 0.61–1.24)
GFR, Estimated: >60 mL/min (ref >60)
Glucose, Bld: 144 mg/dL — ABNORMAL HIGH (ref 70–99)
Potassium: 4.3 mmol/L (ref 3.5–5.1)
Sodium: 140 mmol/L (ref 135–145)
Total Bilirubin: 0.7 mg/dL (ref 0.3–1.2)
Total Protein: 7.2 g/dL (ref 6.5–8.1)

## 2021-07-09 ENCOUNTER — Telehealth: Payer: Self-pay | Admitting: Hematology

## 2021-07-09 NOTE — Telephone Encounter (Signed)
Scheduled follow-up appointments per 8/23 los. Patient is aware. 

## 2021-07-12 ENCOUNTER — Encounter: Payer: Self-pay | Admitting: Hematology

## 2021-07-12 NOTE — Progress Notes (Signed)
HEMATOLOGY/ONCOLOGY CLINIC NOTE  Date of Service: .07/06/2021    Patient Care Team: Janith Lima, MD as PCP - General (Internal Medicine) [name] (Surgery)    CHIEF COMPLAINTS/PURPOSE OF CONSULTATION:   F/u for continued management of gastric adenocarcinoma  HISTORY OF PRESENTING ILLNESS:   [name] is a wonderful 70 y.o. male is here for continued evaluation and management of recently diagnosed pT4a, pN3b, pM1 Gastric Adenocarcinoma.   Patient was previously seen in the hospital on 01/19/2017 with a diagnosis of gastric adenocarcinoma in the gastric pylorus causing significant intrinsic stenosis . Patient had presented to primary care physician with dyspeptic symptoms, partial gastric outlet obstruction and weight loss of 60 pounds. Patient has had a distal gastrectomy, cholecystectomy with jejunal feeding tube placement by Dr. Barry Dienes on 01/10/2017 for gastric obstruction secondary to adenocarcinoma of the gastric pylorus. He was noted to have invaded into the pancreas and a bit of the anterior pancreatic head was resected en bloc with the tumor.   Pathology showed  " INVASIVE MODERATELY TO POORLY DIFFERENTIATED ADENOCARCINOMA, SPANNING 4 CM IN GREATEST DIMENSION. TUMOR INVADES THROUGH STOMACH WALL TO INVOLVE SEROSAL SURFACE.  LYMPH/VASCULAR INVASION IS IDENTIFIED.  MARGINS ARE NEGATIVE.- FIFTEEN OF THIRTY NINE LYMPH NODES POSITIVE FOR METASTATIC ADENOCARCINOMA (15/39). EXTRACAPSULAR EXTENSION IS IDENTIFIED. 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  Patient had a prolonged hospitalization post surgery  due to significant NG tube output. Needed epidural and PCA for pain control. It took a while for him to be able to tolerate progression of 2 feeding. He had an abdominal drain due to a small collection near his pancreatic head. Was treated with Augmentin for this. Patient was in the  hospital from 01/10/2017 and was eventually discharged on 01/27/2017.  Patient postponed several attempts at posthospitalization follow-up. He has been recovering at home and has been optimizing his tube feeding. Patient was recently followed up by Dr. Barry Dienes and has had a port placement on 02/23/2017 and preparation for possible chemotherapy.  He notes that his tube feeding is up to 40 mL per hour for 12 hours and has targeted is 16 ml per hour. He was recently admitted to [facility] in [place] for a C. difficile colitis that caused a severe diarrhea. he is currently on oral vancomycin has completed about 5 days of treatment and is also on probiotics.  CURRENT THERAPY: Active surveillance  PREVIOUS THERAPY:  Concurrent Chemoradiation with Xeloda. He started 07/18/17 and completed 08/25/17  INTERVAL HISTORY   [name] is here for f/u for his gastric adenocarcinoma. The patient's last visit with [name] was on . The pt reports that he is doing well overall.  The pt reports no new symptoms or concerns.  He has been staying very physically [place] and notes no acute new symptoms. Notes that at times his blood sugar levels have been more elevated and he is following with his primary care physician for managing these.  Lab results 07/06/2021 of CBC w/diff shows hemoglobin of 11.7 normal WBC count and normal platelet count of 162k. CMP within normal limits other than a blood sugar of 144.  CT chest without contrast done on 06/17/2021- Tiny nodules seen in the upper lobes on 12/30/2020 are not appreciated. No suspicious pulmonary nodules. No pleural fluid. Airway is unremarkable.  No evidence of metastatic disease. Hepatic steatosis.  Question cirrhosis. Coronary artery calcification.  On review of systems, pt denies new symptoms  any other acute.  Fevers no chills no night sweats.  No abdominal pain.  No change in bowel habits.  No evidence of GI bleeding.  No sudden unexpected weight  loss.  MEDICAL HISTORY:  Past Medical History:  Diagnosis Date   Anemia    Cancer ([place]) dx'd 11/2016   Gastric Adenocarcinoma   Gastric outlet obstruction 01/2017   GERD (gastroesophageal reflux disease)    Pre-diabetes    denies   SVT (supraventricular tachycardia) ([place])    during Admission and Surgery- 01/2017    SURGICAL HISTORY: Past Surgical History:  Procedure Laterality Date   CHOLECYSTECTOMY N/A 01/10/2017   Procedure: CHOLECYSTECTOMY;  Surgeon: Stark Klein, MD;  Location: [place];  Service: General;  Laterality: N/A;   COLONOSCOPY     COLONOSCOPY  10/11/2011   Procedure: COLONOSCOPY;  Surgeon: Daneil Dolin, MD;  [facility];  Service: Endoscopy;  Laterality: N/A;  10:30 AM   ESOPHAGOGASTRODUODENOSCOPY N/A 12/30/2016   Procedure: ESOPHAGOGASTRODUODENOSCOPY (EGD);  Surgeon: Carol Ada, MD;  Location: [facility];  Service: Endoscopy;  Laterality: N/A;   GASTRECTOMY N/A 01/10/2017   Procedure: DISTAL GASTRECTOMY, JEJUNUM  FEEDING TUBE PLACEMENT;  Surgeon: Stark Klein, MD;  Location: [place];  Service: General;  Laterality: N/A;   IR GENERIC HISTORICAL  01/24/2017   IR [place] DUODEN/JEJUNO TUBE PERCUT W/FLUORO 01/24/2017 Aletta Edouard, MD [facility]-INTERV RAD   IR [facility] DUODEN/JEJUNO TUBE PERCUT W/FLUORO  03/26/2017   PORTACATH PLACEMENT N/A 02/23/2017   Procedure: INSERTION PORT-A-CATH;  Surgeon: Stark Klein, MD;  Location: [place];  Service: General;  Laterality: N/A;   testicle removed  1982    SOCIAL HISTORY: Social History   Socioeconomic History   Marital status: Married    Spouse name: Not on file   Number of children: Not on file   Years of education: Not on file   Highest education level: Not on file  Occupational History   Not on file  Tobacco Use   Smoking status: Never   Smokeless tobacco: Never  Vaping Use   Vaping Use: Unknown  Substance and Sexual Activity   Alcohol use: No    Comment: occ beer or wine but none in last 6 months    Drug use: No    Sexual activity: Not on file  Other Topics Concern   Not on file  Social History Narrative   Not on file   Social Determinants of Health   Financial Resource Strain: Not on file  Food Insecurity: Not on file  Transportation Needs: Not on file  Physical Activity: Not on file  Stress: Not on file  Social Connections: Not on file  Intimate Partner Violence: Not on file    FAMILY HISTORY: Family History  Problem Relation Age of Onset   Prostate cancer Father    Colon cancer Neg Hx     ALLERGIES:  is allergic to no known allergies.  MEDICATIONS:  Current Outpatient Medications  Medication Sig Dispense Refill   Blood Glucose Calibration (ACCU-CHEK AVIVA) SOLN 1 Act by In Vitro route daily. 1 each 2   Blood Glucose Monitoring Suppl (ACCU-CHEK AVIVA PLUS) w/Device KIT 1 Act by Does not apply route 4 (four) times daily -  with meals and at bedtime. 1 kit 3   Continuous Blood Gluc Receiver (FREESTYLE LIBRE 2 READER) DEVI 1 Act by Does not apply route daily. 2 each 5   Continuous Blood Gluc Sensor (FREESTYLE LIBRE 2 SENSOR) MISC 1 Act by Does not apply route  daily. 2 each 5   ferrous sulfate 325 (65 FE) MG tablet Take 325 mg by mouth daily with breakfast.     glucose blood (ACCU-CHEK AVIVA PLUS) test strip 1 each by Other route with breakfast, with lunch, and with evening meal. Use as instructed 300 strip 1   insulin glargine, 2 Unit Dial, (TOUJEO MAX SOLOSTAR) 300 UNIT/ML Solostar Pen Inject 50 Units into the skin daily. 9 mL 1   insulin lispro (HUMALOG KWIKPEN) 200 UNIT/ML KwikPen Inject 15 Units into the skin 3 (three) times daily with meals. 24 mL 1   Insulin Pen Needle 32G X 6 MM MISC 1 Act by Does not apply route 4 (four) times daily. 400 each 1   metFORMIN (GLUCOPHAGE XR) 750 MG 24 hr tablet Take 1 tablet (750 mg total) by mouth daily with breakfast. 90 tablet 0   Multiple Vitamin (MULTIVITAMIN) tablet Take 1 tablet by mouth daily.     olmesartan (BENICAR) 20 MG tablet Take 1  tablet (20 mg total) by mouth daily. 90 tablet 1   rosuvastatin (CRESTOR) 10 MG tablet Take 1 tablet (10 mg total) by mouth daily. 90 tablet 1   Semaglutide (RYBELSUS) 3 MG TABS Take 3 mg by mouth daily. 30 tablet 0   No current facility-administered medications for this visit.    REVIEW OF SYSTEMS:   10 Point review of Systems was done is negative except as noted above.  PHYSICAL EXAMINATION:  .BP 136/68 (BP Location: Left Arm, Patient Position: Sitting)   Pulse (!) 54   Temp 97.8 F (36.6 C) (Oral)   Resp 18   Wt 289 lb 6 oz (131.3 kg)   SpO2 100%   BMI 40.36 kg/m  NAD . GENERAL:alert, in no acute distress and comfortable SKIN: no acute rashes, no significant lesions EYES: conjunctiva are pink and non-injected, sclera anicteric OROPHARYNX: MMM, no exudates, no oropharyngeal erythema or ulceration NECK: supple, no JVD LYMPH:  no palpable lymphadenopathy in the cervical, axillary or inguinal regions LUNGS: clear to auscultation b/l with normal respiratory effort HEART: regular rate & rhythm ABDOMEN:  normoactive bowel sounds , non tender, not distended. Extremity: no pedal edema PSYCH: alert & oriented x 3 with fluent speech NEURO: no focal motor/sensory deficits   LABORATORY DATA:  I have reviewed the data as listed  CBC Latest Ref Rng & Units 07/06/2021 06/15/2021 12/30/2020  WBC 4.0 - 10.5 K/uL 4.2 5.6 4.9  Hemoglobin 13.0 - 17.0 g/dL 11.7(L) 12.8(L) 11.5(L)  Hematocrit 39.0 - 52.0 % 35.7(L) 38.8(L) 34.2(L)  Platelets 150 - 400 K/uL 162 107.0 Repeated and verified X2.(L) 136(L)    CMP Latest Ref Rng & Units 07/06/2021 06/15/2021 12/30/2020  Glucose 70 - 99 mg/dL 144(H) 357(H) 314(H)  BUN 8 - 23 mg/dL _0 Creatinine 0.61 - 1.24 mg/dL 1.13 1.05 1.22  Sodium 135 - 145 mmol/L 140 136 135  Potassium 3.5 - 5.1 mmol/L 4.3 4.4 4.1  Chloride 98 - 111 mmol/L 106 100 103  CO2 22 - 32 mmol/L _1 Calcium 8.9 - 10.3 mg/dL 9.6 9.5 8.8(L)  Total Protein 6.5 - 8.1 g/dL  7.2 7.3 7.1  Total Bilirubin 0.3 - 1.2 mg/dL 0.7 0.7 0.5  Alkaline Phos 38 - 126 U/L 59 70 73  AST 15 - 41 U/L _2 ALT 0 - 44 U/L _3 RADIOGRAPHIC STUDIES: I have personally reviewed the radiological images as listed and agreed with  the findings in the report. CT Chest Wo Contrast  Result Date: 06/18/2021 CLINICAL DATA:  Lung nodule.  Gastric cancer. EXAM: CT CHEST WITHOUT CONTRAST TECHNIQUE: Multidetector CT imaging of the chest was performed following the standard protocol without IV contrast. COMPARISON:  12/30/2020 and 07/13/2018. FINDINGS: Cardiovascular: Left IJ Port-A-Cath terminates in the SVC. Coronary artery calcification. Heart size normal. No pericardial effusion. Mediastinum/Nodes: No pathologically enlarged mediastinal or axillary lymph nodes. Hilar regions are difficult to definitively evaluate without IV contrast. Esophagus is grossly unremarkable. Lungs/Pleura: Tiny nodules seen in the upper lobes on 12/30/2020 are not appreciated. No suspicious pulmonary nodules. No pleural fluid. Airway is unremarkable. Upper Abdomen: Liver is decreased in attenuation diffusely in the margin may be slightly irregular. Visualized portions of the adrenal glands, kidneys, spleen and pancreas are unremarkable. Postoperative changes in the distal stomach, partially imaged. Musculoskeletal: Degenerative changes in the spine. Flowing anterior osteophytosis in the thoracic spine. No worrisome lytic or sclerotic lesions. IMPRESSION: 1. No evidence of metastatic disease. 2. Hepatic steatosis.  Question cirrhosis. 3. Coronary artery calcification. Electronically Signed   By: Lorin Picket M.D.   On: 06/18/2021 13:58     ASSESSMENT & PLAN:   70 y.o.  African-American male with   1) Resected Stage IV pT4a, pN3b, pM1 invasive moderately-poorly Gastric Adenocarcinoma . Her 2 neg by FISH LVI + margins neg 15/39 LN +ve, Extracapsular invasion +, some involvement of pancreatic capsule there  pM1 Presented with severe pyloric stenosis with ulceration. S/p 60 pound weight loss- now resolved. S/p distal gastrectomy, cholecystectomy and en bloc dissection off the small part of the pancreatic head  with jejunal feeding tube placement. Initial CT abdomen and pelvis did not show any local regional lymphadenopathy. Rpt CT chest/abd/pelvis from 03/22/2017 - no overt evidence of residual disease or metastatic disease. Post-surgical peripancreatic fluid collection resolving. CT chest/abd/pelvis 06/21/2017 after 6 cycles of FOLFOX -  No evidence of residual or metastatic carcinoma within the chest, abdomen, or pelvis -Started Chemoradiation on 07/18/17 with Xeloda (813m/m2 twice daily Monday through Friday while on radiation) and completed treatment on 08/24/2017. CT chest/abd/pelvis (11/16/2017) - show radiation related changes but no evidence of tumor progression at this time.  07/13/18 CT C/A/P revealed No evidence of metastatic disease in the chest, abdomen or pelvis. Previously described mildly enlarged high left para-aortic retroperitoneal node is decreased and now normal in size. Previously described peripancreatic fat stranding is decreased and probably treatment related.   03/11/19 CT A/P revealed "Status post distal gastrectomy, without findings to suggest local recurrence of disease or definite metastatic disease in the abdomen or pelvis. 2. Colonic diverticulosis without evidence of acute diverticulitis at this time. 3. Incidental findings, as above, similar to prior studies."  09/30/2019 CT C/A/P ([phone])([phone]) revealed "1. Status post distal gastrectomy with gastrojejunostomy. No specific findings identified to suggest local tumor recurrence or metastatic disease. 2. Aortic atherosclerosis. Coronary artery calcifications noted. Aortic Atherosclerosis (ICD10-I70.0)."  #2 status post port placement on 02/23/2017 by Dr. BBarry Dienes  #3 h/o Clostridium difficile colitis -. No overt issues  with diarrhea currently.   #4 suspected sleep apnea. Has not had a sleep study. Wife notes significant snoring which is important with weight loss.  Will have to be careful with excessive sedation. -continue prn low dose trazodone for insomnia -recommended he f/u with his PCP for consideration of sleep study-pending   PLAN: -Discussed pt labwork, 07/06/2021 blood counts stable, chemistries normal outside mildly elevated blood sugars.  -Discussed pt CT Chest-previously noted lung  nodules less appreciable.  Overt new lung nodules.  No evidence of metastatic disease in the chest.. -No lab or clinical evidence of Gastric Adenocarcinoma recurrence at this time. Will continue watchful observation. -Recommended again for pt to f/u w PCP regarding referral for sleep study and potential sleep apnea. -Recommended pt f/u w PCP regarding blood sugars and controlling this. -Will see back in 6 months with labs and a prior repeat CT Chest/Abd. The pt is aware to contact if any acute symptoms or concerns prior to this.    FOLLOW UP: CT chest/abd/pelvis in 23 weeks Portflush in 2 months  Portflush in 24 weeks with labs and MD visit   . The total time spent in the appointment was 20 minutes and more than 50% was on counseling and direct patient cares.    All of the patient's questions were answered with apparent satisfaction. The patient knows to call the clinic with any problems, questions or concerns.    Sullivan Lone MD [place] AAHIVMS [facility] [place] Hematology/Oncology Physician [facility]  (Office):       [phone] (Work cell):  [phone] (Fax):           [phone]

## 2021-07-28 ENCOUNTER — Encounter: Payer: Self-pay | Admitting: Hematology

## 2021-08-08 ENCOUNTER — Encounter: Payer: Self-pay | Admitting: Internal Medicine

## 2021-08-08 DIAGNOSIS — E785 Hyperlipidemia, unspecified: Secondary | ICD-10-CM

## 2021-08-08 DIAGNOSIS — E118 Type 2 diabetes mellitus with unspecified complications: Secondary | ICD-10-CM

## 2021-08-08 DIAGNOSIS — I10 Essential (primary) hypertension: Secondary | ICD-10-CM

## 2021-08-08 DIAGNOSIS — Z794 Long term (current) use of insulin: Secondary | ICD-10-CM

## 2021-08-09 MED ORDER — METFORMIN HCL ER 750 MG PO TB24
750.0000 mg | ORAL_TABLET | Freq: Every day | ORAL | 0 refills | Status: DC
Start: 2021-08-09 — End: 2021-11-01

## 2021-08-09 MED ORDER — ROSUVASTATIN CALCIUM 10 MG PO TABS
10.0000 mg | ORAL_TABLET | Freq: Every day | ORAL | 1 refills | Status: DC
Start: 2021-08-09 — End: 2022-02-08

## 2021-08-09 MED ORDER — OLMESARTAN MEDOXOMIL 20 MG PO TABS: 20.0000 mg | ORAL_TABLET | Freq: Every day | ORAL | 1 refills | Status: DC

## 2021-08-31 ENCOUNTER — Other Ambulatory Visit: Payer: Self-pay

## 2021-08-31 ENCOUNTER — Inpatient Hospital Stay: Payer: Medicare Other | Attending: Hematology

## 2021-08-31 DIAGNOSIS — Z8509 Personal history of malignant neoplasm of other digestive organs: Secondary | ICD-10-CM | POA: Insufficient documentation

## 2021-08-31 DIAGNOSIS — C160 Malignant neoplasm of cardia: Secondary | ICD-10-CM

## 2021-08-31 DIAGNOSIS — Z452 Encounter for adjustment and management of vascular access device: Secondary | ICD-10-CM | POA: Diagnosis not present

## 2021-08-31 DIAGNOSIS — Z95828 Presence of other vascular implants and grafts: Secondary | ICD-10-CM

## 2021-08-31 DIAGNOSIS — Z7189 Other specified counseling: Secondary | ICD-10-CM

## 2021-08-31 MED ORDER — SODIUM CHLORIDE 0.9% FLUSH
10.0000 mL | INTRAVENOUS | Status: DC | PRN
  Administered 2021-08-31: 10 mL via INTRAVENOUS

## 2021-08-31 MED ORDER — HEPARIN SOD (PORK) LOCK FLUSH 100 UNIT/ML IV SOLN
500.0000 [IU] | Freq: Once | INTRAVENOUS | Status: AC | PRN
  Administered 2021-08-31: 500 [IU] via INTRAVENOUS

## 2021-10-01 DIAGNOSIS — Z23 Encounter for immunization: Secondary | ICD-10-CM | POA: Diagnosis not present

## 2021-11-01 ENCOUNTER — Encounter: Payer: Self-pay | Admitting: Emergency Medicine

## 2021-11-01 ENCOUNTER — Ambulatory Visit (INDEPENDENT_AMBULATORY_CARE_PROVIDER_SITE_OTHER): Payer: Medicare Other | Admitting: Emergency Medicine

## 2021-11-01 ENCOUNTER — Other Ambulatory Visit: Payer: Self-pay

## 2021-11-01 VITALS — BP 136/82 | HR 67 | Temp 98.1°F | Ht 71.0 in | Wt 289.0 lb

## 2021-11-01 DIAGNOSIS — E1159 Type 2 diabetes mellitus with other circulatory complications: Secondary | ICD-10-CM | POA: Diagnosis not present

## 2021-11-01 DIAGNOSIS — I152 Hypertension secondary to endocrine disorders: Secondary | ICD-10-CM

## 2021-11-01 DIAGNOSIS — E118 Type 2 diabetes mellitus with unspecified complications: Secondary | ICD-10-CM

## 2021-11-01 DIAGNOSIS — E1165 Type 2 diabetes mellitus with hyperglycemia: Secondary | ICD-10-CM

## 2021-11-01 DIAGNOSIS — E785 Hyperlipidemia, unspecified: Secondary | ICD-10-CM | POA: Diagnosis not present

## 2021-11-01 DIAGNOSIS — C160 Malignant neoplasm of cardia: Secondary | ICD-10-CM

## 2021-11-01 DIAGNOSIS — I10 Essential (primary) hypertension: Secondary | ICD-10-CM | POA: Diagnosis not present

## 2021-11-01 DIAGNOSIS — Z1211 Encounter for screening for malignant neoplasm of colon: Secondary | ICD-10-CM

## 2021-11-01 LAB — COMPREHENSIVE METABOLIC PANEL
ALT: 17 U/L (ref 0–53)
AST: 24 U/L (ref 0–37)
Albumin: 3.9 g/dL (ref 3.5–5.2)
Alkaline Phosphatase: 52 U/L (ref 39–117)
BUN: 13 mg/dL (ref 6–23)
CO2: 29 mEq/L (ref 19–32)
Calcium: 9.5 mg/dL (ref 8.4–10.5)
Chloride: 102 mEq/L (ref 96–112)
Creatinine, Ser: 0.96 mg/dL (ref 0.40–1.50)
GFR: 79.38 mL/min (ref >60.00)
Glucose, Bld: 162 mg/dL — ABNORMAL HIGH (ref 70–99)
Potassium: 4.4 mEq/L (ref 3.5–5.1)
Sodium: 136 mEq/L (ref 135–145)
Total Bilirubin: 0.5 mg/dL (ref 0.2–1.2)
Total Protein: 7.3 g/dL (ref 6.0–8.3)

## 2021-11-01 LAB — POCT GLYCOSYLATED HEMOGLOBIN (HGB A1C): Hemoglobin A1C: 6.7 % — AB (ref 4.0–5.6)

## 2021-11-01 LAB — HEMOGLOBIN A1C: Hgb A1c MFr Bld: 7.2 % — ABNORMAL HIGH (ref 4.6–6.5)

## 2021-11-01 LAB — LIPID PANEL
Cholesterol: 118 mg/dL (ref 0–200)
HDL: 47.1 mg/dL (ref >39.00)
LDL Cholesterol: 41 mg/dL (ref 0–99)
NonHDL: 70.77
Total CHOL/HDL Ratio: 3
Triglycerides: 150 mg/dL — ABNORMAL HIGH (ref 0.0–149.0)
VLDL: 30 mg/dL (ref 0.0–40.0)

## 2021-11-01 MED ORDER — TRULICITY 0.75 MG/0.5ML ~~LOC~~ SOAJ: 0.7500 mg | SUBCUTANEOUS | 6 refills | Status: DC

## 2021-11-01 MED ORDER — KETOCONAZOLE 2 % EX CREA: 1.0000 "application " | TOPICAL_CREAM | Freq: Every day | CUTANEOUS | 5 refills | Status: DC

## 2021-11-01 NOTE — Assessment & Plan Note (Signed)
In remission.  Stable.  No problems.

## 2021-11-01 NOTE — Progress Notes (Signed)
[name] 70 y.o.   Chief Complaint  Patient presents with   Diabetes    4 month f/u    HISTORY OF PRESENT ILLNESS: This is a 70 y.o. male here for diabetes follow-up Presently on metformin 750 mg daily but wants to switch to something else Not taking insulin all Rybelsus as prescribed last August. History of hypertension on olmesartan History of dyslipidemia on rosuvastatin History of stomach cancer on remission Lab Results  Component Value Date   HGBA1C 13.2 (H) 06/15/2021     Diabetes Pertinent negatives for hypoglycemia include no dizziness or headaches. Pertinent negatives for diabetes include no chest pain.    Prior to Admission medications   Medication Sig Start Date End Date Taking? Authorizing Provider  Blood Glucose Calibration (ACCU-CHEK AVIVA) SOLN 1 Act by In Vitro route daily. 06/16/21  Yes Janith Lima, MD  Blood Glucose Monitoring Suppl (ACCU-CHEK AVIVA PLUS) w/Device KIT 1 Act by Does not apply route 4 (four) times daily -  with meals and at bedtime. 06/16/21  Yes Janith Lima, MD  Continuous Blood Gluc Receiver (FREESTYLE LIBRE 2 READER) DEVI 1 Act by Does not apply route daily. 06/16/21  Yes Janith Lima, MD  Continuous Blood Gluc Sensor (FREESTYLE LIBRE 2 SENSOR) MISC 1 Act by Does not apply route daily. 06/16/21  Yes Janith Lima, MD  ferrous sulfate 325 (65 FE) MG tablet Take 325 mg by mouth daily with breakfast.   Yes [provider]  glucose blood (ACCU-CHEK AVIVA PLUS) test strip 1 each by Other route with breakfast, with lunch, and with evening meal. Use as instructed 06/16/21  Yes Janith Lima, MD  insulin glargine, 2 Unit Dial, (TOUJEO MAX SOLOSTAR) 300 UNIT/ML Solostar Pen Inject 50 Units into the skin daily. 06/16/21  Yes Janith Lima, MD  insulin lispro (HUMALOG KWIKPEN) 200 UNIT/ML KwikPen Inject 15 Units into the skin 3 (three) times daily with meals. 06/16/21  Yes Janith Lima, MD  Insulin Pen Needle 32G X 6 MM MISC 1 Act by  Does not apply route 4 (four) times daily. 06/16/21  Yes Janith Lima, MD  metFORMIN (GLUCOPHAGE XR) 750 MG 24 hr tablet Take 1 tablet (750 mg total) by mouth daily with breakfast. 08/09/21  Yes Janith Lima, MD  Multiple Vitamin (MULTIVITAMIN) tablet Take 1 tablet by mouth daily.   Yes [provider]  olmesartan (BENICAR) 20 MG tablet Take 1 tablet (20 mg total) by mouth daily. 08/09/21  Yes Janith Lima, MD  rosuvastatin (CRESTOR) 10 MG tablet Take 1 tablet (10 mg total) by mouth daily. 08/09/21  Yes Janith Lima, MD    Allergies  Allergen Reactions   No Known Allergies     Patient Active Problem List   Diagnosis Date Noted   Diabetic polyneuropathy associated with type 2 diabetes mellitus ([place]) 06/15/2021   Hyperlipidemia with target LDL less than 100 06/15/2021   Type II diabetes mellitus with manifestations ([place]) 06/15/2021   Numbness and tingling of both feet 06/15/2021   Absolute anemia 06/15/2021   Prostate cancer screening 06/15/2021   Primary hypertension 06/15/2021   Abnormal electrocardiogram (ECG) (EKG) 06/15/2021   Need for shingles vaccine 06/15/2021   Need for prophylactic vaccination with combined diphtheria-tetanus-pertussis (DTP) vaccine 06/15/2021   Counseling regarding advance care planning and goals of care 08/13/2018   Port catheter in place 08/03/2017   GERD (gastroesophageal reflux disease) 03/25/2017   Anemia 03/25/2017   Counseling regarding advanced  directives and goals of care 03/17/2017   Counseling regarding advanced care planning and goals of care 03/15/2017   NSVT (nonsustained ventricular tachycardia)    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    Past Medical History:  Diagnosis Date   Anemia    Cancer ([place]) dx'd 11/2016   Gastric Adenocarcinoma   Gastric outlet obstruction 01/2017   GERD (gastroesophageal reflux disease)    Pre-diabetes    denies   SVT (supraventricular tachycardia) ([place])    during  Admission and Surgery- 01/2017    Past Surgical History:  Procedure Laterality Date   CHOLECYSTECTOMY N/A 01/10/2017   Procedure: CHOLECYSTECTOMY;  Surgeon: Stark Klein, MD;  Location: [facility];  Service: General;  Laterality: N/A;   COLONOSCOPY     COLONOSCOPY  10/11/2011   Procedure: COLONOSCOPY;  Surgeon: Daneil Dolin, MD;  [facility];  Service: Endoscopy;  Laterality: N/A;  10:30 AM   ESOPHAGOGASTRODUODENOSCOPY N/A 12/30/2016   Procedure: ESOPHAGOGASTRODUODENOSCOPY (EGD);  Surgeon: Carol Ada, MD;  Location: [facility];  Service: Endoscopy;  Laterality: N/A;   GASTRECTOMY N/A 01/10/2017   Procedure: DISTAL GASTRECTOMY, JEJUNUM  FEEDING TUBE PLACEMENT;  Surgeon: Stark Klein, MD;  Location: [facility];  Service: General;  Laterality: N/A;   IR GENERIC HISTORICAL  01/24/2017   IR [place] DUODEN/JEJUNO TUBE PERCUT W/FLUORO 01/24/2017 Aletta Edouard, MD [facility]   IR [facility] DUODEN/JEJUNO TUBE PERCUT W/FLUORO  03/26/2017   PORTACATH PLACEMENT N/A 02/23/2017   Procedure: INSERTION PORT-A-CATH;  Surgeon: Stark Klein, MD;  Location: [facility];  Service: General;  Laterality: N/A;   testicle removed  1982    Social History   Socioeconomic History   Marital status: Married    Spouse name: Not on file   Number of children: Not on file   Years of education: Not on file   Highest education level: Not on file  Occupational History   Not on file  Tobacco Use   Smoking status: Never   Smokeless tobacco: Never  Vaping Use   Vaping Use: Unknown  Substance and Sexual Activity   Alcohol use: No    Comment: occ beer or wine but none in last 6 months    Drug use: No   Sexual activity: Not on file  Other Topics Concern   Not on file  Social History Narrative   Not on file   Social Determinants of Health   Financial Resource Strain: Not on file  Food Insecurity: Not on file  Transportation Needs: Not on file  Physical Activity: Not on file  Stress: Not on file  Social  Connections: Not on file  Intimate Partner Violence: Not on file    Family History  Problem Relation Age of Onset   Prostate cancer Father    Colon cancer Neg Hx      Review of Systems  Constitutional: Negative.  Negative for chills and fever.  HENT: Negative.  Negative for congestion and sore throat.   Respiratory: Negative.  Negative for cough and shortness of breath.   Cardiovascular: Negative.  Negative for chest pain and palpitations.  Gastrointestinal: Negative.  Negative for abdominal pain, diarrhea, nausea and vomiting.  Genitourinary: Negative.  Negative for dysuria and hematuria.  Musculoskeletal: Negative.   Skin: Negative.  Negative for rash.  Neurological: Negative.  Negative for dizziness and headaches.  All other systems reviewed and are negative.   Physical Exam Vitals reviewed.  Constitutional:      Appearance: Normal appearance. He is  obese.  HENT:     Head: Normocephalic.  Eyes:     Extraocular Movements: Extraocular movements intact.     Pupils: Pupils are equal, round, and reactive to light.  Cardiovascular:     Rate and Rhythm: Normal rate and regular rhythm.     Pulses: Normal pulses.     Heart sounds: Normal heart sounds.  Pulmonary:     Effort: Pulmonary effort is normal.     Breath sounds: Normal breath sounds.  Musculoskeletal:     Cervical back: No tenderness.  Lymphadenopathy:     Cervical: No cervical adenopathy.  Skin:    General: Skin is warm and dry.     Capillary Refill: Capillary refill takes less than 2 seconds.  Neurological:     General: No focal deficit present.     Mental Status: He is alert and oriented to person, place, and time.  Psychiatric:        Mood and Affect: Mood normal.        Behavior: Behavior normal.    Results for orders placed or performed in visit on 11/01/21 (from the past 24 hour(s))  POCT glycosylated hemoglobin (Hb A1C)     Status: Abnormal   Collection Time: 11/01/21  1:54 PM  Result Value Ref  Range   Hemoglobin A1C 6.7 (A) 4.0 - 5.6 %   HbA1c POC (<> result, manual entry)     HbA1c, POC (prediabetic range)     HbA1c, POC (controlled diabetic range)      ASSESSMENT & PLAN: Problem List Items Addressed This Visit       Cardiovascular and Mediastinum   Hypertension associated with diabetes ([place]) - Primary    Well-controlled hypertension. BP Readings from Last 3 Encounters:  11/01/21 136/82  07/06/21 136/68  06/15/21 136/88  Continue olmesartan 20 mg daily. Dietary approaches to stop hypertension discussed. Well-controlled diabetes with hemoglobin A1c of 6.7 Lab Results  Component Value Date   HGBA1C 6.7 (A) 11/01/2021  Patient has not taken any insulin or Rybelsus as prescribed 4 months ago. Only taking metformin and wishes to switch to a different medication. We will start Trulicity 2.72 mg weekly.  Demonstration pen use with patient. Diet and nutrition discussed. Follow-up in 3 months.       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  Stable.  No problems.        Endocrine   Type II diabetes mellitus with manifestations (HCC)   Relevant Medications   Dulaglutide (TRULICITY) 7.42 VZ/5.6LO SOPN   Other Relevant Orders   POCT glycosylated hemoglobin (Hb A1C) (Completed)     Other   Hyperlipidemia with target LDL less than 100   Other Visit Diagnoses     Screening for colon cancer       Relevant Orders   Ambulatory referral to Gastroenterology   Type 2 diabetes mellitus with hyperglycemia, without long-term current use of insulin (HCC)       Relevant Medications   Dulaglutide (TRULICITY) 7.56 EP/3.2RJ SOPN   Other Relevant Orders   Hemoglobin A1c   Comprehensive metabolic panel   Lipid panel      Patient Instructions  Diabetes Mellitus and Nutrition, Adult When you have diabetes, or diabetes mellitus, it is very important to have healthy  eating habits because your blood sugar (glucose) levels are greatly affected by what you eat and drink.  Eating healthy foods in the right amounts, at about the same times every day, can help you: Manage your blood glucose. Lower your risk of heart disease. Improve your blood pressure. Reach or maintain a healthy weight. What can affect my meal plan? Every person with diabetes is different, and each person has different needs for a meal plan. Your health care provider may recommend that you work with a dietitian to make a meal plan that is best for you. Your meal plan may vary depending on factors such as: The calories you need. The medicines you take. Your weight. Your blood glucose, blood pressure, and cholesterol levels. Your activity level. Other health conditions you have, such as heart or kidney disease. How do carbohydrates affect me? Carbohydrates, also called carbs, affect your blood glucose level more than any other type of food. Eating carbs raises the amount of glucose in your blood. It is important to know how many carbs you can safely have in each meal. This is different for every person. Your dietitian can help you calculate how many carbs you should have at each meal and for each snack. How does alcohol affect me? Alcohol can cause a decrease in blood glucose (hypoglycemia), especially if you use insulin or take certain diabetes medicines by mouth. Hypoglycemia can be a life-threatening condition. Symptoms of hypoglycemia, such as sleepiness, dizziness, and confusion, are similar to symptoms of having too much alcohol. Do not drink alcohol if: Your health care provider tells you not to drink. You are pregnant, may be pregnant, or are planning to become pregnant. If you drink alcohol: Limit how much you have to: 0-1 drink a day for women. 0-2 drinks a day for men. Know how much alcohol is in your drink. In the [place] one drink equals one 12 oz bottle of beer (355 mL), one 5 oz  glass of wine (148 mL), or one 1 oz glass of hard liquor (44 mL). Keep yourself hydrated with water, diet soda, or unsweetened iced tea. Keep in mind that regular soda, juice, and other mixers may contain a lot of sugar and must be counted as carbs. What are tips for following this plan? Reading food labels Start by checking the serving size on the Nutrition Facts label of packaged foods and drinks. The number of calories and the amount of carbs, fats, and other nutrients listed on the label are based on one serving of the item. Many items contain more than one serving per package. Check the total grams (g) of carbs in one serving. Check the number of grams of saturated fats and trans fats in one serving. Choose foods that have a low amount or none of these fats. Check the number of milligrams (mg) of salt (sodium) in one serving. Most people should limit total sodium intake to less than 2,300 mg per day. Always check the nutrition information of foods labeled as "low-fat" or "nonfat." These foods may be higher in added sugar or refined carbs and should be avoided. Talk to your dietitian to identify your daily goals for nutrients listed on the label. Shopping Avoid buying canned, pre-made, or processed foods. These foods tend to be high in fat, sodium, and added sugar. Shop around the outside edge of the grocery store. This is where you will most often find fresh fruits and vegetables, bulk grains, fresh meats, and fresh dairy products. Cooking Use low-heat cooking methods, such as baking, instead of high-heat cooking methods, such as deep frying. Cook using healthy oils,  such as olive, canola, or sunflower oil. Avoid cooking with butter, cream, or high-fat meats. Meal planning Eat meals and snacks regularly, preferably at the same times every day. Avoid going long periods of time without eating. Eat foods that are high in fiber, such as fresh fruits, vegetables, beans, and whole grains. Eat 4-6  oz (112-168 g) of lean protein each day, such as lean meat, chicken, fish, eggs, or tofu. One ounce (oz) (28 g) of lean protein is equal to: 1 oz (28 g) of meat, chicken, or fish. 1 egg.  cup (62 g) of tofu. Eat some foods each day that contain healthy fats, such as avocado, nuts, seeds, and fish. What foods should I eat? Fruits Berries. Apples. Oranges. Peaches. Apricots. Plums. Grapes. Mangoes. Papayas. Pomegranates. Kiwi. Cherries. Vegetables Leafy greens, including lettuce, spinach, kale, chard, collard greens, mustard greens, and cabbage. Beets. Cauliflower. Broccoli. Carrots. Green beans. Tomatoes. Peppers. Onions. Cucumbers. Brussels sprouts. Grains Whole grains, such as whole-wheat or whole-grain bread, crackers, tortillas, cereal, and pasta. Unsweetened oatmeal. Quinoa. Brown or wild rice. Meats and other proteins Seafood. Poultry without skin. Lean cuts of poultry and beef. Tofu. Nuts. Seeds. Dairy Low-fat or fat-free dairy products such as milk, yogurt, and cheese. The items listed above may not be a complete list of foods and beverages you can eat and drink. Contact a dietitian for more information. What foods should I avoid? Fruits Fruits canned with syrup. Vegetables Canned vegetables. Frozen vegetables with butter or cream sauce. Grains Refined white flour and flour products such as bread, pasta, snack foods, and cereals. Avoid all processed foods. Meats and other proteins Fatty cuts of meat. Poultry with skin. Breaded or fried meats. Processed meat. Avoid saturated fats. Dairy Full-fat yogurt, cheese, or milk. Beverages Sweetened drinks, such as soda or iced tea. The items listed above may not be a complete list of foods and beverages you should avoid. Contact a dietitian for more information. Questions to ask a health care provider Do I need to meet with a certified diabetes care and education specialist? Do I need to meet with a dietitian? What number can I call  if I have questions? When are the best times to check my blood glucose? Where to find more information: American Diabetes Association: diabetes.org Academy of Nutrition and Dietetics: eatright.[employer] of Diabetes and Digestive and Kidney Diseases: [employer] Association of Diabetes Care & Education Specialists: diabeteseducator.org Summary It is important to have healthy eating habits because your blood sugar (glucose) levels are greatly affected by what you eat and drink. It is important to use alcohol carefully. A healthy meal plan will help you manage your blood glucose and lower your risk of heart disease. Your health care provider may recommend that you work with a dietitian to make a meal plan that is best for you. This information is not intended to replace advice given to you by your health care provider. Make sure you discuss any questions you have with your health care provider. Document Revised: 06/03/2020 Document Reviewed: 06/03/2020 Elsevier Patient Education  2022 [place], MD [facility]

## 2021-11-01 NOTE — Patient Instructions (Signed)

## 2021-11-01 NOTE — Assessment & Plan Note (Addendum)
Well-controlled hypertension. BP Readings from Last 3 Encounters:  11/01/21 136/82  07/06/21 136/68  06/15/21 136/88  Continue olmesartan 20 mg daily. Dietary approaches to stop hypertension discussed. Well-controlled diabetes with hemoglobin A1c of 6.7 Lab Results  Component Value Date   HGBA1C 6.7 (A) 11/01/2021  Patient has not taken any insulin or Rybelsus as prescribed 4 months ago. Only taking metformin and wishes to switch to a different medication. We will start Trulicity 5.87 mg weekly.  Demonstration pen use with patient. Diet and nutrition discussed. Follow-up in 3 months.

## 2021-12-21 ENCOUNTER — Inpatient Hospital Stay: Payer: Medicare Other | Attending: Hematology

## 2021-12-21 ENCOUNTER — Inpatient Hospital Stay (HOSPITAL_BASED_OUTPATIENT_CLINIC_OR_DEPARTMENT_OTHER): Payer: Medicare Other | Admitting: Hematology

## 2021-12-21 ENCOUNTER — Other Ambulatory Visit: Payer: Self-pay

## 2021-12-21 VITALS — BP 117/59 | HR 77 | Temp 97.9°F | Resp 18 | Wt 292.2 lb

## 2021-12-21 DIAGNOSIS — C169 Malignant neoplasm of stomach, unspecified: Secondary | ICD-10-CM

## 2021-12-21 DIAGNOSIS — C160 Malignant neoplasm of cardia: Secondary | ICD-10-CM

## 2021-12-21 DIAGNOSIS — Z452 Encounter for adjustment and management of vascular access device: Secondary | ICD-10-CM | POA: Insufficient documentation

## 2021-12-21 DIAGNOSIS — Z7189 Other specified counseling: Secondary | ICD-10-CM

## 2021-12-21 DIAGNOSIS — Z8509 Personal history of malignant neoplasm of other digestive organs: Secondary | ICD-10-CM | POA: Diagnosis not present

## 2021-12-21 DIAGNOSIS — Z95828 Presence of other vascular implants and grafts: Secondary | ICD-10-CM

## 2021-12-21 LAB — CMP (CANCER CENTER ONLY)
ALT: 17 U/L (ref 0–44)
AST: 14 U/L — ABNORMAL LOW (ref 15–41)
Albumin: 4 g/dL (ref 3.5–5.0)
Alkaline Phosphatase: 55 U/L (ref 38–126)
Anion gap: 6 (ref 5–15)
BUN: 16 mg/dL (ref 8–23)
CO2: 27 mmol/L (ref 22–32)
Calcium: 9.1 mg/dL (ref 8.9–10.3)
Chloride: 105 mmol/L (ref 98–111)
Creatinine: 1.01 mg/dL (ref 0.61–1.24)
GFR, Estimated: >60 mL/min (ref >60)
Glucose, Bld: 139 mg/dL — ABNORMAL HIGH (ref 70–99)
Potassium: 4.3 mmol/L (ref 3.5–5.1)
Sodium: 138 mmol/L (ref 135–145)
Total Bilirubin: 0.5 mg/dL (ref 0.3–1.2)
Total Protein: 7.4 g/dL (ref 6.5–8.1)

## 2021-12-21 LAB — CBC WITH DIFFERENTIAL/PLATELET
Abs Immature Granulocytes: 0.01 10*3/uL (ref 0.00–0.07)
Basophils Absolute: 0 10*3/uL (ref 0.0–0.1)
Basophils Relative: 1 %
Eosinophils Absolute: 0 10*3/uL (ref 0.0–0.5)
Eosinophils Relative: 1 %
HCT: 35.5 % — ABNORMAL LOW (ref 39.0–52.0)
Hemoglobin: 11.6 g/dL — ABNORMAL LOW (ref 13.0–17.0)
Immature Granulocytes: 0 %
Lymphocytes Relative: 18 %
Lymphs Abs: 0.9 10*3/uL (ref 0.7–4.0)
MCH: 28.2 pg (ref 26.0–34.0)
MCHC: 32.7 g/dL (ref 30.0–36.0)
MCV: 86.4 fL (ref 80.0–100.0)
Monocytes Absolute: 0.5 10*3/uL (ref 0.1–1.0)
Monocytes Relative: 9 %
Neutro Abs: 3.8 10*3/uL (ref 1.7–7.7)
Neutrophils Relative %: 71 %
Platelets: 158 10*3/uL (ref 150–400)
RBC: 4.11 MIL/uL — ABNORMAL LOW (ref 4.22–5.81)
RDW: 13.2 % (ref 11.5–15.5)
WBC: 5.3 10*3/uL (ref 4.0–10.5)
nRBC: 0 % (ref 0.0–0.2)

## 2021-12-21 LAB — CEA (IN HOUSE-CHCC): CEA (CHCC-In House): 1.86 ng/mL (ref 0.00–5.00)

## 2021-12-21 MED ORDER — HEPARIN SOD (PORK) LOCK FLUSH 100 UNIT/ML IV SOLN
500.0000 [IU] | Freq: Once | INTRAVENOUS | Status: AC | PRN
  Administered 2021-12-21: 500 [IU] via INTRAVENOUS

## 2021-12-21 MED ORDER — SODIUM CHLORIDE 0.9% FLUSH
10.0000 mL | INTRAVENOUS | Status: DC | PRN
  Administered 2021-12-21: 10 mL via INTRAVENOUS

## 2021-12-22 ENCOUNTER — Telehealth: Payer: Self-pay | Admitting: Hematology

## 2021-12-22 NOTE — Telephone Encounter (Signed)
Scheduled follow-up appointments per 2/7 los. Patient is aware. °

## 2021-12-27 ENCOUNTER — Encounter: Payer: Self-pay | Admitting: Hematology

## 2021-12-27 NOTE — Progress Notes (Signed)
HEMATOLOGY/ONCOLOGY CLINIC NOTE  Date of Service: .12/21/2021  Patient Care Team: Steven Lima, MD as PCP - General (Internal Medicine) [name] (Surgery)    CHIEF COMPLAINTS/PURPOSE OF CONSULTATION:   F/u for continued management of gastric adenocarcinoma  HISTORY OF PRESENTING ILLNESS:   [name] is a wonderful 71 y.o. male is here for continued evaluation and management of recently diagnosed pT4a, pN3b, pM1 Gastric Adenocarcinoma.   Patient was previously seen in the hospital on 01/19/2017 with a diagnosis of gastric adenocarcinoma in the gastric pylorus causing significant intrinsic stenosis . Patient had presented to primary care physician with dyspeptic symptoms, partial gastric outlet obstruction and weight loss of 60 pounds. Patient has had a distal gastrectomy, cholecystectomy with jejunal feeding tube placement by Dr. Barry Dienes on 01/10/2017 for gastric obstruction secondary to adenocarcinoma of the gastric pylorus. He was noted to have invaded into the pancreas and a bit of the anterior pancreatic head was resected en bloc with the tumor.   Pathology showed  " INVASIVE MODERATELY TO POORLY DIFFERENTIATED ADENOCARCINOMA, SPANNING 4 CM IN GREATEST DIMENSION. TUMOR INVADES THROUGH STOMACH WALL TO INVOLVE SEROSAL SURFACE.  LYMPH/VASCULAR INVASION IS IDENTIFIED.  MARGINS ARE NEGATIVE.- FIFTEEN OF THIRTY NINE LYMPH NODES POSITIVE FOR METASTATIC ADENOCARCINOMA (15/39). EXTRACAPSULAR EXTENSION IS IDENTIFIED. 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  Patient had a prolonged hospitalization post surgery  due to significant NG tube output. Needed epidural and PCA for pain control. It took a while for him to be able to tolerate progression of 2 feeding. He had an abdominal drain due to a small collection near his pancreatic head. Was treated with Augmentin for this. Patient was in the hospital  from 01/10/2017 and was eventually discharged on 01/27/2017.  Patient postponed several attempts at posthospitalization follow-up. He has been recovering at home and has been optimizing his tube feeding. Patient was recently followed up by Dr. Barry Dienes and has had a port placement on 02/23/2017 and preparation for possible chemotherapy.  He notes that his tube feeding is up to 40 mL per hour for 12 hours and has targeted is 16 ml per hour. He was recently admitted to [facility] in [place] for a C. difficile colitis that caused a severe diarrhea. he is currently on oral vancomycin has completed about 5 days of treatment and is also on probiotics.  CURRENT THERAPY: Active surveillance  PREVIOUS THERAPY:  Concurrent Chemoradiation with Xeloda. He started 07/18/17 and completed 08/25/17  INTERVAL HISTORY   [name] is here for follow-up of his gastric adenocarcinoma for his 66-monthsurveillance visit. He notes no acute new symptoms since his last clinic visit.  No change in bowel habits.  No abdominal pain or distention.  No nausea or vomiting. No shortness of breath or chest pain.  No evidence of GI bleeding. Labs done today that CBC stable hemoglobin of 11.6 with normal WBC count and platelets. CMP within normal limits CEA within normal limits at 1.86   MEDICAL HISTORY:  Past Medical History:  Diagnosis Date   Anemia    [place]   Gastric Adenocarcinoma   Gastric outlet obstruction 01/2017   GERD (gastroesophageal reflux disease)    Pre-diabetes    denies   [place] ([place] tachycardia) [place]    during Admission and Surgery- 01/2017    SURGICAL HISTORY: Past Surgical History:  Procedure Laterality Date   CHOLECYSTECTOMY N/A 01/10/2017   Procedure: CHOLECYSTECTOMY;  Surgeon:  Byerly, MD;  Location: [facility] OR;  Service: General;  Laterality: N/A;  ° COLONOSCOPY    ° COLONOSCOPY  10/11/2011  ° Procedure: COLONOSCOPY;  Surgeon: Robert M Rourk, MD;  Location: [facility];  Service: Endoscopy;  Laterality: N/A;  10:30 AM  ° ESOPHAGOGASTRODUODENOSCOPY N/A 12/30/2016  ° Procedure: ESOPHAGOGASTRODUODENOSCOPY (EGD);  Surgeon: Patrick Hung, MD;  Location: [facility] ENDOSCOPY;  Service: Endoscopy;  Laterality: N/A;  ° GASTRECTOMY N/A 01/10/2017  ° Procedure: DISTAL GASTRECTOMY, JEJUNUM  FEEDING TUBE PLACEMENT;  Surgeon: Faera Byerly, MD;  Location: [facility] OR;  Service: General;  Laterality: N/A;  ° IR GENERIC HISTORICAL  01/24/2017  ° IR REPLC DUODEN/JEJUNO TUBE PERCUT W/FLUORO 01/24/2017 Glenn Yamagata, MD [facility] RAD  ° IR REPLC DUODEN/JEJUNO TUBE PERCUT W/FLUORO  03/26/2017  ° PORTACATH PLACEMENT N/A 02/23/2017  ° Procedure: INSERTION PORT-A-CATH;  Surgeon: Faera Byerly, MD;  Location: [facility] OR;  Service: General;  Laterality: N/A;  ° testicle removed  1982  ° ° °SOCIAL HISTORY: °Social History  ° °Socioeconomic History  ° Marital status: Married  °  Spouse name: Not on file  ° Number of children: Not on file  ° Years of education: Not on file  ° Highest education level: Not on file  °Occupational History  ° Not on file  °Tobacco Use  ° Smoking status: Never  ° Smokeless tobacco: Never  °Vaping Use  ° Vaping Use: Unknown  °Substance and Sexual Activity  ° Alcohol use: No  °  Comment: occ beer or wine but none in last 6 months   ° Drug use: No  ° Sexual activity: Not on file  °Other Topics Concern  ° Not on file  °Social History Narrative  ° Not on file  ° °Social Determinants of Health  ° °Financial Resource Strain: Not on file  °Food Insecurity: Not on file  °Transportation Needs: Not on file  °Physical Activity: Not on file  °Stress: Not on file  °Social Connections: Not on file  °Intimate Partner Violence: Not on file  ° ° °FAMILY HISTORY: °Family History  °Problem Relation Age of Onset  ° Prostate cancer Father   ° Colon cancer Neg Hx   ° ° °ALLERGIES:  is allergic to no known allergies. ° °MEDICATIONS:  °Current Outpatient Medications  °Medication Sig Dispense Refill  ° Blood Glucose  Calibration (ACCU-CHEK AVIVA) SOLN 1 Act by In Vitro route daily. 1 each 2  ° Blood Glucose Monitoring Suppl (ACCU-CHEK AVIVA PLUS) w/Device KIT 1 Act by Does not apply route 4 (four) times daily -  with meals and at bedtime. 1 kit 3  ° Continuous Blood Gluc Receiver (FREESTYLE LIBRE 2 READER) DEVI 1 Act by Does not apply route daily. 2 each 5  ° Continuous Blood Gluc Sensor (FREESTYLE LIBRE 2 SENSOR) MISC 1 Act by Does not apply route daily. 2 each 5  ° Dulaglutide (TRULICITY) 0.75 MG/0.5ML SOPN Inject 0.75 mg into the skin once a week. 2 mL 6  ° ferrous sulfate 325 (65 FE) MG tablet Take 325 mg by mouth daily with breakfast.    ° glucose blood (ACCU-CHEK AVIVA PLUS) test strip 1 each by Other route with breakfast, with lunch, and with evening meal. Use as instructed 300 strip 1  ° Insulin Pen Needle 32G X 6 MM MISC 1 Act by Does not apply route 4 (four) times daily. 400 each 1  ° ketoconazole (NIZORAL) 2 % cream Apply 1 application topically daily. 30 g 5  ° Multiple Vitamin (MULTIVITAMIN)   MULTIVITAMIN) tablet Take 1 tablet by mouth daily.     olmesartan (BENICAR) 20 MG tablet Take 1 tablet (20 mg total) by mouth daily. 90 tablet 1   rosuvastatin (CRESTOR) 10 MG tablet Take 1 tablet (10 mg total) by mouth daily. 90 tablet 1   No current facility-administered medications for this visit.    REVIEW OF SYSTEMS:   .10 Point review of Systems was done is negative except as noted above.  PHYSICAL EXAMINATION:  .BP (!) 117/59    Pulse 77    Temp 97.9 F (36.6 C)    Resp 18    Wt 292 lb 3.2 oz (132.5 kg)    SpO2 98%    BMI 40.75 kg/m  NAD . GENERAL:alert, in no acute distress and comfortable SKIN: no acute rashes, no significant lesions EYES: conjunctiva are pink and non-injected, sclera anicteric OROPHARYNX: MMM, no exudates, no oropharyngeal erythema or ulceration NECK: supple, no JVD LYMPH:  no palpable lymphadenopathy in the cervical, axillary or inguinal regions LUNGS: clear to auscultation b/l with normal  respiratory effort HEART: regular rate & rhythm ABDOMEN:  normoactive bowel sounds , non tender, not distended. Extremity: no pedal edema PSYCH: alert & oriented x 3 with fluent speech NEURO: no focal motor/sensory deficits   LABORATORY DATA:  I have reviewed the data as listed  CBC Latest Ref Rng & Units 12/21/2021 07/06/2021 06/15/2021  WBC 4.0 - 10.5 K/uL 5.3 4.2 5.6  Hemoglobin 13.0 - 17.0 g/dL 11.6(L) 11.7(L) 12.8(L)  Hematocrit 39.0 - 52.0 % 35.5(L) 35.7(L) 38.8(L)  Platelets 150 - 400 K/uL 158 162 107.0 Repeated and verified X2.(L)    CMP Latest Ref Rng & Units 12/21/2021 11/01/2021 07/06/2021  Glucose 70 - 99 mg/dL 139(H) 162(H) 144(H)  BUN 8 - 23 mg/dL _0 Creatinine 0.61 - 1.24 mg/dL 1.01 0.96 1.13  Sodium 135 - 145 mmol/L 138 136 140  Potassium 3.5 - 5.1 mmol/L 4.3 4.4 4.3  Chloride 98 - 111 mmol/L 105 102 106  CO2 22 - 32 mmol/L _1 Calcium 8.9 - 10.3 mg/dL 9.1 9.5 9.6  Total Protein 6.5 - 8.1 g/dL 7.4 7.3 7.2  Total Bilirubin 0.3 - 1.2 mg/dL 0.5 0.5 0.7  Alkaline Phos 38 - 126 U/L 55 52 59  AST 15 - 41 U/L 14(L) 24 23  ALT 0 - 44 U/L _2 RADIOGRAPHIC STUDIES: I have personally reviewed the radiological images as listed and agreed with the findings in the report. No results found.   ASSESSMENT & PLAN:   71 y.o.  African-American male with   1) Resected Stage IV pT4a, pN3b, pM1 invasive moderately-poorly Gastric Adenocarcinoma . Her 2 neg by FISH LVI + margins neg 15/39 LN +ve, Extracapsular invasion +, some involvement of pancreatic capsule there pM1 Presented with severe pyloric stenosis with ulceration. S/p 60 pound weight loss- now resolved. S/p distal gastrectomy, cholecystectomy and en bloc dissection off the small part of the pancreatic head  with jejunal feeding tube placement. Initial CT abdomen and pelvis did not show any local regional lymphadenopathy. Rpt CT chest/abd/pelvis from 03/22/2017 - no overt evidence of residual disease  or metastatic disease. Post-surgical peripancreatic fluid collection resolving. CT chest/abd/pelvis 06/21/2017 after 6 cycles of FOLFOX -  No evidence of residual or metastatic carcinoma within the chest, abdomen, or pelvis -Started Chemoradiation on 07/18/17 with Xeloda (836m/m2 twice daily Monday through Friday while on radiation) and completed treatment on 08/24/2017. CT  chest/abd/pelvis (11/16/2017) - show radiation related changes but no evidence of tumor progression at this time.  07/13/18 CT C/A/P revealed No evidence of metastatic disease in the chest, abdomen or pelvis. Previously described mildly enlarged high left para-aortic retroperitoneal node is decreased and now normal in size. Previously described peripancreatic fat stranding is decreased and probably treatment related.   03/11/19 CT A/P revealed "Status post distal gastrectomy, without findings to suggest local recurrence of disease or definite metastatic disease in the abdomen or pelvis. 2. Colonic diverticulosis without evidence of acute diverticulitis at this time. 3. Incidental findings, as above, similar to prior studies."  09/30/2019 CT C/A/P [phone]) revealed "1. Status post distal gastrectomy with gastrojejunostomy. No specific findings identified to suggest local tumor recurrence or metastatic disease. 2. Aortic atherosclerosis. Coronary artery calcifications noted. Aortic Atherosclerosis (ICD10-I70.0)."  #2 status post port placement on 02/23/2017 by Dr. Barry Dienes   #3 h/o Clostridium difficile colitis -. No overt issues with diarrhea currently.   #4 suspected sleep apnea. Has not had a sleep study. Wife notes significant snoring which is important with weight loss.  Will have to be careful with excessive sedation. -continue prn low dose trazodone for insomnia -recommended he f/u with his PCP for consideration of sleep study-pending   PLAN: -Discussed lab results with the patient-stable CBC and CMP and normal CEA  level. -Patient will not do lab or clinical evidence of gastric adenocarcinoma recurrence at this time. -We will get repeat CT chest abdomen pelvis in a couple of weeks -Recommended again for pt to f/u w PCP regarding referral for sleep study and potential sleep apnea.   FOLLOW UP: CT chest/abd/pelvis in 2 weeks Portflush in 2 months  Portflush in 24 weeks with labs and MD visit   All of the patient's questions were answered with apparent satisfaction. The patient knows to call the clinic with any problems, questions or concerns.    Sullivan Lone MD [place] AAHIVMS [facility] Hematology/Oncology Physician [facility]

## 2022-01-04 ENCOUNTER — Encounter (HOSPITAL_COMMUNITY): Payer: Self-pay

## 2022-01-04 ENCOUNTER — Other Ambulatory Visit: Payer: Self-pay

## 2022-01-04 ENCOUNTER — Ambulatory Visit (HOSPITAL_COMMUNITY)
Admission: RE | Admit: 2022-01-04 | Discharge: 2022-01-04 | Disposition: A | Discharge status: Home / Self Care | Payer: Medicare Other | Source: Ambulatory Visit | Attending: Hematology | Admitting: Hematology

## 2022-01-04 DIAGNOSIS — K449 Diaphragmatic hernia without obstruction or gangrene: Secondary | ICD-10-CM | POA: Diagnosis not present

## 2022-01-04 DIAGNOSIS — C169 Malignant neoplasm of stomach, unspecified: Secondary | ICD-10-CM | POA: Diagnosis not present

## 2022-01-04 DIAGNOSIS — I2510 Atherosclerotic heart disease of native coronary artery without angina pectoris: Secondary | ICD-10-CM | POA: Diagnosis not present

## 2022-01-04 DIAGNOSIS — Z903 Acquired absence of stomach [part of]: Secondary | ICD-10-CM | POA: Diagnosis not present

## 2022-01-04 MED ORDER — IOHEXOL 300 MG/ML  SOLN
100.0000 mL | Freq: Once | INTRAMUSCULAR | Status: AC | PRN
  Administered 2022-01-04: 100 mL via INTRAVENOUS

## 2022-01-04 MED ORDER — IOHEXOL 9 MG/ML PO SOLN
1000.0000 mL | ORAL | Status: AC
  Administered 2022-01-04: 1000 mL via ORAL

## 2022-01-04 MED ORDER — HEPARIN SOD (PORK) LOCK FLUSH 100 UNIT/ML IV SOLN
INTRAVENOUS | Status: AC
  Filled 2022-01-04: qty 5

## 2022-01-04 MED ORDER — SODIUM CHLORIDE (PF) 0.9 % IJ SOLN
INTRAMUSCULAR | Status: AC
  Filled 2022-01-04: qty 50

## 2022-01-04 MED ORDER — HEPARIN SOD (PORK) LOCK FLUSH 100 UNIT/ML IV SOLN
500.0000 [IU] | Freq: Once | INTRAVENOUS | Status: AC
  Administered 2022-01-04: 500 [IU] via INTRAVENOUS

## 2022-02-08 ENCOUNTER — Other Ambulatory Visit: Payer: Self-pay | Admitting: Internal Medicine

## 2022-02-08 DIAGNOSIS — I10 Essential (primary) hypertension: Secondary | ICD-10-CM

## 2022-02-08 DIAGNOSIS — E785 Hyperlipidemia, unspecified: Secondary | ICD-10-CM

## 2022-02-08 DIAGNOSIS — E118 Type 2 diabetes mellitus with unspecified complications: Secondary | ICD-10-CM

## 2022-02-08 DIAGNOSIS — Z794 Long term (current) use of insulin: Secondary | ICD-10-CM

## 2022-02-08 MED ORDER — OLMESARTAN MEDOXOMIL 20 MG PO TABS: 20.0000 mg | ORAL_TABLET | Freq: Every day | ORAL | 0 refills | Status: DC

## 2022-02-08 MED ORDER — ROSUVASTATIN CALCIUM 10 MG PO TABS: 10.0000 mg | ORAL_TABLET | Freq: Every day | ORAL | 0 refills | Status: DC

## 2022-02-15 ENCOUNTER — Telehealth: Payer: Self-pay

## 2022-02-15 ENCOUNTER — Inpatient Hospital Stay: Payer: Medicare Other

## 2022-02-15 DIAGNOSIS — E118 Type 2 diabetes mellitus with unspecified complications: Secondary | ICD-10-CM

## 2022-02-15 DIAGNOSIS — I10 Essential (primary) hypertension: Secondary | ICD-10-CM

## 2022-02-15 DIAGNOSIS — E785 Hyperlipidemia, unspecified: Secondary | ICD-10-CM

## 2022-02-15 MED ORDER — ROSUVASTATIN CALCIUM 10 MG PO TABS: 10.0000 mg | ORAL_TABLET | Freq: Every day | ORAL | 0 refills | Status: DC

## 2022-02-15 MED ORDER — OLMESARTAN MEDOXOMIL 20 MG PO TABS: 20.0000 mg | ORAL_TABLET | Freq: Every day | ORAL | 0 refills | Status: DC

## 2022-02-15 NOTE — Telephone Encounter (Signed)
Pt states that he was seen by Dr. Mitchel Honour 11/01/21 and states that it was discussed that Dr. Mitchel Honour would take over as being PCP. I advised the pt that Dr. Ronnald Ramp is still listed as PCP.  ? ?I advised the pt that I would have to get the ok from both providers and I would give him a call back to proceed with scheduling upon approval. ?

## 2022-02-15 NOTE — Telephone Encounter (Signed)
I am okay with this

## 2022-02-15 NOTE — Telephone Encounter (Signed)
Pt is requesting a refill on: ?olmesartan (BENICAR) 20 MG tablet ?rosuvastatin (CRESTOR) 10 MG tablet ? ?These medications were sent to [facility] and the pt no longer uses that pharmacy and would like it to be sent to  ?[facility] [address] [facility] - [place], [place] - [address] ?

## 2022-02-17 ENCOUNTER — Inpatient Hospital Stay: Payer: Medicare Other | Attending: Hematology

## 2022-02-17 ENCOUNTER — Other Ambulatory Visit: Payer: Self-pay

## 2022-02-17 ENCOUNTER — Encounter: Payer: Self-pay | Admitting: Hematology

## 2022-02-17 DIAGNOSIS — Z8509 Personal history of malignant neoplasm of other digestive organs: Secondary | ICD-10-CM | POA: Insufficient documentation

## 2022-02-17 DIAGNOSIS — Z95828 Presence of other vascular implants and grafts: Secondary | ICD-10-CM

## 2022-02-17 DIAGNOSIS — C160 Malignant neoplasm of cardia: Secondary | ICD-10-CM

## 2022-02-17 DIAGNOSIS — Z452 Encounter for adjustment and management of vascular access device: Secondary | ICD-10-CM | POA: Insufficient documentation

## 2022-02-17 DIAGNOSIS — Z7189 Other specified counseling: Secondary | ICD-10-CM

## 2022-02-17 MED ORDER — HEPARIN SOD (PORK) LOCK FLUSH 100 UNIT/ML IV SOLN
500.0000 [IU] | Freq: Once | INTRAVENOUS | Status: AC | PRN
  Administered 2022-02-17: 500 [IU] via INTRAVENOUS

## 2022-02-17 MED ORDER — SODIUM CHLORIDE 0.9% FLUSH
10.0000 mL | INTRAVENOUS | Status: DC | PRN
  Administered 2022-02-17: 10 mL via INTRAVENOUS

## 2022-03-23 ENCOUNTER — Encounter: Payer: Medicare Other | Admitting: Emergency Medicine

## 2022-05-09 ENCOUNTER — Other Ambulatory Visit: Payer: Self-pay | Admitting: Internal Medicine

## 2022-05-09 DIAGNOSIS — E118 Type 2 diabetes mellitus with unspecified complications: Secondary | ICD-10-CM

## 2022-05-09 DIAGNOSIS — I10 Essential (primary) hypertension: Secondary | ICD-10-CM

## 2022-05-09 DIAGNOSIS — E119 Type 2 diabetes mellitus without complications: Secondary | ICD-10-CM

## 2022-05-09 DIAGNOSIS — E785 Hyperlipidemia, unspecified: Secondary | ICD-10-CM

## 2022-06-06 ENCOUNTER — Other Ambulatory Visit: Payer: Self-pay

## 2022-06-06 ENCOUNTER — Ambulatory Visit (INDEPENDENT_AMBULATORY_CARE_PROVIDER_SITE_OTHER): Payer: Medicare Other | Admitting: Emergency Medicine

## 2022-06-06 ENCOUNTER — Encounter: Payer: Self-pay | Admitting: Emergency Medicine

## 2022-06-06 VITALS — BP 140/78 | HR 70 | Temp 98.2°F | Ht 71.0 in | Wt 296.5 lb

## 2022-06-06 DIAGNOSIS — E1159 Type 2 diabetes mellitus with other circulatory complications: Secondary | ICD-10-CM

## 2022-06-06 DIAGNOSIS — I10 Essential (primary) hypertension: Secondary | ICD-10-CM | POA: Diagnosis not present

## 2022-06-06 DIAGNOSIS — E118 Type 2 diabetes mellitus with unspecified complications: Secondary | ICD-10-CM | POA: Diagnosis not present

## 2022-06-06 DIAGNOSIS — C160 Malignant neoplasm of cardia: Secondary | ICD-10-CM

## 2022-06-06 DIAGNOSIS — E785 Hyperlipidemia, unspecified: Secondary | ICD-10-CM | POA: Diagnosis not present

## 2022-06-06 DIAGNOSIS — E1165 Type 2 diabetes mellitus with hyperglycemia: Secondary | ICD-10-CM | POA: Diagnosis not present

## 2022-06-06 DIAGNOSIS — I152 Hypertension secondary to endocrine disorders: Secondary | ICD-10-CM

## 2022-06-06 LAB — COMPREHENSIVE METABOLIC PANEL
ALT: 21 U/L (ref 0–53)
AST: 19 U/L (ref 0–37)
Albumin: 4.1 g/dL (ref 3.5–5.2)
Alkaline Phosphatase: 60 U/L (ref 39–117)
BUN: 14 mg/dL (ref 6–23)
CO2: 27 mEq/L (ref 19–32)
Calcium: 9 mg/dL (ref 8.4–10.5)
Chloride: 105 mEq/L (ref 96–112)
Creatinine, Ser: 1.04 mg/dL (ref 0.40–1.50)
GFR: 71.81 mL/min (ref >60.00)
Glucose, Bld: 135 mg/dL — ABNORMAL HIGH (ref 70–99)
Potassium: 4.4 mEq/L (ref 3.5–5.1)
Sodium: 139 mEq/L (ref 135–145)
Total Bilirubin: 0.6 mg/dL (ref 0.2–1.2)
Total Protein: 7.1 g/dL (ref 6.0–8.3)

## 2022-06-06 LAB — LIPID PANEL
Cholesterol: 113 mg/dL (ref 0–200)
HDL: 38.4 mg/dL — ABNORMAL LOW (ref >39.00)
LDL Cholesterol: 51 mg/dL (ref 0–99)
NonHDL: 74.56
Total CHOL/HDL Ratio: 3
Triglycerides: 117 mg/dL (ref 0.0–149.0)
VLDL: 23.4 mg/dL (ref 0.0–40.0)

## 2022-06-06 LAB — POCT GLYCOSYLATED HEMOGLOBIN (HGB A1C): Hemoglobin A1C: 7.4 % — AB (ref 4.0–5.6)

## 2022-06-06 LAB — TSH: TSH: 2.84 u[IU]/mL (ref 0.35–5.50)

## 2022-06-06 MED ORDER — OLMESARTAN MEDOXOMIL 20 MG PO TABS: 20.0000 mg | ORAL_TABLET | Freq: Every day | ORAL | 3 refills | Status: DC

## 2022-06-06 MED ORDER — EMPAGLIFLOZIN 10 MG PO TABS: 10.0000 mg | ORAL_TABLET | Freq: Every day | ORAL | 3 refills | Status: DC

## 2022-06-06 MED ORDER — ROSUVASTATIN CALCIUM 10 MG PO TABS: 10.0000 mg | ORAL_TABLET | Freq: Every day | ORAL | 3 refills | Status: DC

## 2022-06-06 NOTE — Progress Notes (Signed)
[name] 71 y.o.   Chief Complaint  Patient presents with   transfer of care    Few concerns     HISTORY OF PRESENT ILLNESS: Used to see Dr. Scarlette Calico but wants to transfer care to me. This is a 71 y.o. male A1A with history of diabetes and hypertension here for follow-up. Due to cost, has been off Trulicity for the last 4 to 5 weeks. Eating better.  Glucose numbers at home between 80 and 140. BP Readings from Last 3 Encounters:  06/06/22 140/78  12/21/21 (!) 117/59  11/01/21 136/82   Lab Results  Component Value Date   HGBA1C 7.2 (H) 11/01/2021     HPI   Prior to Admission medications   Medication Sig Start Date End Date Taking? Authorizing Provider  Blood Glucose Calibration (ACCU-CHEK AVIVA) SOLN 1 Act by In Vitro route daily. 06/16/21  Yes Janith Lima, MD  Blood Glucose Monitoring Suppl (ACCU-CHEK AVIVA PLUS) w/Device KIT 1 Act by Does not apply route 4 (four) times daily -  with meals and at bedtime. 06/16/21  Yes Janith Lima, MD  Continuous Blood Gluc Sensor (FREESTYLE LIBRE 2 SENSOR) MISC 1 Act by Does not apply route daily. 06/16/21  Yes Janith Lima, MD  Dulaglutide (TRULICITY) 5.88 FO/2.7XA SOPN Inject 0.75 mg into the skin once a week. 11/01/21  Yes Mansfield Dann, Ines Bloomer, MD  ferrous sulfate 325 (65 FE) MG tablet Take 325 mg by mouth daily with breakfast.   Yes [provider]  glucose blood (ACCU-CHEK AVIVA PLUS) test strip 1 each by Other route with breakfast, with lunch, and with evening meal. Use as instructed 06/16/21  Yes Janith Lima, MD  Insulin Pen Needle 32G X 6 MM MISC 1 Act by Does not apply route 4 (four) times daily. 06/16/21  Yes Janith Lima, MD  ketoconazole (NIZORAL) 2 % cream Apply 1 application topically daily. 11/01/21  Yes Rennee Coyne, Ines Bloomer, MD  Multiple Vitamin (MULTIVITAMIN) tablet Take 1 tablet by mouth daily.   Yes [provider]  olmesartan (BENICAR) 20 MG tablet Take 1 tablet (20 mg total) by mouth  daily. 02/15/22  Yes Janith Lima, MD  rosuvastatin (CRESTOR) 10 MG tablet Take 1 tablet (10 mg total) by mouth daily. 02/15/22  Yes Janith Lima, MD  Continuous Blood Gluc Receiver (FREESTYLE LIBRE 2 READER) DEVI 1 Act by Does not apply route daily. 06/16/21   Janith Lima, MD    Allergies  Allergen Reactions   No Known Allergies     Patient Active Problem List   Diagnosis Date Noted   Diabetic polyneuropathy associated with type 2 diabetes mellitus ([place]) 06/15/2021   Hyperlipidemia with target LDL less than 100 06/15/2021   Type II diabetes mellitus with manifestations ([place]) 06/15/2021   Numbness and tingling of both feet 06/15/2021   Hypertension associated with diabetes ([place]) 06/15/2021   Abnormal electrocardiogram (ECG) (EKG) 06/15/2021   Counseling regarding advance care planning and goals of care 08/13/2018   Port catheter in place 08/03/2017   GERD (gastroesophageal reflux disease) 03/25/2017   Anemia 03/25/2017   NSVT (nonsustained ventricular tachycardia) (HCC)    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   Gastric outlet obstruction 01/2017   GERD (gastroesophageal reflux disease)    Pre-diabetes    denies   SVT (supraventricular  tachycardia) ([place])    during Admission and Surgery- 01/2017    Past Surgical History:  Procedure Laterality Date   CHOLECYSTECTOMY N/A 01/10/2017   Procedure: CHOLECYSTECTOMY;  Surgeon: Stark Klein, MD;  Location: [facility];  Service: General;  Laterality: N/A;   COLONOSCOPY     COLONOSCOPY  10/11/2011   Procedure: COLONOSCOPY;  Surgeon: Daneil Dolin, MD;  [facility];  Service: Endoscopy;  Laterality: N/A;  10:30 AM   ESOPHAGOGASTRODUODENOSCOPY N/A 12/30/2016   Procedure: ESOPHAGOGASTRODUODENOSCOPY (EGD);  Surgeon: Carol Ada, MD;  Location: [facility];  Service: Endoscopy;  Laterality: N/A;    GASTRECTOMY N/A 01/10/2017   Procedure: DISTAL GASTRECTOMY, JEJUNUM  FEEDING TUBE PLACEMENT;  Surgeon: Stark Klein, MD;  Location: [facility];  Service: General;  Laterality: N/A;   IR GENERIC HISTORICAL  01/24/2017   IR [facility] DUODEN/JEJUNO TUBE PERCUT W/FLUORO 01/24/2017 Aletta Edouard, MD [facility]   IR [facility] DUODEN/JEJUNO TUBE PERCUT W/FLUORO  03/26/2017   PORTACATH PLACEMENT N/A 02/23/2017   Procedure: INSERTION PORT-A-CATH;  Surgeon: Stark Klein, MD;  Location: [facility];  Service: General;  Laterality: N/A;   testicle removed  1982    Social History   Socioeconomic History   Marital status: Married    Spouse name: Not on file   Number of children: Not on file   Years of education: Not on file   Highest education level: Not on file  Occupational History   Not on file  Tobacco Use   Smoking status: Never   Smokeless tobacco: Never  Vaping Use   Vaping Use: Unknown  Substance and Sexual Activity   Alcohol use: No    Comment: occ beer or wine but none in last 6 months    Drug use: No   Sexual activity: Not on file  Other Topics Concern   Not on file  Social History Narrative   Not on file   Social Determinants of Health   Financial Resource Strain: Not on file  Food Insecurity: Not on file  Transportation Needs: Not on file  Physical Activity: Not on file  Stress: Not on file  Social Connections: Not on file  Intimate Partner Violence: Not on file    Family History  Problem Relation Age of Onset   Prostate cancer Father    Colon cancer Neg Hx      Review of Systems  Constitutional: Negative.  Negative for chills and fever.  HENT: Negative.  Negative for congestion and sore throat.   Respiratory: Negative.  Negative for cough and shortness of breath.   Cardiovascular: Negative.  Negative for chest pain and palpitations.  Gastrointestinal: Negative.  Negative for abdominal pain, diarrhea, nausea and vomiting.  Genitourinary: Negative.   Skin: Negative.  Negative  for rash.  All other systems reviewed and are negative.  Today's Vitals   06/06/22 1426  BP: 140/78  Pulse: 70  Temp: 98.2 F (36.8 C)  TempSrc: Oral  SpO2: 99%  Weight: 296 lb 8 oz (134.5 kg)  Height: 5' 11"  (1.803 m)   Body mass index is 41.35 kg/m. Wt Readings from Last 3 Encounters:  06/06/22 296 lb 8 oz (134.5 kg)  12/21/21 292 lb 3.2 oz (132.5 kg)  11/01/21 289 lb (131.1 kg)    Physical Exam Vitals reviewed.  Constitutional:      Appearance: Normal appearance. He is obese.  HENT:     Head: Normocephalic.     Mouth/Throat:     Mouth: Mucous membranes are moist.  Pharynx: Oropharynx is clear.  Eyes:     Extraocular Movements: Extraocular movements intact.     Pupils: Pupils are equal, round, and reactive to light.  Cardiovascular:     Rate and Rhythm: Normal rate and regular rhythm.     Pulses: Normal pulses.     Heart sounds: Normal heart sounds.  Pulmonary:     Effort: Pulmonary effort is normal.     Breath sounds: Normal breath sounds.  Musculoskeletal:     Cervical back: No tenderness.     Right lower leg: No edema.     Left lower leg: No edema.  Lymphadenopathy:     Cervical: No cervical adenopathy.  Skin:    General: Skin is warm and dry.     Capillary Refill: Capillary refill takes less than 2 seconds.  Neurological:     General: No focal deficit present.     Mental Status: He is alert and oriented to person, place, and time.  Psychiatric:        Mood and Affect: Mood normal.        Behavior: Behavior normal.    Results for orders placed or performed in visit on 06/06/22 (from the past 24 hour(s))  POCT HgB A1C     Status: Abnormal   Collection Time: 06/06/22  2:53 PM  Result Value Ref Range   Hemoglobin A1C 7.4 (A) 4.0 - 5.6 %   HbA1c POC (<> result, manual entry)     HbA1c, POC (prediabetic range)     HbA1c, POC (controlled diabetic range)       ASSESSMENT & PLAN: A total of 48 minutes was spent with the patient and  counseling/coordination of care regarding preparing for this visit, review of most recent office visit notes and available medical records, review of multiple chronic medical conditions and their management, review of all medications and changes made, cardiovascular risks associated with hypertension and diabetes, education on nutrition, prognosis, documentation and need for follow-up.  Problem List Items Addressed This Visit       Cardiovascular and Mediastinum   Hypertension associated with diabetes ([place]) - Primary    Well-controlled hypertension.  Continue olmesartan 20 mg daily. Hemoglobin A1c today at 7.4.  Of Trulicity due to cost. We will start Jardiance 10 mg daily or Farxiga as per Medicare's formulary. Cardiovascular risk associated with hypertension and diabetes discussed. Diet and nutrition discussed. Follow-up in 3 months.      Relevant Medications   rosuvastatin (CRESTOR) 10 MG tablet   olmesartan (BENICAR) 20 MG tablet   empagliflozin (JARDIANCE) 10 MG TABS tablet   Other Relevant Orders   TSH   Comprehensive metabolic panel   Lipid panel     Digestive   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)     Endocrine   Type II diabetes mellitus with manifestations (HCC)   Relevant Medications   rosuvastatin (CRESTOR) 10 MG tablet   olmesartan (BENICAR) 20 MG tablet   empagliflozin (JARDIANCE) 10 MG TABS tablet     Other   Hyperlipidemia with target LDL less than 100   Relevant Medications   rosuvastatin (CRESTOR) 10 MG tablet   olmesartan (BENICAR) 20 MG tablet   Other Visit Diagnoses     Type 2 diabetes mellitus with hyperglycemia, without long-term current use of insulin (HCC)       Relevant Medications   rosuvastatin (CRESTOR) 10 MG tablet   olmesartan (BENICAR) 20 MG tablet   empagliflozin (JARDIANCE) 10 MG TABS tablet  Other Relevant Orders   POCT HgB A1C (Completed)   Primary hypertension       Relevant Medications   rosuvastatin (CRESTOR) 10 MG  tablet   olmesartan (BENICAR) 20 MG tablet      Patient Instructions  Diabetes Mellitus and Nutrition, Adult When you have diabetes, or diabetes mellitus, it is very important to have healthy eating habits because your blood sugar (glucose) levels are greatly affected by what you eat and drink. Eating healthy foods in the right amounts, at about the same times every day, can help you: Manage your blood glucose. Lower your risk of heart disease. Improve your blood pressure. Reach or maintain a healthy weight. What can affect my meal plan? Every person with diabetes is different, and each person has different needs for a meal plan. Your health care provider may recommend that you work with a dietitian to make a meal plan that is best for you. Your meal plan may vary depending on factors such as: The calories you need. The medicines you take. Your weight. Your blood glucose, blood pressure, and cholesterol levels. Your activity level. Other health conditions you have, such as heart or kidney disease. How do carbohydrates affect me? Carbohydrates, also called carbs, affect your blood glucose level more than any other type of food. Eating carbs raises the amount of glucose in your blood. It is important to know how many carbs you can safely have in each meal. This is different for every person. Your dietitian can help you calculate how many carbs you should have at each meal and for each snack. How does alcohol affect me? Alcohol can cause a decrease in blood glucose (hypoglycemia), especially if you use insulin or take certain diabetes medicines by mouth. Hypoglycemia can be a life-threatening condition. Symptoms of hypoglycemia, such as sleepiness, dizziness, and confusion, are similar to symptoms of having too much alcohol. Do not drink alcohol if: Your health care provider tells you not to drink. You are pregnant, may be pregnant, or are planning to become pregnant. If you drink  alcohol: Limit how much you have to: 0-1 drink a day for women. 0-2 drinks a day for men. Know how much alcohol is in your drink. In the [place] one drink equals one 12 oz bottle of beer (355 mL), one 5 oz glass of wine (148 mL), or one 1 oz glass of hard liquor (44 mL). Keep yourself hydrated with water, diet soda, or unsweetened iced tea. Keep in mind that regular soda, juice, and other mixers may contain a lot of sugar and must be counted as carbs. What are tips for following this plan?  Reading food labels Start by checking the serving size on the Nutrition Facts label of packaged foods and drinks. The number of calories and the amount of carbs, fats, and other nutrients listed on the label are based on one serving of the item. Many items contain more than one serving per package. Check the total grams (g) of carbs in one serving. Check the number of grams of saturated fats and trans fats in one serving. Choose foods that have a low amount or none of these fats. Check the number of milligrams (mg) of salt (sodium) in one serving. Most people should limit total sodium intake to less than 2,300 mg per day. Always check the nutrition information of foods labeled as "low-fat" or "nonfat." These foods may be higher in added sugar or refined carbs and should be avoided. Talk to your dietitian  to identify your daily goals for nutrients listed on the label. Shopping Avoid buying canned, pre-made, or processed foods. These foods tend to be high in fat, sodium, and added sugar. Shop around the outside edge of the grocery store. This is where you will most often find fresh fruits and vegetables, bulk grains, fresh meats, and fresh dairy products. Cooking Use low-heat cooking methods, such as baking, instead of high-heat cooking methods, such as deep frying. Cook using healthy oils, such as olive, canola, or sunflower oil. Avoid cooking with butter, cream, or high-fat meats. Meal planning Eat meals and  snacks regularly, preferably at the same times every day. Avoid going long periods of time without eating. Eat foods that are high in fiber, such as fresh fruits, vegetables, beans, and whole grains. Eat 4-6 oz (112-168 g) of lean protein each day, such as lean meat, chicken, fish, eggs, or tofu. One ounce (oz) (28 g) of lean protein is equal to: 1 oz (28 g) of meat, chicken, or fish. 1 egg.  cup (62 g) of tofu. Eat some foods each day that contain healthy fats, such as avocado, nuts, seeds, and fish. What foods should I eat? Fruits Berries. Apples. Oranges. Peaches. Apricots. Plums. Grapes. Mangoes. Papayas. Pomegranates. Kiwi. Cherries. Vegetables Leafy greens, including lettuce, spinach, kale, chard, collard greens, mustard greens, and cabbage. Beets. Cauliflower. Broccoli. Carrots. Green beans. Tomatoes. Peppers. Onions. Cucumbers. Brussels sprouts. Grains Whole grains, such as whole-wheat or whole-grain bread, crackers, tortillas, cereal, and pasta. Unsweetened oatmeal. Quinoa. Brown or wild rice. Meats and other proteins Seafood. Poultry without skin. Lean cuts of poultry and beef. Tofu. Nuts. Seeds. Dairy Low-fat or fat-free dairy products such as milk, yogurt, and cheese. The items listed above may not be a complete list of foods and beverages you can eat and drink. Contact a dietitian for more information. What foods should I avoid? Fruits Fruits canned with syrup. Vegetables Canned vegetables. Frozen vegetables with butter or cream sauce. Grains Refined white flour and flour products such as bread, pasta, snack foods, and cereals. Avoid all processed foods. Meats and other proteins Fatty cuts of meat. Poultry with skin. Breaded or fried meats. Processed meat. Avoid saturated fats. Dairy Full-fat yogurt, cheese, or milk. Beverages Sweetened drinks, such as soda or iced tea. The items listed above may not be a complete list of foods and beverages you should avoid. Contact a  dietitian for more information. Questions to ask a health care provider Do I need to meet with a certified diabetes care and education specialist? Do I need to meet with a dietitian? What number can I call if I have questions? When are the best times to check my blood glucose? Where to find more information: American Diabetes Association: diabetes.org Academy of Nutrition and Dietetics: eatright.[employer] of Diabetes and Digestive and Kidney Diseases: [employer] Association of Diabetes Care & Education Specialists: diabeteseducator.org Summary It is important to have healthy eating habits because your blood sugar (glucose) levels are greatly affected by what you eat and drink. It is important to use alcohol carefully. A healthy meal plan will help you manage your blood glucose and lower your risk of heart disease. Your health care provider may recommend that you work with a dietitian to make a meal plan that is best for you. This information is not intended to replace advice given to you by your health care provider. Make sure you discuss any questions you have with your health care provider. Document Revised: 06/03/2020 Document Reviewed:  06/03/2020 Elsevier Patient Education  [place], MD [facility]

## 2022-06-06 NOTE — Assessment & Plan Note (Signed)
Well-controlled hypertension.  Continue olmesartan 20 mg daily. Hemoglobin A1c today at 7.4.  Of Trulicity due to cost. We will start Jardiance 10 mg daily or Farxiga as per Medicare's formulary. Cardiovascular risk associated with hypertension and diabetes discussed. Diet and nutrition discussed. Follow-up in 3 months.

## 2022-06-06 NOTE — Patient Instructions (Signed)

## 2022-06-08 ENCOUNTER — Inpatient Hospital Stay: Payer: Medicare Other

## 2022-06-08 ENCOUNTER — Other Ambulatory Visit: Payer: Self-pay

## 2022-06-08 ENCOUNTER — Inpatient Hospital Stay: Payer: Medicare Other | Attending: Hematology | Admitting: Hematology

## 2022-06-08 VITALS — BP 127/42 | HR 63 | Temp 97.5°F | Resp 20 | Wt 297.0 lb

## 2022-06-08 DIAGNOSIS — C160 Malignant neoplasm of cardia: Secondary | ICD-10-CM

## 2022-06-08 DIAGNOSIS — Z8509 Personal history of malignant neoplasm of other digestive organs: Secondary | ICD-10-CM | POA: Diagnosis not present

## 2022-06-08 DIAGNOSIS — Z08 Encounter for follow-up examination after completed treatment for malignant neoplasm: Secondary | ICD-10-CM | POA: Diagnosis not present

## 2022-06-08 DIAGNOSIS — Z452 Encounter for adjustment and management of vascular access device: Secondary | ICD-10-CM | POA: Insufficient documentation

## 2022-06-08 DIAGNOSIS — Z95828 Presence of other vascular implants and grafts: Secondary | ICD-10-CM

## 2022-06-08 DIAGNOSIS — Z7189 Other specified counseling: Secondary | ICD-10-CM

## 2022-06-08 LAB — CBC WITH DIFFERENTIAL (CANCER CENTER ONLY)
Abs Immature Granulocytes: 0.03 10*3/uL (ref 0.00–0.07)
Basophils Absolute: 0 10*3/uL (ref 0.0–0.1)
Basophils Relative: 1 %
Eosinophils Absolute: 0.1 10*3/uL (ref 0.0–0.5)
Eosinophils Relative: 1 %
HCT: 35.4 % — ABNORMAL LOW (ref 39.0–52.0)
Hemoglobin: 11.9 g/dL — ABNORMAL LOW (ref 13.0–17.0)
Immature Granulocytes: 1 %
Lymphocytes Relative: 19 %
Lymphs Abs: 1.1 10*3/uL (ref 0.7–4.0)
MCH: 28.7 pg (ref 26.0–34.0)
MCHC: 33.6 g/dL (ref 30.0–36.0)
MCV: 85.5 fL (ref 80.0–100.0)
Monocytes Absolute: 0.4 10*3/uL (ref 0.1–1.0)
Monocytes Relative: 8 %
Neutro Abs: 4.2 10*3/uL (ref 1.7–7.7)
Neutrophils Relative %: 70 %
Platelet Count: 139 10*3/uL — ABNORMAL LOW (ref 150–400)
RBC: 4.14 MIL/uL — ABNORMAL LOW (ref 4.22–5.81)
RDW: 13.7 % (ref 11.5–15.5)
WBC Count: 5.8 10*3/uL (ref 4.0–10.5)
nRBC: 0 % (ref 0.0–0.2)

## 2022-06-08 LAB — CMP (CANCER CENTER ONLY)
ALT: 23 U/L (ref 0–44)
AST: 21 U/L (ref 15–41)
Albumin: 3.8 g/dL (ref 3.5–5.0)
Alkaline Phosphatase: 61 U/L (ref 38–126)
Anion gap: 5 (ref 5–15)
BUN: 15 mg/dL (ref 8–23)
CO2: 26 mmol/L (ref 22–32)
Calcium: 9.4 mg/dL (ref 8.9–10.3)
Chloride: 110 mmol/L (ref 98–111)
Creatinine: 1.27 mg/dL — ABNORMAL HIGH (ref 0.61–1.24)
GFR, Estimated: >60 mL/min (ref >60)
Glucose, Bld: 175 mg/dL — ABNORMAL HIGH (ref 70–99)
Potassium: 4.9 mmol/L (ref 3.5–5.1)
Sodium: 141 mmol/L (ref 135–145)
Total Bilirubin: 0.4 mg/dL (ref 0.3–1.2)
Total Protein: 7.5 g/dL (ref 6.5–8.1)

## 2022-06-08 MED ORDER — HEPARIN SOD (PORK) LOCK FLUSH 100 UNIT/ML IV SOLN
500.0000 [IU] | Freq: Once | INTRAVENOUS | Status: AC | PRN
  Administered 2022-06-08: 500 [IU] via INTRAVENOUS

## 2022-06-08 MED ORDER — SODIUM CHLORIDE 0.9% FLUSH
10.0000 mL | INTRAVENOUS | Status: DC | PRN
  Administered 2022-06-08: 10 mL via INTRAVENOUS

## 2022-06-09 LAB — CEA (IN HOUSE-CHCC): CEA (CHCC-In House): 3.88 ng/mL (ref 0.00–5.00)

## 2022-06-13 ENCOUNTER — Telehealth: Payer: Self-pay | Admitting: Hematology

## 2022-06-13 NOTE — Telephone Encounter (Signed)
Scheduled follow-up appointments per 7/26 los. Patient is aware. 

## 2022-06-15 ENCOUNTER — Encounter: Payer: Self-pay | Admitting: Hematology

## 2022-06-15 NOTE — Progress Notes (Addendum)
HEMATOLOGY/ONCOLOGY CLINIC NOTE  Date of Service: .06/08/2022  Patient Care Team: Horald Pollen, MD as PCP - General (Internal Medicine) [name] (Surgery)    CHIEF COMPLAINTS/PURPOSE OF CONSULTATION:   Follow-up for continued valuation and management of gastric adenocarcinoma  HISTORY OF PRESENTING ILLNESS:   [name] is a wonderful 71 y.o. male is here for continued evaluation and management of recently diagnosed pT4a, pN3b, pM1 Gastric Adenocarcinoma.   Patient was previously seen in the hospital on 01/19/2017 with a diagnosis of gastric adenocarcinoma in the gastric pylorus causing significant intrinsic stenosis . Patient had presented to primary care physician with dyspeptic symptoms, partial gastric outlet obstruction and weight loss of 60 pounds. Patient has had a distal gastrectomy, cholecystectomy with jejunal feeding tube placement by Dr. Barry Dienes on 01/10/2017 for gastric obstruction secondary to adenocarcinoma of the gastric pylorus. He was noted to have invaded into the pancreas and a bit of the anterior pancreatic head was resected en bloc with the tumor.   Pathology showed  " INVASIVE MODERATELY TO POORLY DIFFERENTIATED ADENOCARCINOMA, SPANNING 4 CM IN GREATEST DIMENSION. TUMOR INVADES THROUGH STOMACH WALL TO INVOLVE SEROSAL SURFACE.  LYMPH/VASCULAR INVASION IS IDENTIFIED.  MARGINS ARE NEGATIVE.- FIFTEEN OF THIRTY NINE LYMPH NODES POSITIVE FOR METASTATIC ADENOCARCINOMA (15/39). EXTRACAPSULAR EXTENSION IS IDENTIFIED. 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  Patient had a prolonged hospitalization post surgery  due to significant NG tube output. Needed epidural and PCA for pain control. It took a while for him to be able to tolerate progression of 2 feeding. He had an abdominal drain due to a small collection near his pancreatic head. Was treated with Augmentin for  this. Patient was in the hospital from 01/10/2017 and was eventually discharged on 01/27/2017.  Patient postponed several attempts at posthospitalization follow-up. He has been recovering at home and has been optimizing his tube feeding. Patient was recently followed up by Dr. Barry Dienes and has had a port placement on 02/23/2017 and preparation for possible chemotherapy.  He notes that his tube feeding is up to 40 mL per hour for 12 hours and has targeted is 16 ml per hour. He was recently admitted to [facility] in [place] for a C. difficile colitis that caused a severe diarrhea. he is currently on oral vancomycin has completed about 5 days of treatment and is also on probiotics.  CURRENT THERAPY: Active surveillance  PREVIOUS THERAPY:  Concurrent Chemoradiation with Xeloda. He started 07/18/17 and completed 08/25/17  INTERVAL HISTORY   [name] is here for continued evaluation and management of gastric adenocarcinoma. He notes no acute new symptoms since his last visit 6 months ago. No abdominal pain.  No abdominal distention.  No change in bowel habits.  No evidence of GI bleeding. No unexpected sudden weight loss. Labs done today were discussed in detail with the patient  MEDICAL HISTORY:  Past Medical History:  Diagnosis Date   Anemia    Cancer ([place]) dx'd 11/2016   Gastric Adenocarcinoma   Gastric outlet obstruction 01/2017   GERD (gastroesophageal reflux disease)    Pre-diabetes    denies   SVT (supraventricular tachycardia) ([place])    during Admission and Surgery- 01/2017    SURGICAL HISTORY: Past Surgical History:  Procedure Laterality Date   CHOLECYSTECTOMY N/A 01/10/2017   Procedure: CHOLECYSTECTOMY;  Surgeon: Stark Klein, MD;  Location: [facility];  Service: General;  Laterality: N/A;   COLONOSCOPY     COLONOSCOPY  10/11/2011   Procedure: COLONOSCOPY;  Surgeon: Daneil Dolin, MD;  [facility];  Service: Endoscopy;  Laterality: N/A;  10:30 AM    ESOPHAGOGASTRODUODENOSCOPY N/A 12/30/2016   Procedure: ESOPHAGOGASTRODUODENOSCOPY (EGD);  Surgeon: Carol Ada, MD;  Location: [facility];  Service: Endoscopy;  Laterality: N/A;   GASTRECTOMY N/A 01/10/2017   Procedure: DISTAL GASTRECTOMY, JEJUNUM  FEEDING TUBE PLACEMENT;  Surgeon: Stark Klein, MD;  Location: [facility];  Service: General;  Laterality: N/A;   IR GENERIC HISTORICAL  01/24/2017   IR [employer] DUODEN/JEJUNO TUBE PERCUT W/FLUORO 01/24/2017 Aletta Edouard, MD [facility]INTER[facility]   IR [facility] DUODEN/JEJUNO TUBE PERCUT W/FLUORO  03/26/2017   PORTACATH PLACEMENT N/A 02/23/2017   Procedure: INSERTION PORT-A-CATH;  Surgeon: Stark Klein, MD;  Location: [facility];  Service: General;  Laterality: N/A;   testicle removed  1982    SOCIAL HISTORY: Social History   Socioeconomic History   Marital status: Married    Spouse name: Not on file   Number of children: Not on file   Years of education: Not on file   Highest education level: Not on file  Occupational History   Not on file  Tobacco Use   Smoking status: Never   Smokeless tobacco: Never  Vaping Use   Vaping Use: Unknown  Substance and Sexual Activity   Alcohol use: No    Comment: occ beer or wine but none in last 6 months    Drug use: No   Sexual activity: Not on file  Other Topics Concern   Not on file  Social History Narrative   Not on file   Social Determinants of Health   Financial Resource Strain: Not on file  Food Insecurity: Not on file  Transportation Needs: Not on file  Physical Activity: Not on file  Stress: Not on file  Social Connections: Not on file  Intimate Partner Violence: Not on file    FAMILY HISTORY: Family History  Problem Relation Age of Onset   Prostate cancer Father    Colon cancer Neg Hx     ALLERGIES:  is allergic to no known allergies.  MEDICATIONS:  Current Outpatient Medications  Medication Sig Dispense Refill   Blood Glucose Calibration (ACCU-CHEK AVIVA) SOLN 1 Act by In Vitro route daily. 1  each 2   Blood Glucose Monitoring Suppl (ACCU-CHEK AVIVA PLUS) w/Device KIT 1 Act by Does not apply route 4 (four) times daily -  with meals and at bedtime. 1 kit 3   Continuous Blood Gluc Receiver (FREESTYLE LIBRE 2 READER) DEVI 1 Act by Does not apply route daily. 2 each 5   Continuous Blood Gluc Sensor (FREESTYLE LIBRE 2 SENSOR) MISC 1 Act by Does not apply route daily. 2 each 5   Dulaglutide (TRULICITY) 0.86 VH/8.4ON SOPN Inject 0.75 mg into the skin once a week. 2 mL 6   empagliflozin (JARDIANCE) 10 MG TABS tablet Take 1 tablet (10 mg total) by mouth daily before breakfast. 90 tablet 3   ferrous sulfate 325 (65 FE) MG tablet Take 325 mg by mouth daily with breakfast.     glucose blood (ACCU-CHEK AVIVA PLUS) test strip 1 each by Other route with breakfast, with lunch, and with evening meal. Use as instructed 300 strip 1   Insulin Pen Needle 32G X 6 MM MISC 1 Act by Does not apply route 4 (four) times daily. 400 each 1   ketoconazole (NIZORAL) 2 % cream Apply 1 application topically daily. 30 g 5   Multiple Vitamin (  MULTIVITAMIN) tablet Take 1 tablet by mouth daily.     olmesartan (BENICAR) 20 MG tablet Take 1 tablet (20 mg total) by mouth daily. 90 tablet 3   rosuvastatin (CRESTOR) 10 MG tablet Take 1 tablet (10 mg total) by mouth daily. 90 tablet 3   No current facility-administered medications for this visit.    10 Point review of Systems was done is negative except as noted above.  PHYSICAL EXAMINATION:  .BP (!) 127/42   Pulse 63   Temp (!) 97.5 F (36.4 C)   Resp 20   Wt 297 lb (134.7 kg)   SpO2 100%   BMI 41.42 kg/m  NAD GENERAL:alert, in no acute distress and comfortable SKIN: no acute rashes, no significant lesions EYES: conjunctiva are pink and non-injected, sclera anicteric OROPHARYNX: MMM, no exudates, no oropharyngeal erythema or ulceration NECK: supple, no JVD LYMPH:  no palpable lymphadenopathy in the cervical, axillary or inguinal regions LUNGS: clear to  auscultation b/l with normal respiratory effort HEART: regular rate & rhythm ABDOMEN:  normoactive bowel sounds , non tender, not distended. Extremity: no pedal edema PSYCH: alert & oriented x 3 with fluent speech NEURO: no focal motor/sensory deficits  LABORATORY DATA:  I have reviewed the data as listed     Latest Ref Rng & Units 06/08/2022    2:32 PM 12/21/2021    2:31 PM 07/06/2021    2:00 PM  CBC  WBC 4.0 - 10.5 K/uL 5.8  5.3  4.2   Hemoglobin 13.0 - 17.0 g/dL 11.9  11.6  11.7   Hematocrit 39.0 - 52.0 % 35.4  35.5  35.7   Platelets 150 - 400 K/uL 139  158  162        Latest Ref Rng & Units 06/08/2022    2:32 PM 06/06/2022    3:05 PM 12/21/2021    2:31 PM  CMP  Glucose 70 - 99 mg/dL 175  135  139   BUN 8 - 23 mg/dL _0 Creatinine 0.61 - 1.24 mg/dL 1.27  1.04  1.01   Sodium 135 - 145 mmol/L 141  139  138   Potassium 3.5 - 5.1 mmol/L 4.9  4.4  4.3   Chloride 98 - 111 mmol/L 110  105  105   CO2 22 - 32 mmol/L _1 Calcium 8.9 - 10.3 mg/dL 9.4  9.0  9.1   Total Protein 6.5 - 8.1 g/dL 7.5  7.1  7.4   Total Bilirubin 0.3 - 1.2 mg/dL 0.4  0.6  0.5   Alkaline Phos 38 - 126 U/L 61  60  55   AST 15 - 41 U/L _2 ALT 0 - 44 U/L _3 RADIOGRAPHIC STUDIES: I have personally reviewed the radiological images as listed and agreed with the findings in the report. No results found.   ASSESSMENT & PLAN:   71 y.o.  African-American male with   1) Resected Stage IV pT4a, pN3b, pM1 invasive moderately-poorly Gastric Adenocarcinoma . Her 2 neg by FISH LVI + margins neg 15/39 LN +ve, Extracapsular invasion +, some involvement of pancreatic capsule there pM1 Presented with severe pyloric stenosis with ulceration. S/p 60 pound weight loss- now resolved. S/p distal gastrectomy, cholecystectomy and en bloc dissection off the small part of the pancreatic head  with jejunal feeding tube placement. Initial CT abdomen and pelvis did  not show any local  regional lymphadenopathy. Rpt CT chest/abd/pelvis from 03/22/2017 - no overt evidence of residual disease or metastatic disease. Post-surgical peripancreatic fluid collection resolving. CT chest/abd/pelvis 06/21/2017 after 6 cycles of FOLFOX -  No evidence of residual or metastatic carcinoma within the chest, abdomen, or pelvis -Started Chemoradiation on 07/18/17 with Xeloda (859m/m2 twice daily Monday through Friday while on radiation) and completed treatment on 08/24/2017. CT chest/abd/pelvis (11/16/2017) - show radiation related changes but no evidence of tumor progression at this time.  07/13/18 CT C/A/P revealed No evidence of metastatic disease in the chest, abdomen or pelvis. Previously described mildly enlarged high left para-aortic retroperitoneal node is decreased and now normal in size. Previously described peripancreatic fat stranding is decreased and probably treatment related.   03/11/19 CT A/P revealed "Status post distal gastrectomy, without findings to suggest local recurrence of disease or definite metastatic disease in the abdomen or pelvis. 2. Colonic diverticulosis without evidence of acute diverticulitis at this time. 3. Incidental findings, as above, similar to prior studies."  09/30/2019 CT C/A/P [phone]) revealed "1. Status post distal gastrectomy with gastrojejunostomy. No specific findings identified to suggest local tumor recurrence or metastatic disease. 2. Aortic atherosclerosis. Coronary artery calcifications noted. Aortic Atherosclerosis (ICD10-I70.0)."  #2 status post port placement on 02/23/2017 by Dr. BBarry Dienes  #3 h/o Clostridium difficile colitis -. No overt issues with diarrhea currently.   #4 suspected sleep apnea. Has not had a sleep study. Wife notes significant snoring which is important with weight loss.  Will have to be careful with excessive sedation. -continue prn low dose trazodone for insomnia -recommended he f/u with his PCP for consideration  of sleep study-pending   PLAN: -Discussed labs with the patient that were done today. CBC stable with a hemoglobin of 11.9 normal WBC count and platelets of 139k CMP stable CEA stable at 3.88 -Last CT chest abdomen pelvis from February 2023 showed no evidence of metastatic disease in the chest abdomen or pelvis. -Patient has no lab, clinical or radiographic evidence of recurrence of his gastric adenocarcinoma. We shall see him back in 6 months with repeat labs unless he has any new symptoms in the interim.   FOLLOW UP: Portflush every 2 months RTC with Dr KIrene Limbowith portlfush and labs in 6 months  The total time spent in the appointment was 20 minutes*.  All of the patient's questions were answered with apparent satisfaction. The patient knows to call the clinic with any problems, questions or concerns.   GSullivan LoneMD MS AAHIVMS SWellington Edoscopy CenterCMenorah Medical CenterHematology/Oncology Physician [facility] .*Total Encounter Time as defined by the Centers for Medicare and Medicaid Services includes, in addition to the face-to-face time of a patient visit (documented in the note above) non-face-to-face time: obtaining and reviewing outside history, ordering and reviewing medications, tests or procedures, care coordination (communications with other health care professionals or caregivers) and documentation in the medical record.

## 2022-08-09 ENCOUNTER — Inpatient Hospital Stay: Payer: Medicare Other | Attending: Hematology

## 2022-08-09 ENCOUNTER — Other Ambulatory Visit: Payer: Self-pay

## 2022-08-09 DIAGNOSIS — Z452 Encounter for adjustment and management of vascular access device: Secondary | ICD-10-CM | POA: Diagnosis not present

## 2022-08-09 DIAGNOSIS — Z8509 Personal history of malignant neoplasm of other digestive organs: Secondary | ICD-10-CM | POA: Diagnosis not present

## 2022-08-09 DIAGNOSIS — Z95828 Presence of other vascular implants and grafts: Secondary | ICD-10-CM

## 2022-08-09 DIAGNOSIS — Z7189 Other specified counseling: Secondary | ICD-10-CM

## 2022-08-09 DIAGNOSIS — C160 Malignant neoplasm of cardia: Secondary | ICD-10-CM

## 2022-08-09 MED ORDER — SODIUM CHLORIDE 0.9% FLUSH
10.0000 mL | INTRAVENOUS | Status: DC | PRN
  Administered 2022-08-09: 10 mL via INTRAVENOUS

## 2022-08-09 MED ORDER — HEPARIN SOD (PORK) LOCK FLUSH 100 UNIT/ML IV SOLN
500.0000 [IU] | Freq: Once | INTRAVENOUS | Status: AC | PRN
  Administered 2022-08-09: 500 [IU] via INTRAVENOUS

## 2022-08-30 ENCOUNTER — Ambulatory Visit: Payer: Medicare Other | Admitting: Emergency Medicine

## 2022-09-06 ENCOUNTER — Ambulatory Visit (INDEPENDENT_AMBULATORY_CARE_PROVIDER_SITE_OTHER): Payer: Medicare Other | Admitting: Emergency Medicine

## 2022-09-06 ENCOUNTER — Encounter: Payer: Self-pay | Admitting: Emergency Medicine

## 2022-09-06 VITALS — BP 126/74 | HR 67 | Temp 98.2°F | Ht 71.0 in | Wt 292.2 lb

## 2022-09-06 DIAGNOSIS — E785 Hyperlipidemia, unspecified: Secondary | ICD-10-CM | POA: Diagnosis not present

## 2022-09-06 DIAGNOSIS — I152 Hypertension secondary to endocrine disorders: Secondary | ICD-10-CM

## 2022-09-06 DIAGNOSIS — E1159 Type 2 diabetes mellitus with other circulatory complications: Secondary | ICD-10-CM

## 2022-09-06 DIAGNOSIS — E1165 Type 2 diabetes mellitus with hyperglycemia: Secondary | ICD-10-CM

## 2022-09-06 DIAGNOSIS — E1169 Type 2 diabetes mellitus with other specified complication: Secondary | ICD-10-CM | POA: Diagnosis not present

## 2022-09-06 DIAGNOSIS — Z1211 Encounter for screening for malignant neoplasm of colon: Secondary | ICD-10-CM | POA: Diagnosis not present

## 2022-09-06 DIAGNOSIS — Z23 Encounter for immunization: Secondary | ICD-10-CM | POA: Diagnosis not present

## 2022-09-06 LAB — POCT GLYCOSYLATED HEMOGLOBIN (HGB A1C): Hemoglobin A1C: 7.1 % — AB (ref 4.0–5.6)

## 2022-09-06 MED ORDER — TRULICITY 0.75 MG/0.5ML ~~LOC~~ SOAJ: 0.7500 mg | SUBCUTANEOUS | 6 refills | Status: DC

## 2022-09-06 NOTE — Assessment & Plan Note (Signed)
Well-controlled hypertension. Continue olmesartan 20 mg daily BP Readings from Last 3 Encounters:  09/06/22 126/74  06/08/22 (!) 127/42  06/06/22 140/78  Well-controlled diabetes with hemoglobin A1c at 7.1. Continue weekly Trulicity 5.05 mg Cardiovascular risks associated with diabetes and hypertension discussed. Diet and nutrition discussed. Eating better and exercising more.

## 2022-09-06 NOTE — Patient Instructions (Signed)

## 2022-09-06 NOTE — Progress Notes (Signed)
[name] 71 y.o.   Chief Complaint  Patient presents with   Follow-up    3 mnth f/u appt, dizziness x 1 month, patient states when he is sitting.     HISTORY OF PRESENT ILLNESS: This is a 71 y.o. male here for 28-monthfollow-up of diabetes and hypertension Overall doing well. [place] side effects [place]  Went back to weekly Trulicity.  Has occasional bouts of dizziness and nausea most likely related to Trulicity use. No other complaints or medical concerns today.  HPI   Prior to Admission medications   Medication Sig Start Date End Date Taking? Authorizing Provider  Dulaglutide (TRULICITY) 00.94MBS/9.6GESOPN Inject 0.75 mg into the skin once a week. 11/01/21  Yes Anelly Samarin, MInes Bloomer MD  ferrous sulfate 325 (65 FE) MG tablet Take 325 mg by mouth daily with breakfast.   Yes [provider]  ketoconazole (NIZORAL) 2 % cream Apply 1 application topically daily. 11/01/21  Yes Danalee Flath, MInes Bloomer MD  Multiple Vitamin (MULTIVITAMIN) tablet Take 1 tablet by mouth daily.   Yes [provider]  olmesartan (BENICAR) 20 MG tablet Take 1 tablet (20 mg total) by mouth daily. 06/06/22  Yes Teaira Croft, MInes Bloomer MD  rosuvastatin (CRESTOR) 10 MG tablet Take 1 tablet (10 mg total) by mouth daily. 06/06/22  Yes SHorald Pollen MD    Allergies  Allergen Reactions   No Known Allergies     Patient Active Problem List   Diagnosis Date Noted   Diabetic polyneuropathy associated with type 2 diabetes mellitus ([place] 06/15/2021   Hyperlipidemia with target LDL less than 100 06/15/2021   Type II diabetes mellitus with manifestations ([facility] 06/15/2021   Numbness and tingling of both feet 06/15/2021   Hypertension associated with diabetes ([facility] 06/15/2021   Abnormal electrocardiogram (ECG) (EKG) 06/15/2021   Counseling regarding advance care planning and goals of care 08/13/2018   Port catheter in place 08/03/2017   GERD (gastroesophageal reflux disease)  03/25/2017   Anemia 03/25/2017   NSVT (nonsustained ventricular tachycardia) (HCC)    Malignant neoplasm of cardia of stomach ([facility] 01/10/2017    Past Medical History:  Diagnosis Date   Anemia    Cancer ([place] dx[place] 11/2016   Gastric Adenocarcinoma   Gastric outlet obstruction 01/2017   GERD (gastroesophageal reflux disease)    Pre-diabetes    denies   SVT (supraventricular tachycardia)    during Admission and Surgery- 01/2017    Past Surgical History:  Procedure Laterality Date   CHOLECYSTECTOMY N/A 01/10/2017   Procedure: CHOLECYSTECTOMY;  Surgeon: FStark Klein MD;  [place] [facility]  Service: General;  Laterality: N/A;   COLONOSCOPY     COLONOSCOPY  10/11/2011   Procedure: COLONOSCOPY;  Surgeon: RDaneil Dolin MD;  [facility];  Service: Endoscopy;  Laterality: N/A;  10:30 AM   ESOPHAGOGASTRODUODENOSCOPY N/A 12/30/2016   Procedure: ESOPHAGOGASTRODUODENOSCOPY (EGD);  Surgeon: PCarol Ada MD;  [facility];  Service: Endoscopy;  Laterality: N/A;   GASTRECTOMY N/A 01/10/2017   Procedure: DISTAL GASTRECTOMY, JEJUNUM  FEEDING TUBE PLACEMENT;  Surgeon: FStark Klein MD;  [place] [facility]  Service: General;  Laterality: N/A;   IR GENERIC HISTORICAL  01/24/2017   IR [place]/JEJUNO TUBE PERCUT W/FLUORO 01/24/2017 GAletta Edouard MD [facility]   [facility] W/FLUORO  03/26/2017   PORTACATH PLACEMENT N/A 02/23/2017   Procedure: INSERTION PORT-A-CATH;  Surgeon: FStark Klein MD;  [place]  Service: General;  Laterality: N/A;   testicle  removed  1982    Social History   Socioeconomic History   Marital status: Married    Spouse name: Not on file   Number of children: Not on file   Years of education: Not on file   Highest education level: Not on file  Occupational History   Not on file  Tobacco Use   Smoking status: Never   Smokeless tobacco: Never  Vaping Use   Vaping Use: Unknown  Substance and Sexual Activity   Alcohol  use: No    Comment: occ beer or wine but none in last 6 months    Drug use: No   Sexual activity: Not on file  Other Topics Concern   Not on file  Social History Narrative   Not on file   Social Determinants of Health   Financial Resource Strain: Not on file  Food Insecurity: Not on file  Transportation Needs: Not on file  Physical Activity: Not on file  Stress: Not on file  Social Connections: Not on file  Intimate Partner Violence: Not on file    Family History  Problem Relation Age of Onset   Prostate cancer Father    Colon cancer Neg Hx      Review of Systems  Constitutional: Negative.  Negative for chills and fever.  HENT: Negative.  Negative for congestion and sore throat.   Respiratory: Negative.  Negative for cough and shortness of breath.   Cardiovascular: Negative.  Negative for chest pain and palpitations.  Gastrointestinal:  Positive for nausea. Negative for abdominal pain, diarrhea and vomiting.  Genitourinary: Negative.  Negative for dysuria and hematuria.  Skin: Negative.  Negative for rash.  Neurological:  Positive for dizziness. Negative for headaches.  All other systems reviewed and are negative.   Today's Vitals   09/06/22 1420  BP: 126/74  Pulse: 67  Temp: 98.2 F (36.8 C)  TempSrc: Oral  SpO2: 94%  Weight: 292 lb 4 oz (132.6 kg)  Height: '5\' 11"'$  (1.803 m)   Body mass index is 40.76 kg/m. Wt Readings from Last 3 Encounters:  09/06/22 292 lb 4 oz (132.6 kg)  06/08/22 297 lb (134.7 kg)  06/06/22 296 lb 8 oz (134.5 kg)    Physical Exam Vitals reviewed.  Constitutional:      Appearance: Normal appearance.  HENT:     Head: Normocephalic.     Mouth/Throat:     Mouth: Mucous membranes are moist.     Pharynx: Oropharynx is clear.  Eyes:     Extraocular Movements: Extraocular movements intact.     Pupils: Pupils are equal, round, and reactive to light.  Cardiovascular:     Rate and Rhythm: Normal rate and regular rhythm.     Pulses:  Normal pulses.     Heart sounds: Normal heart sounds.  Pulmonary:     Effort: Pulmonary effort is normal.     Breath sounds: Normal breath sounds.  Musculoskeletal:     Cervical back: No tenderness.  Lymphadenopathy:     Cervical: No cervical adenopathy.  Skin:    General: Skin is warm and dry.     Capillary Refill: Capillary refill takes less than 2 seconds.  Neurological:     General: No focal deficit present.     Mental Status: He is alert and oriented to person, place, and time.  Psychiatric:        Mood and Affect: Mood normal.        Behavior: Behavior normal.  Results for orders placed or performed in visit on 09/06/22 (from the past 24 hour(s))  POCT HgB A1C     Status: Abnormal   Collection Time: 09/06/22  2:32 PM  Result Value Ref Range   Hemoglobin A1C 7.1 (A) 4.0 - 5.6 %   HbA1c POC (<> result, manual entry)     HbA1c, POC (prediabetic range)     HbA1c, POC (controlled diabetic range)      ASSESSMENT & PLAN: A total of 47 minutes was spent with the patient and counseling/coordination of care regarding preparing for this visit, review of most recent office visit note, review of multiple chronic medical conditions and their management, review of all medications, cardiovascular risks associated with hypertension and diabetes, education on nutrition, review of most recent blood work results including interpretation of today's hemoglobin A1c, prognosis, documentation, and need for follow-up.  Problem List Items Addressed This Visit       Cardiovascular and Mediastinum   Hypertension associated with diabetes ([place]) - Primary    Well-controlled hypertension. Continue olmesartan 20 mg daily BP Readings from Last 3 Encounters:  09/06/22 126/74  06/08/22 (!) 127/42  06/06/22 140/78  Well-controlled diabetes with hemoglobin A1c at 7.1. Continue weekly Trulicity 8.10 mg Cardiovascular risks associated with diabetes and hypertension discussed. Diet and nutrition  discussed. Eating better and exercising more.       Relevant Medications   Dulaglutide (TRULICITY) 1.75 ZW/2.5EN SOPN     Endocrine   Dyslipidemia associated with type 2 diabetes mellitus (HCC)    Stable.  Diet and nutrition discussed. Continue rosuvastatin 10 mg daily.       Relevant Medications   Dulaglutide (TRULICITY) 2.77 OE/4.2PN SOPN   Other Relevant Orders   POCT HgB A1C (Completed)   Other Visit Diagnoses     Need for vaccination       Relevant Orders   Flu Vaccine QUAD High Dose(Fluad) (Completed)   Type 2 diabetes mellitus with hyperglycemia, without long-term current use of insulin (HCC)       Relevant Medications   Dulaglutide (TRULICITY) 3.61 WE/3.1VQ SOPN   Colon cancer screening       Relevant Orders   Ambulatory referral to Gastroenterology      Patient Instructions  Diabetes Mellitus and Nutrition, Adult When you have diabetes, or diabetes mellitus, it is very important to have healthy eating habits because your blood sugar (glucose) levels are greatly affected by what you eat and drink. Eating healthy foods in the right amounts, at about the same times every day, can help you: Manage your blood glucose. Lower your risk of heart disease. Improve your blood pressure. Reach or maintain a healthy weight. What can affect my meal plan? Every person with diabetes is different, and each person has different needs for a meal plan. Your health care provider may recommend that you work with a dietitian to make a meal plan that is best for you. Your meal plan may vary depending on factors such as: The calories you need. The medicines you take. Your weight. Your blood glucose, blood pressure, and cholesterol levels. Your activity level. Other health conditions you have, such as heart or kidney disease. How do carbohydrates affect me? Carbohydrates, also called carbs, affect your blood glucose level more than any other type of food. Eating carbs raises the  amount of glucose in your blood. It is important to know how many carbs you can safely have in each meal. This is different for every person. Your dietitian  can help you calculate how many carbs you should have at each meal and for each snack. How does alcohol affect me? Alcohol can cause a decrease in blood glucose (hypoglycemia), especially if you use insulin or take certain diabetes medicines by mouth. Hypoglycemia can be a life-threatening condition. Symptoms of hypoglycemia, such as sleepiness, dizziness, and confusion, are similar to symptoms of having too much alcohol. Do not drink alcohol if: Your health care provider tells you not to drink. You are pregnant, may be pregnant, or are planning to become pregnant. If you drink alcohol: Limit how much you have to: 0-1 drink a day for women. 0-2 drinks a day for men. Know how much alcohol is in your drink. In the [place] one drink equals one 12 oz bottle of beer (355 mL), one 5 oz glass of wine (148 mL), or one 1 oz glass of hard liquor (44 mL). Keep yourself hydrated with water, diet soda, or unsweetened iced tea. Keep in mind that regular soda, juice, and other mixers may contain a lot of sugar and must be counted as carbs. What are tips for following this plan?  Reading food labels Start by checking the serving size on the Nutrition Facts label of packaged foods and drinks. The number of calories and the amount of carbs, fats, and other nutrients listed on the label are based on one serving of the item. Many items contain more than one serving per package. Check the total grams (g) of carbs in one serving. Check the number of grams of saturated fats and trans fats in one serving. Choose foods that have a low amount or none of these fats. Check the number of milligrams (mg) of salt (sodium) in one serving. Most people should limit total sodium intake to less than 2,300 mg per day. Always check the nutrition information of foods labeled as  "low-fat" or "nonfat." These foods may be higher in added sugar or refined carbs and should be avoided. Talk to your dietitian to identify your daily goals for nutrients listed on the label. Shopping Avoid buying canned, pre-made, or processed foods. These foods tend to be high in fat, sodium, and added sugar. Shop around the outside edge of the grocery store. This is where you will most often find fresh fruits and vegetables, bulk grains, fresh meats, and fresh dairy products. Cooking Use low-heat cooking methods, such as baking, instead of high-heat cooking methods, such as deep frying. Cook using healthy oils, such as olive, canola, or sunflower oil. Avoid cooking with butter, cream, or high-fat meats. Meal planning Eat meals and snacks regularly, preferably at the same times every day. Avoid going long periods of time without eating. Eat foods that are high in fiber, such as fresh fruits, vegetables, beans, and whole grains. Eat 4-6 oz (112-168 g) of lean protein each day, such as lean meat, chicken, fish, eggs, or tofu. One ounce (oz) (28 g) of lean protein is equal to: 1 oz (28 g) of meat, chicken, or fish. 1 egg.  cup (62 g) of tofu. Eat some foods each day that contain healthy fats, such as avocado, nuts, seeds, and fish. What foods should I eat? Fruits Berries. Apples. Oranges. Peaches. Apricots. Plums. Grapes. Mangoes. Papayas. Pomegranates. Kiwi. Cherries. Vegetables Leafy greens, including lettuce, spinach, kale, chard, collard greens, mustard greens, and cabbage. Beets. Cauliflower. Broccoli. Carrots. Green beans. Tomatoes. Peppers. Onions. Cucumbers. Brussels sprouts. Grains Whole grains, such as whole-wheat or whole-grain bread, crackers, tortillas, cereal, and pasta. Unsweetened oatmeal.  Quinoa. Brown or wild rice. Meats and other proteins Seafood. Poultry without skin. Lean cuts of poultry and beef. Tofu. Nuts. Seeds. Dairy Low-fat or fat-free dairy products such as milk,  yogurt, and cheese. The items listed above may not be a complete list of foods and beverages you can eat and drink. Contact a dietitian for more information. What foods should I avoid? Fruits Fruits canned with syrup. Vegetables Canned vegetables. Frozen vegetables with butter or cream sauce. Grains Refined white flour and flour products such as bread, pasta, snack foods, and cereals. Avoid all processed foods. Meats and other proteins Fatty cuts of meat. Poultry with skin. Breaded or fried meats. Processed meat. Avoid saturated fats. Dairy Full-fat yogurt, cheese, or milk. Beverages Sweetened drinks, such as soda or iced tea. The items listed above may not be a complete list of foods and beverages you should avoid. Contact a dietitian for more information. Questions to ask a health care provider Do I need to meet with a certified diabetes care and education specialist? Do I need to meet with a dietitian? What number can I call if I have questions? When are the best times to check my blood glucose? Where to find more information: American Diabetes Association: diabetes.org Academy of Nutrition and Dietetics: eatright.[employer] of Diabetes and Digestive and Kidney Diseases: [employer]is Association of Diabetes Care & Education Specialists: diabeteseducator.org Summary It is important to have healthy eating habits because your blood sugar (glucose) levels are greatly affected by what you eat and drink. It is important to use alcohol carefully. A healthy meal plan will help you manage your blood glucose and lower your risk of heart disease. Your health care provider may recommend that you work with a dietitian to make a meal plan that is best for you. This information is not intended to replace advice given to you by your health care provider. Make sure you discuss any questions you have with your health care provider. Document Revised: 06/03/2020 Document Reviewed:  06/03/2020 Elsevier Patient Education  [place], MD [facility]

## 2022-09-06 NOTE — Assessment & Plan Note (Addendum)
Stable.  Diet and nutrition discussed.  Continue rosuvastatin 10 mg daily.  

## 2022-10-10 ENCOUNTER — Inpatient Hospital Stay: Payer: Medicare Other | Attending: Hematology

## 2022-10-10 ENCOUNTER — Other Ambulatory Visit: Payer: Self-pay

## 2022-10-10 DIAGNOSIS — Z95828 Presence of other vascular implants and grafts: Secondary | ICD-10-CM

## 2022-10-10 DIAGNOSIS — Z7189 Other specified counseling: Secondary | ICD-10-CM

## 2022-10-10 DIAGNOSIS — Z8509 Personal history of malignant neoplasm of other digestive organs: Secondary | ICD-10-CM | POA: Insufficient documentation

## 2022-10-10 DIAGNOSIS — C160 Malignant neoplasm of cardia: Secondary | ICD-10-CM

## 2022-10-10 DIAGNOSIS — Z452 Encounter for adjustment and management of vascular access device: Secondary | ICD-10-CM | POA: Insufficient documentation

## 2022-10-10 MED ORDER — HEPARIN SOD (PORK) LOCK FLUSH 100 UNIT/ML IV SOLN
500.0000 [IU] | Freq: Once | INTRAVENOUS | Status: AC | PRN
  Administered 2022-10-10: 500 [IU] via INTRAVENOUS

## 2022-10-10 MED ORDER — SODIUM CHLORIDE 0.9% FLUSH
10.0000 mL | INTRAVENOUS | Status: DC | PRN
  Administered 2022-10-10: 10 mL via INTRAVENOUS

## 2022-12-08 ENCOUNTER — Other Ambulatory Visit: Payer: Self-pay

## 2022-12-08 DIAGNOSIS — C160 Malignant neoplasm of cardia: Secondary | ICD-10-CM

## 2022-12-12 ENCOUNTER — Inpatient Hospital Stay: Payer: Medicare Other | Attending: Hematology | Admitting: Hematology

## 2022-12-12 ENCOUNTER — Inpatient Hospital Stay: Payer: Medicare Other

## 2022-12-12 ENCOUNTER — Other Ambulatory Visit: Payer: Self-pay

## 2022-12-12 VITALS — BP 129/61 | HR 65 | Temp 98.1°F | Resp 18 | Wt 299.3 lb

## 2022-12-12 DIAGNOSIS — Z452 Encounter for adjustment and management of vascular access device: Secondary | ICD-10-CM | POA: Insufficient documentation

## 2022-12-12 DIAGNOSIS — Z8509 Personal history of malignant neoplasm of other digestive organs: Secondary | ICD-10-CM | POA: Insufficient documentation

## 2022-12-12 DIAGNOSIS — C160 Malignant neoplasm of cardia: Secondary | ICD-10-CM

## 2022-12-12 DIAGNOSIS — Z95828 Presence of other vascular implants and grafts: Secondary | ICD-10-CM

## 2022-12-12 DIAGNOSIS — Z7189 Other specified counseling: Secondary | ICD-10-CM

## 2022-12-12 DIAGNOSIS — C169 Malignant neoplasm of stomach, unspecified: Secondary | ICD-10-CM

## 2022-12-12 DIAGNOSIS — Z08 Encounter for follow-up examination after completed treatment for malignant neoplasm: Secondary | ICD-10-CM | POA: Insufficient documentation

## 2022-12-12 LAB — CBC WITH DIFFERENTIAL (CANCER CENTER ONLY)
Abs Immature Granulocytes: 0.01 10*3/uL (ref 0.00–0.07)
Basophils Absolute: 0 10*3/uL (ref 0.0–0.1)
Basophils Relative: 1 %
Eosinophils Absolute: 0.1 10*3/uL (ref 0.0–0.5)
Eosinophils Relative: 1 %
HCT: 36.6 % — ABNORMAL LOW (ref 39.0–52.0)
Hemoglobin: 12.3 g/dL — ABNORMAL LOW (ref 13.0–17.0)
Immature Granulocytes: 0 %
Lymphocytes Relative: 18 %
Lymphs Abs: 0.9 10*3/uL (ref 0.7–4.0)
MCH: 28.4 pg (ref 26.0–34.0)
MCHC: 33.6 g/dL (ref 30.0–36.0)
MCV: 84.5 fL (ref 80.0–100.0)
Monocytes Absolute: 0.4 10*3/uL (ref 0.1–1.0)
Monocytes Relative: 7 %
Neutro Abs: 3.7 10*3/uL (ref 1.7–7.7)
Neutrophils Relative %: 73 %
Platelet Count: 144 10*3/uL — ABNORMAL LOW (ref 150–400)
RBC: 4.33 MIL/uL (ref 4.22–5.81)
RDW: 13.3 % (ref 11.5–15.5)
WBC Count: 5.1 10*3/uL (ref 4.0–10.5)
nRBC: 0 % (ref 0.0–0.2)

## 2022-12-12 LAB — CMP (CANCER CENTER ONLY)
ALT: 18 U/L (ref 0–44)
AST: 16 U/L (ref 15–41)
Albumin: 3.8 g/dL (ref 3.5–5.0)
Alkaline Phosphatase: 56 U/L (ref 38–126)
Anion gap: 8 (ref 5–15)
BUN: 14 mg/dL (ref 8–23)
CO2: 25 mmol/L (ref 22–32)
Calcium: 9 mg/dL (ref 8.9–10.3)
Chloride: 104 mmol/L (ref 98–111)
Creatinine: 0.98 mg/dL (ref 0.61–1.24)
GFR, Estimated: >60 mL/min (ref >60)
Glucose, Bld: 157 mg/dL — ABNORMAL HIGH (ref 70–99)
Potassium: 4.3 mmol/L (ref 3.5–5.1)
Sodium: 137 mmol/L (ref 135–145)
Total Bilirubin: 0.5 mg/dL (ref 0.3–1.2)
Total Protein: 7.4 g/dL (ref 6.5–8.1)

## 2022-12-12 LAB — CEA (ACCESS): CEA (CHCC): 3.6 ng/mL (ref 0.00–5.00)

## 2022-12-12 MED ORDER — HEPARIN SOD (PORK) LOCK FLUSH 100 UNIT/ML IV SOLN
500.0000 [IU] | Freq: Once | INTRAVENOUS | Status: AC | PRN
  Administered 2022-12-12: 500 [IU] via INTRAVENOUS

## 2022-12-12 MED ORDER — SODIUM CHLORIDE 0.9% FLUSH
10.0000 mL | INTRAVENOUS | Status: DC | PRN
  Administered 2022-12-12: 10 mL via INTRAVENOUS

## 2022-12-12 NOTE — Progress Notes (Signed)
HEMATOLOGY/ONCOLOGY CLINIC NOTE  Date of Service: 12/12/22  Patient Care Team: Horald Pollen, MD as PCP - General (Internal Medicine) [name] (Surgery)    CHIEF COMPLAINTS/PURPOSE OF CONSULTATION:   Follow-up for continued evaluation and management of gastric adenocarcinoma  HISTORY OF PRESENTING ILLNESS:   [name] is a wonderful 72 y.o. male is here for continued evaluation and management of recently diagnosed pT4a, pN3b, pM1 Gastric Adenocarcinoma.   Patient was previously seen in the hospital on 01/19/2017 with a diagnosis of gastric adenocarcinoma in the gastric pylorus causing significant intrinsic stenosis . Patient had presented to primary care physician with dyspeptic symptoms, partial gastric outlet obstruction and weight loss of 60 pounds. Patient has had a distal gastrectomy, cholecystectomy with jejunal feeding tube placement by Dr. Barry Dienes on 01/10/2017 for gastric obstruction secondary to adenocarcinoma of the gastric pylorus. He was noted to have invaded into the pancreas and a bit of the anterior pancreatic head was resected en bloc with the tumor.   Pathology showed  " INVASIVE MODERATELY TO POORLY DIFFERENTIATED ADENOCARCINOMA, SPANNING 4 CM IN GREATEST DIMENSION. TUMOR INVADES THROUGH STOMACH WALL TO INVOLVE SEROSAL SURFACE.  LYMPH/VASCULAR INVASION IS IDENTIFIED.  MARGINS ARE NEGATIVE.- FIFTEEN OF THIRTY NINE LYMPH NODES POSITIVE FOR METASTATIC ADENOCARCINOMA (15/39). EXTRACAPSULAR EXTENSION IS IDENTIFIED. 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  Patient had a prolonged hospitalization post surgery  due to significant NG tube output. Needed epidural and PCA for pain control. It took a while for him to be able to tolerate progression of 2 feeding. He had an abdominal drain due to a small collection near his pancreatic head. Was treated with Augmentin for  this. Patient was in the hospital from 01/10/2017 and was eventually discharged on 01/27/2017.  Patient postponed several attempts at posthospitalization follow-up. He has been recovering at home and has been optimizing his tube feeding. Patient was recently followed up by Dr. Barry Dienes and has had a port placement on 02/23/2017 and preparation for possible chemotherapy.  He notes that his tube feeding is up to 40 mL per hour for 12 hours and has targeted is 16 ml per hour. He was recently admitted to [facility] in [place] for a C. difficile colitis that caused a severe diarrhea. he is currently on oral vancomycin has completed about 5 days of treatment and is also on probiotics.  CURRENT THERAPY: Active surveillance  PREVIOUS THERAPY:  Concurrent Chemoradiation with Xeloda. He started 07/18/17 and completed 08/25/17  INTERVAL HISTORY   [name] is here for continued evaluation and management of gastric adenocarcinoma.  Patient was last seen by me on 06/08/2022 and he was doing well overall.   Patient reports he has been doing well overall without any new medical concerns. He reports he had couple episodes of dizziness and nausea in November due to dehydration.   He reports his blood pressure and diabetes is controlled with medications.   He denies fever, chills, night sweats, unexpected weight loss, abnormal bowel movement, diarrhea, nausea, fatigue, back pain, abdominal pain, or leg swelling.    MEDICAL HISTORY:  Past Medical History:  Diagnosis Date   Anemia    Cancer ([place]) dx'd 11/2016   Gastric Adenocarcinoma   Gastric outlet obstruction 01/2017   GERD (gastroesophageal reflux disease)    Pre-diabetes    denies   SVT (supraventricular tachycardia)    during Admission and Surgery- 01/2017    SURGICAL HISTORY: Past Surgical History:  Procedure Laterality Date   CHOLECYSTECTOMY N/A 01/10/2017   Procedure: CHOLECYSTECTOMY;  Surgeon: Stark Klein, MD;  Location: [facility];  Service:  General;  Laterality: N/A;   COLONOSCOPY     COLONOSCOPY  10/11/2011   Procedure: COLONOSCOPY;  Surgeon: Daneil Dolin, MD;  [facility];  Service: Endoscopy;  Laterality: N/A;  10:30 AM   ESOPHAGOGASTRODUODENOSCOPY N/A 12/30/2016   Procedure: ESOPHAGOGASTRODUODENOSCOPY (EGD);  Surgeon: Carol Ada, MD;  Location: [facility];  Service: Endoscopy;  Laterality: N/A;   GASTRECTOMY N/A 01/10/2017   Procedure: DISTAL GASTRECTOMY, JEJUNUM  FEEDING TUBE PLACEMENT;  Surgeon: Stark Klein, MD;  Location: [facility];  Service: General;  Laterality: N/A;   IR GENERIC HISTORICAL  01/24/2017   IR [facility] DUODEN/JEJUNO TUBE PERCUT W/FLUORO 01/24/2017 Aletta Edouard, MD [facility]   IR [facility] DUODEN/JEJUNO TUBE PERCUT W/FLUORO  03/26/2017   PORTACATH PLACEMENT N/A 02/23/2017   Procedure: INSERTION PORT-A-CATH;  Surgeon: Stark Klein, MD;  Location: [facility];  Service: General;  Laterality: N/A;   testicle removed  1982    SOCIAL HISTORY: Social History   Socioeconomic History   Marital status: Married    Spouse name: Not on file   Number of children: Not on file   Years of education: Not on file   Highest education level: Not on file  Occupational History   Not on file  Tobacco Use   Smoking status: Never   Smokeless tobacco: Never  Vaping Use   Vaping Use: Unknown  Substance and Sexual Activity   Alcohol use: No    Comment: occ beer or wine but none in last 6 months    Drug use: No   Sexual activity: Not on file  Other Topics Concern   Not on file  Social History Narrative   Not on file   Social Determinants of Health   Financial Resource Strain: Not on file  Food Insecurity: Not on file  Transportation Needs: Not on file  Physical Activity: Not on file  Stress: Not on file  Social Connections: Not on file  Intimate Partner Violence: Not on file    FAMILY HISTORY: Family History  Problem Relation Age of Onset   Prostate cancer Father    Colon cancer Neg Hx      ALLERGIES:  is allergic to no known allergies.  MEDICATIONS:  Current Outpatient Medications  Medication Sig Dispense Refill   Dulaglutide (TRULICITY) 6.26 RS/8.5IO SOPN Inject 0.75 mg into the skin once a week. 2 mL 6   ferrous sulfate 325 (65 FE) MG tablet Take 325 mg by mouth daily with breakfast.     ketoconazole (NIZORAL) 2 % cream Apply 1 application topically daily. 30 g 5   Multiple Vitamin (MULTIVITAMIN) tablet Take 1 tablet by mouth daily.     olmesartan (BENICAR) 20 MG tablet Take 1 tablet (20 mg total) by mouth daily. 90 tablet 3   rosuvastatin (CRESTOR) 10 MG tablet Take 1 tablet (10 mg total) by mouth daily. 90 tablet 3   No current facility-administered medications for this visit.    10 Point review of Systems was done is negative except as noted above.  PHYSICAL EXAMINATION:  .BP 129/61   Pulse 65   Temp 98.1 F (36.7 C)   Resp 18   Wt 299 lb 4.8 oz (135.8 kg)   SpO2 100%   BMI 41.74 kg/m  NAD GENERAL:alert, in no acute distress and comfortable SKIN: no acute rashes, no significant lesions EYES: conjunctiva are pink  and non-injected, sclera anicteric OROPHARYNX: MMM, no exudates, no oropharyngeal erythema or ulceration NECK: supple, no JVD LYMPH:  no palpable lymphadenopathy in the cervical, axillary or inguinal regions LUNGS: clear to auscultation b/l with normal respiratory effort HEART: regular rate & rhythm ABDOMEN:  normoactive bowel sounds , non tender, not distended. Extremity: no pedal edema PSYCH: alert & oriented x 3 with fluent speech NEURO: no focal motor/sensory deficits  LABORATORY DATA:  I have reviewed the data as listed     Latest Ref Rng & Units 12/12/2022    1:41 PM 06/08/2022    2:32 PM 12/21/2021    2:31 PM  CBC  WBC 4.0 - 10.5 K/uL 5.1  5.8  5.3   Hemoglobin 13.0 - 17.0 g/dL 12.3  11.9  11.6   Hematocrit 39.0 - 52.0 % 36.6  35.4  35.5   Platelets 150 - 400 K/uL 144  139  158        Latest Ref Rng & Units 12/12/2022     1:41 PM 06/08/2022    2:32 PM 06/06/2022    3:05 PM  CMP  Glucose 70 - 99 mg/dL 157  175  135   BUN 8 - 23 mg/dL '14  15  14   '$ Creatinine 0.61 - 1.24 mg/dL 0.98  1.27  1.04   Sodium 135 - 145 mmol/L 137  141  139   Potassium 3.5 - 5.1 mmol/L 4.3  4.9  4.4   Chloride 98 - 111 mmol/L 104  110  105   CO2 22 - 32 mmol/L '25  26  27   '$ Calcium 8.9 - 10.3 mg/dL 9.0  9.4  9.0   Total Protein 6.5 - 8.1 g/dL 7.4  7.5  7.1   Total Bilirubin 0.3 - 1.2 mg/dL 0.5  0.4  0.6   Alkaline Phos 38 - 126 U/L 56  61  60   AST 15 - 41 U/L '16  21  19   '$ ALT 0 - 44 U/L '18  23  21       '$ RADIOGRAPHIC STUDIES: I have personally reviewed the radiological images as listed and agreed with the findings in the report. No results found.   ASSESSMENT & PLAN:   72 y.o.  African-American male with   1) Resected Stage IV pT4a, pN3b, pM1 invasive moderately-poorly Gastric Adenocarcinoma . Her 2 neg by FISH LVI + margins neg 15/39 LN +ve, Extracapsular invasion +, some involvement of pancreatic capsule there pM1 Presented with severe pyloric stenosis with ulceration. S/p 60 pound weight loss- now resolved. S/p distal gastrectomy, cholecystectomy and en bloc dissection off the small part of the pancreatic head  with jejunal feeding tube placement. Initial CT abdomen and pelvis did not show any local regional lymphadenopathy. Rpt CT chest/abd/pelvis from 03/22/2017 - no overt evidence of residual disease or metastatic disease. Post-surgical peripancreatic fluid collection resolving. CT chest/abd/pelvis 06/21/2017 after 6 cycles of FOLFOX -  No evidence of residual or metastatic carcinoma within the chest, abdomen, or pelvis -Started Chemoradiation on 07/18/17 with Xeloda ('825mg'$ /m2 twice daily Monday through Friday while on radiation) and completed treatment on 08/24/2017. CT chest/abd/pelvis (11/16/2017) - show radiation related changes but no evidence of tumor progression at this time.  07/13/18 CT C/A/P revealed No evidence of  metastatic disease in the chest, abdomen or pelvis. Previously described mildly enlarged high left para-aortic retroperitoneal node is decreased and now normal in size. Previously described peripancreatic fat stranding is decreased and probably treatment related.  03/11/19 CT A/P revealed "Status post distal gastrectomy, without findings to suggest local recurrence of disease or definite metastatic disease in the abdomen or pelvis. 2. Colonic diverticulosis without evidence of acute diverticulitis at this time. 3. Incidental findings, as above, similar to prior studies."  09/30/2019 CT C/A/P [phone]) revealed "1. Status post distal gastrectomy with gastrojejunostomy. No specific findings identified to suggest local tumor recurrence or metastatic disease. 2. Aortic atherosclerosis. Coronary artery calcifications noted. Aortic Atherosclerosis (ICD10-I70.0)."  #2 status post port placement on 02/23/2017 by Dr. Barry Dienes   #3 h/o Clostridium difficile colitis -. No overt issues with diarrhea currently.   #4 suspected sleep apnea. Has not had a sleep study. Wife notes significant snoring which is important with weight loss.  Will have to be careful with excessive sedation. -continue prn low dose trazodone for insomnia -recommended he f/u with his PCP for consideration of sleep study-pending   PLAN: -Discussed lab results from today, 12/12/2022, with the patient. CBC shows hemoglobin of 12.3, hematocrit of 36.6, and platelets of 144. CMP shows elevated glucose at 157. CEA level 3.6 -Recommended staying hydrated.  -Patient has no lab, clinical or radiographic evidence of recurrence of his gastric adenocarcinoma. -We shall see him back in 6 months with repeat labs unless he has any new symptoms in the interim.   FOLLOW-UP: RTC with Dr Irene Limbo with labs in 6 months  The total time spent in the appointment was 20 minutes* .  All of the patient's questions were answered with apparent  satisfaction. The patient knows to call the clinic with any problems, questions or concerns.   Sullivan Lone MD MS AAHIVMS [facility] Hematology/Oncology Physician [facility]  .*Total Encounter Time as defined by the Centers for Medicare and Medicaid Services includes, in addition to the face-to-face time of a patient visit (documented in the note above) non-face-to-face time: obtaining and reviewing outside history, ordering and reviewing medications, tests or procedures, care coordination (communications with other health care professionals or caregivers) and documentation in the medical record.   I, Cleda Mccreedy, am acting as a Education administrator for Sullivan Lone, MD. .I have reviewed the above documentation for accuracy and completeness, and I agree with the above. Brunetta Genera MD

## 2022-12-18 ENCOUNTER — Encounter: Payer: Self-pay | Admitting: Hematology

## 2023-03-16 ENCOUNTER — Ambulatory Visit: Payer: Medicare Other | Admitting: Emergency Medicine

## 2023-03-20 ENCOUNTER — Encounter: Payer: Self-pay | Admitting: Emergency Medicine

## 2023-03-20 ENCOUNTER — Ambulatory Visit (INDEPENDENT_AMBULATORY_CARE_PROVIDER_SITE_OTHER): Payer: Medicare Other | Admitting: Emergency Medicine

## 2023-03-20 VITALS — BP 126/68 | HR 75 | Temp 98.1°F | Ht 71.0 in | Wt 301.0 lb

## 2023-03-20 DIAGNOSIS — E1159 Type 2 diabetes mellitus with other circulatory complications: Secondary | ICD-10-CM | POA: Diagnosis not present

## 2023-03-20 DIAGNOSIS — E1169 Type 2 diabetes mellitus with other specified complication: Secondary | ICD-10-CM

## 2023-03-20 DIAGNOSIS — Z7985 Long-term (current) use of injectable non-insulin antidiabetic drugs: Secondary | ICD-10-CM

## 2023-03-20 DIAGNOSIS — E1165 Type 2 diabetes mellitus with hyperglycemia: Secondary | ICD-10-CM | POA: Diagnosis not present

## 2023-03-20 DIAGNOSIS — I152 Hypertension secondary to endocrine disorders: Secondary | ICD-10-CM | POA: Diagnosis not present

## 2023-03-20 DIAGNOSIS — Z1211 Encounter for screening for malignant neoplasm of colon: Secondary | ICD-10-CM | POA: Diagnosis not present

## 2023-03-20 DIAGNOSIS — E785 Hyperlipidemia, unspecified: Secondary | ICD-10-CM | POA: Diagnosis not present

## 2023-03-20 LAB — POCT GLYCOSYLATED HEMOGLOBIN (HGB A1C): Hemoglobin A1C: 6.6 % — AB (ref 4.0–5.6)

## 2023-03-20 MED ORDER — TRULICITY 0.75 MG/0.5ML ~~LOC~~ SOAJ: 0.7500 mg | SUBCUTANEOUS | 6 refills | Status: DC

## 2023-03-20 NOTE — Progress Notes (Signed)
[name] 72 y.o.   No chief complaint on file.   HISTORY OF PRESENT ILLNESS: This is a 72 y.o. male here for follow-up of chronic medical problems including diabetes and hypertension Overall doing well. Has no cough plaints or medical concerns today. Wt Readings from Last 3 Encounters:  03/20/23 (!) 301 lb (136.5 kg)  12/12/22 299 lb 4.8 oz (135.8 kg)  09/06/22 292 lb 4 oz (132.6 kg)     HPI   Prior to Admission medications   Medication Sig Start Date End Date Taking? Authorizing Provider  ferrous sulfate 325 (65 FE) MG tablet Take 325 mg by mouth daily with breakfast.   Yes [provider]  ketoconazole (NIZORAL) 2 % cream Apply 1 application topically daily. 11/01/21  Yes Mihailo Sage, Eilleen Kempf, MD  Multiple Vitamin (MULTIVITAMIN) tablet Take 1 tablet by mouth daily.   Yes [provider]  olmesartan (BENICAR) 20 MG tablet Take 1 tablet (20 mg total) by mouth daily. 06/06/22  Yes Billiejo Sorto, Eilleen Kempf, MD  rosuvastatin (CRESTOR) 10 MG tablet Take 1 tablet (10 mg total) by mouth daily. 06/06/22  Yes Lenaya Pietsch, Eilleen Kempf, MD  Dulaglutide (TRULICITY) 0.75 MG/0.5ML SOPN Inject 0.75 mg into the skin once a week. 03/20/23   Georgina Quint, MD    Allergies  Allergen Reactions   No Known Allergies     Patient Active Problem List   Diagnosis Date Noted   Diabetic polyneuropathy associated with type 2 diabetes mellitus (HCC) 06/15/2021   Hyperlipidemia with target LDL less than 100 06/15/2021   Dyslipidemia associated with type 2 diabetes mellitus (HCC) 06/15/2021   Numbness and tingling of both feet 06/15/2021   Hypertension associated with diabetes (HCC) 06/15/2021   Abnormal electrocardiogram (ECG) (EKG) 06/15/2021   Counseling regarding advance care planning and goals of care 08/13/2018   Port catheter in place 08/03/2017   GERD (gastroesophageal reflux disease) 03/25/2017   Anemia 03/25/2017   NSVT (nonsustained ventricular tachycardia) (HCC)     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    Past Medical History:  Diagnosis Date   Anemia    Cancer (HCC) dx'd 11/2016   Gastric Adenocarcinoma   Gastric outlet obstruction 01/2017   GERD (gastroesophageal reflux disease)    Pre-diabetes    denies   SVT (supraventricular tachycardia)    during Admission and Surgery- 01/2017    Past Surgical History:  Procedure Laterality Date   CHOLECYSTECTOMY N/A 01/10/2017   Procedure: CHOLECYSTECTOMY;  Surgeon: Almond Lint, MD;  Location: [facility] OR;  Service: General;  Laterality: N/A;   COLONOSCOPY     COLONOSCOPY  10/11/2011   Procedure: COLONOSCOPY;  Surgeon: Corbin Ade, MD;  Location: [facility];  Service: Endoscopy;  Laterality: N/A;  10:30 AM   ESOPHAGOGASTRODUODENOSCOPY N/A 12/30/2016   Procedure: ESOPHAGOGASTRODUODENOSCOPY (EGD);  Surgeon: Jeani Hawking, MD;  Location: [facility];  Service: Endoscopy;  Laterality: N/A;   GASTRECTOMY N/A 01/10/2017   Procedure: DISTAL GASTRECTOMY, JEJUNUM  FEEDING TUBE PLACEMENT;  Surgeon: Almond Lint, MD;  Location: [facility] OR;  Service: General;  Laterality: N/A;   IR GENERIC HISTORICAL  01/24/2017   IR REPLC DUODEN/JEJUNO TUBE PERCUT W/FLUORO 01/24/2017 Irish Lack, MD [facility]   IR [facility] DUODEN/JEJUNO TUBE PERCUT W/FLUORO  03/26/2017   PORTACATH PLACEMENT N/A 02/23/2017   Procedure: INSERTION PORT-A-CATH;  Surgeon: Almond Lint, MD;  Location: [facility] OR;  Service: General;  Laterality: N/A;   testicle removed  1982    Social History   Socioeconomic  History   Marital status: Married    Spouse name: Not on file   Number of children: Not on file   Years of education: Not on file   Highest education level: Not on file  Occupational History   Not on file  Tobacco Use   Smoking status: Never   Smokeless tobacco: Never  Vaping Use   Vaping Use: Unknown  Substance and Sexual Activity   Alcohol use: No    Comment: occ beer or wine but none in last 6 months    Drug use: No    Sexual activity: Not on file  Other Topics Concern   Not on file  Social History Narrative   Not on file   Social Determinants of Health   Financial Resource Strain: Not on file  Food Insecurity: Not on file  Transportation Needs: Not on file  Physical Activity: Not on file  Stress: Not on file  Social Connections: Not on file  Intimate Partner Violence: Not on file    Family History  Problem Relation Age of Onset   Prostate cancer Father    Colon cancer Neg Hx      Review of Systems  Constitutional: Negative.  Negative for chills and fever.  HENT: Negative.  Negative for congestion and sore throat.   Respiratory: Negative.  Negative for cough and shortness of breath.   Cardiovascular: Negative.  Negative for chest pain and palpitations.  Gastrointestinal:  Negative for abdominal pain, diarrhea, nausea and vomiting.  Genitourinary: Negative.  Negative for dysuria and hematuria.  Skin: Negative.  Negative for rash.  Neurological: Negative.  Negative for dizziness and headaches.  All other systems reviewed and are negative.   Vitals:   03/20/23 1354  BP: 126/68  Pulse: 75  Temp: 98.1 F (36.7 C)  SpO2: 98%    Physical Exam Vitals reviewed.  Constitutional:      Appearance: Normal appearance. He is obese.  HENT:     Head: Normocephalic.     Mouth/Throat:     Mouth: Mucous membranes are moist.     Pharynx: Oropharynx is clear.  Eyes:     Extraocular Movements: Extraocular movements intact.     Conjunctiva/sclera: Conjunctivae normal.     Pupils: Pupils are equal, round, and reactive to light.  Cardiovascular:     Rate and Rhythm: Normal rate and regular rhythm.     Pulses: Normal pulses.     Heart sounds: Normal heart sounds.  Pulmonary:     Effort: Pulmonary effort is normal.     Breath sounds: Normal breath sounds.  Musculoskeletal:     Cervical back: No tenderness.  Lymphadenopathy:     Cervical: No cervical adenopathy.  Skin:    General: Skin is  warm and dry.     Capillary Refill: Capillary refill takes less than 2 seconds.  Neurological:     General: No focal deficit present.     Mental Status: He is alert and oriented to person, place, and time.  Psychiatric:        Mood and Affect: Mood normal.        Behavior: Behavior normal.     Results for orders placed or performed in visit on 03/20/23 (from the past 24 hour(s))  POCT HgB A1C     Status: Abnormal   Collection Time: 03/20/23  2:03 PM  Result Value Ref Range   Hemoglobin A1C 6.6 (A) 4.0 - 5.6 %   HbA1c POC (<> result, manual entry)  HbA1c, POC (prediabetic range)     HbA1c, POC (controlled diabetic range)      ASSESSMENT & PLAN: A total of 46 minutes was spent with the patient and counseling/coordination of care regarding preparing for this visit, review of most recent office visit notes, review of most recent blood work results including interpretation of today's hemoglobin A1c, cardiovascular risks associated with hypertension, diabetes and dyslipidemia, education on nutrition, review of health maintenance items, review of all medications, prognosis, documentation, and need for follow-up.  Problem List Items Addressed This Visit       Cardiovascular and Mediastinum   Hypertension associated with diabetes (HCC) - Primary    Well-controlled hypertension Continue olmesartan 20 mg daily Well-controlled diabetes with hemoglobin A1c of 6.6 Continue weekly Trulicity 0.75 mg Cardiovascular risk associated with hypertension and diabetes discussed Diet and nutrition discussed Benefits of exercise discussed      Relevant Medications   Dulaglutide (TRULICITY) 0.75 MG/0.5ML SOPN     Endocrine   Dyslipidemia associated with type 2 diabetes mellitus (HCC)    Chronic stable condition Continue rosuvastatin 10 mg daily Diet and nutrition discussed       Relevant Medications   Dulaglutide (TRULICITY) 0.75 MG/0.5ML SOPN   Other Visit Diagnoses     Type 2 diabetes  mellitus with hyperglycemia, without long-term current use of insulin (HCC)       Relevant Medications   Dulaglutide (TRULICITY) 0.75 MG/0.5ML SOPN   Other Relevant Orders   POCT HgB A1C (Completed)   Screening for colon cancer       Relevant Orders   Cologuard      Patient Instructions  Diabetes Mellitus and Nutrition, Adult When you have diabetes, or diabetes mellitus, it is very important to have healthy eating habits because your blood sugar (glucose) levels are greatly affected by what you eat and drink. Eating healthy foods in the right amounts, at about the same times every day, can help you: Manage your blood glucose. Lower your risk of heart disease. Improve your blood pressure. Reach or maintain a healthy weight. What can affect my meal plan? Every person with diabetes is different, and each person has different needs for a meal plan. Your health care provider may recommend that you work with a dietitian to make a meal plan that is best for you. Your meal plan may vary depending on factors such as: The calories you need. The medicines you take. Your weight. Your blood glucose, blood pressure, and cholesterol levels. Your activity level. Other health conditions you have, such as heart or kidney disease. How do carbohydrates affect me? Carbohydrates, also called carbs, affect your blood glucose level more than any other type of food. Eating carbs raises the amount of glucose in your blood. It is important to know how many carbs you can safely have in each meal. This is different for every person. Your dietitian can help you calculate how many carbs you should have at each meal and for each snack. How does alcohol affect me? Alcohol can cause a decrease in blood glucose (hypoglycemia), especially if you use insulin or take certain diabetes medicines by mouth. Hypoglycemia can be a life-threatening condition. Symptoms of hypoglycemia, such as sleepiness, dizziness, and confusion,  are similar to symptoms of having too much alcohol. Do not drink alcohol if: Your health care provider tells you not to drink. You are pregnant, may be pregnant, or are planning to become pregnant. If you drink alcohol: Limit how much you have to: 0-1  drink a day for women. 0-2 drinks a day for men. Know how much alcohol is in your drink. In the [place] one drink equals one 12 oz bottle of beer (355 mL), one 5 oz glass of wine (148 mL), or one 1 oz glass of hard liquor (44 mL). Keep yourself hydrated with water, diet soda, or unsweetened iced tea. Keep in mind that regular soda, juice, and other mixers may contain a lot of sugar and must be counted as carbs. What are tips for following this plan?  Reading food labels Start by checking the serving size on the Nutrition Facts label of packaged foods and drinks. The number of calories and the amount of carbs, fats, and other nutrients listed on the label are based on one serving of the item. Many items contain more than one serving per package. Check the total grams (g) of carbs in one serving. Check the number of grams of saturated fats and trans fats in one serving. Choose foods that have a low amount or none of these fats. Check the number of milligrams (mg) of salt (sodium) in one serving. Most people should limit total sodium intake to less than 2,300 mg per day. Always check the nutrition information of foods labeled as "low-fat" or "nonfat." These foods may be higher in added sugar or refined carbs and should be avoided. Talk to your dietitian to identify your daily goals for nutrients listed on the label. Shopping Avoid buying canned, pre-made, or processed foods. These foods tend to be high in fat, sodium, and added sugar. Shop around the outside edge of the grocery store. This is where you will most often find fresh fruits and vegetables, bulk grains, fresh meats, and fresh dairy products. Cooking Use low-heat cooking methods, such as  baking, instead of high-heat cooking methods, such as deep frying. Cook using healthy oils, such as olive, canola, or sunflower oil. Avoid cooking with butter, cream, or high-fat meats. Meal planning Eat meals and snacks regularly, preferably at the same times every day. Avoid going long periods of time without eating. Eat foods that are high in fiber, such as fresh fruits, vegetables, beans, and whole grains. Eat 4-6 oz (112-168 g) of lean protein each day, such as lean meat, chicken, fish, eggs, or tofu. One ounce (oz) (28 g) of lean protein is equal to: 1 oz (28 g) of meat, chicken, or fish. 1 egg.  cup (62 g) of tofu. Eat some foods each day that contain healthy fats, such as avocado, nuts, seeds, and fish. What foods should I eat? Fruits Berries. Apples. Oranges. Peaches. Apricots. Plums. Grapes. Mangoes. Papayas. Pomegranates. Kiwi. Cherries. Vegetables Leafy greens, including lettuce, spinach, kale, chard, collard greens, mustard greens, and cabbage. Beets. Cauliflower. Broccoli. Carrots. Green beans. Tomatoes. Peppers. Onions. Cucumbers. Brussels sprouts. Grains Whole grains, such as whole-wheat or whole-grain bread, crackers, tortillas, cereal, and pasta. Unsweetened oatmeal. Quinoa. Brown or wild rice. Meats and other proteins Seafood. Poultry without skin. Lean cuts of poultry and beef. Tofu. Nuts. Seeds. Dairy Low-fat or fat-free dairy products such as milk, yogurt, and cheese. The items listed above may not be a complete list of foods and beverages you can eat and drink. Contact a dietitian for more information. What foods should I avoid? Fruits Fruits canned with syrup. Vegetables Canned vegetables. Frozen vegetables with butter or cream sauce. Grains Refined white flour and flour products such as bread, pasta, snack foods, and cereals. Avoid all processed foods. Meats and other proteins Fatty cuts  of meat. Poultry with skin. Breaded or fried meats. Processed meat. Avoid  saturated fats. Dairy Full-fat yogurt, cheese, or milk. Beverages Sweetened drinks, such as soda or iced tea. The items listed above may not be a complete list of foods and beverages you should avoid. Contact a dietitian for more information. Questions to ask a health care provider Do I need to meet with a certified diabetes care and education specialist? Do I need to meet with a dietitian? What number can I call if I have questions? When are the best times to check my blood glucose? Where to find more information: American Diabetes Association: diabetes.org Academy of Nutrition and Dietetics: eatright.[employer] of Diabetes and Digestive and Kidney Diseases: StageSync.si Association of Diabetes Care & Education Specialists: diabeteseducator.org Summary It is important to have healthy eating habits because your blood sugar (glucose) levels are greatly affected by what you eat and drink. It is important to use alcohol carefully. A healthy meal plan will help you manage your blood glucose and lower your risk of heart disease. Your health care provider may recommend that you work with a dietitian to make a meal plan that is best for you. This information is not intended to replace advice given to you by your health care provider. Make sure you discuss any questions you have with your health care provider. Document Revised: 06/03/2020 Document Reviewed: 06/03/2020 Elsevier Patient Education  2023 Elsevier Inc.    Patient Instructions  Diabetes Mellitus and Nutrition, Adult When you have diabetes, or diabetes mellitus, it is very important to have healthy eating habits because your blood sugar (glucose) levels are greatly affected by what you eat and drink. Eating healthy foods in the right amounts, at about the same times every day, can help you: Manage your blood glucose. Lower your risk of heart disease. Improve your blood pressure. Reach or maintain a healthy  weight. What can affect my meal plan? Every person with diabetes is different, and each person has different needs for a meal plan. Your health care provider may recommend that you work with a dietitian to make a meal plan that is best for you. Your meal plan may vary depending on factors such as: The calories you need. The medicines you take. Your weight. Your blood glucose, blood pressure, and cholesterol levels. Your activity level. Other health conditions you have, such as heart or kidney disease. How do carbohydrates affect me? Carbohydrates, also called carbs, affect your blood glucose level more than any other type of food. Eating carbs raises the amount of glucose in your blood. It is important to know how many carbs you can safely have in each meal. This is different for every person. Your dietitian can help you calculate how many carbs you should have at each meal and for each snack. How does alcohol affect me? Alcohol can cause a decrease in blood glucose (hypoglycemia), especially if you use insulin or take certain diabetes medicines by mouth. Hypoglycemia can be a life-threatening condition. Symptoms of hypoglycemia, such as sleepiness, dizziness, and confusion, are similar to symptoms of having too much alcohol. Do not drink alcohol if: Your health care provider tells you not to drink. You are pregnant, may be pregnant, or are planning to become pregnant. If you drink alcohol: Limit how much you have to: 0-1 drink a day for women. 0-2 drinks a day for men. Know how much alcohol is in your drink. In the [place] one drink equals one 12 oz bottle of  beer (355 mL), one 5 oz glass of wine (148 mL), or one 1 oz glass of hard liquor (44 mL). Keep yourself hydrated with water, diet soda, or unsweetened iced tea. Keep in mind that regular soda, juice, and other mixers may contain a lot of sugar and must be counted as carbs. What are tips for following this plan?  Reading food  labels Start by checking the serving size on the Nutrition Facts label of packaged foods and drinks. The number of calories and the amount of carbs, fats, and other nutrients listed on the label are based on one serving of the item. Many items contain more than one serving per package. Check the total grams (g) of carbs in one serving. Check the number of grams of saturated fats and trans fats in one serving. Choose foods that have a low amount or none of these fats. Check the number of milligrams (mg) of salt (sodium) in one serving. Most people should limit total sodium intake to less than 2,300 mg per day. Always check the nutrition information of foods labeled as "low-fat" or "nonfat." These foods may be higher in added sugar or refined carbs and should be avoided. Talk to your dietitian to identify your daily goals for nutrients listed on the label. Shopping Avoid buying canned, pre-made, or processed foods. These foods tend to be high in fat, sodium, and added sugar. Shop around the outside edge of the grocery store. This is where you will most often find fresh fruits and vegetables, bulk grains, fresh meats, and fresh dairy products. Cooking Use low-heat cooking methods, such as baking, instead of high-heat cooking methods, such as deep frying. Cook using healthy oils, such as olive, canola, or sunflower oil. Avoid cooking with butter, cream, or high-fat meats. Meal planning Eat meals and snacks regularly, preferably at the same times every day. Avoid going long periods of time without eating. Eat foods that are high in fiber, such as fresh fruits, vegetables, beans, and whole grains. Eat 4-6 oz (112-168 g) of lean protein each day, such as lean meat, chicken, fish, eggs, or tofu. One ounce (oz) (28 g) of lean protein is equal to: 1 oz (28 g) of meat, chicken, or fish. 1 egg.  cup (62 g) of tofu. Eat some foods each day that contain healthy fats, such as avocado, nuts, seeds, and  fish. What foods should I eat? Fruits Berries. Apples. Oranges. Peaches. Apricots. Plums. Grapes. Mangoes. Papayas. Pomegranates. Kiwi. Cherries. Vegetables Leafy greens, including lettuce, spinach, kale, chard, collard greens, mustard greens, and cabbage. Beets. Cauliflower. Broccoli. Carrots. Green beans. Tomatoes. Peppers. Onions. Cucumbers. Brussels sprouts. Grains Whole grains, such as whole-wheat or whole-grain bread, crackers, tortillas, cereal, and pasta. Unsweetened oatmeal. Quinoa. Brown or wild rice. Meats and other proteins Seafood. Poultry without skin. Lean cuts of poultry and beef. Tofu. Nuts. Seeds. Dairy Low-fat or fat-free dairy products such as milk, yogurt, and cheese. The items listed above may not be a complete list of foods and beverages you can eat and drink. Contact a dietitian for more information. What foods should I avoid? Fruits Fruits canned with syrup. Vegetables Canned vegetables. Frozen vegetables with butter or cream sauce. Grains Refined white flour and flour products such as bread, pasta, snack foods, and cereals. Avoid all processed foods. Meats and other proteins Fatty cuts of meat. Poultry with skin. Breaded or fried meats. Processed meat. Avoid saturated fats. Dairy Full-fat yogurt, cheese, or milk. Beverages Sweetened drinks, such as soda or iced tea.  The items listed above may not be a complete list of foods and beverages you should avoid. Contact a dietitian for more information. Questions to ask a health care provider Do I need to meet with a certified diabetes care and education specialist? Do I need to meet with a dietitian? What number can I call if I have questions? When are the best times to check my blood glucose? Where to find more information: American Diabetes Association: diabetes.org Academy of Nutrition and Dietetics: eatright.[employer] of Diabetes and Digestive and Kidney Diseases: StageSync.si Association of  Diabetes Care & Education Specialists: diabeteseducator.org Summary It is important to have healthy eating habits because your blood sugar (glucose) levels are greatly affected by what you eat and drink. It is important to use alcohol carefully. A healthy meal plan will help you manage your blood glucose and lower your risk of heart disease. Your health care provider may recommend that you work with a dietitian to make a meal plan that is best for you. This information is not intended to replace advice given to you by your health care provider. Make sure you discuss any questions you have with your health care provider. Document Revised: 06/03/2020 Document Reviewed: 06/03/2020 Elsevier Patient Education  2023 Elsevier Inc.      Edwina Barth, MD [facility]

## 2023-03-20 NOTE — Patient Instructions (Signed)

## 2023-03-20 NOTE — Assessment & Plan Note (Signed)
Well-controlled hypertension Continue olmesartan 20 mg daily Well-controlled diabetes with hemoglobin A1c of 6.6 Continue weekly Trulicity 0.75 mg Cardiovascular risk associated with hypertension and diabetes discussed Diet and nutrition discussed Benefits of exercise discussed

## 2023-03-20 NOTE — Assessment & Plan Note (Signed)
Chronic stable condition.  Continue rosuvastatin 10 mg daily. Diet and nutrition discussed. 

## 2023-06-05 ENCOUNTER — Ambulatory Visit: Payer: Medicare Other

## 2023-06-05 VITALS — Ht 71.0 in | Wt 280.0 lb

## 2023-06-05 DIAGNOSIS — Z0000 Encounter for general adult medical examination without abnormal findings: Secondary | ICD-10-CM

## 2023-06-05 NOTE — Patient Instructions (Addendum)
Mr. [name] , Thank you for taking time to come for your Medicare Wellness Visit. I appreciate your ongoing commitment to your health goals. Please review the following plan we discussed and let me know if I can assist you in the future.   These are the goals we discussed:  Goals      My healthcare goal for 2024 is to maintain my health by staying healthy.        This is a list of the screening recommended for you and due dates:  Health Maintenance  Topic Date Due   Eye exam for diabetics  Never done   Hepatitis C Screening  Never done   DTaP/Tdap/Td vaccine (1 - Tdap) Never done   Zoster (Shingles) Vaccine (1 of 2) Never done   Colon Cancer Screening  10/10/2021   Yearly kidney health urinalysis for diabetes  06/15/2022   Complete foot exam   06/15/2022   COVID-19 Vaccine (6 - 2023-24 season) 11/01/2022   Flu Shot  06/15/2023   Hemoglobin A1C  09/20/2023   Yearly kidney function blood test for diabetes  12/13/2023   Medicare Annual Wellness Visit  06/04/2024   Pneumonia Vaccine  Completed   HPV Vaccine  Aged Out    Advanced directives: No; Patient is currently working on completing Advanced Directives/Living Will.  Conditions/risks identified: Yes  Next appointment: It was nice speaking with you today!  Please follow up in one year for your annual wellness visit via telephone call with Nurse Percell Miller on 06/05/2024 at 1:00 p.m.  If you need to cancel or reschedule please call [phone].  Preventive Care 24 Years and Older, Male  Preventive care refers to lifestyle choices and visits with your health care provider that can promote health and wellness. What does preventive care include? A yearly physical exam. This is also called an annual well check. Dental exams once or twice a year. Routine eye exams. Ask your health care provider how often you should have your eyes checked. Personal lifestyle choices, including: Daily care of your teeth and gums. Regular physical  activity. Eating a healthy diet. Avoiding tobacco and drug use. Limiting alcohol use. Practicing safe sex. Taking low doses of aspirin every day. Taking vitamin and mineral supplements as recommended by your health care provider. What happens during an annual well check? The services and screenings done by your health care provider during your annual well check will depend on your age, overall health, lifestyle risk factors, and family history of disease. Counseling  Your health care provider may ask you questions about your: Alcohol use. Tobacco use. Drug use. Emotional well-being. Home and relationship well-being. Sexual activity. Eating habits. History of falls. Memory and ability to understand (cognition). Work and work Astronomer. Screening  You may have the following tests or measurements: Height, weight, and BMI. Blood pressure. Lipid and cholesterol levels. These may be checked every 5 years, or more frequently if you are over 17 years old. Skin check. Lung cancer screening. You may have this screening every year starting at age 58 if you have a 30-pack-year history of smoking and currently smoke or have quit within the past 15 years. Fecal occult blood test (FOBT) of the stool. You may have this test every year starting at age 35. Flexible sigmoidoscopy or colonoscopy. You may have a sigmoidoscopy every 5 years or a colonoscopy every 10 years starting at age 85. Prostate cancer screening. Recommendations will vary depending on your family history and other risks. Hepatitis C  blood test. Hepatitis B blood test. Sexually transmitted disease (STD) testing. Diabetes screening. This is done by checking your blood sugar (glucose) after you have not eaten for a while (fasting). You may have this done every 1-3 years. Abdominal aortic aneurysm (AAA) screening. You may need this if you are a current or former smoker. Osteoporosis. You may be screened starting at age 53 if you are  at high risk. Talk with your health care provider about your test results, treatment options, and if necessary, the need for more tests. Vaccines  Your health care provider may recommend certain vaccines, such as: Influenza vaccine. This is recommended every year. Tetanus, diphtheria, and acellular pertussis (Tdap, Td) vaccine. You may need a Td booster every 10 years. Zoster vaccine. You may need this after age 59. Pneumococcal 13-valent conjugate (PCV13) vaccine. One dose is recommended after age 23. Pneumococcal polysaccharide (PPSV23) vaccine. One dose is recommended after age 73. Talk to your health care provider about which screenings and vaccines you need and how often you need them. This information is not intended to replace advice given to you by your health care provider. Make sure you discuss any questions you have with your health care provider. Document Released: 11/27/2015 Document Revised: 07/20/2016 Document Reviewed: 09/01/2015 Elsevier Interactive Patient Education  2017 [employer].  Fall Prevention in the Home Falls can cause injuries. They can happen to people of all ages. There are many things you can do to make your home safe and to help prevent falls. What can I do on the outside of my home? Regularly fix the edges of walkways and driveways and fix any cracks. Remove anything that might make you trip as you walk through a door, such as a raised step or threshold. Trim any bushes or trees on the path to your home. Use bright outdoor lighting. Clear any walking paths of anything that might make someone trip, such as rocks or tools. Regularly check to see if handrails are loose or broken. Make sure that both sides of any steps have handrails. Any raised decks and porches should have guardrails on the edges. Have any leaves, snow, or ice cleared regularly. Use sand or salt on walking paths during winter. Clean up any spills in your garage right away. This includes oil  or grease spills. What can I do in the bathroom? Use night lights. Install grab bars by the toilet and in the tub and shower. Do not use towel bars as grab bars. Use non-skid mats or decals in the tub or shower. If you need to sit down in the shower, use a plastic, non-slip stool. Keep the floor dry. Clean up any water that spills on the floor as soon as it happens. Remove soap buildup in the tub or shower regularly. Attach bath mats securely with double-sided non-slip rug tape. Do not have throw rugs and other things on the floor that can make you trip. What can I do in the bedroom? Use night lights. Make sure that you have a light by your bed that is easy to reach. Do not use any sheets or blankets that are too big for your bed. They should not hang down onto the floor. Have a firm chair that has side arms. You can use this for support while you get dressed. Do not have throw rugs and other things on the floor that can make you trip. What can I do in the kitchen? Clean up any spills right away. Avoid walking on  wet floors. Keep items that you use a lot in easy-to-reach places. If you need to reach something above you, use a strong step stool that has a grab bar. Keep electrical cords out of the way. Do not use floor polish or wax that makes floors slippery. If you must use wax, use non-skid floor wax. Do not have throw rugs and other things on the floor that can make you trip. What can I do with my stairs? Do not leave any items on the stairs. Make sure that there are handrails on both sides of the stairs and use them. Fix handrails that are broken or loose. Make sure that handrails are as long as the stairways. Check any carpeting to make sure that it is firmly attached to the stairs. Fix any carpet that is loose or worn. Avoid having throw rugs at the top or bottom of the stairs. If you do have throw rugs, attach them to the floor with carpet tape. Make sure that you have a light  switch at the top of the stairs and the bottom of the stairs. If you do not have them, ask someone to add them for you. What else can I do to help prevent falls? Wear shoes that: Do not have high heels. Have rubber bottoms. Are comfortable and fit you well. Are closed at the toe. Do not wear sandals. If you use a stepladder: Make sure that it is fully opened. Do not climb a closed stepladder. Make sure that both sides of the stepladder are locked into place. Ask someone to hold it for you, if possible. Clearly mark and make sure that you can see: Any grab bars or handrails. First and last steps. Where the edge of each step is. Use tools that help you move around (mobility aids) if they are needed. These include: Canes. Walkers. Scooters. Crutches. Turn on the lights when you go into a dark area. Replace any light bulbs as soon as they burn out. Set up your furniture so you have a clear path. Avoid moving your furniture around. If any of your floors are uneven, fix them. If there are any pets around you, be aware of where they are. Review your medicines with your doctor. Some medicines can make you feel dizzy. This can increase your chance of falling. Ask your doctor what other things that you can do to help prevent falls. This information is not intended to replace advice given to you by your health care provider. Make sure you discuss any questions you have with your health care provider. Document Released: 08/27/2009 Document Revised: 04/07/2016 Document Reviewed: 12/05/2014 Elsevier Interactive Patient Education  2017 [employer].

## 2023-06-05 NOTE — Progress Notes (Signed)
Subjective:   [name] is a 72 y.o. male who presents for Medicare Annual/Subsequent preventive examination.  Visit Complete: Virtual  I connected with  Danny Lawless on 06/05/23 by a audio enabled telemedicine application and verified that I am speaking with the correct person using two identifiers.  Patient Location: Home  Provider Location: Office/Clinic  I discussed the limitations of evaluation and management by telemedicine. The patient expressed understanding and agreed to proceed.  Per patient no change in vitals since last visit, unable to obtain new vitals due to telehealth visit   Review of Systems     Cardiac Risk Factors include: advanced age (>67men, >101 women);diabetes mellitus;dyslipidemia;hypertension;male gender;obesity (BMI >30kg/m2)     Objective:    Today's Vitals   06/05/23 1331  Weight: 280 lb (127 kg)  Height: 5\' 11"  (1.803 m)  PainSc: 0-No pain   Body mass index is 39.05 kg/m.     06/05/2023    1:33 PM 10/13/2020   11:17 AM 10/09/2017    2:27 PM 09/27/2017    3:17 PM 08/25/2017   11:23 AM 08/16/2017    1:51 PM 07/20/2017    2:54 PM  Advanced Directives  Does Patient Have a Medical Advance Directive? No No No No No No No  Would patient like information on creating a medical advance directive? No - Patient declined No - Patient declined  No - Patient declined No - Patient declined      Current Medications (verified) Outpatient Encounter Medications as of 06/05/2023  Medication Sig   Dulaglutide (TRULICITY) 0.75 MG/0.5ML SOPN Inject 0.75 mg into the skin once a week.   ferrous sulfate 325 (65 FE) MG tablet Take 325 mg by mouth daily with breakfast.   ketoconazole (NIZORAL) 2 % cream Apply 1 application topically daily.   Multiple Vitamin (MULTIVITAMIN) tablet Take 1 tablet by mouth daily.   olmesartan (BENICAR) 20 MG tablet Take 1 tablet (20 mg total) by mouth daily.   rosuvastatin (CRESTOR) 10 MG tablet Take 1 tablet (10 mg total) by  mouth daily.   No facility-administered encounter medications on file as of 06/05/2023.    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   Gastric outlet obstruction 01/2017   GERD (gastroesophageal reflux disease)    Pre-diabetes    denies   SVT (supraventricular tachycardia)    during Admission and Surgery- 01/2017   Past Surgical History:  Procedure Laterality Date   CHOLECYSTECTOMY N/A 01/10/2017   Procedure: CHOLECYSTECTOMY;  Surgeon: Almond Lint, MD;  Location: [facility] OR;  Service: General;  Laterality: N/A;   COLONOSCOPY     COLONOSCOPY  10/11/2011   Procedure: COLONOSCOPY;  Surgeon: Corbin Ade, MD;  Location: [facility];  Service: Endoscopy;  Laterality: N/A;  10:30 AM   ESOPHAGOGASTRODUODENOSCOPY N/A 12/30/2016   Procedure: ESOPHAGOGASTRODUODENOSCOPY (EGD);  Surgeon: Jeani Hawking, MD;  Location: [facility];  Service: Endoscopy;  Laterality: N/A;   GASTRECTOMY N/A 01/10/2017   Procedure: DISTAL GASTRECTOMY, JEJUNUM  FEEDING TUBE PLACEMENT;  Surgeon: Almond Lint, MD;  Location: [facility] OR;  Service: General;  Laterality: N/A;   IR GENERIC HISTORICAL  01/24/2017   IR REPLC DUODEN/JEJUNO TUBE PERCUT W/FLUORO 01/24/2017 Irish Lack, MD [facility]   IR [facility] DUODEN/JEJUNO TUBE PERCUT W/FLUORO  03/26/2017   PORTACATH PLACEMENT N/A 02/23/2017   Procedure: INSERTION PORT-A-CATH;  Surgeon: Almond Lint, MD;  Location: [facility]  OR;  Service: General;  Laterality: N/A;   testicle removed  1982   Family History  Problem Relation Age of Onset   Prostate cancer Father    Colon cancer Neg Hx    Social History   Socioeconomic History   Marital status: Married    Spouse name: Not on file   Number of children: Not on file   Years of education: Not on file   Highest education level: Not on file  Occupational History   Not on file  Tobacco Use   Smoking status: Never    Smokeless tobacco: Never  Vaping Use   Vaping status: Unknown  Substance and Sexual Activity   Alcohol use: No    Comment: occ beer or wine but none in last 6 months    Drug use: No   Sexual activity: Not on file  Other Topics Concern   Not on file  Social History Narrative   Not on file   Social Determinants of Health   Financial Resource Strain: Low Risk  (06/05/2023)   Overall Financial Resource Strain (CARDIA)    Difficulty of Paying Living Expenses: Not hard at all  Food Insecurity: No Food Insecurity (06/05/2023)   Hunger Vital Sign    Worried About Running Out of Food in the Last Year: Never true    Ran Out of Food in the Last Year: Never true  Transportation Needs: No Transportation Needs (06/05/2023)   PRAPARE - Administrator, Civil Service (Medical): No    Lack of Transportation (Non-Medical): No  Physical Activity: Sufficiently Active (06/05/2023)   Exercise Vital Sign    Days of Exercise per Week: 7 days    Minutes of Exercise per Session: 30 min  Stress: No Stress Concern Present (06/05/2023)   [employer] of Occupational Health - Occupational Stress Questionnaire    Feeling of Stress : Not at all  Social Connections: Socially Integrated (06/05/2023)   Social Connection and Isolation Panel [NHANES]    Frequency of Communication with Friends and Family: More than three times a week    Frequency of Social Gatherings with Friends and Family: More than three times a week    Attends Religious Services: More than 4 times per year    Active Member of [employer] or Organizations: Yes    Attends Engineer, structural: More than 4 times per year    Marital Status: Married    Tobacco Counseling Counseling given: Not Answered   Clinical Intake:  Pre-visit preparation completed: Yes  Pain : No/denies pain Pain Score: 0-No pain     BMI - recorded: 39.05 Nutritional Status: BMI > 30  Obese Nutritional Risks: None Diabetes: Yes CBG done?:  No Did pt. bring in CBG monitor from home?: No  How often do you need to have someone help you when you read instructions, pamphlets, or other written materials from your doctor or pharmacy?: 1 - Never What is the last grade level you completed in school?: POST-GRADUATE  Interpreter Needed?: No  Information entered by :: Nickia Boesen N. Montague Corella, LPN.   Activities of Daily Living    06/05/2023    1:36 PM  In your present state of health, do you have any difficulty performing the following activities:  Hearing? 0  Vision? 0  Difficulty concentrating or making decisions? 0  Walking or climbing stairs? 0  Dressing or bathing? 0  Doing errands, shopping? 0  Preparing Food and eating ? N  Using the Toilet? N  In the past six months, have you accidently leaked urine? N  Do you have problems with loss of bowel control? N  Managing your Medications? N  Managing your Finances? N  Housekeeping or managing your Housekeeping? N    Patient Care Team: Georgina Quint, MD as PCP - General (Internal Medicine)  Indicate any recent Medical Services you may have received from other than [facility] providers in the past year (date may be approximate).     Assessment:   This is a routine wellness examination for Pilot.  Hearing/Vision screen Hearing Screening - Comments:: Denies hearing difficulties; no hearing aids.   Vision Screening - Comments:: Wears rx glasses - patient stated he is up to date with routine eye exams.  Patient could not remember eye doctor name.    Dietary issues and exercise activities discussed:     Goals Addressed             This Visit's Progress    My healthcare goal for 2024 is to maintain my health by staying healthy.        Depression Screen    06/05/2023    1:35 PM 03/20/2023    1:52 PM 09/06/2022    2:23 PM 06/06/2022    2:29 PM 11/01/2021    1:26 PM 06/15/2021    2:59 PM 10/09/2017    2:27 PM  PHQ 2/9 Scores  PHQ - 2 Score 0 0 0 0 0 0 0  PHQ- 9  Score 0          Fall Risk    06/05/2023    1:34 PM 03/20/2023    1:52 PM 09/06/2022    2:23 PM 06/06/2022    2:29 PM 11/01/2021    1:26 PM  Fall Risk   Falls in the past year? 0 0 0 0 0  Number falls in past yr: 0 0 0  0  Injury with Fall? 0 0 0  0  Risk for fall due to : No Fall Risks No Fall Risks No Fall Risks    Follow up Falls prevention discussed Falls evaluation completed Falls evaluation completed      MEDICARE RISK AT HOME:  Medicare Risk at Home - 06/05/23 1334     Any stairs in or around the home? Yes    If so, are there any without handrails? No    Home free of loose throw rugs in walkways, pet beds, electrical cords, etc? Yes    Adequate lighting in your home to reduce risk of falls? Yes    Life alert? No    Use of a cane, walker or w/c? No    Grab bars in the bathroom? Yes    Shower chair or bench in shower? Yes    Elevated toilet seat or a handicapped toilet? Yes             TIMED UP AND GO:  Was the test performed?  No    Cognitive Function:        06/05/2023    1:37 PM  6CIT Screen  What Year? 0 points  What month? 0 points  What time? 0 points  Count back from 20 0 points  Months in reverse 0 points  Repeat phrase 0 points  Total Score 0 points    Immunizations Immunization History  Administered Date(s) Administered   Covid-19, Mrna,Vaccine(Spikevax)50yrs and older 09/06/2022   Fluad Quad(high Dose 65+) 10/13/2020, 09/06/2022   Influenza, High Dose Seasonal PF 09/05/2018  Influenza,inj,Quad PF,6+ Mos 11/17/2017   Influenza-Unspecified 10/01/2021   Moderna SARS-COV2 Booster Vaccination 10/01/2021   Moderna Sars-Covid-2 Vaccination 12/10/2019, 01/07/2020, 10/01/2020, 03/04/2021   Pneumococcal Conjugate-13 01/16/2018   Pneumococcal Polysaccharide-23 01/14/2017   RSV,unspecified 11/01/2022    TDAP status: Due, Education has been provided regarding the importance of this vaccine. Advised may receive this vaccine at local pharmacy or  Health Dept. Aware to provide a copy of the vaccination record if obtained from local pharmacy or Health Dept. Verbalized acceptance and understanding.  Flu Vaccine status: Up to date  Pneumococcal vaccine status: Up to date  Covid-19 vaccine status: Completed vaccines  Qualifies for Shingles Vaccine? Yes   Zostavax completed No   Shingrix Completed?: No.    Education has been provided regarding the importance of this vaccine. Patient has been advised to call insurance company to determine out of pocket expense if they have not yet received this vaccine. Advised may also receive vaccine at local pharmacy or Health Dept. Verbalized acceptance and understanding.  Screening Tests Health Maintenance  Topic Date Due   OPHTHALMOLOGY EXAM  Never done   Hepatitis C Screening  Never done   DTaP/Tdap/Td (1 - Tdap) Never done   Zoster Vaccines- Shingrix (1 of 2) Never done   Colonoscopy  10/10/2021   Diabetic kidney evaluation - Urine ACR  06/15/2022   FOOT EXAM  06/15/2022   COVID-19 Vaccine (6 - 2023-24 season) 11/01/2022   INFLUENZA VACCINE  06/15/2023   HEMOGLOBIN A1C  09/20/2023   Diabetic kidney evaluation - eGFR measurement  12/13/2023   Medicare Annual Wellness (AWV)  06/04/2024   Pneumonia Vaccine 2+ Years old  Completed   HPV VACCINES  Aged Out    Health Maintenance  Health Maintenance Due  Topic Date Due   OPHTHALMOLOGY EXAM  Never done   Hepatitis C Screening  Never done   DTaP/Tdap/Td (1 - Tdap) Never done   Zoster Vaccines- Shingrix (1 of 2) Never done   Colonoscopy  10/10/2021   Diabetic kidney evaluation - Urine ACR  06/15/2022   FOOT EXAM  06/15/2022   COVID-19 Vaccine (6 - 2023-24 season) 11/01/2022    Colorectal cancer screening: Order placed for Cologuard on 03/20/2023.  Lung Cancer Screening: (Low Dose CT Chest recommended if Age 32-80 years, 20 pack-year currently smoking OR have quit w/in 15years.) does not qualify.   Lung Cancer Screening Referral:  no  Additional Screening:  Hepatitis C Screening: does qualify; Completed: no  Vision Screening: Recommended annual ophthalmology exams for early detection of glaucoma and other disorders of the eye. Is the patient up to date with their annual eye exam?  Yes  Who is the provider or what is the name of the office in which the patient attends annual eye exams? Patient could not remember the name of optometrist If pt is not established with a provider, would they like to be referred to a provider to establish care? No .   Dental Screening: Recommended annual dental exams for proper oral hygiene  Diabetic Foot Exam: Diabetic Foot Exam: Overdue, Pt has been advised about the importance in completing this exam. Pt is scheduled for diabetic foot exam on 09/18/2023.  Community Resource Referral / Chronic Care Management: CRR required this visit?  No   CCM required this visit?  No     Plan:     I have personally reviewed and noted the following in the patient's chart:   Medical and social history Use of alcohol, tobacco or illicit  drugs  Current medications and supplements including opioid prescriptions. Patient is not currently taking opioid prescriptions. Functional ability and status Nutritional status Physical activity Advanced directives List of other physicians Hospitalizations, surgeries, and ER visits in previous 12 months Vitals Screenings to include cognitive, depression, and falls Referrals and appointments  In addition, I have reviewed and discussed with patient certain preventive protocols, quality metrics, and best practice recommendations. A written personalized care plan for preventive services as well as general preventive health recommendations were provided to patient.     Mickeal Needy, LPN   1/61/0960   After Visit Summary: (Mail) Due to this being a telephonic visit, the after visit summary with patients personalized plan was offered to patient via mail    Nurse Notes: Normal cognitive status assessed by direct observation via telephone conversation by this Nurse Health Advisor. No abnormalities found.

## 2023-06-19 ENCOUNTER — Other Ambulatory Visit: Payer: Self-pay | Admitting: *Deleted

## 2023-06-19 DIAGNOSIS — I10 Essential (primary) hypertension: Secondary | ICD-10-CM

## 2023-06-19 DIAGNOSIS — E118 Type 2 diabetes mellitus with unspecified complications: Secondary | ICD-10-CM

## 2023-06-19 DIAGNOSIS — E785 Hyperlipidemia, unspecified: Secondary | ICD-10-CM

## 2023-06-19 MED ORDER — OLMESARTAN MEDOXOMIL 20 MG PO TABS: 20.0000 mg | ORAL_TABLET | Freq: Every day | ORAL | 3 refills | Status: DC

## 2023-06-19 MED ORDER — ROSUVASTATIN CALCIUM 10 MG PO TABS: 10.0000 mg | ORAL_TABLET | Freq: Every day | ORAL | 3 refills | Status: DC

## 2023-07-15 DIAGNOSIS — Z23 Encounter for immunization: Secondary | ICD-10-CM | POA: Diagnosis not present

## 2023-07-18 ENCOUNTER — Encounter: Payer: Self-pay | Admitting: Emergency Medicine

## 2023-09-18 ENCOUNTER — Ambulatory Visit: Payer: Medicare Other | Admitting: Emergency Medicine

## 2023-09-21 ENCOUNTER — Ambulatory Visit: Payer: Medicare Other | Admitting: Emergency Medicine

## 2023-09-21 ENCOUNTER — Inpatient Hospital Stay: Payer: Medicare Other | Attending: Internal Medicine

## 2023-09-21 ENCOUNTER — Encounter: Payer: Self-pay | Admitting: Emergency Medicine

## 2023-09-21 VITALS — BP 118/72 | HR 66 | Temp 98.0°F | Ht 71.0 in | Wt 300.2 lb

## 2023-09-21 DIAGNOSIS — C169 Malignant neoplasm of stomach, unspecified: Secondary | ICD-10-CM | POA: Diagnosis not present

## 2023-09-21 DIAGNOSIS — E1159 Type 2 diabetes mellitus with other circulatory complications: Secondary | ICD-10-CM

## 2023-09-21 DIAGNOSIS — E1169 Type 2 diabetes mellitus with other specified complication: Secondary | ICD-10-CM | POA: Diagnosis not present

## 2023-09-21 DIAGNOSIS — Z7189 Other specified counseling: Secondary | ICD-10-CM

## 2023-09-21 DIAGNOSIS — I152 Hypertension secondary to endocrine disorders: Secondary | ICD-10-CM

## 2023-09-21 DIAGNOSIS — C160 Malignant neoplasm of cardia: Secondary | ICD-10-CM

## 2023-09-21 DIAGNOSIS — E785 Hyperlipidemia, unspecified: Secondary | ICD-10-CM | POA: Diagnosis not present

## 2023-09-21 DIAGNOSIS — Z452 Encounter for adjustment and management of vascular access device: Secondary | ICD-10-CM | POA: Insufficient documentation

## 2023-09-21 DIAGNOSIS — Z794 Long term (current) use of insulin: Secondary | ICD-10-CM | POA: Diagnosis not present

## 2023-09-21 DIAGNOSIS — Z95828 Presence of other vascular implants and grafts: Secondary | ICD-10-CM

## 2023-09-21 LAB — CBC WITH DIFFERENTIAL/PLATELET
Basophils Absolute: 0 10*3/uL (ref 0.0–0.1)
Basophils Relative: 0.6 % (ref 0.0–3.0)
Eosinophils Absolute: 0.1 10*3/uL (ref 0.0–0.7)
Eosinophils Relative: 1.1 % (ref 0.0–5.0)
HCT: 37.3 % — ABNORMAL LOW (ref 39.0–52.0)
Hemoglobin: 12.4 g/dL — ABNORMAL LOW (ref 13.0–17.0)
Lymphocytes Relative: 21.9 % (ref 12.0–46.0)
Lymphs Abs: 1.2 10*3/uL (ref 0.7–4.0)
MCHC: 33.3 g/dL (ref 30.0–36.0)
MCV: 86.3 fL (ref 78.0–100.0)
Monocytes Absolute: 0.4 10*3/uL (ref 0.1–1.0)
Monocytes Relative: 7.1 % (ref 3.0–12.0)
Neutro Abs: 3.6 10*3/uL (ref 1.4–7.7)
Neutrophils Relative %: 69.3 % (ref 43.0–77.0)
Platelets: 173 10*3/uL (ref 150.0–400.0)
RBC: 4.32 Mil/uL (ref 4.22–5.81)
RDW: 13.5 % (ref 11.5–15.5)
WBC: 5.3 10*3/uL (ref 4.0–10.5)

## 2023-09-21 LAB — COMPREHENSIVE METABOLIC PANEL
ALT: 26 U/L (ref 0–53)
AST: 24 U/L (ref 0–37)
Albumin: 4.1 g/dL (ref 3.5–5.2)
Alkaline Phosphatase: 65 U/L (ref 39–117)
BUN: 14 mg/dL (ref 6–23)
CO2: 28 meq/L (ref 19–32)
Calcium: 9.5 mg/dL (ref 8.4–10.5)
Chloride: 102 meq/L (ref 96–112)
Creatinine, Ser: 1.01 mg/dL (ref 0.40–1.50)
GFR: 73.71 mL/min (ref >60.00)
Glucose, Bld: 171 mg/dL — ABNORMAL HIGH (ref 70–99)
Potassium: 4.3 meq/L (ref 3.5–5.1)
Sodium: 136 meq/L (ref 135–145)
Total Bilirubin: 0.5 mg/dL (ref 0.2–1.2)
Total Protein: 7.6 g/dL (ref 6.0–8.3)

## 2023-09-21 LAB — LIPID PANEL
Cholesterol: 124 mg/dL (ref 0–200)
HDL: 39.1 mg/dL (ref >39.00)
LDL Cholesterol: 54 mg/dL (ref 0–99)
NonHDL: 84.8
Total CHOL/HDL Ratio: 3
Triglycerides: 154 mg/dL — ABNORMAL HIGH (ref 0.0–149.0)
VLDL: 30.8 mg/dL (ref 0.0–40.0)

## 2023-09-21 LAB — HEMOGLOBIN A1C: Hgb A1c MFr Bld: 7.7 % — ABNORMAL HIGH (ref 4.6–6.5)

## 2023-09-21 LAB — MICROALBUMIN / CREATININE URINE RATIO
Creatinine,U: 151.8 mg/dL
Microalb Creat Ratio: 1.5 mg/g (ref 0.0–30.0)
Microalb, Ur: 2.3 mg/dL — ABNORMAL HIGH (ref 0.0–1.9)

## 2023-09-21 MED ORDER — HEPARIN SOD (PORK) LOCK FLUSH 100 UNIT/ML IV SOLN
500.0000 [IU] | Freq: Once | INTRAVENOUS | Status: AC | PRN
  Administered 2023-09-21: 500 [IU] via INTRAVENOUS

## 2023-09-21 MED ORDER — KETOCONAZOLE 2 % EX CREA: 1.0000 | TOPICAL_CREAM | Freq: Every day | CUTANEOUS | 5 refills | Status: AC

## 2023-09-21 MED ORDER — SODIUM CHLORIDE 0.9% FLUSH
10.0000 mL | INTRAVENOUS | Status: DC | PRN
Start: 2023-09-21 — End: 2023-09-21
  Administered 2023-09-21: 10 mL via INTRAVENOUS

## 2023-09-21 MED ORDER — TRULICITY 0.75 MG/0.5ML ~~LOC~~ SOAJ: 0.7500 mg | SUBCUTANEOUS | 6 refills | Status: DC

## 2023-09-21 NOTE — Progress Notes (Signed)
[name] 72 y.o.   Chief Complaint  Patient presents with   Medical Management of Chronic Issues    6 month f/u. DM foot exam. Medication refills for Trulicity and Ketoconazole     HISTORY OF PRESENT ILLNESS: This is a 72 y.o. male here for 51-month follow-up of chronic medical conditions including diabetes and hypertension Overall doing well.  Has no complaints or medical concerns today. Wt Readings from Last 3 Encounters:  09/21/23 (!) 300 lb 3.2 oz (136.2 kg)  06/05/23 280 lb (127 kg)  03/20/23 (!) 301 lb (136.5 kg)   Lab Results  Component Value Date   HGBA1C 6.6 (A) 03/20/2023     HPI   Prior to Admission medications   Medication Sig Start Date End Date Taking? Authorizing Provider  Dulaglutide (TRULICITY) 0.75 MG/0.5ML SOPN Inject 0.75 mg into the skin once a week. 03/20/23  Yes Adan Baehr, Eilleen Kempf, MD  ferrous sulfate 325 (65 FE) MG tablet Take 325 mg by mouth daily with breakfast.   Yes [provider]  ketoconazole (NIZORAL) 2 % cream Apply 1 application topically daily. 11/01/21  Yes Buena Boehm, Eilleen Kempf, MD  Multiple Vitamin (MULTIVITAMIN) tablet Take 1 tablet by mouth daily.   Yes [provider]  olmesartan (BENICAR) 20 MG tablet Take 1 tablet (20 mg total) by mouth daily. 06/19/23  Yes Cephas Revard, Eilleen Kempf, MD  rosuvastatin (CRESTOR) 10 MG tablet Take 1 tablet (10 mg total) by mouth daily. 06/19/23  Yes Georgina Quint, MD    Allergies  Allergen Reactions   No Known Allergies     Patient Active Problem List   Diagnosis Date Noted   Diabetic polyneuropathy associated with type 2 diabetes mellitus (HCC) 06/15/2021   Hyperlipidemia with target LDL less than 100 06/15/2021   Dyslipidemia associated with type 2 diabetes mellitus (HCC) 06/15/2021   Numbness and tingling of both feet 06/15/2021   Hypertension associated with diabetes (HCC) 06/15/2021   Abnormal electrocardiogram (ECG) (EKG) 06/15/2021   Counseling regarding advance  care planning and goals of care 08/13/2018   Port catheter in place 08/03/2017   GERD (gastroesophageal reflux disease) 03/25/2017   Anemia 03/25/2017   NSVT (nonsustained ventricular tachycardia) (HCC)    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    Past Medical History:  Diagnosis Date   Anemia    Cancer (HCC) dx'd 11/2016   Gastric Adenocarcinoma   Gastric outlet obstruction 01/2017   GERD (gastroesophageal reflux disease)    Pre-diabetes    denies   SVT (supraventricular tachycardia)    during Admission and Surgery- 01/2017    Past Surgical History:  Procedure Laterality Date   CHOLECYSTECTOMY N/A 01/10/2017   Procedure: CHOLECYSTECTOMY;  Surgeon: Almond Lint, MD;  Location: [facility] OR;  Service: General;  Laterality: N/A;   COLONOSCOPY     COLONOSCOPY  10/11/2011   Procedure: COLONOSCOPY;  Surgeon: Corbin Ade, MD;  Location: [facility];  Service: Endoscopy;  Laterality: N/A;  10:30 AM   ESOPHAGOGASTRODUODENOSCOPY N/A 12/30/2016   Procedure: ESOPHAGOGASTRODUODENOSCOPY (EGD);  Surgeon: Jeani Hawking, MD;  Location: [facility];  Service: Endoscopy;  Laterality: N/A;   GASTRECTOMY N/A 01/10/2017   Procedure: DISTAL GASTRECTOMY, JEJUNUM  FEEDING TUBE PLACEMENT;  Surgeon: Almond Lint, MD;  Location: [facility] OR;  Service: General;  Laterality: N/A;   IR GENERIC HISTORICAL  01/24/2017   IR REPLC DUODEN/JEJUNO TUBE PERCUT W/FLUORO 01/24/2017 Irish Lack, MD [facility]   IR [facility] DUODEN/JEJUNO TUBE Cristi Loron Amedeo Plenty  03/26/2017  PORTACATH PLACEMENT N/A 02/23/2017   Procedure: INSERTION PORT-A-CATH;  Surgeon: Almond Lint, MD;  Location: [facility] OR;  Service: General;  Laterality: N/A;   testicle removed  1982    Social History   Socioeconomic History   Marital status: Married    Spouse name: Not on file   Number of children: Not on file   Years of education: Not on file   Highest education level: Not on file  Occupational History   Not on file  Tobacco Use    Smoking status: Never   Smokeless tobacco: Never  Vaping Use   Vaping status: Unknown  Substance and Sexual Activity   Alcohol use: No    Comment: occ beer or wine but none in last 6 months    Drug use: No   Sexual activity: Not on file  Other Topics Concern   Not on file  Social History Narrative   Not on file   Social Determinants of Health   Financial Resource Strain: Low Risk  (06/05/2023)   Overall Financial Resource Strain (CARDIA)    Difficulty of Paying Living Expenses: Not hard at all  Food Insecurity: No Food Insecurity (06/05/2023)   Hunger Vital Sign    Worried About Running Out of Food in the Last Year: Never true    Ran Out of Food in the Last Year: Never true  Transportation Needs: No Transportation Needs (06/05/2023)   PRAPARE - Administrator, Civil Service (Medical): No    Lack of Transportation (Non-Medical): No  Physical Activity: Sufficiently Active (06/05/2023)   Exercise Vital Sign    Days of Exercise per Week: 7 days    Minutes of Exercise per Session: 30 min  Stress: No Stress Concern Present (06/05/2023)   [employer] of Occupational Health - Occupational Stress Questionnaire    Feeling of Stress : Not at all  Social Connections: Socially Integrated (06/05/2023)   Social Connection and Isolation Panel [NHANES]    Frequency of Communication with Friends and Family: More than three times a week    Frequency of Social Gatherings with Friends and Family: More than three times a week    Attends Religious Services: More than 4 times per year    Active Member of [employer] or Organizations: Yes    Attends Engineer, structural: More than 4 times per year    Marital Status: Married  Catering manager Violence: Not At Risk (06/05/2023)   Humiliation, Afraid, Rape, and Kick questionnaire    Fear of Current or Ex-Partner: No    Emotionally Abused: No    Physically Abused: No    Sexually Abused: No    Family History  Problem Relation Age of  Onset   Prostate cancer Father    Colon cancer Neg Hx      Review of Systems  Constitutional: Negative.  Negative for chills and fever.  HENT: Negative.  Negative for congestion and sore throat.   Respiratory: Negative.  Negative for cough and shortness of breath.   Cardiovascular: Negative.  Negative for chest pain and palpitations.  Gastrointestinal:  Negative for abdominal pain, diarrhea, nausea and vomiting.  Genitourinary: Negative.  Negative for dysuria and hematuria.  Skin: Negative.  Negative for rash.  Neurological: Negative.  Negative for dizziness and headaches.  All other systems reviewed and are negative.   Vitals:   09/21/23 1339  BP: 118/72  Pulse: 66  Temp: 98 F (36.7 C)  SpO2: 96%    Physical Exam Vitals  reviewed.  Constitutional:      Appearance: Normal appearance. He is obese.  HENT:     Head: Normocephalic.  Eyes:     Extraocular Movements: Extraocular movements intact.  Cardiovascular:     Rate and Rhythm: Normal rate and regular rhythm.     Pulses: Normal pulses.     Heart sounds: Normal heart sounds.  Pulmonary:     Effort: Pulmonary effort is normal.     Breath sounds: Normal breath sounds.  Musculoskeletal:     Cervical back: No tenderness.  Lymphadenopathy:     Cervical: No cervical adenopathy.  Skin:    General: Skin is warm and dry.     Capillary Refill: Capillary refill takes less than 2 seconds.  Neurological:     General: No focal deficit present.     Mental Status: He is alert and oriented to person, place, and time.  Psychiatric:        Mood and Affect: Mood normal.        Behavior: Behavior normal.      ASSESSMENT & PLAN: A total of 46 minutes was spent with the patient and counseling/coordination of care regarding preparing for this visit, review of most recent office visit notes, review of multiple chronic medical conditions under management, review of all medications, review of most recent blood work results, review of  health maintenance items, education on nutrition, cardiovascular risks associated with hypertension and diabetes, prognosis, documentation, and need for follow-up.  Problem List Items Addressed This Visit       Cardiovascular and Mediastinum   Hypertension associated with diabetes (HCC) - Primary    Well-controlled hypertension Continue olmesartan 20 mg daily Well-controlled diabetes Continue weekly Trulicity 0.75 mg Cardiovascular risks associated with hypertension and diabetes discussed Blood work done today Diet and nutrition discussed Follow-up 6 months      Relevant Medications   Dulaglutide (TRULICITY) 0.75 MG/0.5ML SOAJ   Other Relevant Orders   Urine Microalbumin w/creat. ratio   CBC with Differential/Platelet   Comprehensive metabolic panel   Hemoglobin A1c   Lipid panel     Endocrine   Dyslipidemia associated with type 2 diabetes mellitus (HCC)    Chronic stable conditions Continue weekly Trulicity and rosuvastatin 10 mg daily Diet and nutrition discussed Blood work done today Follow-up in 6 months      Relevant Medications   Dulaglutide (TRULICITY) 0.75 MG/0.5ML SOAJ   Other Relevant Orders   Urine Microalbumin w/creat. ratio   CBC with Differential/Platelet   Comprehensive metabolic panel   Hemoglobin A1c   Lipid panel   Patient Instructions  Diabetes Mellitus and Nutrition, Adult When you have diabetes, or diabetes mellitus, it is very important to have healthy eating habits because your blood sugar (glucose) levels are greatly affected by what you eat and drink. Eating healthy foods in the right amounts, at about the same times every day, can help you: Manage your blood glucose. Lower your risk of heart disease. Improve your blood pressure. Reach or maintain a healthy weight. What can affect my meal plan? Every person with diabetes is different, and each person has different needs for a meal plan. Your health care provider may recommend that you work  with a dietitian to make a meal plan that is best for you. Your meal plan may vary depending on factors such as: The calories you need. The medicines you take. Your weight. Your blood glucose, blood pressure, and cholesterol levels. Your activity level. Other health conditions you have,  such as heart or kidney disease. How do carbohydrates affect me? Carbohydrates, also called carbs, affect your blood glucose level more than any other type of food. Eating carbs raises the amount of glucose in your blood. It is important to know how many carbs you can safely have in each meal. This is different for every person. Your dietitian can help you calculate how many carbs you should have at each meal and for each snack. How does alcohol affect me? Alcohol can cause a decrease in blood glucose (hypoglycemia), especially if you use insulin or take certain diabetes medicines by mouth. Hypoglycemia can be a life-threatening condition. Symptoms of hypoglycemia, such as sleepiness, dizziness, and confusion, are similar to symptoms of having too much alcohol. Do not drink alcohol if: Your health care provider tells you not to drink. You are pregnant, may be pregnant, or are planning to become pregnant. If you drink alcohol: Limit how much you have to: 0-1 drink a day for women. 0-2 drinks a day for men. Know how much alcohol is in your drink. In the [place] one drink equals one 12 oz bottle of beer (355 mL), one 5 oz glass of wine (148 mL), or one 1 oz glass of hard liquor (44 mL). Keep yourself hydrated with water, diet soda, or unsweetened iced tea. Keep in mind that regular soda, juice, and other mixers may contain a lot of sugar and must be counted as carbs. What are tips for following this plan?  Reading food labels Start by checking the serving size on the Nutrition Facts label of packaged foods and drinks. The number of calories and the amount of carbs, fats, and other nutrients listed on the label are  based on one serving of the item. Many items contain more than one serving per package. Check the total grams (g) of carbs in one serving. Check the number of grams of saturated fats and trans fats in one serving. Choose foods that have a low amount or none of these fats. Check the number of milligrams (mg) of salt (sodium) in one serving. Most people should limit total sodium intake to less than 2,300 mg per day. Always check the nutrition information of foods labeled as "low-fat" or "nonfat." These foods may be higher in added sugar or refined carbs and should be avoided. Talk to your dietitian to identify your daily goals for nutrients listed on the label. Shopping Avoid buying canned, pre-made, or processed foods. These foods tend to be high in fat, sodium, and added sugar. Shop around the outside edge of the grocery store. This is where you will most often find fresh fruits and vegetables, bulk grains, fresh meats, and fresh dairy products. Cooking Use low-heat cooking methods, such as baking, instead of high-heat cooking methods, such as deep frying. Cook using healthy oils, such as olive, canola, or sunflower oil. Avoid cooking with butter, cream, or high-fat meats. Meal planning Eat meals and snacks regularly, preferably at the same times every day. Avoid going long periods of time without eating. Eat foods that are high in fiber, such as fresh fruits, vegetables, beans, and whole grains. Eat 4-6 oz (112-168 g) of lean protein each day, such as lean meat, chicken, fish, eggs, or tofu. One ounce (oz) (28 g) of lean protein is equal to: 1 oz (28 g) of meat, chicken, or fish. 1 egg.  cup (62 g) of tofu. Eat some foods each day that contain healthy fats, such as avocado, nuts, seeds, and fish.  What foods should I eat? Fruits Berries. Apples. Oranges. Peaches. Apricots. Plums. Grapes. Mangoes. Papayas. Pomegranates. Kiwi. Cherries. Vegetables Leafy greens, including lettuce, spinach,  kale, chard, collard greens, mustard greens, and cabbage. Beets. Cauliflower. Broccoli. Carrots. Green beans. Tomatoes. Peppers. Onions. Cucumbers. Brussels sprouts. Grains Whole grains, such as whole-wheat or whole-grain bread, crackers, tortillas, cereal, and pasta. Unsweetened oatmeal. Quinoa. Brown or wild rice. Meats and other proteins Seafood. Poultry without skin. Lean cuts of poultry and beef. Tofu. Nuts. Seeds. Dairy Low-fat or fat-free dairy products such as milk, yogurt, and cheese. The items listed above may not be a complete list of foods and beverages you can eat and drink. Contact a dietitian for more information. What foods should I avoid? Fruits Fruits canned with syrup. Vegetables Canned vegetables. Frozen vegetables with butter or cream sauce. Grains Refined white flour and flour products such as bread, pasta, snack foods, and cereals. Avoid all processed foods. Meats and other proteins Fatty cuts of meat. Poultry with skin. Breaded or fried meats. Processed meat. Avoid saturated fats. Dairy Full-fat yogurt, cheese, or milk. Beverages Sweetened drinks, such as soda or iced tea. The items listed above may not be a complete list of foods and beverages you should avoid. Contact a dietitian for more information. Questions to ask a health care provider Do I need to meet with a certified diabetes care and education specialist? Do I need to meet with a dietitian? What number can I call if I have questions? When are the best times to check my blood glucose? Where to find more information: American Diabetes Association: diabetes.org Academy of Nutrition and Dietetics: eatright.[employer] of Diabetes and Digestive and Kidney Diseases: StageSync.si Association of Diabetes Care & Education Specialists: diabeteseducator.org Summary It is important to have healthy eating habits because your blood sugar (glucose) levels are greatly affected by what you eat and drink.  It is important to use alcohol carefully. A healthy meal plan will help you manage your blood glucose and lower your risk of heart disease. Your health care provider may recommend that you work with a dietitian to make a meal plan that is best for you. This information is not intended to replace advice given to you by your health care provider. Make sure you discuss any questions you have with your health care provider. Document Revised: 06/03/2020 Document Reviewed: 06/03/2020 Elsevier Patient Education  2024 Elsevier Inc.     Edwina Barth, MD [facility]

## 2023-09-21 NOTE — Patient Instructions (Signed)

## 2023-09-21 NOTE — Assessment & Plan Note (Signed)
Well-controlled hypertension Continue olmesartan 20 mg daily Well-controlled diabetes Continue weekly Trulicity 0.75 mg Cardiovascular risks associated with hypertension and diabetes discussed Blood work done today Diet and nutrition discussed Follow-up 6 months

## 2023-09-21 NOTE — Assessment & Plan Note (Signed)
Chronic stable conditions Continue weekly Trulicity and rosuvastatin 10 mg daily Diet and nutrition discussed Blood work done today Follow-up in 6 months

## 2024-03-13 ENCOUNTER — Encounter: Payer: Self-pay | Admitting: Hematology

## 2024-03-20 ENCOUNTER — Ambulatory Visit: Payer: Medicare Other | Admitting: Emergency Medicine

## 2024-03-20 ENCOUNTER — Encounter: Payer: Self-pay | Admitting: Emergency Medicine

## 2024-03-20 VITALS — HR 76 | Temp 98.1°F | Ht 71.0 in | Wt 296.5 lb

## 2024-03-20 DIAGNOSIS — I10 Essential (primary) hypertension: Secondary | ICD-10-CM | POA: Diagnosis not present

## 2024-03-20 DIAGNOSIS — C160 Malignant neoplasm of cardia: Secondary | ICD-10-CM

## 2024-03-20 DIAGNOSIS — E1142 Type 2 diabetes mellitus with diabetic polyneuropathy: Secondary | ICD-10-CM | POA: Diagnosis not present

## 2024-03-20 DIAGNOSIS — E785 Hyperlipidemia, unspecified: Secondary | ICD-10-CM | POA: Diagnosis not present

## 2024-03-20 DIAGNOSIS — I152 Hypertension secondary to endocrine disorders: Secondary | ICD-10-CM | POA: Diagnosis not present

## 2024-03-20 DIAGNOSIS — E1169 Type 2 diabetes mellitus with other specified complication: Secondary | ICD-10-CM | POA: Diagnosis not present

## 2024-03-20 DIAGNOSIS — Z7984 Long term (current) use of oral hypoglycemic drugs: Secondary | ICD-10-CM

## 2024-03-20 DIAGNOSIS — K219 Gastro-esophageal reflux disease without esophagitis: Secondary | ICD-10-CM | POA: Diagnosis not present

## 2024-03-20 DIAGNOSIS — E1159 Type 2 diabetes mellitus with other circulatory complications: Secondary | ICD-10-CM

## 2024-03-20 DIAGNOSIS — E118 Type 2 diabetes mellitus with unspecified complications: Secondary | ICD-10-CM

## 2024-03-20 DIAGNOSIS — Z7985 Long-term (current) use of injectable non-insulin antidiabetic drugs: Secondary | ICD-10-CM

## 2024-03-20 LAB — LIPID PANEL
Cholesterol: 115 mg/dL (ref 0–200)
HDL: 37.7 mg/dL — ABNORMAL LOW (ref >39.00)
LDL Cholesterol: 49 mg/dL (ref 0–99)
NonHDL: 77.39
Total CHOL/HDL Ratio: 3
Triglycerides: 142 mg/dL (ref 0.0–149.0)
VLDL: 28.4 mg/dL (ref 0.0–40.0)

## 2024-03-20 LAB — COMPREHENSIVE METABOLIC PANEL WITH GFR
ALT: 25 U/L (ref 0–53)
AST: 21 U/L (ref 0–37)
Albumin: 4 g/dL (ref 3.5–5.2)
Alkaline Phosphatase: 60 U/L (ref 39–117)
BUN: 19 mg/dL (ref 6–23)
CO2: 26 meq/L (ref 19–32)
Calcium: 9.3 mg/dL (ref 8.4–10.5)
Chloride: 99 meq/L (ref 96–112)
Creatinine, Ser: 1.16 mg/dL (ref 0.40–1.50)
GFR: 62.2 mL/min (ref >60.00)
Glucose, Bld: 361 mg/dL — ABNORMAL HIGH (ref 70–99)
Potassium: 4.9 meq/L (ref 3.5–5.1)
Sodium: 133 meq/L — ABNORMAL LOW (ref 135–145)
Total Bilirubin: 0.5 mg/dL (ref 0.2–1.2)
Total Protein: 7.3 g/dL (ref 6.0–8.3)

## 2024-03-20 LAB — POCT GLYCOSYLATED HEMOGLOBIN (HGB A1C): HbA1c POC (<> result, manual entry): 9.9 % (ref 4.0–5.6)

## 2024-03-20 LAB — HEMOGLOBIN A1C: Hgb A1c MFr Bld: 10.4 % — ABNORMAL HIGH (ref 4.6–6.5)

## 2024-03-20 MED ORDER — OLMESARTAN MEDOXOMIL 20 MG PO TABS: 20.0000 mg | ORAL_TABLET | Freq: Every day | ORAL | 3 refills | Status: DC

## 2024-03-20 MED ORDER — EMPAGLIFLOZIN 10 MG PO TABS: 10.0000 mg | ORAL_TABLET | Freq: Every day | ORAL | 3 refills | Status: DC

## 2024-03-20 MED ORDER — TRULICITY 1.5 MG/0.5ML ~~LOC~~ SOAJ: 1.5000 mg | SUBCUTANEOUS | 5 refills | Status: DC

## 2024-03-20 MED ORDER — ROSUVASTATIN CALCIUM 10 MG PO TABS: 10.0000 mg | ORAL_TABLET | Freq: Every day | ORAL | 3 refills | Status: DC

## 2024-03-20 NOTE — Assessment & Plan Note (Signed)
In remission.  Stable.  No problems.

## 2024-03-20 NOTE — Assessment & Plan Note (Addendum)
 BP Readings from Last 3 Encounters:  09/21/23 118/72  03/20/23 126/68  12/12/22 129/61  Well-controlled hypertension Continue olmesartan  20 mg daily Uncontrolled diabetes with hemoglobin A1c up to 9.9 Recommend to increase weekly Trulicity  to 1.5 mg and start Jardiance  or Farxiga as per insurance's formulary Diet and nutrition discussed Cardiovascular risks associated with uncontrolled diabetes discussed Follow-up in 3 months

## 2024-03-20 NOTE — Progress Notes (Signed)
 [name] 73 y.o.   No chief complaint on file.   HISTORY OF PRESENT ILLNESS: This is a 73 y.o. male A1A here for 84-month follow-up of chronic medical conditions including hypertension and diabetes Overall doing well.  Has no complaints or medical concerns today. Lab Results  Component Value Date   HGBA1C 7.7 (H) 09/21/2023   Wt Readings from Last 3 Encounters:  03/20/24 296 lb 8 oz (134.5 kg)  09/21/23 (!) 300 lb 3.2 oz (136.2 kg)  06/05/23 280 lb (127 kg)   BP Readings from Last 3 Encounters:  09/21/23 118/72  03/20/23 126/68  12/12/22 129/61      HPI   Prior to Admission medications   Medication Sig Start Date End Date Taking? Authorizing Provider  Dulaglutide  (TRULICITY ) 0.75 MG/0.5ML SOAJ Inject 0.75 mg into the skin once a week. 09/21/23  Yes Sherree Shankman, Isidro Margo, MD  ferrous sulfate  325 (65 FE) MG tablet Take 325 mg by mouth daily with breakfast.   Yes [provider]  ketoconazole  (NIZORAL ) 2 % cream Apply 1 Application topically daily. 09/21/23  Yes Kelsha Older, Isidro Margo, MD  Multiple Vitamin (MULTIVITAMIN) tablet Take 1 tablet by mouth daily.   Yes [provider]  olmesartan  (BENICAR ) 20 MG tablet Take 1 tablet (20 mg total) by mouth daily. 06/19/23  Yes Loic Hobin, Isidro Margo, MD  rosuvastatin  (CRESTOR ) 10 MG tablet Take 1 tablet (10 mg total) by mouth daily. 06/19/23  Yes Elvira Hammersmith, MD    Allergies  Allergen Reactions   No Known Allergies     Patient Active Problem List   Diagnosis Date Noted   Diabetic polyneuropathy associated with type 2 diabetes mellitus (HCC) 06/15/2021   Hyperlipidemia with target LDL less than 100 06/15/2021   Dyslipidemia associated with type 2 diabetes mellitus (HCC) 06/15/2021   Hypertension associated with diabetes (HCC) 06/15/2021   Abnormal electrocardiogram (ECG) (EKG) 06/15/2021   Counseling regarding advance care planning and goals of care 08/13/2018   Port catheter in place 08/03/2017    GERD (gastroesophageal reflux disease) 03/25/2017   Anemia 03/25/2017   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    Past Medical History:  Diagnosis Date   Anemia    Cancer (HCC) dx'd 11/2016   Gastric Adenocarcinoma   Gastric outlet obstruction 01/2017   GERD (gastroesophageal reflux disease)    Pre-diabetes    denies   SVT (supraventricular tachycardia) (HCC)    during Admission and Surgery- 01/2017    Past Surgical History:  Procedure Laterality Date   CHOLECYSTECTOMY N/A 01/10/2017   Procedure: CHOLECYSTECTOMY;  Surgeon: Lockie Rima, MD;  Location: [facility] OR;  Service: General;  Laterality: N/A;   COLONOSCOPY     COLONOSCOPY  10/11/2011   Procedure: COLONOSCOPY;  Surgeon: Suzette Espy, MD;  Location: [facility];  Service: Endoscopy;  Laterality: N/A;  10:30 AM   ESOPHAGOGASTRODUODENOSCOPY N/A 12/30/2016   Procedure: ESOPHAGOGASTRODUODENOSCOPY (EGD);  Surgeon: Alvis Jourdain, MD;  Location: [facility];  Service: Endoscopy;  Laterality: N/A;   GASTRECTOMY N/A 01/10/2017   Procedure: DISTAL GASTRECTOMY, JEJUNUM  FEEDING TUBE PLACEMENT;  Surgeon: Lockie Rima, MD;  Location: [facility] OR;  Service: General;  Laterality: N/A;   IR GENERIC HISTORICAL  01/24/2017   IR REPLC DUODEN/JEJUNO TUBE PERCUT W/FLUORO 01/24/2017 Erica Hau, MD [facility]   IR [facility] DUODEN/JEJUNO TUBE PERCUT W/FLUORO  03/26/2017   PORTACATH PLACEMENT N/A 02/23/2017   Procedure: INSERTION PORT-A-CATH;  Surgeon: Lockie Rima, MD;  Location: [facility] OR;  Service:  General;  Laterality: N/A;   testicle removed  1982    Social History   Socioeconomic History   Marital status: Married    Spouse name: Not on file   Number of children: Not on file   Years of education: Not on file   Highest education level: Not on file  Occupational History   Not on file  Tobacco Use   Smoking status: Never   Smokeless tobacco: Never  Vaping Use   Vaping status: Unknown  Substance and Sexual Activity   Alcohol use:  No    Comment: occ beer or wine but none in last 6 months    Drug use: No   Sexual activity: Not on file  Other Topics Concern   Not on file  Social History Narrative   Not on file   Social Drivers of Health   Financial Resource Strain: Low Risk  (06/05/2023)   Overall Financial Resource Strain (CARDIA)    Difficulty of Paying Living Expenses: Not hard at all  Food Insecurity: No Food Insecurity (06/05/2023)   Hunger Vital Sign    Worried About Running Out of Food in the Last Year: Never true    Ran Out of Food in the Last Year: Never true  Transportation Needs: No Transportation Needs (06/05/2023)   PRAPARE - Administrator, Civil Service (Medical): No    Lack of Transportation (Non-Medical): No  Physical Activity: Sufficiently Active (06/05/2023)   Exercise Vital Sign    Days of Exercise per Week: 7 days    Minutes of Exercise per Session: 30 min  Stress: No Stress Concern Present (06/05/2023)   [employer] of Occupational Health - Occupational Stress Questionnaire    Feeling of Stress : Not at all  Social Connections: Socially Integrated (06/05/2023)   Social Connection and Isolation Panel [NHANES]    Frequency of Communication with Friends and Family: More than three times a week    Frequency of Social Gatherings with Friends and Family: More than three times a week    Attends Religious Services: More than 4 times per year    Active Member of [employer] or Organizations: Yes    Attends Engineer, structural: More than 4 times per year    Marital Status: Married  Catering manager Violence: Not At Risk (06/05/2023)   Humiliation, Afraid, Rape, and Kick questionnaire    Fear of Current or Ex-Partner: No    Emotionally Abused: No    Physically Abused: No    Sexually Abused: No    Family History  Problem Relation Age of Onset   Prostate cancer Father    Colon cancer Neg Hx      Review of Systems  Constitutional: Negative.  Negative for chills and  fever.  HENT: Negative.  Negative for congestion and sore throat.   Respiratory: Negative.  Negative for cough and shortness of breath.   Cardiovascular: Negative.  Negative for chest pain and palpitations.  Gastrointestinal:  Negative for abdominal pain, diarrhea, nausea and vomiting.  Genitourinary: Negative.  Negative for dysuria and hematuria.  Skin: Negative.  Negative for rash.  Neurological:  Negative for dizziness and headaches.  All other systems reviewed and are negative.   Today's Vitals   03/20/24 1346  Pulse: 76  Temp: 98.1 F (36.7 C)  TempSrc: Temporal  SpO2: 98%  Weight: 296 lb 8 oz (134.5 kg)  Height: 5\' 11"  (1.803 m)   Body mass index is 41.35 kg/m.   Physical Exam  Vitals reviewed.  Constitutional:      Appearance: Normal appearance.  HENT:     Head: Normocephalic.  Eyes:     Extraocular Movements: Extraocular movements intact.  Cardiovascular:     Rate and Rhythm: Normal rate and regular rhythm.  Pulmonary:     Effort: Pulmonary effort is normal.     Breath sounds: Normal breath sounds.  Musculoskeletal:     Cervical back: No tenderness.  Lymphadenopathy:     Cervical: No cervical adenopathy.  Skin:    General: Skin is warm and dry.  Neurological:     Mental Status: He is alert and oriented to person, place, and time.  Psychiatric:        Mood and Affect: Mood normal.        Behavior: Behavior normal.    Results for orders placed or performed in visit on 03/20/24 (from the past 24 hours)  POCT HgB A1C     Status: None   Collection Time: 03/20/24  2:22 PM  Result Value Ref Range   Hemoglobin A1C     HbA1c POC (<> result, manual entry) 9.9 4.0 - 5.6 %   HbA1c, POC (prediabetic range)     HbA1c, POC (controlled diabetic range)       ASSESSMENT & PLAN: A total of 44 minutes was spent with the patient and counseling/coordination of care regarding preparing for this visit, review of most recent office visit notes, review of multiple chronic  medical conditions and their management, cardiovascular risk associated with uncontrolled diabetes, review of all medications and changes made, review of most recent bloodwork results and interpretation of today's hemoglobin A1c, review of health maintenance items, education on nutrition, prognosis, documentation, and need for follow up.   Problem List Items Addressed This Visit       Cardiovascular and Mediastinum   Hypertension associated with diabetes (HCC) - Primary   BP Readings from Last 3 Encounters:  09/21/23 118/72  03/20/23 126/68  12/12/22 129/61  Well-controlled hypertension Continue olmesartan  20 mg daily Uncontrolled diabetes with hemoglobin A1c up to 9.9 Recommend to increase weekly Trulicity  to 1.5 mg and start Jardiance  or Farxiga as per insurance's formulary Diet and nutrition discussed Cardiovascular risks associated with uncontrolled diabetes discussed Follow-up in 3 months       Relevant Medications   rosuvastatin  (CRESTOR ) 10 MG tablet   olmesartan  (BENICAR ) 20 MG tablet   Dulaglutide  (TRULICITY ) 1.5 MG/0.5ML SOAJ   empagliflozin  (JARDIANCE ) 10 MG TABS tablet   Other Relevant Orders   POCT HgB A1C (Completed)   Comprehensive metabolic panel with GFR   Hemoglobin A1c   Lipid panel     Digestive   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 Stable. No problems       GERD (gastroesophageal reflux disease)     Endocrine   Diabetic polyneuropathy associated with type 2 diabetes mellitus (HCC)   Stable and well-controlled.  Pretty asymptomatic.      Relevant Medications   rosuvastatin  (CRESTOR ) 10 MG tablet   olmesartan  (BENICAR ) 20 MG tablet   Dulaglutide  (TRULICITY ) 1.5 MG/0.5ML SOAJ   empagliflozin  (JARDIANCE ) 10 MG TABS tablet   Dyslipidemia associated with type 2 diabetes mellitus (HCC)   Chronic stable conditions Continue weekly Trulicity  and rosuvastatin  10 mg daily Recommend to start Jardiance  10 mg daily Diet and  nutrition discussed Follow-up in 6 months      Relevant Medications   rosuvastatin  (CRESTOR ) 10 MG tablet   olmesartan  (BENICAR )  20 MG tablet   Dulaglutide  (TRULICITY ) 1.5 MG/0.5ML SOAJ   empagliflozin  (JARDIANCE ) 10 MG TABS tablet   Other Relevant Orders   POCT HgB A1C (Completed)   Comprehensive metabolic panel with GFR   Hemoglobin A1c   Lipid panel     Other   Hyperlipidemia with target LDL less than 100   Relevant Medications   rosuvastatin  (CRESTOR ) 10 MG tablet   olmesartan  (BENICAR ) 20 MG tablet   Other Visit Diagnoses       Type II diabetes mellitus with manifestations (HCC)       Relevant Medications   rosuvastatin  (CRESTOR ) 10 MG tablet   olmesartan  (BENICAR ) 20 MG tablet   Dulaglutide  (TRULICITY ) 1.5 MG/0.5ML SOAJ   empagliflozin  (JARDIANCE ) 10 MG TABS tablet     Primary hypertension       Relevant Medications   rosuvastatin  (CRESTOR ) 10 MG tablet   olmesartan  (BENICAR ) 20 MG tablet      Patient Instructions  Diabetes Mellitus and Nutrition, Adult When you have diabetes, or diabetes mellitus, it is very important to have healthy eating habits because your blood sugar (glucose) levels are greatly affected by what you eat and drink. Eating healthy foods in the right amounts, at about the same times every day, can help you: Manage your blood glucose. Lower your risk of heart disease. Improve your blood pressure. Reach or maintain a healthy weight. What can affect my meal plan? Every person with diabetes is different, and each person has different needs for a meal plan. Your health care provider may recommend that you work with a dietitian to make a meal plan that is best for you. Your meal plan may vary depending on factors such as: The calories you need. The medicines you take. Your weight. Your blood glucose, blood pressure, and cholesterol levels. Your activity level. Other health conditions you have, such as heart or kidney disease. How do carbohydrates  affect me? Carbohydrates, also called carbs, affect your blood glucose level more than any other type of food. Eating carbs raises the amount of glucose in your blood. It is important to know how many carbs you can safely have in each meal. This is different for every person. Your dietitian can help you calculate how many carbs you should have at each meal and for each snack. How does alcohol affect me? Alcohol can cause a decrease in blood glucose (hypoglycemia), especially if you use insulin  or take certain diabetes medicines by mouth. Hypoglycemia can be a life-threatening condition. Symptoms of hypoglycemia, such as sleepiness, dizziness, and confusion, are similar to symptoms of having too much alcohol. Do not drink alcohol if: Your health care provider tells you not to drink. You are pregnant, may be pregnant, or are planning to become pregnant. If you drink alcohol: Limit how much you have to: 0-1 drink a day for women. 0-2 drinks a day for men. Know how much alcohol is in your drink. In the [place] one drink equals one 12 oz bottle of beer (355 mL), one 5 oz glass of wine (148 mL), or one 1 oz glass of hard liquor (44 mL). Keep yourself hydrated with water , diet soda, or unsweetened iced tea. Keep in mind that regular soda, juice, and other mixers may contain a lot of sugar and must be counted as carbs. What are tips for following this plan?  Reading food labels Start by checking the serving size on the Nutrition Facts label of packaged foods and drinks. The number of calories  and the amount of carbs, fats, and other nutrients listed on the label are based on one serving of the item. Many items contain more than one serving per package. Check the total grams (g) of carbs in one serving. Check the number of grams of saturated fats and trans fats in one serving. Choose foods that have a low amount or none of these fats. Check the number of milligrams (mg) of salt (sodium) in one serving.  Most people should limit total sodium intake to less than 2,300 mg per day. Always check the nutrition information of foods labeled as "low-fat" or "nonfat." These foods may be higher in added sugar or refined carbs and should be avoided. Talk to your dietitian to identify your daily goals for nutrients listed on the label. Shopping Avoid buying canned, pre-made, or processed foods. These foods tend to be high in fat, sodium, and added sugar. Shop around the outside edge of the grocery store. This is where you will most often find fresh fruits and vegetables, bulk grains, fresh meats, and fresh dairy products. Cooking Use low-heat cooking methods, such as baking, instead of high-heat cooking methods, such as deep frying. Cook using healthy oils, such as olive, canola, or sunflower oil. Avoid cooking with butter, cream, or high-fat meats. Meal planning Eat meals and snacks regularly, preferably at the same times every day. Avoid going long periods of time without eating. Eat foods that are high in fiber, such as fresh fruits, vegetables, beans, and whole grains. Eat 4-6 oz (112-168 g) of lean protein each day, such as lean meat, chicken, fish, eggs, or tofu. One ounce (oz) (28 g) of lean protein is equal to: 1 oz (28 g) of meat, chicken, or fish. 1 egg.  cup (62 g) of tofu. Eat some foods each day that contain healthy fats, such as avocado, nuts, seeds, and fish. What foods should I eat? Fruits Berries. Apples. Oranges. Peaches. Apricots. Plums. Grapes. Mangoes. Papayas. Pomegranates. Kiwi. Cherries. Vegetables Leafy greens, including lettuce, spinach, kale, chard, collard greens, mustard greens, and cabbage. Beets. Cauliflower. Broccoli. Carrots. Green beans. Tomatoes. Peppers. Onions. Cucumbers. Brussels sprouts. Grains Whole grains, such as whole-wheat or whole-grain bread, crackers, tortillas, cereal, and pasta. Unsweetened oatmeal. Quinoa. Brown or wild rice. Meats and other  proteins Seafood. Poultry without skin. Lean cuts of poultry and beef. Tofu. Nuts. Seeds. Dairy Low-fat or fat-free dairy products such as milk, yogurt, and cheese. The items listed above may not be a complete list of foods and beverages you can eat and drink. Contact a dietitian for more information. What foods should I avoid? Fruits Fruits canned with syrup. Vegetables Canned vegetables. Frozen vegetables with butter or cream sauce. Grains Refined white flour and flour products such as bread, pasta, snack foods, and cereals. Avoid all processed foods. Meats and other proteins Fatty cuts of meat. Poultry with skin. Breaded or fried meats. Processed meat. Avoid saturated fats. Dairy Full-fat yogurt, cheese, or milk. Beverages Sweetened drinks, such as soda or iced tea. The items listed above may not be a complete list of foods and beverages you should avoid. Contact a dietitian for more information. Questions to ask a health care provider Do I need to meet with a certified diabetes care and education specialist? Do I need to meet with a dietitian? What number can I call if I have questions? When are the best times to check my blood glucose? Where to find more information: American Diabetes Association: diabetes.org Academy of Nutrition and Dietetics: eatright.org  [employer] of Diabetes and Digestive and Kidney Diseases: StageSync.si Association of Diabetes Care & Education Specialists: diabeteseducator.org Summary It is important to have healthy eating habits because your blood sugar (glucose) levels are greatly affected by what you eat and drink. It is important to use alcohol carefully. A healthy meal plan will help you manage your blood glucose and lower your risk of heart disease. Your health care provider may recommend that you work with a dietitian to make a meal plan that is best for you. This information is not intended to replace advice given to you by your health  care provider. Make sure you discuss any questions you have with your health care provider. Document Revised: 06/02/2020 Document Reviewed: 06/03/2020 Elsevier Patient Education  2024 Elsevier Inc.    Maryagnes Small, MD [facility]

## 2024-03-20 NOTE — Assessment & Plan Note (Signed)
 Stable and well-controlled.  Pretty asymptomatic.

## 2024-03-20 NOTE — Assessment & Plan Note (Addendum)
 Chronic stable conditions Continue weekly Trulicity  and rosuvastatin  10 mg daily Recommend to start Jardiance  10 mg daily Diet and nutrition discussed Follow-up in 6 months

## 2024-03-20 NOTE — Patient Instructions (Signed)

## 2024-03-21 ENCOUNTER — Encounter: Payer: Self-pay | Admitting: Emergency Medicine

## 2024-06-05 ENCOUNTER — Ambulatory Visit (INDEPENDENT_AMBULATORY_CARE_PROVIDER_SITE_OTHER): Payer: Medicare Other

## 2024-06-05 VITALS — Ht 71.0 in | Wt 296.0 lb

## 2024-06-05 DIAGNOSIS — Z0000 Encounter for general adult medical examination without abnormal findings: Secondary | ICD-10-CM | POA: Diagnosis not present

## 2024-06-05 DIAGNOSIS — E118 Type 2 diabetes mellitus with unspecified complications: Secondary | ICD-10-CM

## 2024-06-05 NOTE — Patient Instructions (Signed)
 [name] , Thank you for taking time out of your busy schedule to complete your Annual Wellness Visit with me. I enjoyed our conversation and look forward to speaking with you again next year. I, as well as your care team,  appreciate your ongoing commitment to your health goals. Please review the following plan we discussed and let me know if I can assist you in the future. Your Game plan/ To Do List   Follow up Visits: Next Medicare AWV with our clinical staff: 06/06/2025.   Have you seen your provider in the last 6 months (3 months if uncontrolled diabetes)? Yes Next Office Visit with your provider: 06/25/2024.  Clinician Recommendations:  Aim for 30 minutes of exercise or brisk walking, 6-8 glasses of water , and 5 servings of fruits and vegetables each day. You are due for a colonoscopy and a diabetic eye exam.  Please have records sent to Dr. Sagarida when finished.  You are also due for a foot exam and a Hep c screening, which can be done during your next office visit here.      This is a list of the screening recommended for you and due dates:  Health Maintenance  Topic Date Due   Eye exam for diabetics  Never done   Yearly kidney health urinalysis for diabetes  Never done   Hepatitis C Screening  Never done   DTaP/Tdap/Td vaccine (1 - Tdap) Never done   Zoster (Shingles) Vaccine (1 of 2) Never done   Colon Cancer Screening  10/10/2021   Complete foot exam   06/15/2022   COVID-19 Vaccine (6 - 2024-25 season) 07/16/2023   Medicare Annual Wellness Visit  06/04/2024   Flu Shot  06/14/2024   Hemoglobin A1C  09/20/2024   Yearly kidney function blood test for diabetes  03/20/2025   Pneumococcal Vaccine for age over 41  Completed   Hepatitis B Vaccine  Aged Out   HPV Vaccine  Aged Out   Meningitis B Vaccine  Aged Out    Advanced directives: (Copy Requested) Please bring a copy of your health care power of attorney and living will to the office to be added to your chart at your  convenience. You can mail to [facility] [address] [place], [place] [address] or email to [email or web address] Advance Care Planning is important because it:  [x]  Makes sure you receive the medical care that is consistent with your values, goals, and preferences  [x]  It provides guidance to your family and loved ones and reduces their decisional burden about whether or not they are making the right decisions based on your wishes.  Follow the link provided in your after visit summary or read over the paperwork we have mailed to you to help you started getting your Advance Directives in place. If you need assistance in completing these, please reach out to us  so that we can help you!  See attachments for Preventive Care and Fall Prevention Tips.

## 2024-06-05 NOTE — Progress Notes (Signed)
 Subjective:   [name] is a 73 y.o. who presents for a Medicare Wellness preventive visit.  As a reminder, Annual Wellness Visits don't include a physical exam, and some assessments may be limited, especially if this visit is performed virtually. We may recommend an in-person follow-up visit with your provider if needed.  Visit Complete: Virtual I connected with  [name] on 06/05/24 by a audio enabled telemedicine application and verified that I am speaking with the correct person using two identifiers.  Patient Location: Home  Provider Location: Home Office  I discussed the limitations of evaluation and management by telemedicine. The patient expressed understanding and agreed to proceed.  Vital Signs: Because this visit was a virtual/telehealth visit, some criteria may be missing or patient reported. Any vitals not documented were not able to be obtained and vitals that have been documented are patient reported.  VideoDeclined- This patient declined Librarian, academic. Therefore the visit was completed with audio only.  Persons Participating in Visit: Patient.  AWV Questionnaire: No: Patient Medicare AWV questionnaire was not completed prior to this visit.  Cardiac Risk Factors include: advanced age (>57men, >65 women);diabetes mellitus;hypertension;male gender;dyslipidemia     Objective:    Today's Vitals   06/05/24 1254  Weight: 296 lb (134.3 kg)  Height: 5' 11 (1.803 m)   Body mass index is 41.28 kg/m.     06/05/2024    1:08 PM 06/05/2023    1:33 PM 10/13/2020   11:17 AM 10/09/2017    2:27 PM 09/27/2017    3:17 PM 08/25/2017   11:23 AM 08/16/2017    1:51 PM  Advanced Directives  Does Patient Have a Medical Advance Directive? Yes No No No  No  No  No   Type of Estate agent of [place];Living will        Copy of Healthcare Power of Attorney in Chart? No - copy requested        Would patient like  information on creating a medical advance directive?  No - Patient declined No - Patient declined  No - Patient declined  No - Patient declined       Data saved with a previous flowsheet row definition    Current Medications (verified) Outpatient Encounter Medications as of 06/05/2024  Medication Sig   Dulaglutide  (TRULICITY ) 1.5 MG/0.5ML SOAJ Inject 1.5 mg into the skin once a week.   empagliflozin  (JARDIANCE ) 10 MG TABS tablet Take 1 tablet (10 mg total) by mouth daily before breakfast.   ferrous sulfate  325 (65 FE) MG tablet Take 325 mg by mouth daily with breakfast.   ketoconazole  (NIZORAL ) 2 % cream Apply 1 Application topically daily.   Multiple Vitamin (MULTIVITAMIN) tablet Take 1 tablet by mouth daily.   olmesartan  (BENICAR ) 20 MG tablet Take 1 tablet (20 mg total) by mouth daily.   rosuvastatin  (CRESTOR ) 10 MG tablet Take 1 tablet (10 mg total) by mouth daily.   No facility-administered encounter medications on file as of 06/05/2024.    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   Gastric outlet obstruction 01/2017   GERD (gastroesophageal reflux disease)    Pre-diabetes    denies   SVT (supraventricular tachycardia) (HCC)    during Admission and Surgery- 01/2017   Past Surgical History:  Procedure Laterality Date   CHOLECYSTECTOMY N/A 01/10/2017   Procedure: CHOLECYSTECTOMY;  Surgeon: Jina Nephew, MD;  Location: [facility] OR;  Service: General;  Laterality: N/A;   COLONOSCOPY     COLONOSCOPY  10/11/2011   Procedure: COLONOSCOPY;  Surgeon: [name] CHRISTELLA Hollingshead, MD;  Location: [facility];  Service: Endoscopy;  Laterality: N/A;  10:30 AM   ESOPHAGOGASTRODUODENOSCOPY N/A 12/30/2016   Procedure: ESOPHAGOGASTRODUODENOSCOPY (EGD);  Surgeon: Belvie Just, MD;  Location: [facility];  Service: Endoscopy;  Laterality: N/A;   GASTRECTOMY N/A 01/10/2017   Procedure: DISTAL  GASTRECTOMY, JEJUNUM  FEEDING TUBE PLACEMENT;  Surgeon: Jina Nephew, MD;  Location: [facility] OR;  Service: General;  Laterality: N/A;   IR GENERIC HISTORICAL  01/24/2017   IR REPLC DUODEN/JEJUNO TUBE PERCUT W/FLUORO 01/24/2017 Marcey Moan, MD [facility]   IR [facility] DUODEN/JEJUNO TUBE PERCUT W/FLUORO  03/26/2017   PORTACATH PLACEMENT N/A 02/23/2017   Procedure: INSERTION PORT-A-CATH;  Surgeon: Jina Nephew, MD;  Location: [facility] OR;  Service: General;  Laterality: N/A;   testicle removed  1982   Family History  Problem Relation Age of Onset   Prostate cancer Father    Colon cancer Neg Hx    Social History   Socioeconomic History   Marital status: Married    Spouse name: Arland   Number of children: Not on file   Years of education: Not on file   Highest education level: Not on file  Occupational History   Not on file  Tobacco Use   Smoking status: Never   Smokeless tobacco: Never  Vaping Use   Vaping status: Unknown  Substance and Sexual Activity   Alcohol use: No    Comment: occ beer or wine but none in last 6 months    Drug use: No   Sexual activity: Not on file  Other Topics Concern   Not on file  Social History Narrative   Lives with wife-2025   Social Drivers of Health   Financial Resource Strain: Low Risk  (06/05/2024)   Overall Financial Resource Strain (CARDIA)    Difficulty of Paying Living Expenses: Not hard at all  Food Insecurity: No Food Insecurity (06/05/2024)   Hunger Vital Sign    Worried About Running Out of Food in the Last Year: Never true    Ran Out of Food in the Last Year: Never true  Transportation Needs: No Transportation Needs (06/05/2024)   PRAPARE - Administrator, Civil Service (Medical): No    Lack of Transportation (Non-Medical): No  Physical Activity: Sufficiently Active (06/05/2023)   Exercise Vital Sign    Days of Exercise per Week: 7 days    Minutes of Exercise per Session: 30 min  Stress: No Stress Concern Present (06/05/2024)    [employer] of Occupational Health - Occupational Stress Questionnaire    Feeling of Stress: Not at all  Social Connections: Moderately Isolated (06/05/2024)   Social Connection and Isolation Panel    Frequency of Communication with Friends and Family: More than three times a week    Frequency of Social Gatherings with Friends and Family: More than three times a week    Attends Religious Services: Never    Database administrator or Organizations: No    Attends Engineer, structural: Never    Marital Status: Married    Tobacco Counseling Counseling given: Not Answered    Clinical Intake:  Pre-visit preparation completed: Yes  Pain : No/denies pain     BMI - recorded: 41.28 Nutritional Status: BMI > 30  Obese Nutritional Risks: None Diabetes: Yes  CBG done?: No Did pt. bring in CBG monitor from home?: No  Lab Results  Component Value Date   HGBA1C 10.4 (H) 03/20/2024   HGBA1C 9.9 03/20/2024   HGBA1C 7.7 (H) 09/21/2023     How often do you need to have someone help you when you read instructions, pamphlets, or other written materials from your doctor or pharmacy?: 1 - Never  Interpreter Needed?: No  Information entered by :: Carie Kapuscinski, RMA   Activities of Daily Living     06/05/2024   12:55 PM  In your present state of health, do you have any difficulty performing the following activities:  Hearing? 0  Vision? 0  Difficulty concentrating or making decisions? 0  Walking or climbing stairs? 0  Dressing or bathing? 0  Doing errands, shopping? 0  Preparing Food and eating ? N  Using the Toilet? N  In the past six months, have you accidently leaked urine? N  Do you have problems with loss of bowel control? N  Managing your Medications? N  Managing your Finances? N  Housekeeping or managing your Housekeeping? N    Patient Care Team: Purcell Emil Schanz, MD as PCP - General (Internal Medicine)  I have updated your Care Teams any recent  Medical Services you may have received from other providers in the past year.     Assessment:   This is a routine wellness examination for [name].  Hearing/Vision screen Hearing Screening - Comments:: Denies hearing difficulties   Vision Screening - Comments:: Wears eyeglasses/ [employer] in [place]   Goals Addressed             This Visit's Progress    My healthcare goal for 2024 is to maintain my health by staying healthy.   On track      Depression Screen     06/05/2024    1:11 PM 03/20/2024    1:57 PM 09/21/2023    1:43 PM 06/05/2023    1:35 PM 03/20/2023    1:52 PM 09/06/2022    2:23 PM 06/06/2022    2:29 PM  PHQ 2/9 Scores  PHQ - 2 Score 0 0 0 0 0 0 0  PHQ- 9 Score 2   0       Fall Risk     06/05/2024    1:08 PM 03/20/2024    1:57 PM 09/21/2023    1:43 PM 06/05/2023    1:34 PM 03/20/2023    1:52 PM  Fall Risk   Falls in the past year? 0 0 0 0 0  Number falls in past yr: 0 0 0 0 0  Injury with Fall? 0 0 0 0 0  Risk for fall due to :  No Fall Risks No Fall Risks No Fall Risks No Fall Risks  Follow up Falls evaluation completed;Falls prevention discussed Falls evaluation completed Falls evaluation completed Falls prevention discussed Falls evaluation completed    MEDICARE RISK AT HOME:  Medicare Risk at Home Any stairs in or around the home?: Yes (outside) If so, are there any without handrails?: No Home free of loose throw rugs in walkways, pet beds, electrical cords, etc?: Yes Adequate lighting in your home to reduce risk of falls?: Yes Life alert?: No Use of a cane, walker or w/c?: No Grab bars in the bathroom?: Yes Shower chair or bench in shower?: Yes Elevated toilet seat or a handicapped toilet?: Yes  TIMED UP AND GO:  Was the test performed?  No  Cognitive  Function: Declined/Normal: No cognitive concerns noted by patient or family. Patient alert, oriented, able to answer questions appropriately and recall recent events. No signs of memory loss or  confusion.        06/05/2023    1:37 PM  6CIT Screen  What Year? 0 points  What month? 0 points  What time? 0 points  Count back from 20 0 points  Months in reverse 0 points  Repeat phrase 0 points  Total Score 0 points    Immunizations Immunization History  Administered Date(s) Administered   Fluad Quad(high Dose 65+) 10/13/2020, 09/06/2022   Influenza, High Dose Seasonal PF 09/05/2018   Influenza,inj,Quad PF,6+ Mos 11/17/2017   Influenza-Unspecified 10/01/2021   Moderna Covid-19 Fall Seasonal Vaccine 53yrs & older 09/06/2022   Moderna SARS-COV2 Booster Vaccination 10/01/2021   Moderna Sars-Covid-2 Vaccination 12/10/2019, 01/07/2020, 10/01/2020, 03/04/2021   Pneumococcal Conjugate-13 01/16/2018   Pneumococcal Polysaccharide-23 01/14/2017   RSV,unspecified 11/01/2022   Zoster Recombinant(Shingrix ) 11/20/2021, 02/10/2022    Screening Tests Health Maintenance  Topic Date Due   OPHTHALMOLOGY EXAM  Never done   Diabetic kidney evaluation - Urine ACR  Never done   Hepatitis C Screening  Never done   DTaP/Tdap/Td (1 - Tdap) Never done   Colonoscopy  10/10/2021   FOOT EXAM  06/15/2022   COVID-19 Vaccine (6 - 2024-25 season) 07/16/2023   Medicare Annual Wellness (AWV)  06/04/2024   INFLUENZA VACCINE  06/14/2024   HEMOGLOBIN A1C  09/20/2024   Diabetic kidney evaluation - eGFR measurement  03/20/2025   Pneumococcal Vaccine: 50+ Years  Completed   Zoster Vaccines- Shingrix   Completed   Hepatitis B Vaccines  Aged Out   HPV VACCINES  Aged Out   Meningococcal B Vaccine  Aged Out    Health Maintenance  Health Maintenance Due  Topic Date Due   OPHTHALMOLOGY EXAM  Never done   Diabetic kidney evaluation - Urine ACR  Never done   Hepatitis C Screening  Never done   DTaP/Tdap/Td (1 - Tdap) Never done   Colonoscopy  10/10/2021   FOOT EXAM  06/15/2022   COVID-19 Vaccine (6 - 2024-25 season) 07/16/2023   Medicare Annual Wellness (AWV)  06/04/2024   Health Maintenance Items  Addressed: Diabetic Foot Exam recommended, Labs Ordered: Hep C and UACR, See Nurse Notes at the end of this note  Additional Screening:  Vision Screening: Recommended annual ophthalmology exams for early detection of glaucoma and other disorders of the eye. Would you like a referral to an eye doctor? No    Dental Screening: Recommended annual dental exams for proper oral hygiene  Community Resource Referral / Chronic Care Management: CRR required this visit?  No   CCM required this visit?  No   Plan:    I have personally reviewed and noted the following in the patient's chart:   Medical and social history Use of alcohol, tobacco or illicit drugs  Current medications and supplements including opioid prescriptions. Patient is not currently taking opioid prescriptions. Functional ability and status Nutritional status Physical activity Advanced directives List of other physicians Hospitalizations, surgeries, and ER visits in previous 12 months Vitals Screenings to include cognitive, depression, and falls Referrals and appointments  In addition, I have reviewed and discussed with patient certain preventive protocols, quality metrics, and best practice recommendations. A written personalized care plan for preventive services as well as general preventive health recommendations were provided to patient.   Trinna CROME Latoy Labriola, CMA   06/05/2024   After Visit Summary: (MyChart)  Due to this being a telephonic visit, the after visit summary with patients personalized plan was offered to patient via MyChart   Notes: Patient is due for a diabetic eye exam and stated that he will schedule soon.  Patient is also due for a foot exam, a UACR and a Hep C screening, which can be done during his next office visit with Dr. Purcell.  He stated that he has had a Tdap, however it is not documented in the [place]  Patient is also due for a colonoscopy, which he stated that he would like to set that up with  his own GI provider. Patient scheduled a 3 month follow up for another A1C check as the last one was 03/20/24.  He had no other concerns to address today.

## 2024-06-21 ENCOUNTER — Telehealth: Payer: Self-pay | Admitting: Hematology

## 2024-06-25 ENCOUNTER — Encounter: Payer: Self-pay | Admitting: Emergency Medicine

## 2024-06-25 ENCOUNTER — Inpatient Hospital Stay: Attending: Hematology

## 2024-06-25 ENCOUNTER — Ambulatory Visit: Admitting: Emergency Medicine

## 2024-06-25 VITALS — BP 122/72 | HR 64 | Temp 98.1°F | Ht 71.0 in | Wt 291.0 lb

## 2024-06-25 DIAGNOSIS — E1159 Type 2 diabetes mellitus with other circulatory complications: Secondary | ICD-10-CM | POA: Diagnosis not present

## 2024-06-25 DIAGNOSIS — E118 Type 2 diabetes mellitus with unspecified complications: Secondary | ICD-10-CM | POA: Diagnosis not present

## 2024-06-25 DIAGNOSIS — Z7984 Long term (current) use of oral hypoglycemic drugs: Secondary | ICD-10-CM

## 2024-06-25 DIAGNOSIS — E1169 Type 2 diabetes mellitus with other specified complication: Secondary | ICD-10-CM

## 2024-06-25 DIAGNOSIS — Z1211 Encounter for screening for malignant neoplasm of colon: Secondary | ICD-10-CM | POA: Diagnosis not present

## 2024-06-25 DIAGNOSIS — C160 Malignant neoplasm of cardia: Secondary | ICD-10-CM

## 2024-06-25 DIAGNOSIS — Z95828 Presence of other vascular implants and grafts: Secondary | ICD-10-CM

## 2024-06-25 DIAGNOSIS — I152 Hypertension secondary to endocrine disorders: Secondary | ICD-10-CM | POA: Diagnosis not present

## 2024-06-25 DIAGNOSIS — Z6841 Body Mass Index (BMI) 40.0 and over, adult: Secondary | ICD-10-CM | POA: Diagnosis not present

## 2024-06-25 DIAGNOSIS — Z7189 Other specified counseling: Secondary | ICD-10-CM

## 2024-06-25 DIAGNOSIS — E1142 Type 2 diabetes mellitus with diabetic polyneuropathy: Secondary | ICD-10-CM | POA: Diagnosis not present

## 2024-06-25 DIAGNOSIS — E785 Hyperlipidemia, unspecified: Secondary | ICD-10-CM | POA: Diagnosis not present

## 2024-06-25 DIAGNOSIS — Z85028 Personal history of other malignant neoplasm of stomach: Secondary | ICD-10-CM | POA: Insufficient documentation

## 2024-06-25 DIAGNOSIS — Z08 Encounter for follow-up examination after completed treatment for malignant neoplasm: Secondary | ICD-10-CM | POA: Insufficient documentation

## 2024-06-25 LAB — POCT GLYCOSYLATED HEMOGLOBIN (HGB A1C): Hemoglobin A1C: 12.1 % — AB (ref 4.0–5.6)

## 2024-06-25 MED ORDER — GLIPIZIDE 5 MG PO TABS: 5.0000 mg | ORAL_TABLET | Freq: Two times a day (BID) | ORAL | 3 refills | Status: AC

## 2024-06-25 MED ORDER — SODIUM CHLORIDE 0.9% FLUSH
10.0000 mL | INTRAVENOUS | Status: DC | PRN
  Administered 2024-06-25 (×2): 10 mL via INTRAVENOUS

## 2024-06-25 NOTE — Assessment & Plan Note (Signed)
 Stable and well-controlled. Pretty asymptomatic.

## 2024-06-25 NOTE — Assessment & Plan Note (Signed)
 BP Readings from Last 3 Encounters:  06/25/24 122/72  09/21/23 118/72  03/20/23 126/68  Well-controlled hypertension Continue olmesartan  20 mg daily Uncontrolled diabetes with hemoglobin A1c higher than before at 12.1 Did not start Jardiance  as recommended last time Continues Trulicity  1.5 mg weekly Recommend to start glipizide  5 mg twice a day Cardiovascular risks associated with uncontrolled diabetes discussed Diet and nutrition discussed Follow-up in 3 months

## 2024-06-25 NOTE — Patient Instructions (Signed)

## 2024-06-25 NOTE — Progress Notes (Signed)
 [name] 73 y.o.   Chief Complaint  Patient presents with   Follow-up    Patient here for 3 month f/y for HTN/ DM. No other concerns     HISTORY OF PRESENT ILLNESS: This is a 73 y.o. male A1A here for 54-month follow-up of multiple chronic medical conditions including diabetes and hypertension. Only taking Trulicity  for diabetes.  Not taking Jardiance . Overall doing well.  Has no complaints or medical concerns today. Lab Results  Component Value Date   HGBA1C 10.4 (H) 03/20/2024   BP Readings from Last 3 Encounters:  09/21/23 118/72  03/20/23 126/68  12/12/22 129/61   Wt Readings from Last 3 Encounters:  06/05/24 296 lb (134.3 kg)  03/20/24 296 lb 8 oz (134.5 kg)  09/21/23 (!) 300 lb 3.2 oz (136.2 kg)     HPI   Prior to Admission medications   Medication Sig Start Date End Date Taking? Authorizing Provider  Dulaglutide  (TRULICITY ) 1.5 MG/0.5ML SOAJ Inject 1.5 mg into the skin once a week. 03/20/24  Yes Breelyn Icard, Emil Schanz, MD  empagliflozin  (JARDIANCE ) 10 MG TABS tablet Take 1 tablet (10 mg total) by mouth daily before breakfast. 03/20/24  Yes Shagun Wordell, Emil Schanz, MD  ferrous sulfate  325 (65 FE) MG tablet Take 325 mg by mouth daily with breakfast.   Yes [provider]  ketoconazole  (NIZORAL ) 2 % cream Apply 1 Application topically daily. 09/21/23  Yes Tuvia Woodrick, Emil Schanz, MD  Multiple Vitamin (MULTIVITAMIN) tablet Take 1 tablet by mouth daily.   Yes [provider]  olmesartan  (BENICAR ) 20 MG tablet Take 1 tablet (20 mg total) by mouth daily. 03/20/24  Yes Alasia Enge, Emil Schanz, MD  rosuvastatin  (CRESTOR ) 10 MG tablet Take 1 tablet (10 mg total) by mouth daily. 03/20/24  Yes Purcell Emil Schanz, MD    Allergies  Allergen Reactions   No Known Allergies     Patient Active Problem List   Diagnosis Date Noted   Body mass index 40.0-44.9, adult (HCC) 06/25/2024   Diabetic polyneuropathy associated with type 2 diabetes mellitus (HCC) 06/15/2021    Hyperlipidemia with target LDL less than 100 06/15/2021   Dyslipidemia associated with type 2 diabetes mellitus (HCC) 06/15/2021   Hypertension associated with diabetes (HCC) 06/15/2021   Abnormal electrocardiogram (ECG) (EKG) 06/15/2021   Counseling regarding advance care planning and goals of care 08/13/2018   Port catheter in place 08/03/2017   GERD (gastroesophageal reflux disease) 03/25/2017   Anemia 03/25/2017   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    Past Medical History:  Diagnosis Date   Anemia    Cancer (HCC) dx'd 11/2016   Gastric Adenocarcinoma   Gastric outlet obstruction 01/2017   GERD (gastroesophageal reflux disease)    Pre-diabetes    denies   SVT (supraventricular tachycardia) (HCC)    during Admission and Surgery- 01/2017    Past Surgical History:  Procedure Laterality Date   CHOLECYSTECTOMY N/A 01/10/2017   Procedure: CHOLECYSTECTOMY;  Surgeon: Jina Nephew, MD;  Location: [facility] OR;  Service: General;  Laterality: N/A;   COLONOSCOPY     COLONOSCOPY  10/11/2011   Procedure: COLONOSCOPY;  Surgeon: [name] CHRISTELLA Hollingshead, MD;  Location: [facility];  Service: Endoscopy;  Laterality: N/A;  10:30 AM   ESOPHAGOGASTRODUODENOSCOPY N/A 12/30/2016   Procedure: ESOPHAGOGASTRODUODENOSCOPY (EGD);  Surgeon: Belvie Just, MD;  Location: [facility];  Service: Endoscopy;  Laterality: N/A;   GASTRECTOMY N/A 01/10/2017   Procedure: DISTAL GASTRECTOMY, JEJUNUM  FEEDING TUBE PLACEMENT;  Surgeon: Jina Nephew,  MD;  Location: [facility] OR;  Service: General;  Laterality: N/A;   IR GENERIC HISTORICAL  01/24/2017   IR REPLC DUODEN/JEJUNO TUBE PERCUT W/FLUORO 01/24/2017 Marcey Moan, MD [facility]   IR [facility] DUODEN/JEJUNO TUBE PERCUT W/FLUORO  03/26/2017   PORTACATH PLACEMENT N/A 02/23/2017   Procedure: INSERTION PORT-A-CATH;  Surgeon: Jina Nephew, MD;  Location: [facility] OR;  Service: General;  Laterality: N/A;   testicle removed  1982    Social History   Socioeconomic History    Marital status: Married    Spouse name: Arland   Number of children: Not on file   Years of education: Not on file   Highest education level: Not on file  Occupational History   Not on file  Tobacco Use   Smoking status: Never   Smokeless tobacco: Never  Vaping Use   Vaping status: Unknown  Substance and Sexual Activity   Alcohol use: No    Comment: occ beer or wine but none in last 6 months    Drug use: No   Sexual activity: Not on file  Other Topics Concern   Not on file  Social History Narrative   Lives with wife-2025   Social Drivers of Health   Financial Resource Strain: Low Risk  (06/05/2024)   Overall Financial Resource Strain (CARDIA)    Difficulty of Paying Living Expenses: Not hard at all  Food Insecurity: No Food Insecurity (06/05/2024)   Hunger Vital Sign    Worried About Running Out of Food in the Last Year: Never true    Ran Out of Food in the Last Year: Never true  Transportation Needs: No Transportation Needs (06/05/2024)   PRAPARE - Administrator, Civil Service (Medical): No    Lack of Transportation (Non-Medical): No  Physical Activity: Sufficiently Active (06/05/2023)   Exercise Vital Sign    Days of Exercise per Week: 7 days    Minutes of Exercise per Session: 30 min  Stress: No Stress Concern Present (06/05/2024)   [employer] of Occupational Health - Occupational Stress Questionnaire    Feeling of Stress: Not at all  Social Connections: Moderately Isolated (06/05/2024)   Social Connection and Isolation Panel    Frequency of Communication with Friends and Family: More than three times a week    Frequency of Social Gatherings with Friends and Family: More than three times a week    Attends Religious Services: Never    Database administrator or Organizations: No    Attends Banker Meetings: Never    Marital Status: Married  Catering manager Violence: Not At Risk (06/05/2024)   Humiliation, Afraid, Rape, and Kick  questionnaire    Fear of Current or Ex-Partner: No    Emotionally Abused: No    Physically Abused: No    Sexually Abused: No    Family History  Problem Relation Age of Onset   Prostate cancer Father    Colon cancer Neg Hx      Review of Systems  Constitutional:  Positive for malaise/fatigue. Negative for chills and fever.  HENT: Negative.  Negative for congestion and sore throat.   Respiratory: Negative.  Negative for cough and shortness of breath.   Cardiovascular: Negative.  Negative for chest pain and palpitations.  Gastrointestinal:  Negative for abdominal pain, diarrhea, nausea and vomiting.  Genitourinary: Negative.  Negative for dysuria and hematuria.  Skin: Negative.  Negative for rash.  Neurological: Negative.  Negative for dizziness and headaches.  All other systems  reviewed and are negative.   Today's Vitals   06/25/24 1256  BP: 122/72  Pulse: 64  Temp: 98.1 F (36.7 C)  TempSrc: Oral  SpO2: 98%  Weight: 291 lb (132 kg)  Height: 5' 11 (1.803 m)   Body mass index is 40.59 kg/m.   Physical Exam Vitals reviewed.  Constitutional:      Appearance: Normal appearance.  HENT:     Head: Normocephalic.     Mouth/Throat:     Mouth: Mucous membranes are moist.     Pharynx: Oropharynx is clear.  Eyes:     Extraocular Movements: Extraocular movements intact.     Conjunctiva/sclera: Conjunctivae normal.     Pupils: Pupils are equal, round, and reactive to light.  Cardiovascular:     Rate and Rhythm: Normal rate and regular rhythm.     Pulses: Normal pulses.     Heart sounds: Normal heart sounds.  Pulmonary:     Effort: Pulmonary effort is normal.     Breath sounds: Normal breath sounds.  Skin:    General: Skin is warm and dry.  Neurological:     Mental Status: He is alert and oriented to person, place, and time.  Psychiatric:        Mood and Affect: Mood normal.        Behavior: Behavior normal.    Results for orders placed or performed in visit on  06/25/24 (from the past 24 hours)  POCT HgB A1C     Status: Abnormal   Collection Time: 06/25/24  1:09 PM  Result Value Ref Range   Hemoglobin A1C 12.1 (A) 4.0 - 5.6 %   HbA1c POC (<> result, manual entry)     HbA1c, POC (prediabetic range)     HbA1c, POC (controlled diabetic range)        ASSESSMENT & PLAN: A total of 44 minutes was spent with the patient and counseling/coordination of care regarding preparing for this visit, review of most recent office visit notes, review of multiple chronic medical conditions and their management, cardiovascular risks associated with uncontrolled diabetes, review of all medications and changes made, review of most recent bloodwork results including interpretation of today's hemoglobin A1c, review of health maintenance items, education on nutrition, prognosis, documentation, and need for follow up.   Problem List Items Addressed This Visit       Cardiovascular and Mediastinum   Hypertension associated with diabetes (HCC) - Primary   BP Readings from Last 3 Encounters:  06/25/24 122/72  09/21/23 118/72  03/20/23 126/68  Well-controlled hypertension Continue olmesartan  20 mg daily Uncontrolled diabetes with hemoglobin A1c higher than before at 12.1 Did not start Jardiance  as recommended last time Continues Trulicity  1.5 mg weekly Recommend to start glipizide  5 mg twice a day Cardiovascular risks associated with uncontrolled diabetes discussed Diet and nutrition discussed Follow-up in 3 months       Relevant Medications   glipiZIDE  (GLUCOTROL ) 5 MG tablet     Digestive   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 Stable. No problems         Endocrine   Diabetic polyneuropathy associated with type 2 diabetes mellitus (HCC)   Stable and well-controlled. Pretty asymptomatic.       Relevant Medications   glipiZIDE  (GLUCOTROL ) 5 MG tablet   Dyslipidemia associated with type 2 diabetes mellitus (HCC)   Uncontrolled  diabetes with hemoglobin A1c at 12.1 Continue weekly Trulicity  1.5 mg Start glipizide  5 mg twice a day  Continue rosuvastatin  10 mg daily Diet and nutrition discussed Follow-up in 3 months      Relevant Medications   glipiZIDE  (GLUCOTROL ) 5 MG tablet     Other   Body mass index 40.0-44.9, adult (HCC)   Diet and nutrition discussed Cardiovascular risks associated with obesity discussed Advised to decrease amount of daily carbohydrate intake and daily calories and increase amount of plant-based protein in his diet      Relevant Medications   glipiZIDE  (GLUCOTROL ) 5 MG tablet   Other Visit Diagnoses       Type II diabetes mellitus with manifestations (HCC)       Relevant Medications   glipiZIDE  (GLUCOTROL ) 5 MG tablet   Other Relevant Orders   POCT HgB A1C (Completed)     Screening for colon cancer       Relevant Orders   Cologuard        Patient Instructions  Diabetes Mellitus and Nutrition, Adult When you have diabetes, or diabetes mellitus, it is very important to have healthy eating habits because your blood sugar (glucose) levels are greatly affected by what you eat and drink. Eating healthy foods in the right amounts, at about the same times every day, can help you: Manage your blood glucose. Lower your risk of heart disease. Improve your blood pressure. Reach or maintain a healthy weight. What can affect my meal plan? Every person with diabetes is different, and each person has different needs for a meal plan. Your health care provider may recommend that you work with a dietitian to make a meal plan that is best for you. Your meal plan may vary depending on factors such as: The calories you need. The medicines you take. Your weight. Your blood glucose, blood pressure, and cholesterol levels. Your activity level. Other health conditions you have, such as heart or kidney disease. How do carbohydrates affect me? Carbohydrates, also called carbs, affect your blood  glucose level more than any other type of food. Eating carbs raises the amount of glucose in your blood. It is important to know how many carbs you can safely have in each meal. This is different for every person. Your dietitian can help you calculate how many carbs you should have at each meal and for each snack. How does alcohol affect me? Alcohol can cause a decrease in blood glucose (hypoglycemia), especially if you use insulin  or take certain diabetes medicines by mouth. Hypoglycemia can be a life-threatening condition. Symptoms of hypoglycemia, such as sleepiness, dizziness, and confusion, are similar to symptoms of having too much alcohol. Do not drink alcohol if: Your health care provider tells you not to drink. You are pregnant, may be pregnant, or are planning to become pregnant. If you drink alcohol: Limit how much you have to: 0-1 drink a day for women. 0-2 drinks a day for men. Know how much alcohol is in your drink. In the [place] one drink equals one 12 oz bottle of beer (355 mL), one 5 oz glass of wine (148 mL), or one 1 oz glass of hard liquor (44 mL). Keep yourself hydrated with water , diet soda, or unsweetened iced tea. Keep in mind that regular soda, juice, and other mixers may contain a lot of sugar and must be counted as carbs. What are tips for following this plan?  Reading food labels Start by checking the serving size on the Nutrition Facts label of packaged foods and drinks. The number of calories and the amount of carbs, fats, and other  nutrients listed on the label are based on one serving of the item. Many items contain more than one serving per package. Check the total grams (g) of carbs in one serving. Check the number of grams of saturated fats and trans fats in one serving. Choose foods that have a low amount or none of these fats. Check the number of milligrams (mg) of salt (sodium) in one serving. Most people should limit total sodium intake to less than 2,300 mg  per day. Always check the nutrition information of foods labeled as low-fat or nonfat. These foods may be higher in added sugar or refined carbs and should be avoided. Talk to your dietitian to identify your daily goals for nutrients listed on the label. Shopping Avoid buying canned, pre-made, or processed foods. These foods tend to be high in fat, sodium, and added sugar. Shop around the outside edge of the grocery store. This is where you will most often find fresh fruits and vegetables, bulk grains, fresh meats, and fresh dairy products. Cooking Use low-heat cooking methods, such as baking, instead of high-heat cooking methods, such as deep frying. Cook using healthy oils, such as olive, canola, or sunflower oil. Avoid cooking with butter, cream, or high-fat meats. Meal planning Eat meals and snacks regularly, preferably at the same times every day. Avoid going long periods of time without eating. Eat foods that are high in fiber, such as fresh fruits, vegetables, beans, and whole grains. Eat 4-6 oz (112-168 g) of lean protein each day, such as lean meat, chicken, fish, eggs, or tofu. One ounce (oz) (28 g) of lean protein is equal to: 1 oz (28 g) of meat, chicken, or fish. 1 egg.  cup (62 g) of tofu. Eat some foods each day that contain healthy fats, such as avocado, nuts, seeds, and fish. What foods should I eat? Fruits Berries. Apples. Oranges. Peaches. Apricots. Plums. Grapes. Mangoes. Papayas. Pomegranates. Kiwi. Cherries. Vegetables Leafy greens, including lettuce, spinach, kale, chard, collard greens, mustard greens, and cabbage. Beets. Cauliflower. Broccoli. Carrots. Green beans. Tomatoes. Peppers. Onions. Cucumbers. Brussels sprouts. Grains Whole grains, such as whole-wheat or whole-grain bread, crackers, tortillas, cereal, and pasta. Unsweetened oatmeal. Quinoa. Brown or wild rice. Meats and other proteins Seafood. Poultry without skin. Lean cuts of poultry and beef. Tofu.  Nuts. Seeds. Dairy Low-fat or fat-free dairy products such as milk, yogurt, and cheese. The items listed above may not be a complete list of foods and beverages you can eat and drink. Contact a dietitian for more information. What foods should I avoid? Fruits Fruits canned with syrup. Vegetables Canned vegetables. Frozen vegetables with butter or cream sauce. Grains Refined white flour and flour products such as bread, pasta, snack foods, and cereals. Avoid all processed foods. Meats and other proteins Fatty cuts of meat. Poultry with skin. Breaded or fried meats. Processed meat. Avoid saturated fats. Dairy Full-fat yogurt, cheese, or milk. Beverages Sweetened drinks, such as soda or iced tea. The items listed above may not be a complete list of foods and beverages you should avoid. Contact a dietitian for more information. Questions to ask a health care provider Do I need to meet with a certified diabetes care and education specialist? Do I need to meet with a dietitian? What number can I call if I have questions? When are the best times to check my blood glucose? Where to find more information: American Diabetes Association: diabetes.org Academy of Nutrition and Dietetics: eatright.[employer] of Diabetes and Digestive and Kidney  Diseases: StageSync.si Association of Diabetes Care & Education Specialists: diabeteseducator.org Summary It is important to have healthy eating habits because your blood sugar (glucose) levels are greatly affected by what you eat and drink. It is important to use alcohol carefully. A healthy meal plan will help you manage your blood glucose and lower your risk of heart disease. Your health care provider may recommend that you work with a dietitian to make a meal plan that is best for you. This information is not intended to replace advice given to you by your health care provider. Make sure you discuss any questions you have with your health  care provider. Document Revised: 06/02/2020 Document Reviewed: 06/03/2020 Elsevier Patient Education  2024 Elsevier Inc.     Emil Schaumann, MD [facility]

## 2024-06-25 NOTE — Assessment & Plan Note (Signed)
In remission.  Stable.  No problems.

## 2024-06-25 NOTE — Assessment & Plan Note (Signed)
 Diet and nutrition discussed Cardiovascular risks associated with obesity discussed Advised to decrease amount of daily carbohydrate intake and daily calories and increase amount of plant-based protein in his diet

## 2024-06-25 NOTE — Assessment & Plan Note (Signed)
 Uncontrolled diabetes with hemoglobin A1c at 12.1 Continue weekly Trulicity  1.5 mg Start glipizide  5 mg twice a day Continue rosuvastatin  10 mg daily Diet and nutrition discussed Follow-up in 3 months

## 2024-06-26 DIAGNOSIS — Z23 Encounter for immunization: Secondary | ICD-10-CM | POA: Diagnosis not present

## 2024-06-27 ENCOUNTER — Other Ambulatory Visit: Payer: Self-pay

## 2024-06-27 DIAGNOSIS — C160 Malignant neoplasm of cardia: Secondary | ICD-10-CM

## 2024-07-01 ENCOUNTER — Inpatient Hospital Stay

## 2024-07-01 ENCOUNTER — Inpatient Hospital Stay (HOSPITAL_BASED_OUTPATIENT_CLINIC_OR_DEPARTMENT_OTHER): Admitting: Hematology

## 2024-07-01 VITALS — BP 122/56 | HR 57 | Temp 97.9°F | Resp 20 | Wt 291.6 lb

## 2024-07-01 DIAGNOSIS — C160 Malignant neoplasm of cardia: Secondary | ICD-10-CM

## 2024-07-01 DIAGNOSIS — Z08 Encounter for follow-up examination after completed treatment for malignant neoplasm: Secondary | ICD-10-CM | POA: Diagnosis not present

## 2024-07-01 DIAGNOSIS — Z85028 Personal history of other malignant neoplasm of stomach: Secondary | ICD-10-CM | POA: Diagnosis not present

## 2024-07-01 LAB — CBC WITH DIFFERENTIAL (CANCER CENTER ONLY)
Abs Immature Granulocytes: 0.01 K/uL (ref 0.00–0.07)
Basophils Absolute: 0 K/uL (ref 0.0–0.1)
Basophils Relative: 1 %
Eosinophils Absolute: 0 K/uL (ref 0.0–0.5)
Eosinophils Relative: 1 %
HCT: 34.6 % — ABNORMAL LOW (ref 39.0–52.0)
Hemoglobin: 11.7 g/dL — ABNORMAL LOW (ref 13.0–17.0)
Immature Granulocytes: 0 %
Lymphocytes Relative: 22 %
Lymphs Abs: 1 K/uL (ref 0.7–4.0)
MCH: 29 pg (ref 26.0–34.0)
MCHC: 33.8 g/dL (ref 30.0–36.0)
MCV: 85.9 fL (ref 80.0–100.0)
Monocytes Absolute: 0.5 K/uL (ref 0.1–1.0)
Monocytes Relative: 9 %
Neutro Abs: 3.2 K/uL (ref 1.7–7.7)
Neutrophils Relative %: 67 %
Platelet Count: 148 K/uL — ABNORMAL LOW (ref 150–400)
RBC: 4.03 MIL/uL — ABNORMAL LOW (ref 4.22–5.81)
RDW: 13.5 % (ref 11.5–15.5)
WBC Count: 4.8 K/uL (ref 4.0–10.5)
nRBC: 0 % (ref 0.0–0.2)

## 2024-07-01 LAB — CMP (CANCER CENTER ONLY)
ALT: 27 U/L (ref 0–44)
AST: 28 U/L (ref 15–41)
Albumin: 4.1 g/dL (ref 3.5–5.0)
Alkaline Phosphatase: 53 U/L (ref 38–126)
Anion gap: 6 (ref 5–15)
BUN: 11 mg/dL (ref 8–23)
CO2: 26 mmol/L (ref 22–32)
Calcium: 9.4 mg/dL (ref 8.9–10.3)
Chloride: 106 mmol/L (ref 98–111)
Creatinine: 1.04 mg/dL (ref 0.61–1.24)
GFR, Estimated: >60 mL/min (ref >60)
Glucose, Bld: 216 mg/dL — ABNORMAL HIGH (ref 70–99)
Potassium: 4.6 mmol/L (ref 3.5–5.1)
Sodium: 138 mmol/L (ref 135–145)
Total Bilirubin: 0.5 mg/dL (ref 0.0–1.2)
Total Protein: 7.1 g/dL (ref 6.5–8.1)

## 2024-07-02 LAB — CEA (ACCESS): CEA (CHCC): 4.51 ng/mL (ref 0.00–5.00)

## 2024-07-07 ENCOUNTER — Encounter: Payer: Self-pay | Admitting: Hematology

## 2024-07-07 NOTE — Progress Notes (Signed)
 HEMATOLOGY/ONCOLOGY CLINIC NOTE  Date of Service:.07/01/2024   Patient Care Team: Purcell Emil Schanz, MD as PCP - General (Internal Medicine) Jina Nephew MD (Surgery)    CHIEF COMPLAINTS/PURPOSE OF CONSULTATION:   Follow-up acute evaluation and management of gastric adenocarcinoma on active surveillance  HISTORY OF PRESENTING ILLNESS:   [name] is a wonderful 73 y.o. male is here for continued evaluation and management of recently diagnosed pT4a, pN3b, pM1 Gastric Adenocarcinoma.   Patient was previously seen in the hospital on 01/19/2017 with a diagnosis of gastric adenocarcinoma in the gastric pylorus causing significant intrinsic stenosis . Patient had presented to primary care physician with dyspeptic symptoms, partial gastric outlet obstruction and weight loss of 60 pounds. Patient has had a distal gastrectomy, cholecystectomy with jejunal feeding tube placement by Dr. Nephew on 01/10/2017 for gastric obstruction secondary to adenocarcinoma of the gastric pylorus. He was noted to have invaded into the pancreas and a bit of the anterior pancreatic head was resected en bloc with the tumor.   Pathology showed   INVASIVE MODERATELY TO POORLY DIFFERENTIATED ADENOCARCINOMA, SPANNING 4 CM IN GREATEST DIMENSION. TUMOR INVADES THROUGH STOMACH WALL TO INVOLVE SEROSAL SURFACE.  LYMPH/VASCULAR INVASION IS IDENTIFIED.  MARGINS ARE NEGATIVE.- FIFTEEN OF THIRTY NINE LYMPH NODES POSITIVE FOR METASTATIC ADENOCARCINOMA (15/39). EXTRACAPSULAR EXTENSION IS IDENTIFIED. 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  Patient had a prolonged hospitalization post surgery  due to significant NG tube output. Needed epidural and PCA for pain control. It took a while for him to be able to tolerate progression of 2 feeding. He had an abdominal drain due to a small collection near his pancreatic head. Was treated with  Augmentin  for this. Patient was in the hospital from 01/10/2017 and was eventually discharged on 01/27/2017.  Patient postponed several attempts at posthospitalization follow-up. He has been recovering at home and has been optimizing his tube feeding. Patient was recently followed up by Dr. Nephew and has had a port placement on 02/23/2017 and preparation for possible chemotherapy.  He notes that his tube feeding is up to 40 mL per hour for 12 hours and has targeted is 16 ml per hour. He was recently admitted to [facility] in [place] for a C. difficile colitis that caused a severe diarrhea. he is currently on oral vancomycin  has completed about 5 days of treatment and is also on probiotics.  CURRENT THERAPY: Active surveillance  PREVIOUS THERAPY:  Concurrent Chemoradiation with Xeloda . He started 07/18/17 and completed 08/25/17  INTERVAL HISTORY   [name] is here for continued active surveillance of his gastric adenocarcinoma. He was last seen in January 2024 and is here for his yearly surveillance.  He is now about nearly 7 years from his initial treatment. He notes no new abdominal pain or discomfort.  No abdominal distention.  No black stools or blood in the stools.  Stable or increased weight.  No weight loss. Good p.o. intake and good energy levels. No other acute new symptoms. 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  MEDICAL HISTORY:  Past Medical History:  Diagnosis Date   Anemia    Cancer (HCC) dx'd 11/2016   Gastric Adenocarcinoma   Gastric outlet obstruction 01/2017   GERD (gastroesophageal reflux disease)    Pre-diabetes    denies   SVT (supraventricular tachycardia) (HCC)    during Admission and Surgery- 01/2017    SURGICAL HISTORY: Past Surgical History:  Procedure Laterality Date   CHOLECYSTECTOMY N/A 01/10/2017   Procedure: CHOLECYSTECTOMY;  Surgeon: Jina Nephew, MD;  Location: [facility] OR;  Service: General;  Laterality: N/A;   COLONOSCOPY      COLONOSCOPY  10/11/2011   Procedure: COLONOSCOPY;  Surgeon: [name] CHRISTELLA Hollingshead, MD;  Location: [facility];  Service: Endoscopy;  Laterality: N/A;  10:30 AM   ESOPHAGOGASTRODUODENOSCOPY N/A 12/30/2016   Procedure: ESOPHAGOGASTRODUODENOSCOPY (EGD);  Surgeon: Belvie Just, MD;  Location: [facility];  Service: Endoscopy;  Laterality: N/A;   GASTRECTOMY N/A 01/10/2017   Procedure: DISTAL GASTRECTOMY, JEJUNUM  FEEDING TUBE PLACEMENT;  Surgeon: Jina Nephew, MD;  Location: [facility] OR;  Service: General;  Laterality: N/A;   IR GENERIC HISTORICAL  01/24/2017   IR REPLC DUODEN/JEJUNO TUBE PERCUT W/FLUORO 01/24/2017 Marcey Moan, MD [facility]   IR [facility] DUODEN/JEJUNO TUBE PERCUT W/FLUORO  03/26/2017   PORTACATH PLACEMENT N/A 02/23/2017   Procedure: INSERTION PORT-A-CATH;  Surgeon: Jina Nephew, MD;  Location: [facility] OR;  Service: General;  Laterality: N/A;   testicle removed  1982    SOCIAL HISTORY: Social History   Socioeconomic History   Marital status: Married    Spouse name: Arland   Number of children: Not on file   Years of education: Not on file   Highest education level: Not on file  Occupational History   Not on file  Tobacco Use   Smoking status: Never   Smokeless tobacco: Never  Vaping Use   Vaping status: Unknown  Substance and Sexual Activity   Alcohol use: No    Comment: occ beer or wine but none in last 6 months    Drug use: No   Sexual activity: Not on file  Other Topics Concern   Not on file  Social History Narrative   Lives with wife-2025   Social Drivers of Health   Financial Resource Strain: Low Risk  (06/05/2024)   Overall Financial Resource Strain (CARDIA)    Difficulty of Paying Living Expenses: Not hard at all  Food Insecurity: No Food Insecurity (06/05/2024)   Hunger Vital Sign    Worried About Running Out of Food in the Last Year: Never true    Ran Out of Food in the Last Year: Never true  Transportation Needs: No Transportation Needs (06/05/2024)   PRAPARE -  Administrator, Civil Service (Medical): No    Lack of Transportation (Non-Medical): No  Physical Activity: Sufficiently Active (06/05/2023)   Exercise Vital Sign    Days of Exercise per Week: 7 days    Minutes of Exercise per Session: 30 min  Stress: No Stress Concern Present (06/05/2024)   [employer] of Occupational Health - Occupational Stress Questionnaire    Feeling of Stress: Not at all  Social Connections: Moderately Isolated (06/05/2024)   Social Connection and Isolation Panel    Frequency of Communication with Friends and Family: More than three times a week    Frequency of Social Gatherings with Friends and Family: More than three times a week    Attends Religious Services: Never    Database administrator or Organizations: No    Attends Banker Meetings: Never    Marital Status: Married  Catering manager Violence: Not At Risk (06/05/2024)   Humiliation, Afraid, Rape, and Kick questionnaire    Fear of Current or Ex-Partner: No    Emotionally Abused: No    Physically Abused: No    Sexually Abused: No    FAMILY HISTORY: Family History  Problem Relation Age of Onset   Prostate cancer Father    Colon cancer Neg Hx     ALLERGIES:  is allergic to no known allergies.  MEDICATIONS:  Current Outpatient Medications  Medication Sig Dispense Refill   Dulaglutide  (TRULICITY ) 1.5 MG/0.5ML SOAJ Inject 1.5 mg into the skin once a week. 2 mL 5   ferrous sulfate  325 (65 FE) MG tablet Take 325 mg by mouth daily with breakfast.     glipiZIDE  (GLUCOTROL ) 5 MG tablet Take 1 tablet (5 mg total) by mouth 2 (two) times daily before a meal. 180 tablet 3   ketoconazole  (NIZORAL ) 2 % cream Apply 1 Application topically daily. 30 g 5   Multiple Vitamin (MULTIVITAMIN) tablet Take 1 tablet by mouth daily.     olmesartan  (BENICAR ) 20 MG tablet Take 1 tablet (20 mg total) by mouth daily. 90 tablet 3   rosuvastatin  (CRESTOR ) 10 MG tablet Take 1 tablet (10 mg total) by  mouth daily. 90 tablet 3   No current facility-administered medications for this visit.   10 Point review of Systems was done is negative except as noted above.  PHYSICAL EXAMINATION:  .BP (!) 122/56   Pulse (!) 57   Temp 97.9 F (36.6 C)   Resp 20   Wt 291 lb 9.6 oz (132.3 kg)   SpO2 99%   BMI 40.67 kg/m  . GENERAL:alert, in no acute distress and comfortable SKIN: no acute rashes, no significant lesions EYES: conjunctiva are pink and non-injected, sclera anicteric OROPHARYNX: MMM, no exudates, no oropharyngeal erythema or ulceration NECK: supple, no JVD LYMPH:  no palpable lymphadenopathy in the cervical, axillary or inguinal regions LUNGS: clear to auscultation b/l with normal respiratory effort HEART: regular rate & rhythm ABDOMEN:  normoactive bowel sounds , non tender, not distended. Extremity: no pedal edema PSYCH: alert & oriented x 3 with fluent speech NEURO: no focal motor/sensory deficits   LABORATORY DATA:  I have reviewed the data as listed     Latest Ref Rng & Units 07/01/2024    2:46 PM 09/21/2023    2:17 PM 12/12/2022    1:41 PM  CBC  WBC 4.0 - 10.5 K/uL 4.8  5.3  5.1   Hemoglobin 13.0 - 17.0 g/dL 88.2  87.5  87.6   Hematocrit 39.0 - 52.0 % 34.6  37.3  36.6   Platelets 150 - 400 K/uL 148  173.0  144        Latest Ref Rng & Units 07/01/2024    2:46 PM 03/20/2024    2:53 PM 09/21/2023    2:17 PM  CMP  Glucose 70 - 99 mg/dL 783  638  828   BUN 8 - 23 mg/dL 11  19  14    Creatinine 0.61 - 1.24 mg/dL 8.95  8.83  8.98   Sodium 135 - 145 mmol/L 138  133  136   Potassium 3.5 - 5.1 mmol/L 4.6  4.9  4.3   Chloride 98 - 111 mmol/L 106  99  102   CO2 22 - 32 mmol/L 26  26  28    Calcium  8.9 - 10.3 mg/dL 9.4  9.3  9.5   Total Protein 6.5 - 8.1 g/dL 7.1  7.3  7.6   Total Bilirubin 0.0 - 1.2 mg/dL 0.5  0.5  0.5   Alkaline Phos 38 - 126 U/L 53  60  65   AST 15 - 41 U/L 28  21  24    ALT 0 - 44 U/L  27  25  26     CEA 4.5    RADIOGRAPHIC STUDIES: I have  personally reviewed the radiological images as listed and agreed with the findings in the report. No results found.   ASSESSMENT & PLAN:   73 y.o.  African-American male with   1) Resected Stage IV pT4a, pN3b, pM1 invasive moderately-poorly Gastric Adenocarcinoma . Her 2 neg by FISH LVI + margins neg 15/39 LN +ve, Extracapsular invasion +, some involvement of pancreatic capsule there pM1 Presented with severe pyloric stenosis with ulceration. S/p 60 pound weight loss- now resolved. S/p distal gastrectomy, cholecystectomy and en bloc dissection off the small part of the pancreatic head  with jejunal feeding tube placement. Initial CT abdomen and pelvis did not show any local regional lymphadenopathy. Rpt CT chest/abd/pelvis from 03/22/2017 - no overt evidence of residual disease or metastatic disease. Post-surgical peripancreatic fluid collection resolving. CT chest/abd/pelvis 06/21/2017 after 6 cycles of FOLFOX -  No evidence of residual or metastatic carcinoma within the chest, abdomen, or pelvis -Started Chemoradiation on 07/18/17 with Xeloda  (825mg /m2 twice daily Monday through Friday while on radiation) and completed treatment on 08/24/2017. CT chest/abd/pelvis (11/16/2017) - show radiation related changes but no evidence of tumor progression at this time.  07/13/18 CT C/A/P revealed No evidence of metastatic disease in the chest, abdomen or pelvis. Previously described mildly enlarged high left para-aortic retroperitoneal node is decreased and now normal in size. Previously described peripancreatic fat stranding is decreased and probably treatment related.   03/11/19 CT A/P revealed Status post distal gastrectomy, without findings to suggest local recurrence of disease or definite metastatic disease in the abdomen or pelvis. 2. Colonic diverticulosis without evidence of acute diverticulitis at this time. 3. Incidental findings, as above, similar to prior studies.  09/30/2019 CT C/A/P  ([id number])([phone]) revealed 1. Status post distal gastrectomy with gastrojejunostomy. No specific findings identified to suggest local tumor recurrence or metastatic disease. 2. Aortic atherosclerosis. Coronary artery calcifications noted. Aortic Atherosclerosis (ICD10-I70.0).  #2 status post port placement on 02/23/2017 by Dr. Aron   #3 h/o Clostridium difficile colitis -. No overt issues with diarrhea currently.   #4 suspected sleep apnea. H   PLAN: - Discussed lab results with him today. CBC is stable with a hemoglobin of 11.7 normal WBC count and platelets CMP within normal limits other than a blood sugar level of 216 CEA tumor marker is within normal limits at 4.5 Patient has no new symptoms or signs suggestive of recurrence/progression of his gastric adenocarcinoma. - He has not been in no evidence of disease status for 7 years. - He was recommended to continue follow-up with his gastroenterologist for age-appropriate cancer screening and for screening colonoscopies. - Patient will see his primary care physician and follow-up in 6 months and we shall see him back for active surveillance in a year. - He is aware of symptoms to monitor for and will call us  immediately if any concerns arise. Other age-appropriate cancer screening and vaccinations with PCP  FOLLOW-UP: Return to clinic with PCP in 6 months Return to clinic with Dr. Onesimo with labs in 12 months  .The total time spent in the appointment was 30 minutes* .  All of the patient's questions were answered with apparent satisfaction. The patient knows to call the clinic with any problems, questions or concerns.   Emaline Onesimo MD MS AAHIVMS [facility] Hematology/Oncology Physician [facility]  .*Total Encounter Time as defined by the Centers for Medicare and Medicaid Services includes, in  addition to the face-to-face time of a patient visit (documented in the note above) non-face-to-face time: obtaining and  reviewing outside history, ordering and reviewing medications, tests or procedures, care coordination (communications with other health care professionals or caregivers) and documentation in the medical record.

## 2024-09-15 ENCOUNTER — Other Ambulatory Visit: Payer: Self-pay | Admitting: Emergency Medicine

## 2024-09-15 DIAGNOSIS — I152 Hypertension secondary to endocrine disorders: Secondary | ICD-10-CM

## 2024-09-15 DIAGNOSIS — E1169 Type 2 diabetes mellitus with other specified complication: Secondary | ICD-10-CM

## 2024-09-21 DIAGNOSIS — Z23 Encounter for immunization: Secondary | ICD-10-CM | POA: Diagnosis not present

## 2024-09-25 ENCOUNTER — Ambulatory Visit: Admitting: Emergency Medicine

## 2024-09-30 ENCOUNTER — Ambulatory Visit: Admitting: Emergency Medicine

## 2024-09-30 ENCOUNTER — Encounter: Payer: Self-pay | Admitting: Emergency Medicine

## 2024-09-30 VITALS — BP 132/82 | HR 70 | Temp 97.5°F | Ht 71.0 in | Wt 298.0 lb

## 2024-09-30 DIAGNOSIS — E785 Hyperlipidemia, unspecified: Secondary | ICD-10-CM

## 2024-09-30 DIAGNOSIS — Z7985 Long-term (current) use of injectable non-insulin antidiabetic drugs: Secondary | ICD-10-CM | POA: Diagnosis not present

## 2024-09-30 DIAGNOSIS — Z6841 Body Mass Index (BMI) 40.0 and over, adult: Secondary | ICD-10-CM

## 2024-09-30 DIAGNOSIS — E1159 Type 2 diabetes mellitus with other circulatory complications: Secondary | ICD-10-CM

## 2024-09-30 DIAGNOSIS — Z85028 Personal history of other malignant neoplasm of stomach: Secondary | ICD-10-CM | POA: Diagnosis not present

## 2024-09-30 DIAGNOSIS — E1169 Type 2 diabetes mellitus with other specified complication: Secondary | ICD-10-CM | POA: Diagnosis not present

## 2024-09-30 DIAGNOSIS — I152 Hypertension secondary to endocrine disorders: Secondary | ICD-10-CM | POA: Diagnosis not present

## 2024-09-30 DIAGNOSIS — C160 Malignant neoplasm of cardia: Secondary | ICD-10-CM

## 2024-09-30 LAB — POCT GLYCOSYLATED HEMOGLOBIN (HGB A1C): Hemoglobin A1C: 6.6 % — AB (ref 4.0–5.6)

## 2024-09-30 NOTE — Assessment & Plan Note (Signed)
 Well-controlled diabetes with hemoglobin A1c at 6.6 Continue weekly Trulicity  1.5 mg Continue glipizide  5 mg twice a day Continue rosuvastatin  10 mg daily Diet and nutrition discussed Follow-up in 3 months

## 2024-09-30 NOTE — Assessment & Plan Note (Signed)
 Diet and nutrition discussed Cardiovascular risks associated with obesity discussed Advised to decrease amount of daily carbohydrate intake and daily calories and increase amount of plant based protein in his diet Presently on Trulicity  1.5 mg weekly

## 2024-09-30 NOTE — Assessment & Plan Note (Signed)
 BP Readings from Last 3 Encounters:  09/30/24 132/82  07/01/24 (!) 122/56  06/25/24 122/72  Well-controlled hypertension Continue olmesartan  20 mg daily Well-controlled diabetes with hemoglobin A1c at 6.6 Continues Trulicity  1.5 mg weekly Recommend to continue glipizide  5 mg twice a day Cardiovascular risks associated with uncontrolled diabetes discussed Diet and nutrition discussed Follow-up in 3 months

## 2024-09-30 NOTE — Patient Instructions (Signed)

## 2024-09-30 NOTE — Assessment & Plan Note (Signed)
 In remission. Stable. No problems  Saw his oncologist for annual exam recently.  No concerns. Not getting any treatment at present time

## 2024-09-30 NOTE — Progress Notes (Signed)
 [name] 73 y.o.   Chief Complaint  Patient presents with   Medical Management of Chronic Issues    3 month f/u    HISTORY OF PRESENT ILLNESS: This is a 73 y.o. male A1A here for 108-month follow-up of chronic medical conditions including diabetes and hypertension Overall doing well.  Has no complaints or medical concerns today. Wt Readings from Last 3 Encounters:  09/30/24 298 lb (135.2 kg)  07/01/24 291 lb 9.6 oz (132.3 kg)  06/25/24 291 lb (132 kg)   Lab Results  Component Value Date   HGBA1C 12.1 (A) 06/25/2024   BP Readings from Last 3 Encounters:  07/01/24 (!) 122/56  06/25/24 122/72  09/21/23 118/72     HPI   Prior to Admission medications   Medication Sig Start Date End Date Taking? Authorizing Provider  ferrous sulfate  325 (65 FE) MG tablet Take 325 mg by mouth daily with breakfast.   Yes [provider]  glipiZIDE  (GLUCOTROL ) 5 MG tablet Take 1 tablet (5 mg total) by mouth 2 (two) times daily before a meal. 06/25/24  Yes Horris Speros, Emil Schanz, MD  ketoconazole  (NIZORAL ) 2 % cream Apply 1 Application topically daily. 09/21/23  Yes Jenisa Monty, Emil Schanz, MD  Multiple Vitamin (MULTIVITAMIN) tablet Take 1 tablet by mouth daily.   Yes [provider]  olmesartan  (BENICAR ) 20 MG tablet Take 1 tablet (20 mg total) by mouth daily. 03/20/24  Yes Leydi Winstead, Emil Schanz, MD  rosuvastatin  (CRESTOR ) 10 MG tablet Take 1 tablet (10 mg total) by mouth daily. 03/20/24  Yes Fatih Stalvey, Emil Schanz, MD  TRULICITY  1.5 MG/0.5ML [facility] THE CONTENTS OF ONE PEN UNDER THE SKIN WEEKLY ON THE SAME DAY EACH WEEK 09/16/24  Yes Purcell Emil Schanz, MD    Allergies  Allergen Reactions   No Known Allergies     Patient Active Problem List   Diagnosis Date Noted   Body mass index 40.0-44.9, adult (HCC) 06/25/2024   Diabetic polyneuropathy associated with type 2 diabetes mellitus (HCC) 06/15/2021   Hyperlipidemia with target LDL less than 100 06/15/2021   Dyslipidemia  associated with type 2 diabetes mellitus (HCC) 06/15/2021   Hypertension associated with diabetes (HCC) 06/15/2021   Abnormal electrocardiogram (ECG) (EKG) 06/15/2021   Counseling regarding advance care planning and goals of care 08/13/2018   Port catheter in place 08/03/2017   GERD (gastroesophageal reflux disease) 03/25/2017   Anemia 03/25/2017   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    Past Medical History:  Diagnosis Date   Anemia    Cancer (HCC) dx'd 11/2016   Gastric Adenocarcinoma   Gastric outlet obstruction 01/2017   GERD (gastroesophageal reflux disease)    Pre-diabetes    denies   SVT (supraventricular tachycardia)    during Admission and Surgery- 01/2017    Past Surgical History:  Procedure Laterality Date   CHOLECYSTECTOMY N/A 01/10/2017   Procedure: CHOLECYSTECTOMY;  Surgeon: Jina Nephew, MD;  Location: [facility] OR;  Service: General;  Laterality: N/A;   COLONOSCOPY     COLONOSCOPY  10/11/2011   Procedure: COLONOSCOPY;  Surgeon: [name] CHRISTELLA Hollingshead, MD;  Location: [facility];  Service: Endoscopy;  Laterality: N/A;  10:30 AM   ESOPHAGOGASTRODUODENOSCOPY N/A 12/30/2016   Procedure: ESOPHAGOGASTRODUODENOSCOPY (EGD);  Surgeon: Belvie Just, MD;  Location: [facility];  Service: Endoscopy;  Laterality: N/A;   GASTRECTOMY N/A 01/10/2017   Procedure: DISTAL GASTRECTOMY, JEJUNUM  FEEDING TUBE PLACEMENT;  Surgeon: Jina Nephew, MD;  Location: [facility] OR;  Service: General;  Laterality:  N/A;   IR GENERIC HISTORICAL  01/24/2017   IR REPLC DUODEN/JEJUNO TUBE PERCUT W/FLUORO 01/24/2017 Marcey Moan, MD [facility]   IR [facility] DUODEN/JEJUNO TUBE PERCUT W/FLUORO  03/26/2017   PORTACATH PLACEMENT N/A 02/23/2017   Procedure: INSERTION PORT-A-CATH;  Surgeon: Jina Nephew, MD;  Location: [facility] OR;  Service: General;  Laterality: N/A;   testicle removed  1982    Social History   Socioeconomic History   Marital status: Married    Spouse name: Arland   Number of children: Not on  file   Years of education: Not on file   Highest education level: Not on file  Occupational History   Not on file  Tobacco Use   Smoking status: Never   Smokeless tobacco: Never  Vaping Use   Vaping status: Unknown  Substance and Sexual Activity   Alcohol use: No    Comment: occ beer or wine but none in last 6 months    Drug use: No   Sexual activity: Not on file  Other Topics Concern   Not on file  Social History Narrative   Lives with wife-2025   Social Drivers of Health   Financial Resource Strain: Low Risk  (06/05/2024)   Overall Financial Resource Strain (CARDIA)    Difficulty of Paying Living Expenses: Not hard at all  Food Insecurity: No Food Insecurity (06/05/2024)   Hunger Vital Sign    Worried About Running Out of Food in the Last Year: Never true    Ran Out of Food in the Last Year: Never true  Transportation Needs: No Transportation Needs (06/05/2024)   PRAPARE - Administrator, Civil Service (Medical): No    Lack of Transportation (Non-Medical): No  Physical Activity: Sufficiently Active (06/05/2023)   Exercise Vital Sign    Days of Exercise per Week: 7 days    Minutes of Exercise per Session: 30 min  Stress: No Stress Concern Present (06/05/2024)   [employer] of Occupational Health - Occupational Stress Questionnaire    Feeling of Stress: Not at all  Social Connections: Moderately Isolated (06/05/2024)   Social Connection and Isolation Panel    Frequency of Communication with Friends and Family: More than three times a week    Frequency of Social Gatherings with Friends and Family: More than three times a week    Attends Religious Services: Never    Database Administrator or Organizations: No    Attends Banker Meetings: Never    Marital Status: Married  Catering Manager Violence: Not At Risk (06/05/2024)   Humiliation, Afraid, Rape, and Kick questionnaire    Fear of Current or Ex-Partner: No    Emotionally Abused: No     Physically Abused: No    Sexually Abused: No    Family History  Problem Relation Age of Onset   Prostate cancer Father    Colon cancer Neg Hx      Review of Systems  Constitutional: Negative.  Negative for chills and fever.  HENT: Negative.  Negative for congestion and sore throat.   Respiratory: Negative.  Negative for cough and shortness of breath.   Cardiovascular: Negative.  Negative for chest pain and palpitations.  Gastrointestinal:  Negative for abdominal pain, nausea and vomiting.  Skin: Negative.  Negative for rash.  All other systems reviewed and are negative.   Today's Vitals   09/30/24 1307  BP: 132/82  Pulse: 70  Temp: (!) 97.5 F (36.4 C)  TempSrc: Temporal  SpO2: 99%  Weight: 298 lb (135.2 kg)  Height: 5' 11 (1.803 m)   Body mass index is 41.56 kg/m.   Physical Exam Vitals reviewed.  Constitutional:      Appearance: Normal appearance.  HENT:     Head: Normocephalic.     Mouth/Throat:     Mouth: Mucous membranes are moist.     Pharynx: Oropharynx is clear.  Eyes:     Extraocular Movements: Extraocular movements intact.     Conjunctiva/sclera: Conjunctivae normal.     Pupils: Pupils are equal, round, and reactive to light.  Cardiovascular:     Rate and Rhythm: Normal rate and regular rhythm.     Pulses: Normal pulses.     Heart sounds: Normal heart sounds.  Pulmonary:     Effort: Pulmonary effort is normal.     Breath sounds: Normal breath sounds.  Abdominal:     Palpations: Abdomen is soft.     Tenderness: There is no abdominal tenderness.  Skin:    General: Skin is warm and dry.     Capillary Refill: Capillary refill takes less than 2 seconds.  Neurological:     General: No focal deficit present.     Mental Status: He is alert and oriented to person, place, and time.  Psychiatric:        Mood and Affect: Mood normal.        Behavior: Behavior normal.    Results for orders placed or performed in visit on 09/30/24 (from the past 24  hours)  POCT glycosylated hemoglobin (Hb A1C)     Status: Abnormal   Collection Time: 09/30/24  1:27 PM  Result Value Ref Range   Hemoglobin A1C 6.6 (A) 4.0 - 5.6 %   HbA1c POC (<> result, manual entry)     HbA1c, POC (prediabetic range)     HbA1c, POC (controlled diabetic range)       ASSESSMENT & PLAN: A total of 40 minutes was spent with the patient and counseling/coordination of care regarding preparing for this visit, review of most recent office visit notes, review of multiple chronic medical conditions and their management, cardiovascular risks associated with hypertension and diabetes, review of all medications, review of most recent bloodwork results including interpretation of today's hemoglobin A1c, review of health maintenance items, education on nutrition, prognosis, documentation, and need for follow up.   Problem List Items Addressed This Visit       Cardiovascular and Mediastinum   Hypertension associated with diabetes (HCC) - Primary   BP Readings from Last 3 Encounters:  09/30/24 132/82  07/01/24 (!) 122/56  06/25/24 122/72  Well-controlled hypertension Continue olmesartan  20 mg daily Well-controlled diabetes with hemoglobin A1c at 6.6 Continues Trulicity  1.5 mg weekly Recommend to continue glipizide  5 mg twice a day Cardiovascular risks associated with uncontrolled diabetes discussed Diet and nutrition discussed Follow-up in 3 months       Relevant Orders   POCT glycosylated hemoglobin (Hb A1C) (Completed)     Digestive   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 Stable. No problems  Saw his oncologist for annual exam recently.  No concerns. Not getting any treatment at present time        Endocrine   Dyslipidemia associated with type 2 diabetes mellitus (HCC)   Well-controlled diabetes with hemoglobin A1c at 6.6 Continue weekly Trulicity  1.5 mg Continue glipizide  5 mg twice a day Continue rosuvastatin  10 mg daily Diet and  nutrition discussed Follow-up in 3 months  Relevant Orders   POCT glycosylated hemoglobin (Hb A1C) (Completed)     Other   Morbid obesity (HCC)   Diet and nutrition discussed Cardiovascular risks associated with obesity discussed Advised to decrease amount of daily carbohydrate intake and daily calories and increase amount of plant based protein in his diet Presently on Trulicity  1.5 mg weekly      Patient Instructions  Diabetes: Carbohydrate Counting for Adults Carbohydrate counting is a method of keeping track of how many carbohydrates you eat. Eating carbohydrates increases the amount of sugar, also called glucose, in your blood. By counting how many carbohydrates you eat, you can improve how well you manage your blood sugar. This, in turn, helps you manage your diabetes. Carbohydrates are measured in grams (g) per serving. It's important to know how many carbohydrates (in grams or by serving size) you can have in each meal. This is different for every person. A dietitian can help you make a meal plan and calculate how many carbohydrates you should have at each meal and snack. What foods contain carbohydrates? Carbohydrates are found in these foods: Grains, such as breads and cereals. Dried beans and soy products. Starchy vegetables, such as potatoes, peas, and corn. Fruit and fruit juices. Milk and yogurt. Sweets and snack foods like cake, cookies, candy, chips, and soft drinks. How do I count carbohydrates in foods? There are two ways to count carbohydrates in food. You can read food labels or learn standard serving sizes of foods. You can use either of these methods or a combination of both. Using the Nutrition Facts label The Nutrition Facts list is included on the labels of almost all packaged foods and drinks in the [place] It includes: The serving size. Information about nutrients in each serving. This includes the grams of carbohydrate per serving. To use the Nutrition  Facts, decide how many servings you will have. Then, multiply the number of servings by the number of carbohydrates per serving. The resulting number is the total grams of carbohydrates that you'll be having. Learning the standard serving sizes of foods When you eat carbohydrate foods that aren't packaged or don't include Nutrition Facts on the label, you need to measure the servings in order to count the grams of carbohydrates. Measure the foods that you'll eat with a food scale or measuring cup, if needed. Decide how many standard-size servings you'll eat. Multiply the number of servings by 15. For foods that contain carbohydrates, one serving equals 15 g of carbohydrates. For example, if you eat 2 cups or 10 oz (300 g) of strawberries, you'll have eaten 2 servings and 30 g of carbohydrates (2 servings x 15 g = 30 g). For foods that have more than one food mixed, such as soups and casseroles, you must count the carbohydrates in each food that's included. Here's a list of standard serving sizes for common carbohydrate-rich foods. Each of these servings has about 15 g of carbohydrates: 1 slice of bread. 1 six-inch (15 cm) tortilla. ? cup or 2 oz (53 g) of cooked rice or pasta.  cup or 3 oz (85 g) of cooked or canned, drained, and rinsed beans or lentils.  cup or 3 oz (85 g) of a starchy vegetable, such as peas, corn, or squash.  cup or 4 oz (120 g) of hot cereal.  cup or 3 oz (85 g) of boiled or mashed potatoes, or  or 3 oz (85 g) of a large baked potato.  cup or 4 fl oz (118  mL) of fruit juice. 1 cup or 8 fl oz (237 mL) of milk. 1 small or 4 oz (106 g) apple.  or 2 oz (63 g) of a medium banana. 1 cup or 5 oz (150 g) of strawberries. 3 cups or 1 oz (28.3 g) of popped popcorn. What is an example of carbohydrate counting? To calculate the grams of carbohydrates in this sample meal, follow the steps below. Sample meal 3 oz (85 g) chicken breast. ? cup or 4 oz (106 g) of brown rice.   cup or 3 oz (85 g) of corn. 1 cup or 8 fl oz (237 mL) of milk. 1 cup or 5 oz (150 g) of strawberries with sugar-free whipped topping. Carbohydrate calculation Identify the foods that have carbohydrates: Rice. Corn. Milk. Strawberries. Calculate how many servings you have of each food: 2 servings of rice. 1 serving of corn. 1 serving of milk. 1 serving of strawberries. Multiply each number of servings by 15 g: 2 servings of rice x 15 g = 30 g. 1 serving of corn x 15 g = 15 g. 1 serving of milk x 15 g = 15 g. 1 serving of strawberries x 15 g = 15 g. Add together all of the amounts to find the total grams of carbohydrates eaten: 30 g + 15 g + 15 g + 15 g = 75 g of carbohydrates total. Where to find more information To learn more, go to: American Diabetes Association at diabetes.org. Click Search and type carb counting. Find the link you need. Centers for Disease Control and Prevention at tonerpromos.no. Click Search and type diabetes. Find the link you need. Academy of Nutrition and Dietetics: eatright.org This information is not intended to replace advice given to you by your health care provider. Make sure you discuss any questions you have with your health care provider. Document Revised: 10/18/2023 Document Reviewed: 10/18/2023 Elsevier Patient Education  2025 Elsevier Inc.     Emil Schaumann, MD [facility]

## 2024-10-07 ENCOUNTER — Encounter: Payer: Self-pay | Admitting: Hematology

## 2024-11-08 ENCOUNTER — Encounter: Payer: Self-pay | Admitting: Hematology

## 2024-11-08 ENCOUNTER — Ambulatory Visit
Admission: EM | Admit: 2024-11-08 | Discharge: 2024-11-08 | Disposition: A | Discharge status: Home / Self Care | Attending: Emergency Medicine | Admitting: Emergency Medicine

## 2024-11-08 DIAGNOSIS — S91051A Open bite, right ankle, initial encounter: Secondary | ICD-10-CM

## 2024-11-08 DIAGNOSIS — Z23 Encounter for immunization: Secondary | ICD-10-CM

## 2024-11-08 DIAGNOSIS — W5501XA Bitten by cat, initial encounter: Secondary | ICD-10-CM | POA: Diagnosis not present

## 2024-11-08 DIAGNOSIS — J069 Acute upper respiratory infection, unspecified: Secondary | ICD-10-CM | POA: Diagnosis not present

## 2024-11-08 DIAGNOSIS — L03115 Cellulitis of right lower limb: Secondary | ICD-10-CM

## 2024-11-08 DIAGNOSIS — Z203 Contact with and (suspected) exposure to rabies: Secondary | ICD-10-CM

## 2024-11-08 MED ORDER — RABIES VIRUS VACCINE, HDC IM SUSR
1.0000 mL | Freq: Once | INTRAMUSCULAR | Status: AC
  Administered 2024-11-08: 1 mL via INTRAMUSCULAR

## 2024-11-08 MED ORDER — TETANUS-DIPHTH-ACELL PERTUSSIS 5-2-15.5 LF-MCG/0.5 IM SUSP
0.5000 mL | Freq: Once | INTRAMUSCULAR | Status: AC
  Administered 2024-11-08: 0.5 mL via INTRAMUSCULAR

## 2024-11-08 MED ORDER — AMOXICILLIN-POT CLAVULANATE 875-125 MG PO TABS: 1.0000 | ORAL_TABLET | Freq: Two times a day (BID) | ORAL | 0 refills | Status: AC

## 2024-11-08 MED ORDER — RABIES IMMUNE GLOBULIN 300 UNIT/2ML IJ SOLN
20.0000 [IU]/kg | Freq: Once | INTRAMUSCULAR | Status: AC
  Administered 2024-11-08: 2700 [IU] via INTRAMUSCULAR

## 2024-11-08 NOTE — Discharge Instructions (Addendum)
 Take the Augmentin  as directed.    Keep your wound clean and dry.  Wash it gently twice a day with soap and water .  Then apply an antibiotic ointment and bandage.    Follow-up right away if you see signs of worsening infection such as fever or pus or increased redness.  Your tetanus was updated today.     In addition to the human rabies immune globulin  (HRIG), you were given the first dose of the rabies vaccine today (Day 0).  Please return here on the following dates to complete the rabies series: Day 3:11/11/2024 Day 7: 11/15/2024 Day 14:11/22/2024     Follow up with your primary care provider on Monday.  Go to the emergency department if you have worsening symptoms.

## 2024-11-08 NOTE — ED Triage Notes (Signed)
 Cough, runny nose x 4 days.   Pt states he also got bit by a stray cat on his right ankle 2 days ago.

## 2024-11-08 NOTE — ED Provider Notes (Signed)
 " CAY RALPH PELT    CSN: [id number] Arrival date & time: 11/08/24  1021      History   Chief Complaint Chief Complaint  Patient presents with   Cough   Animal Bite    HPI [name] is a 73 y.o. male.  Accompanied by his wife, patient presents with a cat bite on his right ankle that occurred on 11/06/2024.  The cat was a stray that was trying to get in his garage.  He used his foot to shoe the cat away and it grabbed onto his ankle with teeth and clogs.  Vaccination status of cat is unknown.  He has been cleaning the wound but his skin is getting red around it.  No fever, chills, purulent drainage.  Patient reports his last tetanus 2018.  Patient also presents with congestion and cough x 4 days.  No shortness of breath.  His medical history includes gastric adenocarcinoma in 2018, diabetes, hypertension, dyslipidemia, morbid obesity, diabetic polyneuropathy.  Patient states he is not on any immunocompromising medications.  The history is provided by the patient and medical records.    Past Medical History:  Diagnosis Date   Anemia    Cancer (HCC) dx'd 11/2016   Gastric Adenocarcinoma   Gastric outlet obstruction 01/2017   GERD (gastroesophageal reflux disease)    Pre-diabetes    denies   SVT (supraventricular tachycardia)    during Admission and Surgery- 01/2017    Patient Active Problem List   Diagnosis Date Noted   Morbid obesity (HCC) 09/30/2024   Body mass index 40.0-44.9, adult (HCC) 06/25/2024   Diabetic polyneuropathy associated with type 2 diabetes mellitus (HCC) 06/15/2021   Hyperlipidemia with target LDL less than 100 06/15/2021   Dyslipidemia associated with type 2 diabetes mellitus (HCC) 06/15/2021   Hypertension associated with diabetes (HCC) 06/15/2021   Abnormal electrocardiogram (ECG) (EKG) 06/15/2021   Counseling regarding advance care planning and goals of care 08/13/2018   Port catheter in place 08/03/2017   GERD (gastroesophageal reflux  disease) 03/25/2017   Anemia 03/25/2017   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    Past Surgical History:  Procedure Laterality Date   CHOLECYSTECTOMY N/A 01/10/2017   Procedure: CHOLECYSTECTOMY;  Surgeon: Jina Nephew, MD;  Location: [facility] OR;  Service: General;  Laterality: N/A;   COLONOSCOPY     COLONOSCOPY  10/11/2011   Procedure: COLONOSCOPY;  Surgeon: [name] CHRISTELLA Hollingshead, MD;  Location: [facility];  Service: Endoscopy;  Laterality: N/A;  10:30 AM   ESOPHAGOGASTRODUODENOSCOPY N/A 12/30/2016   Procedure: ESOPHAGOGASTRODUODENOSCOPY (EGD);  Surgeon: Belvie Just, MD;  Location: [facility];  Service: Endoscopy;  Laterality: N/A;   GASTRECTOMY N/A 01/10/2017   Procedure: DISTAL GASTRECTOMY, JEJUNUM  FEEDING TUBE PLACEMENT;  Surgeon: Jina Nephew, MD;  Location: [facility] OR;  Service: General;  Laterality: N/A;   IR GENERIC HISTORICAL  01/24/2017   IR REPLC DUODEN/JEJUNO TUBE PERCUT W/FLUORO 01/24/2017 Marcey Moan, MD [facility]   IR [facility] DUODEN/JEJUNO TUBE PERCUT W/FLUORO  03/26/2017   PORTACATH PLACEMENT N/A 02/23/2017   Procedure: INSERTION PORT-A-CATH;  Surgeon: Jina Nephew, MD;  Location: [facility] OR;  Service: General;  Laterality: N/A;   testicle removed  1982       Home Medications    Prior to Admission medications  Medication Sig Start Date End Date Taking? Authorizing Provider  amoxicillin -clavulanate (AUGMENTIN ) 875-125 MG tablet Take 1 tablet by mouth every 12 (twelve) hours. 11/08/24  Yes Corlis Burnard DEL, NP  ferrous sulfate  325 (  65 FE) MG tablet Take 325 mg by mouth daily with breakfast.   Yes [provider]  glipiZIDE  (GLUCOTROL ) 5 MG tablet Take 1 tablet (5 mg total) by mouth 2 (two) times daily before a meal. 06/25/24  Yes Sagardia, Emil Schanz, MD  Multiple Vitamin (MULTIVITAMIN) tablet Take 1 tablet by mouth daily.   Yes [provider]  olmesartan  (BENICAR ) 20 MG tablet Take 1 tablet (20 mg total) by mouth daily. 03/20/24  Yes Sagardia, Emil Schanz, MD  rosuvastatin  (CRESTOR ) 10 MG tablet Take 1 tablet (10 mg total) by mouth daily. 03/20/24  Yes Sagardia, Emil Schanz, MD  TRULICITY  1.5 MG/0.5ML [facility] INJECT THE CONTENTS OF ONE PEN UNDER THE SKIN WEEKLY ON THE SAME DAY EACH WEEK 09/16/24  Yes Sagardia, Emil Schanz, MD  ketoconazole  (NIZORAL ) 2 % cream Apply 1 Application topically daily. 09/21/23   Purcell Emil Schanz, MD    Family History Family History  Problem Relation Age of Onset   Prostate cancer Father    Colon cancer Neg Hx     Social History Social History[1]   Allergies   No known allergies   Review of Systems Review of Systems  Constitutional:  Negative for chills and fever.  HENT:  Positive for congestion. Negative for ear pain and sore throat.   Respiratory:  Positive for cough. Negative for shortness of breath.   Musculoskeletal:  Positive for joint swelling. Negative for gait problem.  Skin:  Positive for color change and wound.  Neurological:  Negative for weakness and numbness.     Physical Exam Triage Vital Signs ED Triage Vitals  Encounter Vitals Group     BP 11/08/24 1137 116/72     Girls Systolic BP Percentile --      Girls Diastolic BP Percentile --      Boys Systolic BP Percentile --      Boys Diastolic BP Percentile --      Pulse Rate 11/08/24 1137 84     Resp 11/08/24 1137 18     Temp 11/08/24 1137 98.7 F (37.1 C)     Temp Source 11/08/24 1137 Oral     SpO2 11/08/24 1137 98 %     Weight --      Height --      Head Circumference --      Peak Flow --      Pain Score 11/08/24 1131 3     Pain Loc --      Pain Education --      Exclude from Growth Chart --    No data found.  Updated Vital Signs BP 116/72 (BP Location: Right Arm)   Pulse 84   Temp 98.7 F (37.1 C) (Oral)   Resp 18   Wt 293 lb 9.6 oz (133.2 kg)   SpO2 98%   BMI 40.95 kg/m   Visual Acuity Right Eye Distance:   Left Eye Distance:   Bilateral Distance:    Right Eye Near:   Left Eye Near:    Bilateral  Near:     Physical Exam Constitutional:      General: He is not in acute distress. HENT:     Right Ear: Tympanic membrane normal.     Left Ear: Tympanic membrane normal.     Nose: Nose normal.     Mouth/Throat:     Mouth: Mucous membranes are moist.     Pharynx: Oropharynx is clear.  Cardiovascular:     Rate and Rhythm: Normal rate  and regular rhythm.     Heart sounds: Normal heart sounds.  Pulmonary:     Effort: Pulmonary effort is normal. No respiratory distress.     Breath sounds: Normal breath sounds.  Musculoskeletal:        General: Swelling and tenderness present. No deformity. Normal range of motion.  Skin:    General: Skin is warm and dry.     Capillary Refill: Capillary refill takes less than 2 seconds.     Findings: Erythema and lesion present.     Comments: Scabbed wounds on right ankle with surrounding erythema.  See picture.  Neurological:     General: No focal deficit present.     Mental Status: He is alert.     Sensory: No sensory deficit.     Motor: No weakness.     Gait: Gait normal.      UC Treatments / Results  Labs (all labs ordered are listed, but only abnormal results are displayed) Labs Reviewed - No data to display  EKG   Radiology No results found.  Procedures Procedures (including critical care time)  Medications Ordered in UC Medications  Tdap (ADACEL) injection 0.5 mL (0.5 mLs Intramuscular Given 11/08/24 1311)  rabies immune globulin  (HYPERRAB) injection 2,700 Units (2,700 Units Intramuscular Given 11/08/24 1253)  rabies vaccine, human diploid (IMOVAX) injection 1 mL (1 mL Intramuscular Given 11/08/24 1309)    Initial Impression / Assessment and Plan / UC Course  I have reviewed the triage vital signs and the nursing notes.  Pertinent labs & imaging results that were available during my care of the patient were reviewed by me and considered in my medical decision making (see chart for details).    Cellulitis of right ankle,  cat bite of right ankle, need for rabies prophylaxis, acute upper respiratory infection.  Afebrile and vital signs are stable.  Lungs are clear and O2 sat is 98% on room air.  Patient has an infected cat bite on his right ankle.  The cat was a stray that bit him while he shoot it away with his foot as it was trying to get into his garage.  He does not know the owner or vaccination status of the cat.  Rabies immunoglobulin infiltrated around wounds by me and remainder given intramuscular by RN.  Rabies vaccine #1 given today and patient instructed to return here for the remainder of the series on days 3, 7, 14.  Tetanus updated today.  Wound care instructions and signs of worsening infection discussed.  ED precautions discussed.  Instructed him to follow-up with his PCP on Monday.  Education provided on animal bite, rabies infection, rabies vaccine, rabies immunoglobulin.  He agrees to plan of care.  Final Clinical Impressions(s) / UC Diagnoses   Final diagnoses:  Cellulitis of right ankle  Cat bite of right ankle, initial encounter  Need for post exposure prophylaxis for rabies  Acute upper respiratory infection     Discharge Instructions      Take the Augmentin  as directed.    Keep your wound clean and dry.  Wash it gently twice a day with soap and water .  Then apply an antibiotic ointment and bandage.    Follow-up right away if you see signs of worsening infection such as fever or pus or increased redness.  Your tetanus was updated today.     In addition to the human rabies immune globulin  (HRIG), you were given the first dose of the rabies vaccine today (Day 0).  Please return here on the following dates to complete the rabies series: Day 3:11/11/2024 Day 7: 11/15/2024 Day 14:11/22/2024     Follow up with your primary care provider on Monday.  Go to the emergency department if you have worsening symptoms.        ED Prescriptions     Medication Sig Dispense Auth. Provider    amoxicillin -clavulanate (AUGMENTIN ) 875-125 MG tablet Take 1 tablet by mouth every 12 (twelve) hours. 14 tablet Corlis Burnard DEL, NP      PDMP not reviewed this encounter.    [1]  Social History Tobacco Use   Smoking status: Never   Smokeless tobacco: Never  Vaping Use   Vaping status: Unknown  Substance Use Topics   Alcohol use: No    Comment: occ beer or wine but none in last 6 months    Drug use: No     Corlis Burnard DEL, NP 11/08/24 1317  "

## 2024-11-11 ENCOUNTER — Ambulatory Visit
Admission: RE | Admit: 2024-11-11 | Discharge: 2024-11-11 | Disposition: A | Discharge status: Home / Self Care | Source: Ambulatory Visit | Attending: Emergency Medicine | Admitting: Emergency Medicine

## 2024-11-11 DIAGNOSIS — Z23 Encounter for immunization: Secondary | ICD-10-CM | POA: Diagnosis not present

## 2024-11-11 DIAGNOSIS — Z203 Contact with and (suspected) exposure to rabies: Secondary | ICD-10-CM | POA: Diagnosis not present

## 2024-11-11 MED ORDER — RABIES VIRUS VACCINE, HDC IM SUSR
1.0000 mL | Freq: Once | INTRAMUSCULAR | Status: AC
  Administered 2024-11-11: 1 mL via INTRAMUSCULAR

## 2024-11-11 NOTE — ED Triage Notes (Signed)
 Patient here for Day 3 of Rabies Vaccine. Patient voices no other complaints.Denies any adverse reaction to vaccine. Vaccine placed in left Deltoid.

## 2024-11-15 ENCOUNTER — Ambulatory Visit
Admission: RE | Admit: 2024-11-15 | Discharge: 2024-11-15 | Disposition: A | Discharge status: Home / Self Care | Source: Ambulatory Visit | Attending: Emergency Medicine | Admitting: Emergency Medicine

## 2024-11-15 DIAGNOSIS — Z23 Encounter for immunization: Secondary | ICD-10-CM

## 2024-11-15 DIAGNOSIS — Z203 Contact with and (suspected) exposure to rabies: Secondary | ICD-10-CM

## 2024-11-15 MED ORDER — RABIES VIRUS VACCINE, HDC IM SUSR
1.0000 mL | Freq: Once | INTRAMUSCULAR | Status: AC
  Administered 2024-11-15: 1 mL via INTRAMUSCULAR

## 2024-11-15 NOTE — ED Triage Notes (Signed)
 Patient here for Day 7 of rabies vaccine. Patient voices no other complaints at this time. Vaccine placed in right deltoid.

## 2024-11-22 ENCOUNTER — Ambulatory Visit
Admission: RE | Admit: 2024-11-22 | Discharge: 2024-11-22 | Disposition: A | Discharge status: Home / Self Care | Attending: Emergency Medicine

## 2024-11-22 DIAGNOSIS — Z23 Encounter for immunization: Secondary | ICD-10-CM

## 2024-11-22 DIAGNOSIS — Z203 Contact with and (suspected) exposure to rabies: Secondary | ICD-10-CM | POA: Diagnosis not present

## 2024-11-22 MED ORDER — RABIES VIRUS VACCINE, HDC IM SUSR
1.0000 mL | Freq: Once | INTRAMUSCULAR | Status: AC
  Administered 2024-11-22: 1 mL via INTRAMUSCULAR

## 2024-11-22 NOTE — ED Triage Notes (Signed)
 Patient presents for final rabies vaccine .  Denies any other complaints. Tolerated other vaccines well.

## 2024-11-28 ENCOUNTER — Encounter: Payer: Self-pay | Admitting: Hematology

## 2024-12-17 ENCOUNTER — Encounter: Payer: Self-pay | Admitting: Hematology

## 2024-12-19 ENCOUNTER — Other Ambulatory Visit: Payer: Self-pay | Admitting: Emergency Medicine

## 2024-12-19 DIAGNOSIS — E118 Type 2 diabetes mellitus with unspecified complications: Secondary | ICD-10-CM

## 2024-12-19 DIAGNOSIS — E1169 Type 2 diabetes mellitus with other specified complication: Secondary | ICD-10-CM

## 2024-12-19 DIAGNOSIS — I10 Essential (primary) hypertension: Secondary | ICD-10-CM

## 2024-12-19 DIAGNOSIS — E785 Hyperlipidemia, unspecified: Secondary | ICD-10-CM

## 2024-12-19 DIAGNOSIS — E1159 Type 2 diabetes mellitus with other circulatory complications: Secondary | ICD-10-CM

## 2024-12-31 ENCOUNTER — Ambulatory Visit: Admitting: Emergency Medicine

## 2025-06-06 ENCOUNTER — Ambulatory Visit

## 2025-07-07 ENCOUNTER — Ambulatory Visit: Admitting: Hematology

## 2025-07-07 ENCOUNTER — Other Ambulatory Visit
